# Patient Record
Sex: Male | Born: 1950
Health system: Southern US, Community
[De-identification: ages and names within clinical notes are randomized; demographics above are authoritative.]

## PROBLEM LIST (undated history)

## (undated) DIAGNOSIS — Z87442 Personal history of urinary calculi: Secondary | ICD-10-CM

## (undated) DIAGNOSIS — G709 Myoneural disorder, unspecified: Secondary | ICD-10-CM

## (undated) DIAGNOSIS — D649 Anemia, unspecified: Secondary | ICD-10-CM

## (undated) DIAGNOSIS — I10 Essential (primary) hypertension: Secondary | ICD-10-CM

## (undated) DIAGNOSIS — N400 Enlarged prostate without lower urinary tract symptoms: Secondary | ICD-10-CM

## (undated) DIAGNOSIS — F32A Depression, unspecified: Secondary | ICD-10-CM

## (undated) DIAGNOSIS — F329 Major depressive disorder, single episode, unspecified: Secondary | ICD-10-CM

## (undated) DIAGNOSIS — M1990 Unspecified osteoarthritis, unspecified site: Secondary | ICD-10-CM

## (undated) DIAGNOSIS — M25569 Pain in unspecified knee: Secondary | ICD-10-CM

## (undated) DIAGNOSIS — K219 Gastro-esophageal reflux disease without esophagitis: Secondary | ICD-10-CM

## (undated) DIAGNOSIS — F988 Other specified behavioral and emotional disorders with onset usually occurring in childhood and adolescence: Secondary | ICD-10-CM

## (undated) DIAGNOSIS — F419 Anxiety disorder, unspecified: Secondary | ICD-10-CM

## (undated) DIAGNOSIS — G4730 Sleep apnea, unspecified: Secondary | ICD-10-CM

## (undated) DIAGNOSIS — E119 Type 2 diabetes mellitus without complications: Secondary | ICD-10-CM

## (undated) HISTORY — DX: Other specified behavioral and emotional disorders with onset usually occurring in childhood and adolescence: F98.8

## (undated) HISTORY — PX: OTHER SURGICAL HISTORY: SHX169

## (undated) HISTORY — DX: Anxiety disorder, unspecified: F41.9

## (undated) HISTORY — PX: KNEE ARTHROSCOPY: SUR90

## (undated) HISTORY — PX: APPENDECTOMY: SHX54

## (undated) HISTORY — DX: Type 2 diabetes mellitus without complications: E11.9

## (undated) HISTORY — PX: JOINT REPLACEMENT: SHX530

## (undated) HISTORY — DX: Pain in unspecified knee: M25.569

## (undated) HISTORY — DX: Depression, unspecified: F32.A

## (undated) HISTORY — DX: Benign prostatic hyperplasia without lower urinary tract symptoms: N40.0

## (undated) HISTORY — PX: HERNIA REPAIR: SHX51

## (undated) HISTORY — PX: COLONOSCOPY W/ POLYPECTOMY: SHX1380

## (undated) HISTORY — DX: Sleep apnea, unspecified: G47.30

---
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
## 2018-02-09 LAB — HM COLONOSCOPY

## 2018-12-14 LAB — CBC AND DIFFERENTIAL
HCT: 41 (ref 41–53)
Hemoglobin: 14 (ref 13.5–17.5)
Platelets: 149 — AB (ref 150–399)
WBC: 12.1

## 2018-12-14 LAB — VITAMIN D 25 HYDROXY (VIT D DEFICIENCY, FRACTURES): Vit D, 25-Hydroxy: 20.1

## 2018-12-14 LAB — HM HIV SCREENING LAB: HM HIV Screening: NEGATIVE

## 2019-12-19 LAB — TSH: TSH: 2.21 (ref 0.41–5.90)

## 2019-12-19 LAB — BASIC METABOLIC PANEL
BUN: 27 — AB (ref 4–21)
CO2: 28 — AB (ref 13–22)
Chloride: 100 (ref 99–108)
Creatinine: 1.1 (ref 0.6–1.3)
Glucose: 140
Potassium: 4.1 (ref 3.4–5.3)
Sodium: 140 (ref 137–147)

## 2019-12-19 LAB — HEPATIC FUNCTION PANEL
ALT: 62 — AB (ref 10–40)
AST: 38 (ref 14–40)
Alkaline Phosphatase: 37 (ref 25–125)
Bilirubin, Total: 0.5

## 2019-12-19 LAB — LIPID PANEL
Cholesterol: 125 (ref 0–200)
HDL: 31 — AB (ref 35–70)
LDL Cholesterol: 60
Triglycerides: 353 — AB (ref 40–160)

## 2019-12-19 LAB — COMPREHENSIVE METABOLIC PANEL
Albumin: 4.6 (ref 3.5–5.0)
Calcium: 10.3 (ref 8.7–10.7)
GFR calc non Af Amer: 69

## 2019-12-19 LAB — MICROALBUMIN, URINE: Microalb, Ur: <0.7

## 2020-05-27 ENCOUNTER — Encounter: Payer: Self-pay | Admitting: Family Medicine

## 2020-05-27 ENCOUNTER — Ambulatory Visit (INDEPENDENT_AMBULATORY_CARE_PROVIDER_SITE_OTHER): Payer: Medicare Other | Admitting: Family Medicine

## 2020-05-27 ENCOUNTER — Other Ambulatory Visit: Payer: Self-pay

## 2020-05-27 VITALS — BP 120/63 | HR 76 | Temp 97.3°F | Resp 16 | Ht 71.0 in | Wt 314.2 lb

## 2020-05-27 DIAGNOSIS — E1136 Type 2 diabetes mellitus with diabetic cataract: Secondary | ICD-10-CM

## 2020-05-27 DIAGNOSIS — M25561 Pain in right knee: Secondary | ICD-10-CM

## 2020-05-27 DIAGNOSIS — M150 Primary generalized (osteo)arthritis: Secondary | ICD-10-CM

## 2020-05-27 DIAGNOSIS — M5136 Other intervertebral disc degeneration, lumbar region: Secondary | ICD-10-CM

## 2020-05-27 DIAGNOSIS — M8949 Other hypertrophic osteoarthropathy, multiple sites: Secondary | ICD-10-CM | POA: Diagnosis not present

## 2020-05-27 DIAGNOSIS — G8929 Other chronic pain: Secondary | ICD-10-CM

## 2020-05-27 DIAGNOSIS — M159 Polyosteoarthritis, unspecified: Secondary | ICD-10-CM | POA: Insufficient documentation

## 2020-05-27 DIAGNOSIS — E1169 Type 2 diabetes mellitus with other specified complication: Secondary | ICD-10-CM

## 2020-05-27 DIAGNOSIS — E785 Hyperlipidemia, unspecified: Secondary | ICD-10-CM

## 2020-05-27 DIAGNOSIS — Z794 Long term (current) use of insulin: Secondary | ICD-10-CM

## 2020-05-27 DIAGNOSIS — M25562 Pain in left knee: Secondary | ICD-10-CM | POA: Insufficient documentation

## 2020-05-27 DIAGNOSIS — Z87828 Personal history of other (healed) physical injury and trauma: Secondary | ICD-10-CM

## 2020-05-27 DIAGNOSIS — M51369 Other intervertebral disc degeneration, lumbar region without mention of lumbar back pain or lower extremity pain: Secondary | ICD-10-CM

## 2020-05-27 DIAGNOSIS — Z7689 Persons encountering health services in other specified circumstances: Secondary | ICD-10-CM

## 2020-05-27 NOTE — Progress Notes (Signed)
Subjective:    Patient ID: Eric Andrews, male    DOB: 02-May-1951, 69 y.o.   MRN: [id number]  Eric Andrews is a 69 y.o. male presenting on 05/27/2020 for Establish Care (Right knee painful-no strength in that leg)    HPI   Moved from [place] to [place] with wife and 3 daughters. He has retired.  Chronic Bilateral Knee Pain S/p L meniscus injury, he had an arthroscopic repair and removal of half of meniscus He is having advanced arthritis or complications on Left knee. Additionally gradual worsening Right knee pain, with osteoarthritis Chronic Right knee pain and weakness, but it is tolerable, and sometimes it can peak to a more severe pain and lasting a few. ALSO - Lumbar Spine L4-5 DDD / Osteoarthritis - He has had prior X-rays demonstrating this DDD. Taking Celebrex 200 BID Not on other NSAIDs He was taking Tylenol high doses, and it affected his liver, so he was reduced. He was followed by Orthopedic specialist for both knees and back Prior imaging and MRI of R knee.  - they recommended R knee surgery in past but he could not pursue this while he was working  CHRONIC DM, Type 2: Reports he has had chronic diabetes, was followed by Endocrinologist until 1 year ago. He has been stable on insulin medicines for 10+ years, he has been on different other short acting insulin. CBGs: no readings today Meds: Humalog TID SSI with meals, Lantus 76 units daily, Jardiance 25mg , Pioglitazone 15mg  - He would like to come off Jardiance due to cost. His A1c has been controlled Reports  good compliance. Tolerating well w/o side-effects Currently on ARB Denies hypoglycemia, polyuria, visual changes, numbness or tingling.   Health Maintenance:  Colon CA Screening: Last Colonoscopy done in [place], will request record, reportedly negative no polyps good for 10 years, was done 2 years ago aprox, no other fam history.    No flowsheet data found.  Past Medical History:  Diagnosis Date  . ADD  (attention deficit disorder)   . Anxiety   . Depression   . Diabetes mellitus without complication ([place])   . Enlarged prostate   . Knee pain   . Sleep apnea    Past Surgical History:  Procedure Laterality Date  . APPENDECTOMY    . HERNIA REPAIR     Left  . KNEE ARTHROSCOPY     Left   Social History   Socioeconomic History  . Marital status: Not on file    Spouse name: Not on file  . Number of children: Not on file  . Years of education: Not on file  . Highest education level: Not on file  Occupational History  . Not on file  Tobacco Use  . Smoking status: Never Smoker  . Smokeless tobacco: Never Used  Substance and Sexual Activity  . Alcohol use: Not Currently  . Drug use: Never  . Sexual activity: Not on file  Other Topics Concern  . Not on file  Social History Narrative  . Not on file   Social Determinants of Health   Financial Resource Strain:   . Difficulty of Paying Living Expenses:   Food Insecurity:   . Worried About Charity fundraiser in the Last Year:   . Arboriculturist in the Last Year:   Transportation Needs:   . Film/video editor (Medical):   Marland Kitchen Lack of Transportation (Non-Medical):   Physical Activity:   . Days of Exercise per Week:   .  Minutes of Exercise per Session:   Stress:   . Feeling of Stress :   Social Connections:   . Frequency of Communication with Friends and Family:   . Frequency of Social Gatherings with Friends and Family:   . Attends Religious Services:   . Active Member of Clubs or Organizations:   . Attends Archivist Meetings:   Marland Kitchen Marital Status:   Intimate Partner Violence:   . Fear of Current or Ex-Partner:   . Emotionally Abused:   Marland Kitchen Physically Abused:   . Sexually Abused:    Family History  Problem Relation Age of Onset  . Cancer Father        lung   Current Outpatient Medications on File Prior to Visit  Medication Sig  . aspirin 81 MG EC tablet Take by mouth.  . celecoxib (CELEBREX) 200 MG  capsule Take 200 mg by mouth 2 (two) times daily.  . cetirizine (ZYRTEC) 10 MG tablet Take by mouth.  . chlorthalidone (HYGROTON) 25 MG tablet Take 25 mg by mouth daily.  . cholecalciferol (VITAMIN D3) 25 MCG (1000 UNIT) tablet Take 1,000 Units by mouth daily.  . cyanocobalamin 1000 MCG tablet Take by mouth.  . fenofibrate micronized (LOFIBRA) 134 MG capsule Take by mouth.  . finasteride (PROSCAR) 5 MG tablet Take by mouth.  . insulin lispro (HUMALOG) 100 UNIT/ML injection Inject into the skin.  Marland Kitchen losartan (COZAAR) 50 MG tablet Take 50 mg by mouth daily.  . Omega-3 Fatty Acids (FISH OIL) 1000 MG CAPS Take by mouth in the morning and at bedtime.  . pioglitazone (ACTOS) 15 MG tablet Take 15 mg by mouth daily.  . propranolol (INDERAL) 20 MG tablet Take 20 mg by mouth daily.  . tamsulosin (FLOMAX) 0.4 MG CAPS capsule Take 0.4 mg by mouth.  . traZODone (DESYREL) 100 MG tablet Take 100 mg by mouth at bedtime.  Marland Kitchen venlafaxine XR (EFFEXOR-XR) 150 MG 24 hr capsule Take 150 mg by mouth daily with breakfast.  . atorvastatin (LIPITOR) 20 MG tablet Take 1 tablet (20 mg total) by mouth daily.  . hydrALAZINE (APRESOLINE) 100 MG tablet Take by mouth.  . insulin glargine (LANTUS) 100 UNIT/ML injection Inject into the skin.  . metFORMIN (GLUCOPHAGE-XR) 500 MG 24 hr tablet Take 2 tablets (1,000 mg total) by mouth daily with breakfast.  . SUMAtriptan (IMITREX) 100 MG tablet Take by mouth.   No current facility-administered medications on file prior to visit.    Review of Systems Per HPI unless specifically indicated above     Objective:    BP 120/63   Pulse 76   Temp (!) 97.3 F (36.3 C) (Temporal)   Resp 16   Ht 5\' 11"  (1.803 m)   Wt (!) 314 lb 3.2 oz (142.5 kg)   SpO2 98%   BMI 43.82 kg/m   Wt Readings from Last 3 Encounters:  05/27/20 (!) 314 lb 3.2 oz (142.5 kg)    Physical Exam Vitals and nursing note reviewed.  Constitutional:      General: He is not in acute distress.    Appearance:  He is well-developed. He is obese. He is not diaphoretic.     Comments: Well-appearing, comfortable, cooperative  HENT:     Head: Normocephalic and atraumatic.  Eyes:     General:        Right eye: No discharge.        Left eye: No discharge.     Conjunctiva/sclera: Conjunctivae normal.  Cardiovascular:  Rate and Rhythm: Normal rate.  Pulmonary:     Effort: Pulmonary effort is normal.  Skin:    General: Skin is warm and dry.     Findings: No erythema or rash.  Neurological:     Mental Status: He is alert and oriented to person, place, and time.  Psychiatric:        Behavior: Behavior normal.     Comments: Well groomed, good eye contact, normal speech and thoughts        No results found for this or any previous visit.    Assessment & Plan:   Problem List Items Addressed This Visit    Type 2 diabetes mellitus with other specified complication ([place]) - Primary   Relevant Medications   aspirin 81 MG EC tablet   insulin glargine (LANTUS) 100 UNIT/ML injection   insulin lispro (HUMALOG) 100 UNIT/ML injection   losartan (COZAAR) 50 MG tablet   pioglitazone (ACTOS) 15 MG tablet   metFORMIN (GLUCOPHAGE-XR) 500 MG 24 hr tablet   atorvastatin (LIPITOR) 20 MG tablet   Primary osteoarthritis involving multiple joints   Relevant Medications   aspirin 81 MG EC tablet   celecoxib (CELEBREX) 200 MG capsule   Other Relevant Orders   Ambulatory referral to Orthopedic Surgery   History of torn meniscus of left knee   Relevant Orders   Ambulatory referral to Orthopedic Surgery   DDD (degenerative disc disease), lumbar   Relevant Medications   aspirin 81 MG EC tablet   celecoxib (CELEBREX) 200 MG capsule   Other Relevant Orders   Ambulatory referral to Orthopedic Surgery   Chronic pain of right knee   Relevant Medications   aspirin 81 MG EC tablet   celecoxib (CELEBREX) 200 MG capsule   venlafaxine XR (EFFEXOR-XR) 150 MG 24 hr capsule   traZODone (DESYREL) 100 MG tablet    Other Relevant Orders   Ambulatory referral to Orthopedic Surgery   Cataract associated with type 2 diabetes mellitus (HCC)   Relevant Medications   aspirin 81 MG EC tablet   insulin glargine (LANTUS) 100 UNIT/ML injection   insulin lispro (HUMALOG) 100 UNIT/ML injection   losartan (COZAAR) 50 MG tablet   pioglitazone (ACTOS) 15 MG tablet   metFORMIN (GLUCOPHAGE-XR) 500 MG 24 hr tablet   atorvastatin (LIPITOR) 20 MG tablet    Other Visit Diagnoses    Encounter to establish care with new doctor          Request outside records from prior PCP and specialist from [place].  Update all meds Not due for reorder - he can notify us when ready  For DM Can HOLD Jardiance for now, continue insulin basal + bolus and Pioglitazone, asked him to look into Trulicity as option to see if cost/covered we can consider sample and start at next visit if interested - Due DM Foot / Eye - Request prior records, endocrinology if available. Future consider refer if indicated. - check A1c in 4 weeks.  For HLD Continue statin therapy, Fenofibrate, will request prior records and labs. Including lipid  #Orthopedic Bilateral R>L Knee Joint pain DJD, Back pain lumbar DDD L4-5 Continue Celebrex Advised may trial topical Voltaren OTC rx if indicated Refer to [facility] at this time, for consultation on R knee, may benefit form further injections or synvisc future reconsider surgery if indicated.  No orders of the defined types were placed in this encounter.    Follow up plan: Return in about 4 weeks (around 06/24/2020) for 4 week  follow-up Arthritis, Diabetes (Foot, Eye check), lab results.  Nobie Putnam, [facility] 05/27/2020, 4:27 PM

## 2020-05-27 NOTE — Patient Instructions (Addendum)
Thank you for coming to the office today.  Referral to Orthopedics for Right Knee Pain / Arthritis  They will call you within 1 week if not heard back you can call them to check status.  [facility] [place], [place]  [address] Phone: [phone]  TRY Topical Voltaren gel OTC as needed 2-3 times a day ONLY IF NEEDED  Keep taking Celebrex.  We will get your records from previous specialist.  If need medicines - call us and request meds.  DUE for FASTING BLOOD WORK (no food or drink after midnight before the lab appointment, only water or coffee without cream/sugar on the morning of)  SCHEDULE "Lab Only" visit in the morning at the clinic for lab draw in 3-4 WEEKS  - Make sure Lab Only appointment is at about 1 week before your next appointment, so that results will be available  For Lab Results, once available within 2-3 days of blood draw, you can can log in to MyChart online to view your results and a brief explanation. Also, we can discuss results at next follow-up visit.    Please schedule a Follow-up Appointment to: Return in about 4 weeks (around 06/24/2020) for 4 week follow-up Arthritis, Diabetes (Foot, Eye check), lab results.  If you have any other questions or concerns, please feel free to call the office or send a message through [place]. You may also schedule an earlier appointment if necessary.  Additionally, you may be receiving a survey about your experience at our office within a few days to 1 week by e-mail or mail. We value your feedback.  Eric Putnam, DO [place]

## 2020-05-28 ENCOUNTER — Encounter: Payer: Self-pay | Admitting: Family Medicine

## 2020-06-02 ENCOUNTER — Encounter: Payer: Self-pay | Admitting: Family Medicine

## 2020-06-02 NOTE — Addendum Note (Signed)
Addended by: [name] on: 06/02/2020 11:37 PM   Modules accepted: Orders

## 2020-06-06 ENCOUNTER — Encounter: Payer: Self-pay | Admitting: Family Medicine

## 2020-06-18 ENCOUNTER — Other Ambulatory Visit: Payer: Medicare Other

## 2020-06-18 ENCOUNTER — Telehealth: Payer: Self-pay

## 2020-06-18 ENCOUNTER — Other Ambulatory Visit: Payer: Self-pay

## 2020-06-18 DIAGNOSIS — E1169 Type 2 diabetes mellitus with other specified complication: Secondary | ICD-10-CM

## 2020-06-18 DIAGNOSIS — Z794 Long term (current) use of insulin: Secondary | ICD-10-CM | POA: Diagnosis not present

## 2020-06-18 DIAGNOSIS — E785 Hyperlipidemia, unspecified: Secondary | ICD-10-CM | POA: Diagnosis not present

## 2020-06-19 LAB — COMPREHENSIVE METABOLIC PANEL
AG Ratio: 2 (calc) (ref 1.0–2.5)
ALT: 61 U/L — ABNORMAL HIGH (ref 9–46)
AST: 39 U/L — ABNORMAL HIGH (ref 10–35)
Albumin: 4.1 g/dL (ref 3.6–5.1)
Alkaline phosphatase (APISO): 30 U/L — ABNORMAL LOW (ref 35–144)
BUN: 21 mg/dL (ref 7–25)
CO2: 30 mmol/L (ref 20–32)
Calcium: 9.3 mg/dL (ref 8.6–10.3)
Chloride: 100 mmol/L (ref 98–110)
Creat: 1.08 mg/dL (ref 0.70–1.25)
Globulin: 2.1 g/dL (calc) (ref 1.9–3.7)
Glucose, Bld: 206 mg/dL — ABNORMAL HIGH (ref 65–99)
Potassium: 4.2 mmol/L (ref 3.5–5.3)
Sodium: 138 mmol/L (ref 135–146)
Total Bilirubin: 0.6 mg/dL (ref 0.2–1.2)
Total Protein: 6.2 g/dL (ref 6.1–8.1)

## 2020-06-19 LAB — CBC WITH DIFFERENTIAL/PLATELET
Absolute Monocytes: 520 cells/uL (ref 200–950)
Basophils Absolute: 70 cells/uL (ref 0–200)
Basophils Relative: 1.4 %
Eosinophils Absolute: 310 cells/uL (ref 15–500)
Eosinophils Relative: 6.2 %
HCT: 49.9 % (ref 38.5–50.0)
Hemoglobin: 16 g/dL (ref 13.2–17.1)
Lymphs Abs: 1755 cells/uL (ref 850–3900)
MCH: 28.8 pg (ref 27.0–33.0)
MCHC: 32.1 g/dL (ref 32.0–36.0)
MCV: 89.7 fL (ref 80.0–100.0)
MPV: 10.9 fL (ref 7.5–12.5)
Monocytes Relative: 10.4 %
Neutro Abs: 2345 cells/uL (ref 1500–7800)
Neutrophils Relative %: 46.9 %
Platelets: 137 10*3/uL — ABNORMAL LOW (ref 140–400)
RBC: 5.56 10*6/uL (ref 4.20–5.80)
RDW: 13.4 % (ref 11.0–15.0)
Total Lymphocyte: 35.1 %
WBC: 5 10*3/uL (ref 3.8–10.8)

## 2020-06-19 LAB — HEMOGLOBIN A1C
Hgb A1c MFr Bld: 7.9 % of total Hgb — ABNORMAL HIGH (ref <5.7)
Mean Plasma Glucose: 180 (calc)
eAG (mmol/L): 10 (calc)

## 2020-06-19 LAB — LIPID PANEL
Cholesterol: 114 mg/dL (ref <200)
HDL: 34 mg/dL — ABNORMAL LOW (ref >=40)
LDL Cholesterol (Calc): 54 mg/dL (calc)
Non-HDL Cholesterol (Calc): 80 mg/dL (calc) (ref <130)
Total CHOL/HDL Ratio: 3.4 (calc) (ref <5.0)
Triglycerides: 181 mg/dL — ABNORMAL HIGH (ref <150)

## 2020-06-19 NOTE — Telephone Encounter (Signed)
Open in error

## 2020-06-21 DIAGNOSIS — Z9889 Other specified postprocedural states: Secondary | ICD-10-CM | POA: Diagnosis not present

## 2020-06-21 DIAGNOSIS — M170 Bilateral primary osteoarthritis of knee: Secondary | ICD-10-CM | POA: Diagnosis not present

## 2020-06-25 ENCOUNTER — Ambulatory Visit (INDEPENDENT_AMBULATORY_CARE_PROVIDER_SITE_OTHER): Payer: Medicare Other | Admitting: Family Medicine

## 2020-06-25 ENCOUNTER — Other Ambulatory Visit: Payer: Self-pay

## 2020-06-25 ENCOUNTER — Encounter: Payer: Self-pay | Admitting: Family Medicine

## 2020-06-25 VITALS — BP 131/70 | HR 64 | Temp 96.8°F | Resp 16 | Ht 71.0 in | Wt 315.8 lb

## 2020-06-25 DIAGNOSIS — F331 Major depressive disorder, recurrent, moderate: Secondary | ICD-10-CM | POA: Diagnosis not present

## 2020-06-25 DIAGNOSIS — E1136 Type 2 diabetes mellitus with diabetic cataract: Secondary | ICD-10-CM

## 2020-06-25 DIAGNOSIS — E1169 Type 2 diabetes mellitus with other specified complication: Secondary | ICD-10-CM

## 2020-06-25 DIAGNOSIS — M8949 Other hypertrophic osteoarthropathy, multiple sites: Secondary | ICD-10-CM | POA: Diagnosis not present

## 2020-06-25 DIAGNOSIS — F411 Generalized anxiety disorder: Secondary | ICD-10-CM | POA: Insufficient documentation

## 2020-06-25 DIAGNOSIS — M159 Polyosteoarthritis, unspecified: Secondary | ICD-10-CM

## 2020-06-25 DIAGNOSIS — Z794 Long term (current) use of insulin: Secondary | ICD-10-CM

## 2020-06-25 DIAGNOSIS — Z6841 Body Mass Index (BMI) 40.0 and over, adult: Secondary | ICD-10-CM

## 2020-06-25 MED ORDER — OZEMPIC (0.25 OR 0.5 MG/DOSE) 2 MG/1.5ML ~~LOC~~ SOPN: 0.2500 mg | PEN_INJECTOR | SUBCUTANEOUS | 0 refills | Status: DC

## 2020-06-25 NOTE — Progress Notes (Signed)
Subjective:    Patient ID: Eric Andrews, male    DOB: 1951-05-17, 69 y.o.   MRN: [id number]  Eric Andrews is a 69 y.o. male presenting on 06/25/2020 for Diabetes   HPI   Follow-up Chronic Bilateral Knee Pain / Osteoarthritis Interval update, he has seen [facility] on 06/21/20, reviewed chart, had x-rays performed and reviewed at that time, he pursued R knee cortisone injection at that time. - He has complicated L knee history s/p arthroscopic meniscus repair in past ALSO - Lumbar Spine L4-5 DDD / Osteoarthritis - He has had prior X-rays demonstrating this DDD. Taking Celebrex 200 BID Not on other NSAIDs Prior imaging and MRI of R knee.  - they recommended R knee surgery in past but he could not pursue this while he was working  CHRONIC DM, Type 2 Cataracts Morbid Obesity BMI >44 Last visit with me 05/27/20, discussed med options to change current treatment plan He has continued Jardiance 25mg  did not stop it. Contacted insurance and the Trulicity was higher cost due to deductible $50 - admits overeating at times. He swapped from Lantus to Toujeo Meds: Humalog TID SSI with meals, Toujeo 50 units daily, Jardiance 25mg , Pioglitazone 15mg  Reports  good compliance. Tolerating well w/o side-effects Currently on ARB Denies hypoglycemia, polyuria, visual changes, numbness or tingling.  HYPERLIPIDEMIA: Elevated LFTs elevated LFT, January 2021 results reviewed today when patient brought copy of labs Lab in 06/2020 still shows mild elevated AST ALT elevation Lab review shows mild elevated Triglycerides 180s - Currently taking Atorvastatin 20mg , Fenofibrate 134, Omega 3, tolerating well without side effects or myalgias   Health Maintenance:  Colon CA Screening: Last Colonoscopy done in [place] at [facility] - reviewed/scanned report, mild sigmoid diverticulosis, no polyps, done in 02/09/18, next due repeat 5 years, 02/2023    Depression screen PHQ 2/9 06/25/2020    Decreased Interest 3  Down, Depressed, Hopeless 3  PHQ - 2 Score 6  Altered sleeping 1  Tired, decreased energy 3  Change in appetite 3  Feeling bad or failure about yourself  3  Trouble concentrating 2  Moving slowly or fidgety/restless 1  Suicidal thoughts 0  PHQ-9 Score 19  Difficult doing work/chores Very difficult   GAD 7 : Generalized Anxiety Score 06/25/2020  Nervous, Anxious, on Edge 2  Control/stop worrying 3  Worry too much - different things 3  Trouble relaxing 3  Restless 1  Easily annoyed or irritable 1  Afraid - awful might happen 2  Total GAD 7 Score 15  Anxiety Difficulty Somewhat difficult     Social History   Tobacco Use  . Smoking status: Never Smoker  . Smokeless tobacco: Never Used  Substance Use Topics  . Alcohol use: Not Currently  . Drug use: Never    Review of Systems Per HPI unless specifically indicated above     Objective:    BP 131/70   Pulse 64   Temp (!) 96.8 F (36 C)   Resp 16   Ht 5\' 11"  (1.803 m)   Wt (!) 315 lb 12.8 oz (143.2 kg)   SpO2 99%   BMI 44.05 kg/m   Wt Readings from Last 3 Encounters:  06/25/20 (!) 315 lb 12.8 oz (143.2 kg)  05/27/20 (!) 314 lb 3.2 oz (142.5 kg)    Physical Exam Vitals and nursing note reviewed.  Constitutional:      General: He is not in acute distress.    Appearance: He is well-developed. He  is obese. He is not diaphoretic.     Comments: Well-appearing, comfortable, cooperative  HENT:     Head: Normocephalic and atraumatic.  Eyes:     General:        Right eye: No discharge.        Left eye: No discharge.     Conjunctiva/sclera: Conjunctivae normal.  Neck:     Thyroid: No thyromegaly.  Cardiovascular:     Rate and Rhythm: Normal rate and regular rhythm.     Heart sounds: Normal heart sounds. No murmur heard.   Pulmonary:     Effort: Pulmonary effort is normal. No respiratory distress.     Breath sounds: Normal breath sounds. No wheezing or rales.  Musculoskeletal:         General: Normal range of motion.     Cervical back: Normal range of motion and neck supple.  Lymphadenopathy:     Cervical: No cervical adenopathy.  Skin:    General: Skin is warm and dry.     Findings: No erythema or rash.  Neurological:     Mental Status: He is alert and oriented to person, place, and time.  Psychiatric:        Behavior: Behavior normal.     Comments: Well groomed, good eye contact, normal speech and thoughts      Diabetic Foot Exam - Simple   Simple Foot Form Diabetic Foot exam was performed with the following findings: Yes 06/25/2020 10:39 AM  Visual Inspection No deformities, no ulcerations, no other skin breakdown bilaterally: Yes Sensation Testing Intact to touch and monofilament testing bilaterally: Yes Pulse Check Posterior Tibialis and Dorsalis pulse intact bilaterally: Yes Comments      Results for orders placed or performed in visit on 06/18/20  Lipid panel  Result Value Ref Range   Cholesterol 114 <200 mg/dL   HDL 34 (L) > OR = 40 mg/dL   Triglycerides 181 (H) <150 mg/dL   LDL Cholesterol (Calc) 54 mg/dL (calc)   Total CHOL/HDL Ratio 3.4 <5.0 (calc)   Non-HDL Cholesterol (Calc) 80 <130 mg/dL (calc)  Comprehensive metabolic panel  Result Value Ref Range   Glucose, Bld 206 (H) 65 - 99 mg/dL   BUN 21 7 - 25 mg/dL   Creat 1.08 0.70 - 1.25 mg/dL   BUN/Creatinine Ratio NOT APPLICABLE 6 - 22 (calc)   Sodium 138 135 - 146 mmol/L   Potassium 4.2 3.5 - 5.3 mmol/L   Chloride 100 98 - 110 mmol/L   CO2 30 20 - 32 mmol/L   Calcium 9.3 8.6 - 10.3 mg/dL   Total Protein 6.2 6.1 - 8.1 g/dL   Albumin 4.1 3.6 - 5.1 g/dL   Globulin 2.1 1.9 - 3.7 g/dL (calc)   AG Ratio 2.0 1.0 - 2.5 (calc)   Total Bilirubin 0.6 0.2 - 1.2 mg/dL   Alkaline phosphatase (APISO) 30 (L) 35 - 144 U/L   AST 39 (H) 10 - 35 U/L   ALT 61 (H) 9 - 46 U/L  Hemoglobin A1c  Result Value Ref Range   Hgb A1c MFr Bld 7.9 (H) <5.7 % of total Hgb   Mean Plasma Glucose 180 (calc)   eAG  (mmol/L) 10.0 (calc)  CBC with Differential/Platelet  Result Value Ref Range   WBC 5.0 3.8 - 10.8 Thousand/uL   RBC 5.56 4.20 - 5.80 Million/uL   Hemoglobin 16.0 13.2 - 17.1 g/dL   HCT 49.9 38 - 50 %   MCV 89.7 80.0 - 100.0 fL  MCH 28.8 27.0 - 33.0 pg   MCHC 32.1 32.0 - 36.0 g/dL   RDW 13.4 11.0 - 15.0 %   Platelets 137 (L) 140 - 400 Thousand/uL   MPV 10.9 7.5 - 12.5 fL   Neutro Abs 2,345 1,500 - 7,800 cells/uL   Lymphs Abs 1,755 850 - 3,900 cells/uL   Absolute Monocytes 520 200 - 950 cells/uL   Eosinophils Absolute 310 15 - 500 cells/uL   Basophils Absolute 70 0 - 200 cells/uL   Neutrophils Relative % 46.9 %   Total Lymphocyte 35.1 %   Monocytes Relative 10.4 %   Eosinophils Relative 6.2 %   Basophils Relative 1.4 %      Assessment & Plan:   Problem List Items Addressed This Visit    Type 2 diabetes mellitus with other specified complication ([place]) - Primary    Nearly controlled, F6E 7.9 Complications - cataracts, hyperlipidemia, obesity, depression-  increases risk of future cardiovascular complications   Plan:  1. Discussion on med regimen today, ultimately advised my goal is to titrate him down or off insulin and onto newer med agents such as GLP1 - Start Ozempic sample 0.25mg  x 4 week then 0.5mg  x 2 week, demo reviewed, he has higher deductible otherwise should be covered, will order rx when 3-4 weeks in if he is doing well. He can notify office. - Continue Jardiance 25mg  daily, Pioglitazone 15mg  daily, Metformin XR 1000mg  BID (500 x 2) - Continue Insulin regimen Toujeo / SSI Humalog. He should REDUCE basal Toujeo down from 50 to 40 u daily now, at start of ozempic. Counseling he may go down approximately 10 per month as he increases Ozempic and if he is doing well. May reduce SSI according to sugars 2. Encourage improved lifestyle - low carb, low sugar diet, reduce portion size, continue improving regular exercise 3. Check CBG, bring log to next visit for review 4.  Continue ASA, ARB, Statin 5. DM Foot exam done today / Advised to schedule DM ophtho exam, send record 6. Follow-up 3 months       Relevant Medications   OZEMPIC, 0.25 OR 0.5 MG/DOSE, 2 MG/1.5ML SOPN   empagliflozin (JARDIANCE) 25 MG TABS tablet   Primary osteoarthritis involving multiple joints    Followed by [facility] Recent R knee injection Keep following up with them for ortho concerns including lumbar spine      Morbid obesity with BMI of 40.0-44.9, adult (HCC)    Encourage weight loss      Relevant Medications   OZEMPIC, 0.25 OR 0.5 MG/DOSE, 2 MG/1.5ML SOPN   empagliflozin (JARDIANCE) 25 MG TABS tablet   Major depressive disorder, recurrent, moderate (HCC)    Chronic problems. Episodic worsening, chronic recurrent moderate Elevated PHQ/GAD scores today Continue current plan with Venlafaxine, Trazodone Follow-up sooner if interested in adjusting mood medication, we can consider mental health referral as well      GAD (generalized anxiety disorder)   Cataract associated with type 2 diabetes mellitus ([place])    Complication of P3IR Advised to schedule DM Eye exam, handout printed      Relevant Medications   OZEMPIC, 0.25 OR 0.5 MG/DOSE, 2 MG/1.5ML SOPN   empagliflozin (JARDIANCE) 25 MG TABS tablet      Meds ordered this encounter  Medications  . OZEMPIC, 0.25 OR 0.5 MG/DOSE, 2 MG/1.5ML SOPN    Sig: Inject 0.1875 mLs (0.25 mg total) into the skin once a week. For first 4 weeks. Then increase dose to 0.5mg  weekly  Dispense:  1 pen    Refill:  0      Follow up plan: Return in about 3 months (around 09/25/2020) for 3 month DM A1c, med adjust ozempic.   Nobie Putnam, [facility] 06/25/2020, 10:39 AM

## 2020-06-25 NOTE — Patient Instructions (Addendum)
Thank you for coming to the office today.  Call insurance find cost and coverage of the following   1. Ozempic (Semaglutide injection) - start 0.25mg  weekly for 4 weeks then increase to 0.5mg  weekly - This one has best benefit of weight loss and reducing Cardiovascular events   3 benefits - 1 significantly reduced A1c sugar, and may be able to reduce or stop metformin in future - 2 reduced appetite and weight loss with good results - 3 cardiovascular risk reduction, less likely to have heart attack/stroke   Free Sample Ozempic 6 week supply, first 4 weeks 0.25mg , then week 5-6 will be 0.5,   Call or send mychart messge in 1 month to get new rx Ozempic 0.5mg  weekly injection.  Reduce Toujeo insulin about 10 units from 50 to 40 when you start, then again every month if doing well by about 10 units.  You can reduce Humalog as you need based on sugars, will likely have to reduce in future.  We can see you in 3-4 months after to see how it is going  Recent Labs    06/18/20 0857  HGBA1C 7.9*     Your provider would like to you have your annual eye exam. Please contact your current eye doctor or here are some good options for you to contact.   [facility]   Address: [address] [place], [place] [address] Phone: [phone]  Website: [email or web address] [address], [place], [place] [address] Phone: [phone] [email or web address]  [facility]  Address: [place], [place], [place] [address] Phone: [phone]   [facility] [address] [place], [place] [address] Phone: [phone]  [facility] Address: [place], [place], [place] [address]  Phone: [phone]   Please schedule a Follow-up Appointment to: Return in about 3 months (around 09/25/2020) for 3 month DM A1c, med adjust ozempic.  If you have any other questions or concerns, please feel free to call the office or send a message through  [place]. You may also schedule an earlier appointment if necessary.  Additionally, you may be receiving a survey about your experience at our office within a few days to 1 week by e-mail or mail. We value your feedback.  Nobie Putnam, DO [place]

## 2020-06-26 DIAGNOSIS — Z6841 Body Mass Index (BMI) 40.0 and over, adult: Secondary | ICD-10-CM | POA: Insufficient documentation

## 2020-06-26 NOTE — Assessment & Plan Note (Signed)
Complication of M3VK Advised to schedule DM Eye exam, handout printed

## 2020-06-26 NOTE — Assessment & Plan Note (Addendum)
Chronic problems. Episodic worsening, chronic recurrent moderate Elevated PHQ/GAD scores today Continue current plan with Venlafaxine, Trazodone Follow-up sooner if interested in adjusting mood medication, we can consider mental health referral as well

## 2020-06-26 NOTE — Assessment & Plan Note (Signed)
Followed by [facility] Recent R knee injection Keep following up with them for ortho concerns including lumbar spine

## 2020-06-26 NOTE — Assessment & Plan Note (Signed)
Encourage weight loss. 

## 2020-06-26 NOTE — Assessment & Plan Note (Signed)
Nearly controlled, O4H 7.9 Complications - cataracts, hyperlipidemia, obesity, depression-  increases risk of future cardiovascular complications   Plan:  1. Discussion on med regimen today, ultimately advised my goal is to titrate him down or off insulin and onto newer med agents such as GLP1 - Start Ozempic sample 0.25mg  x 4 week then 0.5mg  x 2 week, demo reviewed, he has higher deductible otherwise should be covered, will order rx when 3-4 weeks in if he is doing well. He can notify office. - Continue Jardiance 25mg  daily, Pioglitazone 15mg  daily, Metformin XR 1000mg  BID (500 x 2) - Continue Insulin regimen Toujeo / SSI Humalog. He should REDUCE basal Toujeo down from 50 to 40 u daily now, at start of ozempic. Counseling he may go down approximately 10 per month as he increases Ozempic and if he is doing well. May reduce SSI according to sugars 2. Encourage improved lifestyle - low carb, low sugar diet, reduce portion size, continue improving regular exercise 3. Check CBG, bring log to next visit for review 4. Continue ASA, ARB, Statin 5. DM Foot exam done today / Advised to schedule DM ophtho exam, send record 6. Follow-up 3 months

## 2020-06-27 ENCOUNTER — Encounter: Payer: Self-pay | Admitting: Family Medicine

## 2020-07-01 ENCOUNTER — Other Ambulatory Visit: Payer: Self-pay | Admitting: Family Medicine

## 2020-07-01 DIAGNOSIS — R350 Frequency of micturition: Secondary | ICD-10-CM

## 2020-07-01 DIAGNOSIS — N401 Enlarged prostate with lower urinary tract symptoms: Secondary | ICD-10-CM

## 2020-07-01 MED ORDER — FINASTERIDE 5 MG PO TABS: 5.0000 mg | ORAL_TABLET | Freq: Every evening | ORAL | 3 refills | Status: DC

## 2020-07-01 NOTE — Telephone Encounter (Signed)
Requested medication (s) are due for refill today: yes  Requested medication (s) are on the active medication list: yes  Future visit scheduled: yes  Notes to clinic:  review for refill Last filled by historical provider   Requested Prescriptions  Pending Prescriptions Disp Refills   finasteride (PROSCAR) 5 MG tablet      Sig: Take 1 tablet (5 mg total) by mouth every evening.      Urology: 5-alpha Reductase Inhibitors Passed - 07/01/2020  3:25 PM      Passed - Valid encounter within last 12 months    Recent Outpatient Visits           6 days ago Type 2 diabetes mellitus with other specified complication, with long-term current use of insulin [facility])   [place], DO   1 month ago Type 2 diabetes mellitus with other specified complication, with long-term current use of insulin ([place])   [facility] [name], [name], DO       Future Appointments             In 1 week [facility], [facility]

## 2020-07-01 NOTE — Addendum Note (Signed)
Addended by: [name] on: 07/01/2020 05:59 PM   Modules accepted: Orders

## 2020-07-01 NOTE — Telephone Encounter (Signed)
PT need a refill  finasteride (PROSCAR) 5 MG tablet [[id number]]  [id number] - [address], [place] AT [facility] OF [address]  [place] [address]  Phone: [phone] Fax: [phone]

## 2020-07-01 NOTE — Addendum Note (Signed)
Addended by: Jefferson Fuel on: 07/01/2020 03:25 PM   Modules accepted: Orders

## 2020-07-09 ENCOUNTER — Ambulatory Visit (INDEPENDENT_AMBULATORY_CARE_PROVIDER_SITE_OTHER): Payer: Medicare Other

## 2020-07-09 VITALS — Ht 71.0 in | Wt 312.0 lb

## 2020-07-09 DIAGNOSIS — Z0000 Encounter for general adult medical examination without abnormal findings: Secondary | ICD-10-CM

## 2020-07-09 NOTE — Patient Instructions (Signed)
Eric Andrews , Thank you for taking time to come for your Medicare Wellness Visit. I appreciate your ongoing commitment to your health goals. Please review the following plan we discussed and let me know if I can assist you in the future.   Screening recommendations/referrals: Colonoscopy: completed 02/09/2018 Recommended yearly ophthalmology/optometry visit for glaucoma screening and checkup Recommended yearly dental visit for hygiene and checkup  Vaccinations: Influenza vaccine: due Pneumococcal vaccine: due Tdap vaccine: completed 2/10/20219 Shingles vaccine: discussed   Covid-19: 02/09/2020, 03/13/2020  Advanced directives: Advance directive discussed with you today. Even though you declined this today please call our office should you change your mind and we can give you the proper paperwork for you to fill out.   Conditions/risks identified: none  Next appointment: Follow up in one year for your annual wellness visit.   Preventive Care 68 Years and Older, Male Preventive care refers to lifestyle choices and visits with your health care provider that can promote health and wellness. What does preventive care include?  A yearly physical exam. This is also called an annual well check.  Dental exams once or twice a year.  Routine eye exams. Ask your health care provider how often you should have your eyes checked.  Personal lifestyle choices, including:  Daily care of your teeth and gums.  Regular physical activity.  Eating a healthy diet.  Avoiding tobacco and drug use.  Limiting alcohol use.  Practicing safe sex.  Taking low doses of aspirin every day.  Taking vitamin and mineral supplements as recommended by your health care provider. What happens during an annual well check? The services and screenings done by your health care provider during your annual well check will depend on your age, overall health, lifestyle risk factors, and family history of  disease. Counseling  Your health care provider may ask you questions about your:  Alcohol use.  Tobacco use.  Drug use.  Emotional well-being.  Home and relationship well-being.  Sexual activity.  Eating habits.  History of falls.  Memory and ability to understand (cognition).  Work and work Statistician. Screening  You may have the following tests or measurements:  Height, weight, and BMI.  Blood pressure.  Lipid and cholesterol levels. These may be checked every 5 years, or more frequently if you are over 13 years old.  Skin check.  Lung cancer screening. You may have this screening every year starting at age 68 if you have a 30-pack-year history of smoking and currently smoke or have quit within the past 15 years.  Fecal occult blood test (FOBT) of the stool. You may have this test every year starting at age 68.  Flexible sigmoidoscopy or colonoscopy. You may have a sigmoidoscopy every 5 years or a colonoscopy every 10 years starting at age 24.  Prostate cancer screening. Recommendations will vary depending on your family history and other risks.  Hepatitis C blood test.  Hepatitis B blood test.  Sexually transmitted disease (STD) testing.  Diabetes screening. This is done by checking your blood sugar (glucose) after you have not eaten for a while (fasting). You may have this done every 1-3 years.  Abdominal aortic aneurysm (AAA) screening. You may need this if you are a current or former smoker.  Osteoporosis. You may be screened starting at age 39 if you are at high risk. Talk with your health care provider about your test results, treatment options, and if necessary, the need for more tests. Vaccines  Your health care provider may  recommend certain vaccines, such as:  Influenza vaccine. This is recommended every year.  Tetanus, diphtheria, and acellular pertussis (Tdap, Td) vaccine. You may need a Td booster every 10 years.  Zoster vaccine. You may  need this after age 36.  Pneumococcal 13-valent conjugate (PCV13) vaccine. One dose is recommended after age 63.  Pneumococcal polysaccharide (PPSV23) vaccine. One dose is recommended after age 2. Talk to your health care provider about which screenings and vaccines you need and how often you need them. This information is not intended to replace advice given to you by your health care provider. Make sure you discuss any questions you have with your health care provider. Document Released: 12/20/2015 Document Revised: 08/12/2016 Document Reviewed: 09/24/2015 Elsevier Interactive Patient Education  2017 [place] Prevention in the Home Falls can cause injuries. They can happen to people of all ages. There are many things you can do to make your home safe and to help prevent falls. What can I do on the outside of my home?  Regularly fix the edges of walkways and driveways and fix any cracks.  Remove anything that might make you trip as you walk through a door, such as a raised step or threshold.  Trim any bushes or trees on the path to your home.  Use bright outdoor lighting.  Clear any walking paths of anything that might make someone trip, such as rocks or tools.  Regularly check to see if handrails are loose or broken. Make sure that both sides of any steps have handrails.  Any raised decks and porches should have guardrails on the edges.  Have any leaves, snow, or ice cleared regularly.  Use sand or salt on walking paths during winter.  Clean up any spills in your garage right away. This includes oil or grease spills. What can I do in the bathroom?  Use night lights.  Install grab bars by the toilet and in the tub and shower. Do not use towel bars as grab bars.  Use non-skid mats or decals in the tub or shower.  If you need to sit down in the shower, use a plastic, non-slip stool.  Keep the floor dry. Clean up any water that spills on the floor as soon as it  happens.  Remove soap buildup in the tub or shower regularly.  Attach bath mats securely with double-sided non-slip rug tape.  Do not have throw rugs and other things on the floor that can make you trip. What can I do in the bedroom?  Use night lights.  Make sure that you have a light by your bed that is easy to reach.  Do not use any sheets or blankets that are too big for your bed. They should not hang down onto the floor.  Have a firm chair that has side arms. You can use this for support while you get dressed.  Do not have throw rugs and other things on the floor that can make you trip. What can I do in the kitchen?  Clean up any spills right away.  Avoid walking on wet floors.  Keep items that you use a lot in easy-to-reach places.  If you need to reach something above you, use a strong step stool that has a grab bar.  Keep electrical cords out of the way.  Do not use floor polish or wax that makes floors slippery. If you must use wax, use non-skid floor wax.  Do not have throw rugs and  other things on the floor that can make you trip. What can I do with my stairs?  Do not leave any items on the stairs.  Make sure that there are handrails on both sides of the stairs and use them. Fix handrails that are broken or loose. Make sure that handrails are as long as the stairways.  Check any carpeting to make sure that it is firmly attached to the stairs. Fix any carpet that is loose or worn.  Avoid having throw rugs at the top or bottom of the stairs. If you do have throw rugs, attach them to the floor with carpet tape.  Make sure that you have a light switch at the top of the stairs and the bottom of the stairs. If you do not have them, ask someone to add them for you. What else can I do to help prevent falls?  Wear shoes that:  Do not have high heels.  Have rubber bottoms.  Are comfortable and fit you well.  Are closed at the toe. Do not wear sandals.  If you  use a stepladder:  Make sure that it is fully opened. Do not climb a closed stepladder.  Make sure that both sides of the stepladder are locked into place.  Ask someone to hold it for you, if possible.  Clearly mark and make sure that you can see:  Any grab bars or handrails.  First and last steps.  Where the edge of each step is.  Use tools that help you move around (mobility aids) if they are needed. These include:  Canes.  Walkers.  Scooters.  Crutches.  Turn on the lights when you go into a dark area. Replace any light bulbs as soon as they burn out.  Set up your furniture so you have a clear path. Avoid moving your furniture around.  If any of your floors are uneven, fix them.  If there are any pets around you, be aware of where they are.  Review your medicines with your doctor. Some medicines can make you feel dizzy. This can increase your chance of falling. Ask your doctor what other things that you can do to help prevent falls. This information is not intended to replace advice given to you by your health care provider. Make sure you discuss any questions you have with your health care provider. Document Released: 09/19/2009 Document Revised: 04/30/2016 Document Reviewed: 12/28/2014 Elsevier Interactive Patient Education  2017 [employer].

## 2020-07-09 NOTE — Progress Notes (Signed)
I connected with Eric Andrews today by telephone and verified that I am speaking with the correct person using two identifiers. Location patient: home Location provider: work Persons participating in the virtual visit: Ikenna Arnold, Glenna Durand LPN   I discussed the limitations, risks, security and privacy concerns of performing an evaluation and management service by telephone and the availability of in person appointments. I also discussed with the patient that there may be a patient responsible charge related to this service. The patient expressed understanding and verbally consented to this telephonic visit.    Interactive audio and video telecommunications were attempted between this provider and patient, however failed, due to patient having technical difficulties OR patient did not have access to video capability.  We continued and completed visit with audio only.    Vital signs may be patient reported or missing.   Subjective:   Eric Andrews is a 69 y.o. male who presents for Medicare Annual/Subsequent preventive examination.  Review of Systems     Cardiac Risk Factors include: advanced age (>9men, >1 women);diabetes mellitus;male gender;hypertension;obesity (BMI >30kg/m2);sedentary lifestyle     Objective:    Today's Vitals   07/09/20 0936 07/09/20 0937  Weight: (!) 312 lb (141.5 kg)   Height: 5\' 11"  (1.803 m)   PainSc:  1    Body mass index is 43.52 kg/m.  Advanced Directives 07/09/2020  Does Patient Have a Medical Advance Directive? No    Current Medications (verified) Outpatient Encounter Medications as of 07/09/2020  Medication Sig  . atorvastatin (LIPITOR) 20 MG tablet Take 1 tablet (20 mg total) by mouth daily.  . celecoxib (CELEBREX) 200 MG capsule Take 200 mg by mouth 2 (two) times daily.  . cetirizine (ZYRTEC) 10 MG tablet Take by mouth.  . chlorthalidone (HYGROTON) 25 MG tablet Take 25 mg by mouth daily.  . cholecalciferol (VITAMIN D3) 25 MCG (1000 UNIT)  tablet Take 1,000 Units by mouth every other day.  . empagliflozin (JARDIANCE) 25 MG TABS tablet Take 25 mg by mouth daily.  . fenofibrate micronized (LOFIBRA) 134 MG capsule Take by mouth.  . finasteride (PROSCAR) 5 MG tablet Take 1 tablet (5 mg total) by mouth every evening.  . hydrALAZINE (APRESOLINE) 100 MG tablet Take by mouth.  . Insulin Glargine (TOUJEO SOLOSTAR [place]) Inject 50 Units into the skin every evening.  . insulin lispro (HUMALOG) 100 UNIT/ML injection Inject 13 Units into the skin 2 (two) times daily with a meal.  . losartan (COZAAR) 50 MG tablet Take 50 mg by mouth daily.  . metFORMIN (GLUCOPHAGE-XR) 500 MG 24 hr tablet Take 1,000 mg by mouth 2 (two) times daily with a meal.  . Omega-3 Fatty Acids (FISH OIL) 1000 MG CAPS Take by mouth in the morning and at bedtime.  Marland Kitchen OZEMPIC, 0.25 OR 0.5 MG/DOSE, 2 MG/1.5ML SOPN Inject 0.1875 mLs (0.25 mg total) into the skin once a week. For first 4 weeks. Then increase dose to 0.5mg  weekly  . pioglitazone (ACTOS) 15 MG tablet Take 15 mg by mouth daily.  . propranolol (INDERAL) 20 MG tablet Take 20 mg by mouth daily.  . tamsulosin (FLOMAX) 0.4 MG CAPS capsule Take 0.4 mg by mouth at bedtime.  . traZODone (DESYREL) 100 MG tablet Take 100 mg by mouth at bedtime.  Marland Kitchen venlafaxine XR (EFFEXOR-XR) 150 MG 24 hr capsule Take 150 mg by mouth daily with breakfast.  . aspirin 81 MG EC tablet Take by mouth. (Patient not taking: Reported on 07/09/2020)  . cyanocobalamin 1000 MCG  tablet Take by mouth. (Patient not taking: Reported on 07/09/2020)  . SUMAtriptan (IMITREX) 100 MG tablet Take by mouth. (Patient not taking: Reported on 07/09/2020)   No facility-administered encounter medications on file as of 07/09/2020.    Allergies (verified) Dust mite extract   History: Past Medical History:  Diagnosis Date  . ADD (attention deficit disorder)   . Anxiety   . Depression   . Enlarged prostate   . Knee pain   . Sleep apnea    Past Surgical History:    Procedure Laterality Date  . APPENDECTOMY    . HERNIA REPAIR     Left  . KNEE ARTHROSCOPY     Left   Family History  Problem Relation Age of Onset  . Heart attack Mother 61  . Lung cancer Father   . Alzheimer's disease Brother   . Heart attack Maternal Grandfather 80   Social History   Socioeconomic History  . Marital status: Married    Spouse name: Not on file  . Number of children: Not on file  . Years of education: Not on file  . Highest education level: Not on file  Occupational History  . Occupation: retired  Tobacco Use  . Smoking status: Never Smoker  . Smokeless tobacco: Never Used  Vaping Use  . Vaping Use: Never used  Substance and Sexual Activity  . Alcohol use: Not Currently  . Drug use: Never  . Sexual activity: Not Currently  Other Topics Concern  . Not on file  Social History Narrative  . Not on file   Social Determinants of Health   Financial Resource Strain: Low Risk   . Difficulty of Paying Living Expenses: Not hard at all  Food Insecurity: No Food Insecurity  . Worried About Charity fundraiser in the Last Year: Never true  . Ran Out of Food in the Last Year: Never true  Transportation Needs: No Transportation Needs  . Lack of Transportation (Medical): No  . Lack of Transportation (Non-Medical): No  Physical Activity: Inactive  . Days of Exercise per Week: 0 days  . Minutes of Exercise per Session: 0 min  Stress: No Stress Concern Present  . Feeling of Stress : Not at all  Social Connections:   . Frequency of Communication with Friends and Family:   . Frequency of Social Gatherings with Friends and Family:   . Attends Religious Services:   . Active Member of Clubs or Organizations:   . Attends Archivist Meetings:   Marland Kitchen Marital Status:     Tobacco Counseling Counseling given: Not Answered   Clinical Intake:  Pre-visit preparation completed: Yes  Pain : 0-10 Pain Score: 1  Pain Type: Chronic pain Pain Location:  Generalized Pain Descriptors / Indicators: Aching Pain Onset: More than a month ago Pain Frequency: Constant Pain Relieving Factors: celebrex works  Pain Relieving Factors: celebrex works  Nutritional Status: BMI > 30  Obese Nutritional Risks: None Diabetes: Yes  How often do you need to have someone help you when you read instructions, pamphlets, or other written materials from your doctor or pharmacy?: 1 - Never What is the last grade level you completed in school?: college  Diabetic? Yes Nutrition Risk Assessment:  Has the patient had any N/V/D within the last 2 months?  No  Does the patient have any non-healing wounds?  No  Has the patient had any unintentional weight loss or weight gain?  No   Diabetes:  Is the patient diabetic?  Yes  If diabetic, was a CBG obtained today?  No  Did the patient bring in their glucometer from home?  No  How often do you monitor your CBG's? Twice daily.   Financial Strains and Diabetes Management:  Are you having any financial strains with the device, your supplies or your medication? No .  Does the patient want to be seen by Chronic Care Management for management of their diabetes?  No  Would the patient like to be referred to a Nutritionist or for Diabetic Management?  No   Diabetic Exams:  Diabetic Eye Exam: Overdue for diabetic eye exam. Pt has been advised about the importance in completing this exam. Patient advised to call and schedule an eye exam. Diabetic Foot Exam: Completed 06/25/2020   Interpreter Needed?: No  Information entered by :: NAllen LPN   Activities of Daily Living In your present state of health, do you have any difficulty performing the following activities: 07/09/2020 05/27/2020  Hearing? N N  Vision? N N  Difficulty concentrating or making decisions? Y Y  Comment trouble remembering -  Walking or climbing stairs? Y Y  Comment due to legs -  Dressing or bathing? N N  Doing errands, shopping? N N    Preparing Food and eating ? N -  Using the Toilet? N -  In the past six months, have you accidently leaked urine? N -  Do you have problems with loss of bowel control? N -  Managing your Medications? N -  Managing your Finances? N -  Housekeeping or managing your Housekeeping? N -    Patient Care Team: [name], DO as PCP - General (Family Medicine)  Indicate any recent Medical Services you may have received from other than [facility] providers in the past year (date may be approximate).     Assessment:   This is a routine wellness examination for Eric Andrews.  Hearing/Vision screen  Hearing Screening   125Hz  250Hz  500Hz  1000Hz  2000Hz  3000Hz  4000Hz  6000Hz  8000Hz   Right ear:           Left ear:           Vision Screening Comments: Regular eye exams. Dr. Ellin Mayhew  Dietary issues and exercise activities discussed: Current Exercise Habits: The patient does not participate in regular exercise at present  Goals    . Patient Stated     07/09/2020, wants to weigh 300 pounds      Depression Screen PHQ 2/9 Scores 07/09/2020 06/25/2020  PHQ - 2 Score 1 6  PHQ- 9 Score 13 19    Fall Risk Fall Risk  07/09/2020 06/25/2020  Falls in the past year? 1 0  Comment lost balance and trips -  Number falls in past yr: 1 0  Injury with Fall? 1 0  Risk for fall due to : History of fall(s);Impaired balance/gait;Impaired mobility;Medication side effect -  Follow up Falls evaluation completed;Education provided;Falls prevention discussed Falls evaluation completed    Any stairs in or around the home? Yes  If so, are there any without handrails? Yes  Home free of loose throw rugs in walkways, pet beds, electrical cords, etc? Yes  Adequate lighting in your home to reduce risk of falls? Yes   ASSISTIVE DEVICES UTILIZED TO PREVENT FALLS:  Life alert? No  Use of a cane, walker or w/c? No  Grab bars in the bathroom? No  Shower chair or bench in shower? No  Elevated toilet seat or a handicapped  toilet? No  TIMED UP AND GO:  Was the test performed? No .    Cognitive Function:     6CIT Screen 07/09/2020  What Year? 0 points  What month? 0 points  What time? 0 points  Count back from 20 0 points  Months in reverse 0 points  Repeat phrase 0 points  Total Score 0    Immunizations Immunization History  Administered Date(s) Administered  . Moderna SARS-COVID-2 Vaccination 02/09/2020, 03/13/2020  . Tdap 01/16/2018    TDAP status: Up to date Flu Vaccine status: Due Pneumococcal vaccine status: Due Covid-19 vaccine status: Completed vaccines  Qualifies for Shingles Vaccine? Yes   Zostavax completed No   Shingrix Completed?: No.    Education has been provided regarding the importance of this vaccine. Patient has been advised to call insurance company to determine out of pocket expense if they have not yet received this vaccine. Advised may also receive vaccine at local pharmacy or Health Dept. Verbalized acceptance and understanding.  Screening Tests Health Maintenance  Topic Date Due  . Hepatitis C Screening  Never done  . OPHTHALMOLOGY EXAM  Never done  . PNA vac Low Risk Adult (1 of 2 - PCV13) Never done  . INFLUENZA VACCINE  07/07/2020  . HEMOGLOBIN A1C  12/19/2020  . FOOT EXAM  06/25/2021  . COLONOSCOPY  02/10/2023  . TETANUS/TDAP  01/17/2028  . COVID-19 Vaccine  Completed    Health Maintenance  Health Maintenance Due  Topic Date Due  . Hepatitis C Screening  Never done  . OPHTHALMOLOGY EXAM  Never done  . PNA vac Low Risk Adult (1 of 2 - PCV13) Never done  . INFLUENZA VACCINE  07/07/2020    Colorectal cancer screening: Completed 02/09/2018. Repeat every 10 years  Lung Cancer Screening: (Low Dose CT Chest recommended if Age 32-80 years, 30 pack-year currently smoking OR have quit w/in 15years.) does not qualify.   Lung Cancer Screening Referral: no   Additional Screening:  Hepatitis C Screening: does qualify;   Vision Screening: Recommended  annual ophthalmology exams for early detection of glaucoma and other disorders of the eye. Is the patient up to date with their annual eye exam?  Yes  Who is the provider or what is the name of the office in which the patient attends annual eye exams? Dr. Ellin Mayhew If pt is not established with a provider, would they like to be referred to a provider to establish care? No .   Dental Screening: Recommended annual dental exams for proper oral hygiene  Community Resource Referral / Chronic Care Management: CRR required this visit?  No   CCM required this visit?  No      Plan:     I have personally reviewed and noted the following in the patient's chart:   . Medical and social history . Use of alcohol, tobacco or illicit drugs  . Current medications and supplements . Functional ability and status . Nutritional status . Physical activity . Advanced directives . List of other physicians . Hospitalizations, surgeries, and ER visits in previous 12 months . Vitals . Screenings to include cognitive, depression, and falls . Referrals and appointments  In addition, I have reviewed and discussed with patient certain preventive protocols, quality metrics, and best practice recommendations. A written personalized care plan for preventive services as well as general preventive health recommendations were provided to patient.     Kellie Simmering, LPN   12/12/1094   Nurse Notes: Patient is due for TDAP and Pneumonia  vaccines. Scheduled for eye exam this coming Friday.

## 2020-07-16 DIAGNOSIS — E119 Type 2 diabetes mellitus without complications: Secondary | ICD-10-CM | POA: Diagnosis not present

## 2020-07-16 DIAGNOSIS — H35372 Puckering of macula, left eye: Secondary | ICD-10-CM | POA: Diagnosis not present

## 2020-07-16 DIAGNOSIS — H2513 Age-related nuclear cataract, bilateral: Secondary | ICD-10-CM | POA: Diagnosis not present

## 2020-07-16 LAB — HM DIABETES EYE EXAM

## 2020-07-18 ENCOUNTER — Encounter: Payer: Self-pay | Admitting: Family Medicine

## 2020-07-31 ENCOUNTER — Other Ambulatory Visit: Payer: Self-pay | Admitting: Family Medicine

## 2020-07-31 DIAGNOSIS — Z794 Long term (current) use of insulin: Secondary | ICD-10-CM

## 2020-07-31 DIAGNOSIS — F331 Major depressive disorder, recurrent, moderate: Secondary | ICD-10-CM

## 2020-07-31 DIAGNOSIS — E785 Hyperlipidemia, unspecified: Secondary | ICD-10-CM

## 2020-07-31 DIAGNOSIS — E1169 Type 2 diabetes mellitus with other specified complication: Secondary | ICD-10-CM

## 2020-07-31 NOTE — Telephone Encounter (Signed)
Requested medications are due for refill today?   Unknown.  Trazodone listed as a historical medication.    Requested medications are on active medication list?  Yes - as a historical med.    Last Refill:   Unknown.    Future visit scheduled? No   Notes to Clinic:  Unable to refill historical medications.  Atorvastatin has already been filled.

## 2020-08-09 ENCOUNTER — Other Ambulatory Visit: Payer: Self-pay | Admitting: Family Medicine

## 2020-08-09 DIAGNOSIS — E1169 Type 2 diabetes mellitus with other specified complication: Secondary | ICD-10-CM

## 2020-08-09 DIAGNOSIS — Z794 Long term (current) use of insulin: Secondary | ICD-10-CM

## 2020-08-09 MED ORDER — OZEMPIC (0.25 OR 0.5 MG/DOSE) 2 MG/1.5ML ~~LOC~~ SOPN: 0.5000 mg | PEN_INJECTOR | SUBCUTANEOUS | 2 refills | Status: DC

## 2020-08-09 NOTE — Telephone Encounter (Signed)
Requested medication (s) are due for refill today: yes  Requested medication (s) are on the active medication list: yes   Last refill:  06/25/2020  Future visit scheduled: no  Notes to clinic:  Please verify dose and qty for refill    Requested Prescriptions  Pending Prescriptions Disp Refills   OZEMPIC, 0.25 OR 0.5 MG/DOSE, 2 MG/1.5ML SOPN      Sig: Inject 0.1875 mLs (0.25 mg total) into the skin once a week. For first 4 weeks. Then increase dose to 0.5mg  weekly      Endocrinology:  Diabetes - GLP-1 Receptor Agonists Passed - 08/09/2020 11:15 AM      Passed - HBA1C is between 0 and 7.9 and within 180 days    Hgb A1c MFr Bld  Date Value Ref Range Status  06/18/2020 7.9 (H) <5.7 % of total Hgb Final    Comment:    For someone without known diabetes, a hemoglobin A1c value of 6.5% or greater indicates that they may have  diabetes and this should be confirmed with a follow-up  test. . For someone with known diabetes, a value <7% indicates  that their diabetes is well controlled and a value  greater than or equal to 7% indicates suboptimal  control. A1c targets should be individualized based on  duration of diabetes, age, comorbid conditions, and  other considerations. . Currently, no consensus exists regarding use of hemoglobin A1c for diagnosis of diabetes for children. Renella Cunas - Valid encounter within last 6 months    Recent Outpatient Visits           1 month ago Type 2 diabetes mellitus with other specified complication, with long-term current use of insulin [facility])   [place], DO   2 months ago Type 2 diabetes mellitus with other specified complication, with long-term current use of insulin [facility])   [place], [name], DO

## 2020-08-09 NOTE — Telephone Encounter (Signed)
Medication Refill - Medication: ozempic   Has the patient contacted their pharmacy? Yes.   (Agent: If no, request that the patient contact the pharmacy for the refill.) (Agent: If yes, when and what did the pharmacy advise?)  Preferred Pharmacy (with phone number or street name):  [facility] [place], [address]  [place] [address]  Phone: [phone] Fax: [phone]  Hours: Not open 24 hours     Agent: Please be advised that RX refills may take up to 3 business days. We ask that you follow-up with your pharmacy.

## 2020-08-23 ENCOUNTER — Telehealth: Payer: Medicare Other | Admitting: Family Medicine

## 2020-09-02 ENCOUNTER — Encounter: Payer: Self-pay | Admitting: Family Medicine

## 2020-09-02 ENCOUNTER — Other Ambulatory Visit: Payer: Self-pay

## 2020-09-02 ENCOUNTER — Ambulatory Visit (INDEPENDENT_AMBULATORY_CARE_PROVIDER_SITE_OTHER): Payer: Medicare Other | Admitting: Family Medicine

## 2020-09-02 VITALS — BP 134/70 | HR 73 | Temp 97.6°F | Resp 16 | Ht 71.0 in | Wt 309.0 lb

## 2020-09-02 DIAGNOSIS — R0609 Other forms of dyspnea: Secondary | ICD-10-CM

## 2020-09-02 DIAGNOSIS — F411 Generalized anxiety disorder: Secondary | ICD-10-CM | POA: Diagnosis not present

## 2020-09-02 DIAGNOSIS — F331 Major depressive disorder, recurrent, moderate: Secondary | ICD-10-CM

## 2020-09-02 DIAGNOSIS — J984 Other disorders of lung: Secondary | ICD-10-CM | POA: Diagnosis not present

## 2020-09-02 DIAGNOSIS — R0600 Dyspnea, unspecified: Secondary | ICD-10-CM | POA: Diagnosis not present

## 2020-09-02 MED ORDER — BUSPIRONE HCL 5 MG PO TABS: 5.0000 mg | ORAL_TABLET | Freq: Two times a day (BID) | ORAL | 2 refills | Status: DC | PRN

## 2020-09-02 NOTE — Assessment & Plan Note (Signed)
Recent worsening anxiety See PHQ GAD scores MDD A&P Prior BDZ trial in past  Now will add Buspar to his SNRI - may take buspar 5mg  1-2 times a day PRN anxiety, future adjust accordingly May consider short term BDZ for panic attack or anxiety only, vs refer to Psych Anticipate will improve if breathing improved

## 2020-09-02 NOTE — Patient Instructions (Addendum)
Thank you for coming to the office today.  Referral sent to lung doctors / pulmonology  [facility] [address], [place], [place] Phone: [phone]  Stay tuned for apt, will likely need breathing test and imaging, may have Restrictive Lung Disease.  -----  For anxiety  Try new med Buspar 5mg  take 1 or 2 pills a day for anxiety AS NEEDED, can skip day if need, or can try regularly taking it.   Please schedule a Follow-up Appointment to: Return in about 5 weeks (around 10/07/2020) for DM A1c.  If you have any other questions or concerns, please feel free to call the office or send a message through [place]. You may also schedule an earlier appointment if necessary.  Additionally, you may be receiving a survey about your experience at our office within a few days to 1 week by e-mail or mail. We value your feedback.  Nobie Putnam, DO [place]

## 2020-09-02 NOTE — Progress Notes (Signed)
Subjective:    Patient ID: Eric Andrews, male    DOB: [birth year], 69 y.o.   MRN: [id number]  Eric Andrews is a 69 y.o. male presenting on 09/02/2020 for Shortness of Breath (breathlessness on exertion.and when gets stress ongoing from years but now getting worst denies Covid like Sxs did not prioritize with all other his past Hx but wants to be ckecked out now since it is getting worst)   HPI   Dyspnea on Exertion Restrictive Lung Disease, history Reports chronic problem for several years with shortness of breath with exertion often, he has had prior cardiac work up back in [place], with nuclear stress test reported negative and cardiac catheterization as well, reported that had "questionable" results but then cath showed no blockages.  - He has always had some difficulty with maintaining his breathing with sports or other activity and diving as kid. Previous employer, checked X-ray and he reportedly has PFTs from employer that was told showed "restrictive lung disease" - Today reports persistent issue with dyspnea on exertion still. Says worse with stressors. He has not had productive cough, or other sick symptoms. - Since he has retired he has been more active with physical work and yard work and exertion and he has been noticing these symptoms more often.  PMH - Type 2 Diabetes, Morbid Obesity  Major Depression, recurrent moderate Generalized Anxiety Disorder - Reports the ozempic reduces his food craving, but his anxiety often can make him stress eat. - He has worse time dealing with anxiety lately with family stressors with illness - His prior PCP inc dose Venlafaxine from 150 to 225 in past was too high caused side effect. - He was on Ativan or BDZ in past temporarily but did not take it long term, he said it was effective at that time, has never taken [place] Maintenance: UTD COVID vaccine Return for Flu Vaccine by request  Depression screen [facility] 2/9 09/02/2020 07/09/2020  06/25/2020  Decreased Interest 3 0 3  Down, Depressed, Hopeless 3 1 3   PHQ - 2 Score 6 1 6   Altered sleeping 3 0 1  Tired, decreased energy 3 3 3   Change in appetite 3 3 3   Feeling bad or failure about yourself  3 3 3   Trouble concentrating 2 3 2   Moving slowly or fidgety/restless 2 0 1  Suicidal thoughts 1 0 0  PHQ-9 Score 23 13 19   Difficult doing work/chores Somewhat difficult Somewhat difficult Very difficult   GAD 7 : Generalized Anxiety Score 09/02/2020 06/25/2020  Nervous, Anxious, on Edge 3 2  Control/stop worrying 3 3  Worry too much - different things 3 3  Trouble relaxing 3 3  Restless 1 1  Easily annoyed or irritable 3 1  Afraid - awful might happen 2 2  Total GAD 7 Score 18 15  Anxiety Difficulty Very difficult Somewhat difficult     Social History   Tobacco Use  . Smoking status: Never Smoker  . Smokeless tobacco: Never Used  Vaping Use  . Vaping Use: Never used  Substance Use Topics  . Alcohol use: Not Currently  . Drug use: Never    Review of Systems Per HPI unless specifically indicated above     Objective:    BP 134/70   Pulse 73   Temp 97.6 F (36.4 C) (Temporal)   Resp 16   Ht 5\' 11"  (1.803 m)   Wt (!) 309 lb (140.2 kg)   SpO2 98%   BMI  43.10 kg/m   Wt Readings from Last 3 Encounters:  09/02/20 (!) 309 lb (140.2 kg)  07/09/20 (!) 312 lb (141.5 kg)  06/25/20 (!) 315 lb 12.8 oz (143.2 kg)    Physical Exam Vitals and nursing note reviewed.  Constitutional:      General: He is not in acute distress.    Appearance: He is well-developed. He is obese. He is not diaphoretic.     Comments: Well-appearing, comfortable, cooperative  HENT:     Head: Normocephalic and atraumatic.  Eyes:     General:        Right eye: No discharge.        Left eye: No discharge.     Conjunctiva/sclera: Conjunctivae normal.  Neck:     Thyroid: No thyromegaly.  Cardiovascular:     Rate and Rhythm: Normal rate and regular rhythm.     Heart sounds: Normal  heart sounds. No murmur heard.   Pulmonary:     Effort: Pulmonary effort is normal. No respiratory distress.     Breath sounds: Normal breath sounds. No stridor. No decreased breath sounds, wheezing, rhonchi or rales.  Musculoskeletal:        General: Normal range of motion.     Cervical back: Normal range of motion and neck supple.  Lymphadenopathy:     Cervical: No cervical adenopathy.  Skin:    General: Skin is warm and dry.     Findings: No erythema or rash.  Neurological:     Mental Status: He is alert and oriented to person, place, and time.  Psychiatric:        Behavior: Behavior normal.     Comments: Well groomed, good eye contact, normal speech and thoughts    Results for orders placed or performed in visit on 07/18/20  HM DIABETES EYE EXAM  Result Value Ref Range   HM Diabetic Eye Exam No Retinopathy No Retinopathy      Assessment & Plan:   Problem List Items Addressed This Visit    Major depressive disorder, recurrent, moderate ([place])    Chronic problems. Episodic worsening, chronic recurrent moderate Elevated PHQ/GAD scores today Continue current plan with Venlafaxine, Trazodone   See GAD A&P, with breakthrough with anxiety      Relevant Medications   busPIRone (BUSPAR) 5 MG tablet   GAD (generalized anxiety disorder)    Recent worsening anxiety See PHQ GAD scores MDD A&P Prior BDZ trial in past  Now will add Buspar to his SNRI - may take buspar 5mg  1-2 times a day PRN anxiety, future adjust accordingly May consider short term BDZ for panic attack or anxiety only, vs refer to Psych Anticipate will improve if breathing improved      Relevant Medications   busPIRone (BUSPAR) 5 MG tablet    Other Visit Diagnoses    Dyspnea on exertion    -  Primary   Relevant Orders   Ambulatory referral to Pulmonology   Restrictive lung disease       Relevant Orders   Ambulatory referral to Pulmonology     #Dyspnea on Exertion / Restrictive Lung  Disease  Chronic problem for years with DOE, prior PFTs by employer with restrictive lung disease by his report. No other focal abnormalities or new changes, just progressive symptoms.  Less likely cardiac etiology based on recent cardiac work up in [place] with nuclear stress / cardiac catheterization.  He declines offered albuterol/inhaler therapy now at this time as trial  Referral to [facility]  for further management/work up  Orders Placed This Encounter  Procedures  . Ambulatory referral to Pulmonology    Referral Priority:   Routine    Referral Type:   Consultation    Referral Reason:   Specialty Services Required    Requested Specialty:   Pulmonary Disease    Number of Visits Requested:   1     Meds ordered this encounter  Medications  . busPIRone (BUSPAR) 5 MG tablet    Sig: Take 1 tablet (5 mg total) by mouth 2 (two) times daily as needed (anxiety).    Dispense:  60 tablet    Refill:  2      Follow up plan: Return in about 5 weeks (around 10/07/2020) for DM A1c.   Nobie Putnam, [facility] 09/02/2020, 11:43 AM

## 2020-09-02 NOTE — Assessment & Plan Note (Signed)
Chronic problems. Episodic worsening, chronic recurrent moderate Elevated PHQ/GAD scores today Continue current plan with Venlafaxine, Trazodone   See GAD A&P, with breakthrough with anxiety

## 2020-09-12 ENCOUNTER — Other Ambulatory Visit: Payer: Self-pay

## 2020-09-12 ENCOUNTER — Telehealth: Payer: Self-pay

## 2020-09-12 ENCOUNTER — Ambulatory Visit
Admission: EM | Admit: 2020-09-12 | Discharge: 2020-09-12 | Disposition: A | Discharge status: Home / Self Care | Payer: Medicare Other | Attending: Physician Assistant | Admitting: Physician Assistant

## 2020-09-12 DIAGNOSIS — K229 Disease of esophagus, unspecified: Secondary | ICD-10-CM | POA: Diagnosis not present

## 2020-09-12 DIAGNOSIS — H15003 Unspecified scleritis, bilateral: Secondary | ICD-10-CM | POA: Diagnosis not present

## 2020-09-12 DIAGNOSIS — S46912A Strain of unspecified muscle, fascia and tendon at shoulder and upper arm level, left arm, initial encounter: Secondary | ICD-10-CM

## 2020-09-12 DIAGNOSIS — R079 Chest pain, unspecified: Secondary | ICD-10-CM | POA: Diagnosis not present

## 2020-09-12 MED ORDER — ALUM & MAG HYDROXIDE-SIMETH 200-200-20 MG/5ML PO SUSP
30.0000 mL | Freq: Once | ORAL | Status: AC
  Administered 2020-09-12: 30 mL via ORAL

## 2020-09-12 MED ORDER — LIDOCAINE VISCOUS HCL 2 % MT SOLN
15.0000 mL | Freq: Once | OROMUCOSAL | Status: AC
  Administered 2020-09-12: 15 mL via ORAL

## 2020-09-12 MED ORDER — LANSOPRAZOLE 30 MG PO CPDR: DELAYED_RELEASE_CAPSULE | ORAL | 0 refills | Status: DC

## 2020-09-12 MED ORDER — DICYCLOMINE HCL 10 MG/5ML PO SOLN: 10.0000 mg | Freq: Once | ORAL | Status: DC

## 2020-09-12 NOTE — ED Provider Notes (Addendum)
[facility]    CSN: [id number] Arrival date & time: 09/12/20  1530      History   Chief Complaint Chief Complaint  Patient presents with  . Chest Pain    HPI Eric Andrews is a 69 y.o. male presents with a couple of issues: 1- has been habing mid sternal chest pain x 5-7. The pain is described as pressure along the length of the sternum. He admits he has been under a lot of stress due to dealing with his daughter who has cancer and is an alcoholic. Has been waking up with this pain off and on. After yelling at her felt better, after nap after lunch felt better. Moving his arms or thorax does not provoke it, but if he is watching the news gets bad. Denies SOB or sweating when having the CP.  The chest pressure has been provoked when doing yard work, and then starts getting horsed.  He had la sagna today for lunch and denies GERD.  The pressure is intermittent lasting from a few minutes to a couple of hours, and pain level goes  from 2/10 to 3/10 3h later, then back down to 2/10. Had a normal stress test 10 years ago. Has hx of enlarged heart, but has not seen a cardiologist in 10 years.  He takes buspiradone for anxiety and had taken it when he yelled at his daughter today.  2- has been having redness and mild pressure in his eyes since this am, but denies blurry vision. Denies sneezing, itchy nose. He uses a c-pap and denies it leaking air to his eyes.   3- has been having L shoulder and upper arm pain which started from mid scapula and moved to his shoulder x 3 days. Denies injuring himself. The last time he did yard work was 3 days ago. Pain is provoked with palpation and movement of his L arm.    Past Medical History:  Diagnosis Date  . ADD (attention deficit disorder)   . Anxiety   . Depression   . Enlarged prostate   . Knee pain   . Sleep apnea     Patient Active Problem List   Diagnosis Date Noted  . Morbid obesity with BMI of 40.0-44.9, adult ([place]) 06/26/2020   . Major depressive disorder, recurrent, moderate ([place]) 06/25/2020  . GAD (generalized anxiety disorder) 06/25/2020  . Type 2 diabetes mellitus with other specified complication ([place]) 63/84/5364  . Chronic pain of right knee 05/27/2020  . Primary osteoarthritis involving multiple joints 05/27/2020  . Cataract associated with type 2 diabetes mellitus ([place]) 05/27/2020  . History of torn meniscus of left knee 05/27/2020  . DDD (degenerative disc disease), lumbar 05/27/2020    Past Surgical History:  Procedure Laterality Date  . APPENDECTOMY    . HERNIA REPAIR     Left  . KNEE ARTHROSCOPY     Left     Home Medications    Prior to Admission medications   Medication Sig Start Date End Date Taking? Authorizing Provider  aspirin 81 MG EC tablet Take by mouth.     [provider]  atorvastatin (LIPITOR) 20 MG tablet TAKE 1 TABLET BY MOUTH AT BEDTIME 08/01/20   Karamalegos, Alexander J, DO  busPIRone (BUSPAR) 5 MG tablet Take 1 tablet (5 mg total) by mouth 2 (two) times daily as needed (anxiety). 09/02/20   Karamalegos, [name], DO  celecoxib (CELEBREX) 200 MG capsule Take 200 mg by mouth 2 (two) times daily.  [provider]  cetirizine (ZYRTEC) 10 MG tablet Take by mouth.    [provider]  chlorthalidone (HYGROTON) 25 MG tablet Take 25 mg by mouth daily.    [provider]  cholecalciferol (VITAMIN D3) 25 MCG (1000 UNIT) tablet Take 1,000 Units by mouth every other day.    [provider]  cyanocobalamin 1000 MCG tablet Take by mouth.     [provider]  empagliflozin (JARDIANCE) 25 MG TABS tablet Take 25 mg by mouth daily.    [provider]  fenofibrate micronized (LOFIBRA) 134 MG capsule Take by mouth.    [provider]  finasteride (PROSCAR) 5 MG tablet Take 1 tablet (5 mg total) by mouth every evening. 07/01/20   [name], [name], DO  hydrALAZINE (APRESOLINE) 100 MG tablet Take by mouth.     [provider]  Insulin Glargine (TOUJEO SOLOSTAR [place]) Inject 50 Units into the skin every evening.    [provider]  insulin lispro (HUMALOG) 100 UNIT/ML injection Inject 13 Units into the skin 2 (two) times daily with a meal.    [provider]  losartan (COZAAR) 50 MG tablet Take 50 mg by mouth daily.    [provider]  metFORMIN (GLUCOPHAGE-XR) 500 MG 24 hr tablet Take 1,000 mg by mouth 2 (two) times daily with a meal. 05/27/20   Karamalegos, [name], DO  Omega-3 Fatty Acids (FISH OIL) 1000 MG CAPS Take by mouth in the morning and at bedtime.    [provider]  OZEMPIC, 0.25 OR 0.5 MG/DOSE, 2 MG/1.5ML SOPN Inject 0.375 mLs (0.5 mg total) into the skin once a week. 08/09/20   Karamalegos, [name], DO  pioglitazone (ACTOS) 15 MG tablet Take 15 mg by mouth daily.    [provider]  propranolol (INDERAL) 20 MG tablet Take 20 mg by mouth daily.    [provider]  SUMAtriptan (IMITREX) 100 MG tablet Take by mouth.     [provider]  tamsulosin (FLOMAX) 0.4 MG CAPS capsule Take 0.4 mg by mouth at bedtime.    [provider]  traZODone (DESYREL) 100 MG tablet TAKE 1 TABLET BY MOUTH AT BEDTIME 08/01/20   Karamalegos, Alexander J, DO  venlafaxine XR (EFFEXOR-XR) 150 MG 24 hr capsule Take 150 mg by mouth daily with breakfast.    [provider]    Family History Family History  Problem Relation Age of Onset  . Heart attack Mother 42  . Lung cancer Father   . Alzheimer's disease Brother   . Heart attack Maternal Grandfather 80    Social History Social History   Tobacco Use  . Smoking status: Never Smoker  . Smokeless tobacco: Never Used  Vaping Use  . Vaping Use: Never used  Substance Use Topics  . Alcohol use: Not Currently  . Drug use: Never     Allergies   Dust mite extract   Review of Systems Review of Systems  Constitutional: Negative for activity change, appetite change,  diaphoresis, fatigue and fever.  HENT: Negative for congestion, postnasal drip and rhinorrhea.   Eyes: Positive for redness. Negative for photophobia, pain, discharge, itching and visual disturbance.  Respiratory: Positive for chest tightness and shortness of breath. Negative for cough and wheezing.   Cardiovascular: Positive for chest pain. Negative for palpitations and leg swelling.  Gastrointestinal: Negative for abdominal pain and nausea.  Musculoskeletal: Positive for arthralgias and myalgias. Negative for joint swelling.  Skin: Negative for rash.  Neurological:  Negative for numbness.   Physical Exam Triage Vital Signs ED Triage Vitals  Enc Vitals Group     BP 09/12/20 1539 130/78     Pulse Rate 09/12/20 1539 82     Resp 09/12/20 1539 18     Temp 09/12/20 1539 97.9 F (36.6 C)     Temp Source 09/12/20 1539 Oral     SpO2 09/12/20 1539 100 %     Weight 09/12/20 1541 (!) 309 lb 1.4 oz (140.2 kg)     Height 09/12/20 1541 5\' 11"  (1.803 m)     Head Circumference --      Peak Flow --      Pain Score 09/12/20 1540 2     Pain Loc --      Pain Edu? --      Excl. in [place]? --    No data found.  Updated Vital Signs BP 130/78   Pulse 82   Temp 97.9 F (36.6 C) (Oral)   Resp 18   Ht 5\' 11"  (1.803 m)   Wt (!) 309 lb 1.4 oz (140.2 kg)   SpO2 100%   BMI 43.11 kg/m   Visual Acuity Right Eye Distance:   Left Eye Distance:   Bilateral Distance:    Right Eye Near:   Left Eye Near:    Bilateral Near:     Physical Exam Vitals and nursing note reviewed.  Constitutional:      General: He is not in acute distress.    Appearance: He is obese. He is not toxic-appearing.  Eyes:     Extraocular Movements: Extraocular movements intact.     Pupils: Pupils are equal, round, and reactive to light.     Comments: Medial and lateral scleral region are mildly injected  Cardiovascular:     Rate and Rhythm: Normal rate and regular rhythm.     Heart sounds: Normal heart sounds. No murmur  heard.  No diastolic murmur is present.   Pulmonary:     Effort: Pulmonary effort is normal.     Breath sounds: Normal breath sounds.     Comments: Has mild chest wall soreness with palpation, but does not  Provoke the chest pressure he has been feeling.  Chest:     Chest wall: No mass or deformity.  Abdominal:     General: Bowel sounds are normal.     Palpations: Abdomen is soft. There is no hepatomegaly or splenomegaly.     Tenderness: There is no abdominal tenderness.  Musculoskeletal:        General: Normal range of motion.     Left shoulder: Tenderness present. No swelling, deformity, effusion, laceration, bony tenderness or crepitus. Normal range of motion. Normal strength. Normal pulse.     Cervical back: Normal and neck supple.     Thoracic back: Normal.       Back:     Right lower leg: No edema.     Left lower leg: No edema.     Comments: Has local tenderness on the upper bicep muscle and upper tricep with palpation.   Has tenderness on L rhomboid area.   Skin:    General: Skin is warm and dry.     Findings: No rash.  Neurological:     Mental Status: He is alert and oriented to person, place, and time.  Psychiatric:        Mood and Affect: Mood normal.        Behavior: Behavior normal.  UC Treatments / Results  Labs (all labs ordered are listed, but only abnormal results are displayed) Labs Reviewed - No data to display  EKG  Normal sinus rhythm with left axis deviation. No record of other EKG found for comparison. This was obtained twice and the second time MA made sure the leads were not inverted.   Radiology No results found.  Procedures Procedures (including critical care time)  Medications Ordered in UC Medications - No data to display  Initial Impression / Assessment and Plan / UC Course  I have reviewed the triage vital signs and the nursing notes. He was given GI cocktail and his chest pressure completely resolved. I am suspecting he may  have been having esophageal spasms and possibly from silent GERD. I placed him on Prevacid 30 mg q am, and to follow GERD diet and avoid eating before bed time.  Has voltaren gel at home, so advised to apply this on sore muscles.  Also advised to use saline eye drops 3-4 times a day for 7 days for scleritis. Encouraged strongly to follow up with cardiology for enlarge heart.  Final Clinical Impressions(s) / UC Diagnoses   Final diagnoses:  None   Discharge Instructions   None    ED Prescriptions    None     PDMP not reviewed this encounter.   Rodriguez-Southworth, Sunday Spillers, Hershal Coria 09/12/20 2024    Rodriguez-Southworth, Sunday Spillers, PA-C 09/12/20 2045

## 2020-09-12 NOTE — Telephone Encounter (Signed)
Copied from [place] [id number]. Topic: General - Other >> Sep 10, 2020 10:21 AM Leward Quan A wrote: Reason for CRM: Patient called to inquire if Dr Raliegh Ip would like to send him to get his heart checked out. States that sometimes the lungs does not bother him but at time he feel like he has high BP or something. Please advise Ph# [phone]

## 2020-09-12 NOTE — Telephone Encounter (Signed)
As per patient has pulmonary appt on 10/08/2020 and has some stress from past couple of days feels like some chest pain and some shoulder pain left side and dizziness have not checked his blood pressure wanted to be seen by Dr Raliegh Ip advised him to get seek care at urgent care and be evaluated, patient's reply that is due to stress he is having and want to wait till next week and okay seen by [facility] for blood pressure check.

## 2020-09-12 NOTE — Telephone Encounter (Signed)
Patient will go to urgent care after mentioning that it is provider's recommendation for intermittent chest pain for better evaluation and treatment --cancelled Tuesday's appointment with Elmyra Ricks.

## 2020-09-12 NOTE — Telephone Encounter (Signed)
Should be seen through Urgent Care or the ER if he is actively having chest pain.  While it may only be stress, it could be something larger.

## 2020-09-12 NOTE — ED Triage Notes (Signed)
Pt reports having mid sternal chest pain x5-7 days. Also reports having L shoulder and L elbow pain.   Also reports having redness and pressure to eyes. Denies blurry vision.  No other symptoms or concerns at this time.

## 2020-09-12 NOTE — Discharge Instructions (Addendum)
Apply Voltaren on muscle soreness on your L arm for 7 days twice a day Take the Prevacid every morning to see if this helps possible silent GERD that could be causing esophagus spasms Follow up with your family Dr in 2 weeks.  You need to get established with a cardiologist to see the  status of your heart enlargement, you need an echocardiogram.  Apply saline drops into your eyes, 2 drops into each eye 3-4 times a day for 5 days

## 2020-09-16 ENCOUNTER — Other Ambulatory Visit: Payer: Self-pay | Admitting: Family Medicine

## 2020-09-16 ENCOUNTER — Telehealth: Payer: Self-pay | Admitting: Family Medicine

## 2020-09-16 DIAGNOSIS — I517 Cardiomegaly: Secondary | ICD-10-CM

## 2020-09-16 DIAGNOSIS — R0600 Dyspnea, unspecified: Secondary | ICD-10-CM

## 2020-09-16 DIAGNOSIS — R0609 Other forms of dyspnea: Secondary | ICD-10-CM

## 2020-09-16 NOTE — Telephone Encounter (Signed)
Patient needs a referral request after going to the ED, where the doctor told him that his heart was enlarged on the left side

## 2020-09-16 NOTE — Telephone Encounter (Signed)
The pt called requesting an referral to a cardiologist to f/u on history of enlarge heart x 10 yrs. He was seen at [facility] x 4 days ago for chest pain. Diagnose with Esophagus disorder, but strongly encouraged to establish with an Cardiologist. Please advise

## 2020-09-16 NOTE — Telephone Encounter (Signed)
Referral to cardiology placed.

## 2020-09-17 ENCOUNTER — Ambulatory Visit: Payer: Medicare Other | Admitting: Family Medicine

## 2020-09-23 ENCOUNTER — Ambulatory Visit: Payer: Medicare Other | Admitting: Cardiology

## 2020-09-23 ENCOUNTER — Encounter: Payer: Self-pay | Admitting: Cardiology

## 2020-09-23 ENCOUNTER — Other Ambulatory Visit: Payer: Self-pay

## 2020-09-23 VITALS — BP 122/74 | HR 71 | Ht 71.0 in | Wt 306.0 lb

## 2020-09-23 DIAGNOSIS — E7800 Pure hypercholesterolemia, unspecified: Secondary | ICD-10-CM

## 2020-09-23 DIAGNOSIS — R9431 Abnormal electrocardiogram [ECG] [EKG]: Secondary | ICD-10-CM

## 2020-09-23 DIAGNOSIS — I10 Essential (primary) hypertension: Secondary | ICD-10-CM | POA: Diagnosis not present

## 2020-09-23 DIAGNOSIS — R079 Chest pain, unspecified: Secondary | ICD-10-CM | POA: Diagnosis not present

## 2020-09-23 DIAGNOSIS — Z6841 Body Mass Index (BMI) 40.0 and over, adult: Secondary | ICD-10-CM

## 2020-09-23 NOTE — Patient Instructions (Signed)

## 2020-09-23 NOTE — Progress Notes (Signed)
Cardiology Office Note:    Date:  09/23/2020   ID:  Eric Andrews, DOB 1951/11/12, MRN [id number]  PCP:  [name], DO  [place] Cardiologist:  No primary care provider on file.  [facility] Electrophysiologist:  None   Referring MD: Nobie Putnam *   Chief Complaint  Patient presents with  . New Patient (Initial Visit)    Pt c/o CP and paindown left arm; hoarseness of voice. Staes left side of his heart is enlarged, needs to establish care for that. Still having chest pain and pain also radiating to left shoulder and arm, constant---worse when stressed.     History of Present Illness:    Eric Andrews is a 69 y.o. male with a hx of hypertension, hyperlipidemia, diabetes, obesity anxiety, who presents due to chest pain and abnormal ECG.  He had episodes of chest pain 2 weeks ago, presented at [facility] urgent care center where EKG was obtained.  EKG showed left axis deviation.  Patient symptoms were attributed to reflux symptoms.  PPIs were initiated which has helped patient's symptoms.  Currently denies any symptoms of chest pain or shortness of breath.  Takes all his medications as prescribed.  Blood pressure is well controlled.  Past Medical History:  Diagnosis Date  . ADD (attention deficit disorder)   . Anxiety   . Depression   . Enlarged prostate   . Knee pain   . Sleep apnea     Past Surgical History:  Procedure Laterality Date  . APPENDECTOMY    . HERNIA REPAIR     Left  . KNEE ARTHROSCOPY     Left    Current Medications: No outpatient medications have been marked as taking for the 09/23/20 encounter (Office Visit) with Kate Sable, MD.     Allergies:   Dust mite extract   Social History   Socioeconomic History  . Marital status: Married    Spouse name: Not on file  . Number of children: Not on file  . Years of education: Not on file  . Highest education level: Not on file  Occupational History  . Occupation: retired    Tobacco Use  . Smoking status: Never Smoker  . Smokeless tobacco: Never Used  Vaping Use  . Vaping Use: Never used  Substance and Sexual Activity  . Alcohol use: Not Currently  . Drug use: Never  . Sexual activity: Not Currently  Other Topics Concern  . Not on file  Social History Narrative  . Not on file   Social Determinants of Health   Financial Resource Strain: Low Risk   . Difficulty of Paying Living Expenses: Not hard at all  Food Insecurity: No Food Insecurity  . Worried About Charity fundraiser in the Last Year: Never true  . Ran Out of Food in the Last Year: Never true  Transportation Needs: No Transportation Needs  . Lack of Transportation (Medical): No  . Lack of Transportation (Non-Medical): No  Physical Activity: Inactive  . Days of Exercise per Week: 0 days  . Minutes of Exercise per Session: 0 min  Stress: No Stress Concern Present  . Feeling of Stress : Not at all  Social Connections:   . Frequency of Communication with Friends and Family: Not on file  . Frequency of Social Gatherings with Friends and Family: Not on file  . Attends Religious Services: Not on file  . Active Member of Clubs or Organizations: Not on file  . Attends Archivist  Meetings: Not on file  . Marital Status: Not on file     Family History: The patient's family history includes Alzheimer's disease in his brother; Heart attack (age of onset: 20) in his maternal grandfather; Heart attack (age of onset: 57) in his mother; Lung cancer in his father.  ROS:   Please see the history of present illness.     All other systems reviewed and are negative.  EKGs/Labs/Other Studies Reviewed:    The following studies were reviewed today:   EKG:  EKG is  ordered today.  The ekg ordered today demonstrates normal sinus rhythm, left axis deviation   Recent Labs: 12/19/2019: TSH 2.21 06/18/2020: ALT 61; BUN 21; Creat 1.08; Hemoglobin 16.0; Platelets 137; Potassium 4.2; Sodium 138   Recent Lipid Panel    Component Value Date/Time   CHOL 114 06/18/2020 0857   TRIG 181 (H) 06/18/2020 0857   HDL 34 (L) 06/18/2020 0857   CHOLHDL 3.4 06/18/2020 0857   LDLCALC 54 06/18/2020 0857     Risk Assessment/Calculations:      Physical Exam:    VS:  BP 122/74   Pulse 71   Ht 5\' 11"  (1.803 m)   Wt (!) 306 lb (138.8 kg)   BMI 42.68 kg/m     Wt Readings from Last 3 Encounters:  09/23/20 (!) 306 lb (138.8 kg)  09/12/20 (!) 309 lb 1.4 oz (140.2 kg)  09/02/20 (!) 309 lb (140.2 kg)     GEN:  Well nourished, well developed in no acute distress HEENT: Normal NECK: No JVD; No carotid bruits LYMPHATICS: No lymphadenopathy CARDIAC: RRR, no murmurs, rubs, gallops RESPIRATORY:  Clear to auscultation without rales, wheezing or rhonchi  ABDOMEN: Soft, non-tender, non-distended MUSCULOSKELETAL:  No edema; No deformity  SKIN: Warm and dry NEUROLOGIC:  Alert and oriented x 3 PSYCHIATRIC:  Normal affect   ASSESSMENT:    1. Chest pain of uncertain etiology   2. Nonspecific abnormal electrocardiogram (ECG) (EKG)   3. Primary hypertension   4. Pure hypercholesterolemia   5. Morbid obesity with BMI of 40.0-44.9, adult ([place])    PLAN:    In order of problems listed above:  1. Patient with chest pain secondary to GI etiology.  Continue PPIs as per prescribed symptoms are now resolved.   2. EKG showed normal sinus with left axis deviation, likely secondary to obesity versus hypertension.  Continue management of risk factors recommended. 3. 3 of hypertension, BP controlled.  Continue current BP meds. 4. History of hyperlipidemia, continue statin as prescribed. 5. Patient is morbidly obese, weight loss, low-calorie diet advised.  Follow-up as needed  Total encounter time 60 minutes  Greater than 50% was spent in counseling and coordination of care with the patient   Medication Adjustments/Labs and Tests Ordered: Current medicines are reviewed at length with the patient  today.  Concerns regarding medicines are outlined above.  Orders Placed This Encounter  Procedures  . EKG 12-Lead   No orders of the defined types were placed in this encounter.   Patient Instructions  Medication Instructions:  Your physician recommends that you continue on your current medications as directed. Please refer to the Current Medication list given to you today.  *If you need a refill on your cardiac medications before your next appointment, please call your pharmacy*  Follow-Up: At [facility], you and your health needs are our priority.  As part of our continuing mission to provide you with exceptional heart care, we have created designated Provider Care  Teams.  These Care Teams include your primary Cardiologist (physician) and Advanced Practice Providers (APPs -  Physician Assistants and Nurse Practitioners) who all work together to provide you with the care you need, when you need it.  We recommend signing up for the patient portal called "MyChart".  Sign up information is provided on this After Visit Summary.  MyChart is used to connect with patients for Virtual Visits (Telemedicine).  Patients are able to view lab/test results, encounter notes, upcoming appointments, etc.  Non-urgent messages can be sent to your provider as well.   To learn more about what you can do with MyChart, go to [email or web address].    Your next appointment:   As needed.     Signed, Kate Sable, MD  09/23/2020 1:03 PM    [place]

## 2020-10-08 ENCOUNTER — Encounter: Payer: Self-pay | Admitting: Pulmonary Disease

## 2020-10-08 ENCOUNTER — Other Ambulatory Visit: Payer: Self-pay

## 2020-10-08 ENCOUNTER — Ambulatory Visit: Payer: Medicare Other | Admitting: Pulmonary Disease

## 2020-10-08 ENCOUNTER — Telehealth: Payer: Self-pay

## 2020-10-08 VITALS — BP 138/80 | HR 93 | Temp 97.1°F | Ht 71.0 in | Wt 308.2 lb

## 2020-10-08 DIAGNOSIS — Z9989 Dependence on other enabling machines and devices: Secondary | ICD-10-CM | POA: Diagnosis not present

## 2020-10-08 DIAGNOSIS — R0602 Shortness of breath: Secondary | ICD-10-CM

## 2020-10-08 DIAGNOSIS — Z6841 Body Mass Index (BMI) 40.0 and over, adult: Secondary | ICD-10-CM | POA: Diagnosis not present

## 2020-10-08 DIAGNOSIS — G4733 Obstructive sleep apnea (adult) (pediatric): Secondary | ICD-10-CM | POA: Diagnosis not present

## 2020-10-08 NOTE — Patient Instructions (Signed)
We are going to schedule breathing test.  We will schedule an echocardiogram.  We are going to procure a copy of your sleep study performed in [place].  We will see you in follow-up in 4 to 6 weeks time.

## 2020-10-08 NOTE — Telephone Encounter (Signed)
Medical records request has been faxed to [facility] requesting sleep study results.  Will leave encounter open to ensure f/u.

## 2020-10-08 NOTE — Progress Notes (Signed)
Subjective:    Patient ID: Eric Andrews, male    DOB: 1950/12/28, 69 y.o.   MRN: [id number]  HPI Eric Andrews is a 69 year old lifelong never smoker native of [place], who presents for evaluation of dyspnea.  He is kindly referred by Dr. Nobie Putnam.  Eric Andrews.  Previously had been in [place] where he had had "breathing test" that had told him he had "restricted lungs".  He notes that when he breathes fast it hurts his trachea and he feels like he is short of breath and hoarse and barely able to talk.  He has issues with morbid obesity (BMI 0.9) and obstructive sleep apnea.  He has been on CPAP for several years at 11 cm H2O prescribed previously in [place].  He notes that his symptoms of shortness of breath started approximately 10 years ago.  He notes that these are getting worse.  He notes that "fast breathing" worsens his symptoms while rest and doing nothing helps him.  He has not used any inhalers to treat this.  He notes that he has been short of breath even in his youth.  He has not had any fevers, chills or sweats.  No chest pain.  No cough or sputum production.  No orthopnea or paroxysmal nocturnal dyspnea occasional lower extremity edema.  He does have frequent heartburn and reflux symptoms.  He does not recall a history of asthma as a child.  The patient does not have a military history.  He has previously resided in [place], [place] and [place].  He is a native of [place], [place].  The patient has worked in [place] for approximately 45 years and has had spirometry done frequently because of this exposure.  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  It is during spirometry performed to monitor this that he has been told he has "restricted lungs".  Patient notes today that he needs new CPAP equipment as his his over 78 years old.   Review of Systems A 10 point review of systems was performed and  it is as noted above otherwise negative.  Past Medical History:  Diagnosis Date  . ADD (attention deficit disorder)   . Anxiety   . Depression   . Enlarged prostate   . Knee pain   . Sleep apnea    Patient Active Problem List   Diagnosis Date Noted  . Morbid obesity with BMI of 40.0-44.9, adult ([place]) 06/26/2020  . Major depressive disorder, recurrent, moderate ([place]) 06/25/2020  . GAD (generalized anxiety disorder) 06/25/2020  . Type 2 diabetes mellitus with other specified complication ([place]) 28/36/6294  . Chronic pain of right knee 05/27/2020  . Primary osteoarthritis involving multiple joints 05/27/2020  . Cataract associated with type 2 diabetes mellitus ([place]) 05/27/2020  . History of torn meniscus of left knee 05/27/2020  . DDD (degenerative disc disease), lumbar 05/27/2020   Past Surgical History:  Procedure Laterality Date  . APPENDECTOMY    . HERNIA REPAIR     Left  . KNEE ARTHROSCOPY     Left   Family History  Problem Relation Age of Onset  . Heart attack Mother 52  . Lung cancer Father   . Alzheimer's disease Brother   . Heart attack Maternal Grandfather 75   Social History   Tobacco Use  . Smoking status: Never Smoker  . Smokeless tobacco: Never Used  Substance Use Topics  . Alcohol  use: Not Currently   Allergies  Allergen Reactions  . Dust Mite Extract    Current Meds  Medication Sig  . aspirin 81 MG EC tablet Take by mouth.   Marland Kitchen atorvastatin (LIPITOR) 20 MG tablet TAKE 1 TABLET BY MOUTH AT BEDTIME  . busPIRone (BUSPAR) 5 MG tablet Take 1 tablet (5 mg total) by mouth 2 (two) times daily as needed (anxiety).  . celecoxib (CELEBREX) 200 MG capsule Take 200 mg by mouth 2 (two) times daily.  . cetirizine (ZYRTEC) 10 MG tablet Take by mouth.  . chlorthalidone (HYGROTON) 25 MG tablet Take 25 mg by mouth daily.  . cholecalciferol (VITAMIN D3) 25 MCG (1000 UNIT) tablet Take 1,000 Units by mouth every other day.  . cyanocobalamin 1000 MCG tablet Take by  mouth.   . empagliflozin (JARDIANCE) 25 MG TABS tablet Take 25 mg by mouth daily.  . fenofibrate micronized (LOFIBRA) 134 MG capsule Take by mouth.  . finasteride (PROSCAR) 5 MG tablet Take 1 tablet (5 mg total) by mouth every evening.  . hydrALAZINE (APRESOLINE) 100 MG tablet Take by mouth.  . Insulin Glargine (TOUJEO SOLOSTAR [place]) Inject 50 Units into the skin every evening.  . insulin lispro (HUMALOG) 100 UNIT/ML injection Inject 13 Units into the skin 2 (two) times daily with a meal.  . lansoprazole (PREVACID) 30 MG capsule One qd in am 30 min before meals  . losartan (COZAAR) 50 MG tablet Take 50 mg by mouth daily.  . metFORMIN (GLUCOPHAGE-XR) 500 MG 24 hr tablet Take 1,000 mg by mouth 2 (two) times daily with a meal.  . Omega-3 Fatty Acids (FISH OIL) 1000 MG CAPS Take by mouth in the morning and at bedtime.  Marland Kitchen OZEMPIC, 0.25 OR 0.5 MG/DOSE, 2 MG/1.5ML SOPN Inject 0.375 mLs (0.5 mg total) into the skin once a week.  . pioglitazone (ACTOS) 15 MG tablet Take 15 mg by mouth daily.  . propranolol (INDERAL) 20 MG tablet Take 20 mg by mouth daily.  . tamsulosin (FLOMAX) 0.4 MG CAPS capsule Take 0.4 mg by mouth at bedtime.  . traZODone (DESYREL) 100 MG tablet TAKE 1 TABLET BY MOUTH AT BEDTIME  . venlafaxine XR (EFFEXOR-XR) 150 MG 24 hr capsule Take 150 mg by mouth daily with breakfast.   Immunization History  Administered Date(s) Administered  . Moderna SARS-COVID-2 Vaccination 02/09/2020, 03/13/2020  . Tdap 01/16/2018      Objective:   Physical Exam BP 138/80 (BP Location: Left Arm, Cuff Size: Normal)   Pulse 93   Temp (!) 97.1 F (36.2 C) (Temporal)   Ht 5\' 11"  (1.803 m)   Wt (!) 308 lb 3.2 oz (139.8 kg)   SpO2 96%   BMI 42.99 kg/m  GENERAL: Morbidly obese man, no acute distress.  Fully ambulatory. HEAD: Normocephalic, atraumatic.  EYES: Pupils equal, round, reactive to light.  No scleral icterus.  MOUTH: Nose/mouth/throat not examined due to masking requirements for COVID  19. NECK: Supple. No thyromegaly. Trachea midline. No JVD.  No adenopathy. PULMONARY: Good air entry bilaterally.  No adventitious sounds. CARDIOVASCULAR: S1 and S2. Regular rate and rhythm.  ABDOMEN:  Significant truncal obesity. MUSCULOSKELETAL: No joint deformity, no clubbing, no edema.  NEUROLOGIC: No focal deficit, no gait disturbance, speech is fluent. SKIN: Intact,warm,dry.  No rashes noted. PSYCH: Mood and behavior normal    Assessment & Plan:     ICD-10-CM   1. Shortness of breath  R06.02 ECHOCARDIOGRAM COMPLETE    Pulmonary Function Test [facility] Only  ECHOCARDIOGRAM COMPLETE   Suspect multifactorial Obesity with OHVS a possibility Query diastolic dysfunction PFTs, 2D echo  2. Morbid obesity with BMI of 40.0-44.9, adult ([place])  E66.01    Z68.41    This issue adds complexity to his management Be causing pseudo restriction due to decreased abdominal compliance  3. Obstructive sleep apnea on CPAP  G47.33    Z99.89    Obtained old sleep study CPAP referral sent to DME    Orders Placed This Encounter  Procedures  . Pulmonary Function Test [facility] Only    Standing Status:   Future    Standing Expiration Date:   10/08/2021    Scheduling Instructions:     Next available.    Order Specific Question:   Full PFT: includes the following: basic spirometry, spirometry pre & post bronchodilator, diffusion capacity (DLCO), lung volumes    Answer:   Full PFT  . ECHOCARDIOGRAM COMPLETE    Order Specific Question:   Where should this test be performed    Answer:   [facility]    Order Specific Question:   Please indicate who you request to read the echo results.    Answer:   [facility]    Order Specific Question:   Perflutren DEFINITY (image enhancing agent) should be administered unless hypersensitivity or allergy exist    Answer:   Administer Perflutren    Order Specific Question:   Reason for exam-Echo    Answer:   Dyspnea  786.09 / R06.00  . ECHOCARDIOGRAM COMPLETE     Standing Status:   Future    Standing Expiration Date:   04/07/2021    Order Specific Question:   Where should this test be performed    Answer:   [facility]    Order Specific Question:   Please indicate who you request to read the echo results.    Answer:   [facility]    Order Specific Question:   Perflutren DEFINITY (image enhancing agent) should be administered unless hypersensitivity or allergy exist    Answer:   Administer Perflutren    Order Specific Question:   Is a special reader required? (athlete or structural heart)    Answer:   No    Order Specific Question:   Reason for exam-Echo    Answer:   Dyspnea  786.09 / R06.00   Will evaluate the patient's dyspnea with PFTs and echocardiogram.  The patient will also need new CPAP equipment.  A copy of his sleep study from [place] will be obtained.  We will see the patient in follow-up in 4 to 6 weeks time he is to contact us prior to that time should any new difficulties arise.  Renold Don, MD [facility]   *This note was dictated using voice recognition software/Dragon.  Despite best efforts to proofread, errors can occur which can change the meaning.  Any change was purely unintentional.

## 2020-10-09 NOTE — Telephone Encounter (Signed)
Sleep study has been received and reviewed by Dr. Patsey Berthold.  Recommend order for replacement cpap with local DME. Pressure of 5-20cm h2o. Order has been placed. Patient is aware and voiced his understanding.  Nothing further needed

## 2020-10-10 ENCOUNTER — Other Ambulatory Visit: Payer: Self-pay

## 2020-10-10 ENCOUNTER — Encounter: Payer: Self-pay | Admitting: Family Medicine

## 2020-10-10 ENCOUNTER — Telehealth: Payer: Self-pay | Admitting: Family Medicine

## 2020-10-10 ENCOUNTER — Ambulatory Visit (INDEPENDENT_AMBULATORY_CARE_PROVIDER_SITE_OTHER): Payer: Medicare Other | Admitting: Family Medicine

## 2020-10-10 VITALS — BP 137/80 | HR 79 | Temp 97.7°F | Resp 16 | Ht 71.0 in | Wt 306.6 lb

## 2020-10-10 DIAGNOSIS — Z23 Encounter for immunization: Secondary | ICD-10-CM | POA: Diagnosis not present

## 2020-10-10 DIAGNOSIS — G5622 Lesion of ulnar nerve, left upper limb: Secondary | ICD-10-CM | POA: Diagnosis not present

## 2020-10-10 DIAGNOSIS — M8949 Other hypertrophic osteoarthropathy, multiple sites: Secondary | ICD-10-CM

## 2020-10-10 DIAGNOSIS — M159 Polyosteoarthritis, unspecified: Secondary | ICD-10-CM

## 2020-10-10 DIAGNOSIS — E1169 Type 2 diabetes mellitus with other specified complication: Secondary | ICD-10-CM

## 2020-10-10 DIAGNOSIS — Z794 Long term (current) use of insulin: Secondary | ICD-10-CM

## 2020-10-10 DIAGNOSIS — R202 Paresthesia of skin: Secondary | ICD-10-CM

## 2020-10-10 LAB — POCT GLYCOSYLATED HEMOGLOBIN (HGB A1C): Hemoglobin A1C: 7.4 % — AB (ref 4.0–5.6)

## 2020-10-10 MED ORDER — INSULIN PEN NEEDLE 31G X 5 MM MISC: 3 refills | Status: DC

## 2020-10-10 MED ORDER — BACLOFEN 10 MG PO TABS: 5.0000 mg | ORAL_TABLET | Freq: Three times a day (TID) | ORAL | 1 refills | Status: DC | PRN

## 2020-10-10 NOTE — Patient Instructions (Addendum)
Thank you for coming to the office today.   Start taking Baclofen (Lioresal) 10mg  (muscle relaxant) - start with half (cut) to one whole pill at night as needed for next 1-3 nights (may make you drowsy, caution with driving) see how it affects you, then if tolerated increase to one pill 2 to 3 times a day or (every 8 hours as needed)  Keep taking Celebrex  Take Tylenol as needed 500-1000mg  2-3 times a day as need.  Stay tuned for update  [facility] - Neurology Dept [place], [place] [address] Phone: [phone]  Har[facility] Asa pharmacist will call for financial assistance on Ozempic. Future may do 1mg  We can try to get another sample in a month if you need.  Please schedule a Follow-up Appointment to: Return in about 3 months (around 01/10/2021) for 3 month DM A1c, f/u nerve impingement.  If you have any other questions or concerns, please feel free to call the office or send a message through [place]. You may also schedule an earlier appointment if necessary.  Additionally, you may be receiving a survey about your experience at our office within a few days to 1 week by e-mail or mail. We value your feedback.  Nobie Putnam, DO [place]

## 2020-10-10 NOTE — Telephone Encounter (Signed)
Patient is asking for a New Rx for Insulin Pen needles, #400 quantity.

## 2020-10-10 NOTE — Progress Notes (Signed)
Subjective:    Patient ID: Eric Andrews, male    DOB: 1951/05/20, 69 y.o.   MRN: [id number]  Eric Andrews is a 69 y.o. male presenting on 10/10/2020 for Diabetes   HPI   PMH - Type 2 Diabetes, Morbid Obesity  Major Depression, recurrent moderate Generalized Anxiety Disorder Oldest daughter recently passed after terminal cancer. He has been coping and managing this for some time now, he is optimistic on improving his mental health moving forward.  CHRONIC DM, Type 2 Now in donut hole, Ozempic cost $>275 per month, he is using 0.5mg  dose, was unable to lower Toujeo much because had higher sugars. Back on same dose toujeo. Some weight and appetite improvement on ozempic, wants to continue - admits overeating at times. Stress eeating Meds:Humalog TIDSSI with meals, Toujeo 50 units daily, Jardiance 25mg , Pioglitazone 15mg  - On ozempic 0.5mg  weekly out now Reports good compliance. Tolerating well w/o side-effects Currently on ARB Denies hypoglycemia, polyuria, visual changes, numbness or tingling   Left Shoulder, paresthesia Sciatica Reports pain in left shoulder radiating paresthesia into Left hand. Onset 6 weeks ago. Initially thought was a pulled muscle. Taking Celebrex regularly. Some improvement.  Health Maintenance: Due for Flu Shot, will receive today    Depression screen [facility] 2/9 10/10/2020 09/02/2020 07/09/2020  Decreased Interest 1 3 0  Down, Depressed, Hopeless 1 3 1   PHQ - 2 Score 2 6 1   Altered sleeping 2 3 0  Tired, decreased energy 2 3 3   Change in appetite 3 3 3   Feeling bad or failure about yourself  2 3 3   Trouble concentrating 1 2 3   Moving slowly or fidgety/restless 2 2 0  Suicidal thoughts 1 1 0  PHQ-9 Score 15 23 13   Difficult doing work/chores Somewhat difficult Somewhat difficult Somewhat difficult   GAD 7 : Generalized Anxiety Score 10/10/2020 09/02/2020 06/25/2020  Nervous, Anxious, on Edge 3 3 2   Control/stop worrying 2 3 3   Worry too much -  different things 2 3 3   Trouble relaxing 3 3 3   Restless 1 1 1   Easily annoyed or irritable 3 3 1   Afraid - awful might happen 2 2 2   Total GAD 7 Score 16 18 15   Anxiety Difficulty Somewhat difficult Very difficult Somewhat difficult     Social History   Tobacco Use  . Smoking status: Never Smoker  . Smokeless tobacco: Never Used  Vaping Use  . Vaping Use: Never used  Substance Use Topics  . Alcohol use: Not Currently  . Drug use: Never    Review of Systems Per HPI unless specifically indicated above     Objective:    BP 137/80   Pulse 79   Temp 97.7 F (36.5 C) (Temporal)   Resp 16   Ht 5\' 11"  (1.803 m)   Wt (!) 306 lb 9.6 oz (139.1 kg)   SpO2 97%   BMI 42.76 kg/m   Wt Readings from Last 3 Encounters:  10/10/20 (!) 306 lb 9.6 oz (139.1 kg)  10/08/20 (!) 308 lb 3.2 oz (139.8 kg)  09/23/20 (!) 306 lb (138.8 kg)    Physical Exam Vitals and nursing note reviewed.  Constitutional:      General: He is not in acute distress.    Appearance: He is well-developed. He is obese. He is not diaphoretic.     Comments: Well-appearing, comfortable, cooperative  HENT:     Head: Normocephalic and atraumatic.  Eyes:     General:  Right eye: No discharge.        Left eye: No discharge.     Conjunctiva/sclera: Conjunctivae normal.  Neck:     Thyroid: No thyromegaly.  Cardiovascular:     Rate and Rhythm: Normal rate and regular rhythm.     Heart sounds: Normal heart sounds. No murmur heard.   Pulmonary:     Effort: Pulmonary effort is normal. No respiratory distress.     Breath sounds: Normal breath sounds. No wheezing or rales.  Musculoskeletal:        General: Normal range of motion.     Cervical back: Normal range of motion and neck supple.     Comments: Localized Left lower neck/upper back area with muscle spasm hypertonicity, has ROM of L shoulder, area of paresthesia is ulnar nerve pattern  Lymphadenopathy:     Cervical: No cervical adenopathy.  Skin:     General: Skin is warm and dry.     Findings: No erythema or rash.  Neurological:     Mental Status: He is alert and oriented to person, place, and time.  Psychiatric:        Behavior: Behavior normal.     Comments: Well groomed, good eye contact, normal speech and thoughts    Results for orders placed or performed in visit on 10/10/20  POCT glycosylated hemoglobin (Hb A1C)  Result Value Ref Range   Hemoglobin A1C 7.4 (A) 4.0 - 5.6 %      Assessment & Plan:   Problem List Items Addressed This Visit    Type 2 diabetes mellitus with other specified complication ([place]) - Primary    Improved F6O to 7.4 Complications - cataracts, hyperlipidemia, obesity, depression-  increases risk of future cardiovascular complications   Plan:  1. Discussion on med regimen today, ultimately advised my goal is to titrate him down or off insulin and onto newer med agents such as GLP1 - Keep on Ozempic 0.5mg  for now, in donut hole, will give sample today, good for 1 month - Unable to lower insulin Toujeo, now at this time. - future goal is to inc ozempic if can get financial assistance to 1mg  weekly - Continue other regimen still humalog - Continue Jardiance 25mg  daily, Pioglitazone 15mg  daily, Metformin XR 1000mg  BID (500 x 2) 2. Encourage improved lifestyle - low carb, low sugar diet, reduce portion size, continue improving regular exercise 3. Check CBG, bring log to next visit for review 4. Continue ASA, ARB, Statin 5. Advised to schedule DM ophtho exam, send record 6. Follow-up 3 months  Refer to [facility] pharmacy for med financial assistance Ozempic, medicare donut hole       Relevant Orders   POCT glycosylated hemoglobin (Hb A1C) (Completed)   Ambulatory referral to Chronic Care Management Services   Primary osteoarthritis involving multiple joints   Relevant Medications   baclofen (LIORESAL) 10 MG tablet    Other Visit Diagnoses    Needs flu shot       Relevant Orders   Flu Vaccine QUAD High  Dose(Fluad) (Completed)   Ulnar nerve impingement, left       Relevant Medications   baclofen (LIORESAL) 10 MG tablet   Other Relevant Orders   Ambulatory referral to Neurology   Paresthesia of left upper extremity       Relevant Medications   baclofen (LIORESAL) 10 MG tablet   Other Relevant Orders   Ambulatory referral to Neurology      #LUE Paresthesia, ulnar nerve impingement Clinically suggestive of  nerve impingement, and spinal OA/DJD with neck/back symptoms as well. - Trial on Baclofen 5-10mg  TID PRN caution reviewed - future reconsider Prednisone taper if indicated, risk of hyperglycemia - Referral to [facility] for further management, may warrant NCS and injection therapy  Orders Placed This Encounter  Procedures  . Flu Vaccine QUAD High Dose(Fluad)  . Ambulatory referral to Chronic Care Management Services    Referral Priority:   Routine    Referral Type:   Consultation    Referral Reason:   Care Coordination    Number of Visits Requested:   1  . Ambulatory referral to Neurology    Referral Priority:   Routine    Referral Type:   Consultation    Referral Reason:   Specialty Services Required    Requested Specialty:   Neurology    Number of Visits Requested:   1  . POCT glycosylated hemoglobin (Hb A1C)     Meds ordered this encounter  Medications  . baclofen (LIORESAL) 10 MG tablet    Sig: Take 0.5-1 tablets (5-10 mg total) by mouth 3 (three) times daily as needed for muscle spasms.    Dispense:  30 each    Refill:  1      Follow up plan: Return in about 3 months (around 01/10/2021) for 3 month DM A1c, f/u nerve impingement.   Nobie Putnam, [facility] 10/10/2020, 11:09 AM

## 2020-10-10 NOTE — Assessment & Plan Note (Signed)
Improved Z8H to 7.4 Complications - cataracts, hyperlipidemia, obesity, depression-  increases risk of future cardiovascular complications   Plan:  1. Discussion on med regimen today, ultimately advised my goal is to titrate him down or off insulin and onto newer med agents such as GLP1 - Keep on Ozempic 0.5mg  for now, in donut hole, will give sample today, good for 1 month - Unable to lower insulin Toujeo, now at this time. - future goal is to inc ozempic if can get financial assistance to 1mg  weekly - Continue other regimen still humalog - Continue Jardiance 25mg  daily, Pioglitazone 15mg  daily, Metformin XR 1000mg  BID (500 x 2) 2. Encourage improved lifestyle - low carb, low sugar diet, reduce portion size, continue improving regular exercise 3. Check CBG, bring log to next visit for review 4. Continue ASA, ARB, Statin 5. Advised to schedule DM ophtho exam, send record 6. Follow-up 3 months  Refer to [facility] pharmacy for med financial assistance Ozempic, medicare donut hole

## 2020-10-10 NOTE — Telephone Encounter (Signed)
Patient requesting pen needles quantity of 400 instead of 300, please send new rx to   [facility], [address]  [place], [place] [id number]   Patient would like a follow up call when completed

## 2020-10-11 ENCOUNTER — Telehealth: Payer: Self-pay

## 2020-10-11 NOTE — Chronic Care Management (AMB) (Signed)
  Chronic Care Management   Note  10/11/2020 Name: Eric Andrews MRN: [id number] DOB: November 23, 1951  Eric Andrews is a 69 y.o. year old male who is a primary care patient of [name], DO. I reached out to [employer] by phone today in response to a referral sent by [name]'s PCP, Dr. [name]     [name] was given information about Chronic Care Management services today including:  1. CCM service includes personalized support from designated clinical staff supervised by his physician, including individualized plan of care and coordination with other care providers 2. 24/7 contact phone numbers for assistance for urgent and routine care needs. 3. Service will only be billed when office clinical staff spend 20 minutes or more in a month to coordinate care. 4. Only one practitioner may furnish and bill the service in a calendar month. 5. The patient may stop CCM services at any time (effective at the end of the month) by phone call to the office staff. 6. The patient will be responsible for cost sharing (co-pay) of up to 20% of the service fee (after annual deductible is met).  Patient agreed to services and verbal consent obtained.   Follow up plan: Telephone appointment with care management team member scheduled [id number] D 11/06/2020  SIGNATURE

## 2020-10-16 ENCOUNTER — Ambulatory Visit
Admission: RE | Admit: 2020-10-16 | Discharge: 2020-10-16 | Disposition: A | Discharge status: Home / Self Care | Payer: Medicare Other | Source: Ambulatory Visit | Attending: Pulmonary Disease | Admitting: Pulmonary Disease

## 2020-10-16 ENCOUNTER — Other Ambulatory Visit: Payer: Self-pay

## 2020-10-16 DIAGNOSIS — R0602 Shortness of breath: Secondary | ICD-10-CM

## 2020-10-16 LAB — ECHOCARDIOGRAM COMPLETE
AR max vel: 2.89 cm2
AV Area VTI: 2.97 cm2
AV Area mean vel: 2.51 cm2
AV Mean grad: 3 mmHg
AV Peak grad: 5.1 mmHg
Ao pk vel: 1.13 m/s
Area-P 1/2: 3.45 cm2
S' Lateral: 3.08 cm

## 2020-10-16 NOTE — Progress Notes (Signed)
*  PRELIMINARY RESULTS* Echocardiogram 2D Echocardiogram has been performed.  Sherrie Sport 10/16/2020, 10:21 AM

## 2020-10-25 ENCOUNTER — Telehealth: Payer: Self-pay | Admitting: Pulmonary Disease

## 2020-10-25 NOTE — Telephone Encounter (Signed)
Patient is aware of date/time of covid test prior to PFT.  

## 2020-10-28 ENCOUNTER — Other Ambulatory Visit: Payer: Self-pay

## 2020-10-28 ENCOUNTER — Other Ambulatory Visit
Admission: RE | Admit: 2020-10-28 | Discharge: 2020-10-28 | Disposition: A | Discharge status: Home / Self Care | Payer: Medicare Other | Source: Ambulatory Visit | Attending: Pulmonary Disease | Admitting: Pulmonary Disease

## 2020-10-28 DIAGNOSIS — Z01812 Encounter for preprocedural laboratory examination: Secondary | ICD-10-CM | POA: Diagnosis not present

## 2020-10-28 DIAGNOSIS — Z20822 Contact with and (suspected) exposure to covid-19: Secondary | ICD-10-CM | POA: Insufficient documentation

## 2020-10-28 LAB — SARS CORONAVIRUS 2 (TAT 6-24 HRS): SARS Coronavirus 2: NEGATIVE

## 2020-10-29 ENCOUNTER — Other Ambulatory Visit: Payer: Self-pay

## 2020-10-29 ENCOUNTER — Ambulatory Visit: Payer: Medicare Other | Attending: Pulmonary Disease

## 2020-10-29 DIAGNOSIS — R0602 Shortness of breath: Secondary | ICD-10-CM | POA: Diagnosis not present

## 2020-10-29 MED ORDER — ALBUTEROL SULFATE (2.5 MG/3ML) 0.083% IN NEBU
2.5000 mg | INHALATION_SOLUTION | Freq: Once | RESPIRATORY_TRACT | Status: AC
  Administered 2020-10-29: 2.5 mg via RESPIRATORY_TRACT
  Filled 2020-10-29: qty 3

## 2020-11-03 LAB — PULMONARY FUNCTION TEST ARMC ONLY
DL/VA % pred: 101 %
DL/VA: 4.14 ml/min/mmHg/L
DLCO unc % pred: 98 %
DLCO unc: 26.79 ml/min/mmHg
FEF 25-75 Post: 3.47 L/sec
FEF 25-75 Pre: 2.7 L/sec
FEF2575-%Change-Post: 28 %
FEF2575-%Pred-Post: 130 %
FEF2575-%Pred-Pre: 101 %
FEV1-%Change-Post: 5 %
FEV1-%Pred-Post: 93 %
FEV1-%Pred-Pre: 88 %
FEV1-Post: 3.23 L
FEV1-Pre: 3.06 L
FEV1FVC-%Change-Post: 5 %
FEV1FVC-%Pred-Pre: 107 %
FEV6-%Change-Post: 0 %
FEV6-%Pred-Post: 87 %
FEV6-%Pred-Pre: 87 %
FEV6-Post: 3.85 L
FEV6-Pre: 3.85 L
FEV6FVC-%Pred-Post: 105 %
FEV6FVC-%Pred-Pre: 105 %
FVC-%Change-Post: 0 %
FVC-%Pred-Post: 82 %
FVC-%Pred-Pre: 82 %
FVC-Post: 3.85 L
FVC-Pre: 3.85 L
Post FEV1/FVC ratio: 84 %
Post FEV6/FVC ratio: 100 %
Pre FEV1/FVC ratio: 79 %
Pre FEV6/FVC Ratio: 100 %
RV % pred: 88 %
RV: 2.18 L
TLC % pred: 94 %
TLC: 6.81 L

## 2020-11-06 ENCOUNTER — Ambulatory Visit: Payer: Medicare Other | Admitting: Pharmacist

## 2020-11-06 DIAGNOSIS — E1169 Type 2 diabetes mellitus with other specified complication: Secondary | ICD-10-CM

## 2020-11-06 DIAGNOSIS — Z794 Long term (current) use of insulin: Secondary | ICD-10-CM

## 2020-11-06 NOTE — Patient Instructions (Addendum)
Thank you allowing the Chronic Care Management Team to be a part of your care! It was a pleasure speaking with you today!     CCM (Chronic Care Management) Team    Noreene Larsson RN, MSN, CCM Nurse Care Coordinator  [phone]   Harlow Asa PharmD  Clinical Pharmacist  [phone]   Eula Fried LCSW Clinical Social Worker [phone]  Visit Information  Patient Care Plan: Disease Management T2DM, osteoarthritis         Problem Identified: Disease Progression     Long-Range Goal: Disease Progression Prevented or Minimized   Start Date: 11/06/2020  Expected End Date: 01/05/2021  This Visit's Progress: On track  Priority: High  Note:   Current Barriers:   Unable to independently afford treatment regimen. Reports currently in coverage gap of [employer] Part D plan  Pharmacist Clinical Goal(s):   Over the next 60 days, patient will verbalize ability to afford treatment regimen. through collaboration with PharmD and provider.   Interventions:  Inter-disciplinary care team collaboration (see longitudinal plan of care)  Unsuccessful outreach attempt to patient. ? Receive call back from patient and schedule appointment to complete medication review and further discuss medication assistance needs  Medication Assistance  Patient reports currently in coverage gap of Part D plan and having difficulty with affording Ozempic, Toujeo and Humalog  Reports has all medications now and sufficient supply of each, except for Ozempic, to last through end of calendar year.  ? Patient interested in reviewing assistance options for long-term affordability of Ozempic, Toujeo, Humalog and Jardiance, starting with 2022 calendar year. Will discuss during next telephone call  Will follow up with office to determine whether further samples of Ozempic available from office to last patient through end of calendar year (while applying for assistance for next  year)  Patient Goals/Self-Care Activities  Over the next 60 days, patient will:  - collaborate with provider on medication access solutions  Follow Up Plan: Telephone follow up appointment with care management team member scheduled for: 12/10 at 10 am    Mr. Gangemi was given information about Chronic Care Management services today including:  1. CCM service includes personalized support from designated clinical staff supervised by his physician, including individualized plan of care and coordination with other care providers 2. 24/7 contact phone numbers for assistance for urgent and routine care needs. 3. Service will only be billed when office clinical staff spend 20 minutes or more in a month to coordinate care. 4. Only one practitioner may furnish and bill the service in a calendar month. 5. The patient may stop CCM services at any time (effective at the end of the month) by phone call to the office staff. 6. The patient will be responsible for cost sharing (co-pay) of up to 20% of the service fee (after annual deductible is met).  Patient agreed to services and verbal consent obtained.   The patient verbalized understanding of instructions, educational materials, and care plan provided today and declined offer to receive copy of patient instructions, educational materials, and care plan.   Harlow Asa, PharmD, [facility] [phone]

## 2020-11-06 NOTE — Chronic Care Management (AMB) (Signed)
Chronic Care Management   Pharmacy Note  11/06/2020 Name: Eric Andrews MRN: [id number] DOB: 11/07/1951   Subjective:  Eric Andrews is a 69 y.o. year old male who is a primary care patient of [name], DO. The CCM team was consulted for assistance with chronic disease management and care coordination needs.    Engaged with patient by telephone for brief initial call in response to provider referral for pharmacy case management and/or care coordination services.   Consent to Services:  Eric Andrews was given the following information about Chronic Care Management services today, agreed to services, and gave verbal consent: 1.CCM service includes personalized support from designated clinical staff supervised by _0 @ physician, including individualized plan of care and coordination with other care providers 2. 24/7 contact phone numbers for assistance for urgent and routine care needs. 3. Service will only be billed when office clinical staff spend 20 minutes or more in a month to coordinate care. 4. Only one practitioner may furnish and bill the service in a calendar month. 5.The patient may stop CCM services at any time (effective at the end of the month) by phone call to the office staff. 6. The patient will be responsible for cost sharing (co-pay) of up to 20% of the service fee (after annual deductible is met).  SDOH (Social Determinants of Health) assessments and interventions performed:  No  Objective:  Lab Results  Component Value Date   CREATININE 1.08 06/18/2020   CREATININE 1.1 12/19/2019    Lab Results  Component Value Date   HGBA1C 7.4 (A) 10/10/2020       Component Value Date/Time   CHOL 114 06/18/2020 0857   TRIG 181 (H) 06/18/2020 0857   HDL 34 (L) 06/18/2020 0857   CHOLHDL 3.4 06/18/2020 0857   LDLCALC 54 06/18/2020 0857    Clinical ASCVD: No  The ASCVD Risk score Mikey Bussing DC Jr., et al., 2013) failed to calculate for the following reasons:   The  valid total cholesterol range is 130 to 320 mg/dL     BP Readings from Last 3 Encounters:  10/10/20 137/80  10/08/20 138/80  09/23/20 122/74    Assessment/Interventions: Review of patient past medical history, allergies, medications, health status, including review of consultants reports, laboratory and other test data, was performed as part of comprehensive evaluation and provision of chronic care management services.   Allergies  Allergen Reactions  . Dust Mite Extract     Medications Reviewed Today    Reviewed by [name], DO (Physician) on 10/10/20 at 1109  Med List Status: <None>  Medication Order Taking? Sig Documenting Provider Last Dose Status Informant  aspirin 81 MG EC tablet [id number] Yes Take by mouth.  [provider] Taking Active   atorvastatin (LIPITOR) 20 MG tablet [id number] Yes TAKE 1 TABLET BY MOUTH AT BEDTIME Karamalegos, [name], DO Taking Active   busPIRone (BUSPAR) 5 MG tablet [id number] Yes Take 1 tablet (5 mg total) by mouth 2 (two) times daily as needed (anxiety). [name], DO Taking Active   celecoxib (CELEBREX) 200 MG capsule [id number] Yes Take 200 mg by mouth 2 (two) times daily. [provider] Taking Active   cetirizine (ZYRTEC) 10 MG tablet [id number] Yes Take by mouth. [provider] Taking Active   chlorthalidone (HYGROTON) 25 MG tablet [id number] Yes Take 25 mg by mouth daily. [provider] Taking Active   cholecalciferol (VITAMIN D3) 25 MCG (1000 UNIT) tablet [id number] Yes Take 1,000 Units by mouth  every other day. [provider] Taking Active   cyanocobalamin 1000 MCG tablet [id number] No Take by mouth.   Patient not taking: Reported on 10/10/2020   [provider] Not Taking Active   empagliflozin (JARDIANCE) 25 MG TABS tablet [id number] Yes Take 25 mg by mouth daily. [provider] Taking Active   fenofibrate micronized (LOFIBRA) 134 MG capsule  [id number] Yes Take by mouth. [provider] Taking Active   finasteride (PROSCAR) 5 MG tablet [id number] Yes Take 1 tablet (5 mg total) by mouth every evening. [name], DO Taking Active   hydrALAZINE (APRESOLINE) 100 MG tablet [id number] Yes Take by mouth. [provider] Taking Active   Insulin Glargine (TOUJEO SOLOSTAR [place]) [id number] Yes Inject 50 Units into the skin every evening. [provider] Taking Active   insulin lispro (HUMALOG) 100 UNIT/ML injection [id number] Yes Inject 13 Units into the skin 2 (two) times daily with a meal. [provider] Taking Active   lansoprazole (PREVACID) 30 MG capsule [id number] Yes One qd in am 30 min before meals Rodriguez-Southworth, Sunday Spillers, PA-C Taking Active   losartan (COZAAR) 50 MG tablet [id number] Yes Take 50 mg by mouth daily. [provider] Taking Active   metFORMIN (GLUCOPHAGE-XR) 500 MG 24 hr tablet [id number] Yes Take 1,000 mg by mouth 2 (two) times daily with a meal. Karamalegos, [name], DO Taking Active   Omega-3 Fatty Acids (FISH OIL) 1000 MG CAPS [id number] Yes Take by mouth in the morning and at bedtime. [provider] Taking Active   OZEMPIC, 0.25 OR 0.5 MG/DOSE, 2 MG/1.5ML SOPN [id number] Yes Inject 0.375 mLs (0.5 mg total) into the skin once a week. [name], DO Taking Active   pioglitazone (ACTOS) 15 MG tablet [id number] Yes Take 15 mg by mouth daily. [provider] Taking Active   propranolol (INDERAL) 20 MG tablet [id number] Yes Take 20 mg by mouth daily. [provider] Taking Active   tamsulosin (FLOMAX) 0.4 MG CAPS capsule [id number] Yes Take 0.4 mg by mouth at bedtime. [provider] Taking Active   traZODone (DESYREL) 100 MG tablet [id number] Yes TAKE 1 TABLET BY MOUTH AT BEDTIME Karamalegos, [name], DO Taking Active   venlafaxine XR (EFFEXOR-XR) 150 MG 24 hr capsule [id number] Yes Take 150 mg by mouth daily with  breakfast. [provider] Taking Active           Patient Active Problem List   Diagnosis Date Noted  . Morbid obesity with BMI of 40.0-44.9, adult ([place]) 06/26/2020  . Major depressive disorder, recurrent, moderate ([place]) 06/25/2020  . GAD (generalized anxiety disorder) 06/25/2020  . Type 2 diabetes mellitus with other specified complication ([place]) 45/85/9292  . Chronic pain of right knee 05/27/2020  . Primary osteoarthritis involving multiple joints 05/27/2020  . Cataract associated with type 2 diabetes mellitus ([place]) 05/27/2020  . History of torn meniscus of left knee 05/27/2020  . DDD (degenerative disc disease), lumbar 05/27/2020     Patient Care Plan: Disease Management T2DM, osteoarthritis    Problem Identified: Disease Progression     Long-Range Goal: Disease Progression Prevented or Minimized   Start Date: 11/06/2020  Expected End Date: 01/05/2021  This Visit's Progress: On track  Priority: High  Note:   Current Barriers:  . Unable to independently afford treatment regimen. Reports currently in coverage gap of [employer] Part D plan  Pharmacist Clinical Goal(s):  Marland Kitchen Over the next 60 days, patient will verbalize ability to afford treatment regimen.  through collaboration with PharmD and provider.   Interventions: . Inter-disciplinary care team collaboration (see longitudinal plan of care) . Unsuccessful outreach attempt to patient. o Receive call back from patient and schedule appointment to complete medication review and further discuss medication assistance needs  Medication Assistance . Patient reports currently in coverage gap of Part D plan and having difficulty with affording Ozempic, Toujeo and Humalog . Reports has all medications now and sufficient supply of each, except for Ozempic, to last through end of calendar year.  o Patient interested in reviewing assistance options for long-term affordability of Ozempic, Toujeo, Humalog and  Jardiance, starting with 2022 calendar year. Will discuss during next telephone call . Will follow up with office to determine whether further samples of Ozempic available from office to last patient through end of calendar year (while applying for assistance for next year)  Patient Goals/Self-Care Activities . Over the next 60 days, patient will:  - collaborate with provider on medication access solutions  Follow Up Plan: Telephone follow up appointment with care management team member scheduled for: 12/10 at 10 am     Harlow Asa, PharmD, [place] [phone]

## 2020-11-15 ENCOUNTER — Ambulatory Visit: Payer: Medicare Other | Admitting: Pharmacist

## 2020-11-15 ENCOUNTER — Other Ambulatory Visit: Payer: Self-pay | Admitting: Family Medicine

## 2020-11-15 ENCOUNTER — Telehealth: Payer: Self-pay

## 2020-11-15 DIAGNOSIS — M8949 Other hypertrophic osteoarthropathy, multiple sites: Secondary | ICD-10-CM

## 2020-11-15 DIAGNOSIS — M159 Polyosteoarthritis, unspecified: Secondary | ICD-10-CM

## 2020-11-15 DIAGNOSIS — E1169 Type 2 diabetes mellitus with other specified complication: Secondary | ICD-10-CM

## 2020-11-15 NOTE — Progress Notes (Signed)
Pt has been scheduled.  °

## 2020-11-15 NOTE — Chronic Care Management (AMB) (Signed)
  Chronic Care Management   Note  11/15/2020 Name: Addiel Mccardle MRN: [id number] DOB: Jun 24, 1951  Dino Borntreger is a 69 y.o. year old male who is a primary care patient of [name], DO. Kamin Niblack is currently enrolled in care management services. An additional referral for LCSW was placed.   Follow up plan: Telephone appointment with care management team member scheduled for:11/19/2020  Noreene Larsson, [place], [place],  [address] Direct Dial: [phone] [email or web address] Website: [email or web address]

## 2020-11-15 NOTE — Patient Instructions (Signed)
Thank you allowing the Chronic Care Management Team to be a part of your care! It was a pleasure speaking with you today!     CCM (Chronic Care Management) Team    Noreene Larsson RN, MSN, CCM Nurse Care Coordinator  [phone]   Harlow Asa PharmD  Clinical Pharmacist  [phone]   Eula Fried LCSW Clinical Social Worker [phone]  Visit Information  Patient Care Plan: Disease Management T2DM, osteoarthritis    Problem Identified: Disease Progression     Long-Range Goal: Disease Progression Prevented or Minimized   Start Date: 11/06/2020  Expected End Date: 01/05/2021  Recent Progress: On track  Priority: High  Note:   Current Barriers:  . Unable to independently afford treatment regimen. Reports currently in coverage gap of [employer] Part D plan  Pharmacist Clinical Goal(s):  Marland Kitchen Over the next 60 days, patient will verbalize ability to afford treatment regimen. through collaboration with PharmD and provider.   Interventions: . Inter-disciplinary care team collaboration (see longitudinal plan of care) . Comprehensive medication review performed; medication list updated in electronic medical record o Reports uses weekly pillbox to organize his medications - Encourage patient to use additional reminder such as phone alarm to aid with adherence for once weekly Ozempic o Reports stopped taking lansoprazole  - Note patient started on lansoprazole once daily by ED provider on 10/7 for suspected silent GERD when seen for chest pain - Patient reports did not continue as denies any symptoms o Reports takes celecoxib dose consistently with food.  - Discuss monitoring for bruising/bleeding, particularly as taking in combination with daily aspirin . Will send referral to CCM Social Worker - patient interested in speaking about his anxiety/strategies for management  Type 2 Diabetes  . Reports taking: o Jardiance 10 mg daily (reports previously on 25 mg  daily, but taking from previous dose supply due to cost of refilling since in coverage gap) - Remind patient to verify supply in-date o Ozempic 0.5 mg weekly (on Mondays) o Toujeo 60 units daily in evening (reports self-adjusted back up when home CBGs elevated) o Humalog - Reports taking three times daily with meals following sliding scale from previous Endocrinologist in [place] - Reports sliding scale based on blood sugar prior to each meal: . If less than 140: takes 13 units . If 140-200: takes 14 units . If 200-250: takes 15 units . If 250 or above: takes 16 units . Reports recent home blood sugars ranging: fasting readings 120-170 and before supper readings: 170-200 o Denies recent low blood sugars o Counsel patient on s/s of low blood sugar and how to treat lows . Reports has started eating more snacks throughout day since retired o Reports link between his anxiety and eating  o Reports working on limiting portion sizes . Counsel on importance of dietary choices in blood sugar control . Encourage patient to continue to monitor home CBGs, to keep log of results and to bring log with him to medical appointments  Hypertension . Reports taking: o Chlorthalidone 25 mg daily o Losartan 50 mg daily o Reports NOT currently taking hydralazine (does not believe he has taken in >6 months) or propranolol (not taking in >1 month) . Reports monitors home blood pressure with wrist monitor . Recalls recent readings running ~120/70s-80s . Encourage patient to obtain upper arm blood pressure monitor o Counsel on option of using health plan OTC benefit  . Encourage patient to monitor home blood pressure, keep log of results and bring  log with him to medical appointments . Will mail patient blood pressure log, as well as educational handout on monitoring technique, as requested  Medication Assistance . Patient reports currently in coverage gap of Part D plan and having difficulty with affording  Ozempic, Toujeo, Humalog and Jardiance . Reports has all medications now and sufficient supply of each to last through end of calendar year.  o Confirms picked up sample of Ozempic from office last week . Review medication assistance options o Based on reported household income, does not meet requirement for extra help subsidy o Based on reported income, does meet income requirement to apply for patient assistance for Ozempic from [employer] and [place] from [employer] - Note Novolog (therapeutic alternative to Humalog) and [place] (therapeutic alternative to [employer]) available from [employer] . Patient interested in making these therapeutic changes if Okay with PCP . Collaborate with PCP via secure message. Provider confirms okay to switch patient's Humalog to Novolog and Toujeo to [place], so patient can also apply to receive assistance for these through same [employer] . Will collaborate with Homer Simcox for assistance to patient with completing assistance applications with [employer] and [employer] for 2022 calendar year  Patient Goals/Self-Care Activities . Over the next 60 days, patient will:  - collaborate with provider on medication access solutions -patient to call office for new or worsening medical concerns  Follow Up Plan: Telephone follow up appointment with care management team member scheduled for: 12/09/2020 at 10 am     The patient verbalized understanding of instructions, educational materials, and care plan provided today and declined offer to receive copy of patient instructions, educational materials, and care plan.    Harlow Asa, PharmD, [place] [employer] [phone]

## 2020-11-15 NOTE — Chronic Care Management (AMB) (Signed)
Chronic Care Management   Pharmacy Note  11/15/2020 Name: [name] MRN: [id number] DOB: 01-15-1951   Subjective:  Eric Andrews is a 69 y.o. year old male who is a primary care patient of [name], DO. The CCM team was consulted for assistance with chronic disease management and care coordination needs.    Engaged with patient by telephone for follow up visit in response to provider referral for pharmacy case management and/or care coordination services.   SDOH (Social Determinants of Health) assessments and interventions performed:  None  Objective:  Lab Results  Component Value Date   CREATININE 1.08 06/18/2020   CREATININE 1.1 12/19/2019    Lab Results  Component Value Date   HGBA1C 7.4 (A) 10/10/2020       Component Value Date/Time   CHOL 114 06/18/2020 0857   TRIG 181 (H) 06/18/2020 0857   HDL 34 (L) 06/18/2020 0857   CHOLHDL 3.4 06/18/2020 0857   LDLCALC 54 06/18/2020 0857    BP Readings from Last 3 Encounters:  10/10/20 137/80  10/08/20 138/80  09/23/20 122/74    Assessment/Interventions: Review of patient past medical history, allergies, medications, health status, including review of consultants reports, laboratory and other test data, was performed as part of comprehensive evaluation and provision of chronic care management services.   Allergies  Allergen Reactions  . Dust Mite Extract     Medications Reviewed Today    Reviewed by Vella Raring, RPH (Pharmacist) on 11/15/20 at 1050  Med List Status: <None>  Medication Order Taking? Sig Documenting Provider Last Dose Status Informant  aspirin 81 MG EC tablet [id number] Yes Take 81 mg by mouth daily. [provider] Taking Active   atorvastatin (LIPITOR) 20 MG tablet [id number] Yes TAKE 1 TABLET BY MOUTH AT BEDTIME Karamalegos, [name], DO Taking Active   busPIRone (BUSPAR) 5 MG tablet [id number] Yes Take 1 tablet (5 mg total) by mouth 2 (two) times daily as needed  (anxiety). [name], DO Taking Active   celecoxib (CELEBREX) 200 MG capsule [id number] Yes Take 200 mg by mouth 2 (two) times daily. [provider] Taking Active   cetirizine (ZYRTEC) 10 MG tablet [id number] Yes Take 10 mg by mouth at bedtime. [provider] Taking Active   chlorthalidone (HYGROTON) 25 MG tablet [id number] Yes Take 25 mg by mouth daily. [provider] Taking Active   cholecalciferol (VITAMIN D3) 25 MCG (1000 UNIT) tablet [id number] Yes Take 1,000 Units by mouth daily. [provider] Taking Active   empagliflozin (JARDIANCE) 25 MG TABS tablet [id number]  Take 25 mg by mouth daily. [provider]  Active   fenofibrate micronized (LOFIBRA) 134 MG capsule [id number] Yes Take by mouth. [provider] Taking Active   finasteride (PROSCAR) 5 MG tablet [id number] Yes Take 1 tablet (5 mg total) by mouth every evening. [name], DO Taking Active   hydrALAZINE (APRESOLINE) 100 MG tablet [id number] No Take by mouth.  Patient not taking: Reported on 11/15/2020   [provider] Not Taking Active   Insulin Glargine (TOUJEO SOLOSTAR [place]) [id number] Yes Inject 60 Units into the skin every evening. [provider] Taking Active   insulin lispro (HUMALOG) 100 UNIT/ML injection [id number] Yes Inject 13 Units into the skin 2 (two) times daily with a meal. [provider] Taking Active   Insulin Pen Needle 31G X 5 MM MISC [id number]  Use with insulin to inject into skin 3 times daily as directed [name], DO  Active   losartan (COZAAR) 50 MG tablet [id number] Yes Take 50 mg by mouth daily. [provider] Taking Active   metFORMIN (GLUCOPHAGE-XR) 500 MG 24 hr tablet [id number] Yes Take 1,000 mg by mouth 2 (two) times daily with a meal. Karamalegos, [name], DO Taking Active   Omega-3 Fatty Acids (FISH OIL) 1000 MG CAPS [id number] Yes Take by mouth in the morning and at  bedtime. [provider] Taking Active   OZEMPIC, 0.25 OR 0.5 MG/DOSE, 2 MG/1.5ML SOPN [id number] Yes Inject 0.375 mLs (0.5 mg total) into the skin once a week. [name], DO Taking Active            Med Note [facility], Hadrian Yarbrough A   Fri Nov 15, 2020 10:34 AM) On Mondays  pioglitazone (ACTOS) 15 MG tablet [id number] Yes Take 15 mg by mouth daily. [provider] Taking Active   propranolol (INDERAL) 20 MG tablet [id number] No Take 20 mg by mouth daily.  Patient not taking: Reported on 11/15/2020   [provider] Not Taking Active   tamsulosin (FLOMAX) 0.4 MG CAPS capsule [id number] Yes Take 0.4 mg by mouth at bedtime. [provider] Taking Active   traZODone (DESYREL) 100 MG tablet [id number] Yes TAKE 1 TABLET BY MOUTH AT BEDTIME Karamalegos, [name], DO Taking Active   venlafaxine XR (EFFEXOR-XR) 150 MG 24 hr capsule [id number]  Take 150 mg by mouth daily with breakfast. [provider]  Active           Patient Active Problem List   Diagnosis Date Noted  . Morbid obesity with BMI of 40.0-44.9, adult ([place]) 06/26/2020  . Major depressive disorder, recurrent, moderate ([place]) 06/25/2020  . GAD (generalized anxiety disorder) 06/25/2020  . Type 2 diabetes mellitus with other specified complication ([place]) 68/34/1962  . Chronic pain of right knee 05/27/2020  . Primary osteoarthritis involving multiple joints 05/27/2020  . Cataract associated with type 2 diabetes mellitus ([place]) 05/27/2020  . History of torn meniscus of left knee 05/27/2020  . DDD (degenerative disc disease), lumbar 05/27/2020    Patient Care Plan: Disease Management T2DM, osteoarthritis    Problem Identified: Disease Progression     Long-Range Goal: Disease Progression Prevented or Minimized   Start Date: 11/06/2020  Expected End Date: 01/05/2021  Recent Progress: On track  Priority: High  Note:   Current Barriers:  . Unable to independently afford treatment  regimen. Reports currently in coverage gap of [employer] Part D plan  Pharmacist Clinical Goal(s):  Marland Kitchen Over the next 60 days, patient will verbalize ability to afford treatment regimen. through collaboration with PharmD and provider.   Interventions: . Inter-disciplinary care team collaboration (see longitudinal plan of care) . Comprehensive medication review performed; medication list updated in electronic medical record o Reports uses weekly pillbox to organize his medications - Encourage patient to use additional reminder such as phone alarm to aid with adherence for once weekly Ozempic o Reports stopped taking lansoprazole  - Note patient started on lansoprazole once daily by ED provider on 10/7 for suspected silent GERD when seen for chest pain - Patient reports did not continue as denies any symptoms - Will collaborate with PCP o Reports takes celecoxib dose consistently with food.  - Discuss monitoring for bruising/bleeding, particularly as taking in combination with daily aspirin . Will send referral to CCM Social Worker - patient interested in speaking about his anxiety/strategies for management  Type 2 Diabetes  . Reports taking: o Jardiance 10 mg  daily (reports previously on 25 mg daily, but taking from previous dose supply due to cost of refilling since in coverage gap) - Remind patient to verify supply in-date o Ozempic 0.5 mg weekly (on Mondays) o Toujeo 60 units daily in evening (reports self-adjusted back up when home CBGs elevated) o Humalog - Reports taking three times daily with meals following sliding scale from previous Endocrinologist in [place] - Reports sliding scale based on blood sugar prior to each meal: . If less than 140: takes 13 units . If 140-200: takes 14 units . If 200-250: takes 15 units . If 250 or above: takes 16 units . Reports recent home blood sugars ranging: fasting readings 120-170 and before supper readings: 170-200 o Denies  recent low blood sugars o Counsel patient on s/s of low blood sugar and how to treat lows . Reports has started eating more snacks throughout day since retired o Reports link between his anxiety and eating  o Reports working on limiting portion sizes . Counsel on importance of dietary choices in blood sugar control . Encourage patient to continue to monitor home CBGs, to keep log of results and to bring log with him to medical appointments  Hypertension . Reports taking: o Chlorthalidone 25 mg daily o Losartan 50 mg daily o Reports NOT currently taking hydralazine (does not believe he has taken in >6 months) or propranolol (not taking in >1 month) . Reports monitors home blood pressure with wrist monitor . Recalls recent readings running ~120/70s-80s . Encourage patient to obtain upper arm blood pressure monitor o Counsel on option of using health plan OTC benefit  . Encourage patient to monitor home blood pressure, keep log of results and bring log with him to medical appointments . Will mail patient blood pressure log, as well as educational handout on monitoring technique, as requested  Medication Assistance . Patient reports currently in coverage gap of Part D plan and having difficulty with affording Ozempic, Toujeo, Humalog and Jardiance . Reports has all medications now and sufficient supply of each to last through end of calendar year.  o Confirms picked up sample of Ozempic from office last week . Review medication assistance options o Based on reported household income, does not meet requirement for extra help subsidy o Based on reported income, does meet income requirement to apply for patient assistance for Ozempic from [employer] and [place] from [employer] - Note Novolog (therapeutic alternative to Humalog) and [place] (therapeutic alternative to [employer]) available from [employer] . Patient interested in making these therapeutic changes if Okay with  PCP . Collaborate with PCP via secure message. Provider confirms okay to switch patient's Humalog to Novolog and Toujeo to [place], so patient can also apply to receive assistance for these through same [employer] . Will collaborate with Graton Simcox for assistance to patient with completing assistance applications with [employer] and [employer] for 2022 calendar year  Patient Goals/Self-Care Activities . Over the next 60 days, patient will:  - collaborate with provider on medication access solutions -patient to call office for new or worsening medical concerns  Follow Up Plan: Telephone follow up appointment with care management team member scheduled for: 12/09/2020 at 10 am    Harlow Asa, PharmD, [place] [phone]

## 2020-11-18 ENCOUNTER — Other Ambulatory Visit: Payer: Self-pay | Admitting: Family Medicine

## 2020-11-18 ENCOUNTER — Ambulatory Visit: Payer: Self-pay | Admitting: Pharmacist

## 2020-11-18 DIAGNOSIS — E1169 Type 2 diabetes mellitus with other specified complication: Secondary | ICD-10-CM

## 2020-11-18 NOTE — Chronic Care Management (AMB) (Signed)
Chronic Care Management   Pharmacy Note  11/18/2020 Name: Eric Andrews MRN: [id number] DOB: May 07, 1951   Subjective:  Eric Andrews is a 69 y.o. year old male who is a primary care patient of [name], DO. The CCM team was consulted for assistance with chronic disease management and care coordination needs.    Engaged with patient by telephone for follow up visit in response to provider referral for pharmacy case management and/or care coordination services.   Consent to Services:  Please see initial visit note for detailed documentation.   SDOH (Social Determinants of Health) assessments and interventions performed:  none  Objective:  Lab Results  Component Value Date   CREATININE 1.08 06/18/2020   CREATININE 1.1 12/19/2019    Lab Results  Component Value Date   HGBA1C 7.4 (A) 10/10/2020       Component Value Date/Time   CHOL 114 06/18/2020 0857   TRIG 181 (H) 06/18/2020 0857   HDL 34 (L) 06/18/2020 0857   CHOLHDL 3.4 06/18/2020 0857   LDLCALC 54 06/18/2020 0857    BP Readings from Last 3 Encounters:  10/10/20 137/80  10/08/20 138/80  09/23/20 122/74    Assessment/Interventions: Review of patient past medical history, allergies, medications, health status, including review of consultants reports, laboratory and other test data, was performed as part of comprehensive evaluation and provision of chronic care management services.   CCM Care Plan  Allergies  Allergen Reactions  . Dust Mite Extract     Medications Reviewed Today    Reviewed by Vella Raring, RPH (Pharmacist) on 11/15/20 at 1050  Med List Status: <None>  Medication Order Taking? Sig Documenting Provider Last Dose Status Informant  aspirin 81 MG EC tablet [id number] Yes Take 81 mg by mouth daily. [provider] Taking Active   atorvastatin (LIPITOR) 20 MG tablet [id number] Yes TAKE 1 TABLET BY MOUTH AT BEDTIME Karamalegos, [name], DO Taking Active   busPIRone  (BUSPAR) 5 MG tablet [id number] Yes Take 1 tablet (5 mg total) by mouth 2 (two) times daily as needed (anxiety). [name], DO Taking Active   celecoxib (CELEBREX) 200 MG capsule [id number] Yes Take 200 mg by mouth 2 (two) times daily. [provider] Taking Active   cetirizine (ZYRTEC) 10 MG tablet [id number] Yes Take 10 mg by mouth at bedtime. [provider] Taking Active   chlorthalidone (HYGROTON) 25 MG tablet [id number] Yes Take 25 mg by mouth daily. [provider] Taking Active   cholecalciferol (VITAMIN D3) 25 MCG (1000 UNIT) tablet [id number] Yes Take 1,000 Units by mouth daily. [provider] Taking Active   empagliflozin (JARDIANCE) 25 MG TABS tablet [id number]  Take 25 mg by mouth daily. [provider]  Active   fenofibrate micronized (LOFIBRA) 134 MG capsule [id number] Yes Take by mouth. [provider] Taking Active   finasteride (PROSCAR) 5 MG tablet [id number] Yes Take 1 tablet (5 mg total) by mouth every evening. [name], DO Taking Active   hydrALAZINE (APRESOLINE) 100 MG tablet [id number] No Take by mouth.  Patient not taking: Reported on 11/15/2020   [provider] Not Taking Active   Insulin Glargine (TOUJEO SOLOSTAR [place]) [id number] Yes Inject 60 Units into the skin every evening. [provider] Taking Active   insulin lispro (HUMALOG) 100 UNIT/ML injection [id number] Yes Inject 13 Units into the skin 2 (two) times daily with a meal. [provider] Taking Active   Insulin Pen Needle 31G X 5 MM MISC  [id number]  Use with insulin to inject into skin 3 times daily as directed [name], DO  Active   losartan (COZAAR) 50 MG tablet [id number] Yes Take 50 mg by mouth daily. [provider] Taking Active   metFORMIN (GLUCOPHAGE-XR) 500 MG 24 hr tablet [id number] Yes Take 1,000 mg by mouth 2 (two) times daily with a meal. Karamalegos, [name], DO Taking Active    Omega-3 Fatty Acids (FISH OIL) 1000 MG CAPS [id number] Yes Take by mouth in the morning and at bedtime. [provider] Taking Active   OZEMPIC, 0.25 OR 0.5 MG/DOSE, 2 MG/1.5ML SOPN [id number] Yes Inject 0.375 mLs (0.5 mg total) into the skin once a week. [name], DO Taking Active            Med Note [facility], Mckaila Duffus A   Fri Nov 15, 2020 10:34 AM) On Mondays  pioglitazone (ACTOS) 15 MG tablet [id number] Yes Take 15 mg by mouth daily. [provider] Taking Active   propranolol (INDERAL) 20 MG tablet [id number] No Take 20 mg by mouth daily.  Patient not taking: Reported on 11/15/2020   [provider] Not Taking Active   tamsulosin (FLOMAX) 0.4 MG CAPS capsule [id number] Yes Take 0.4 mg by mouth at bedtime. [provider] Taking Active   traZODone (DESYREL) 100 MG tablet [id number] Yes TAKE 1 TABLET BY MOUTH AT BEDTIME [name], DO Taking Active   venlafaxine XR (EFFEXOR-XR) 150 MG 24 hr capsule [id number]  Take 150 mg by mouth daily with breakfast. [provider]  Active           Patient Care Plan: Disease Management T2DM, osteoarthritis    Problem Identified: Disease Progression     Long-Range Goal: Disease Progression Prevented or Minimized   Start Date: 11/06/2020  Expected End Date: 01/05/2021  Recent Progress: On track  Priority: High  Note:   Current Barriers:  . Unable to independently afford treatment regimen. Reports currently in coverage gap of [employer] Part D plan  Pharmacist Clinical Goal(s):  Marland Kitchen Over the next 60 days, patient will verbalize ability to afford treatment regimen. through collaboration with PharmD and provider.   Interventions: . Inter-disciplinary care team collaboration (see longitudinal plan of care) . Collaborated with PCP regarding patient's hypertension and medication management o Provider confirms, based on latest office and reported home BP readings, okay  for patient to remain off of hydralazine and propranolol at this time (note patient reported has not been taking either for >1 month).  o Provider confirms okay with patient remaining off of lansoprazole for now.  . Follow up with patient today update with above recommendations from PCP. Patient verbalizes understanding and denies further medication questions/concerns today.  Medication Assistance . Will continue to collaborate with [facility] CPhT Susy Frizzle for assistance to patient with completing assistance applications with [employer] and [employer] for 2022 calendar year  Patient Goals/Self-Care Activities . Over the next 60 days, patient will:  - collaborate with provider on medication access solutions -patient to call office for new or worsening medical concerns  Follow Up Plan: Telephone follow up appointment with care management team member scheduled for: 12/09/2020 at 10 am           Harlow Asa, PharmD, [place] [phone]

## 2020-11-18 NOTE — Patient Instructions (Signed)
Thank you allowing the Chronic Care Management Team to be a part of your care! It was a pleasure speaking with you today!     CCM (Chronic Care Management) Team    Noreene Larsson RN, MSN, CCM Nurse Care Coordinator  [phone]   Harlow Asa PharmD  Clinical Pharmacist  [phone]   Eula Fried LCSW Clinical Social Worker [phone]  Visit Information  Patient Care Plan: Disease Management T2DM, osteoarthritis    Problem Identified: Disease Progression     Long-Range Goal: Disease Progression Prevented or Minimized   Start Date: 11/06/2020  Expected End Date: 01/05/2021  Recent Progress: On track  Priority: High  Note:   Current Barriers:  . Unable to independently afford treatment regimen. Reports currently in coverage gap of [employer] Part D plan  Pharmacist Clinical Goal(s):  Marland Kitchen Over the next 60 days, patient will verbalize ability to afford treatment regimen. through collaboration with PharmD and provider.   Interventions: . Inter-disciplinary care team collaboration (see longitudinal plan of care) . Collaborated with PCP regarding patient's hypertension and medication management o Provider confirms, based on latest office and reported home BP readings, okay for patient to remain off of hydralazine and propranolol at this time (note patient reported has not been taking either for >1 month).  o Provider confirms okay with patient remaining off of lansoprazole for now.  . Follow up with patient today update with above recommendations from PCP. Patient verbalizes understanding and denies further medication questions/concerns today.  Medication Assistance . Will continue to collaborate with [facility] CPhT Susy Frizzle for assistance to patient with completing assistance applications with [employer] and [employer] for 2022 calendar year  Patient Goals/Self-Care Activities . Over the next 60 days, patient will:  - collaborate with provider on  medication access solutions -patient to call office for new or worsening medical concerns  Follow Up Plan: Telephone follow up appointment with care management team member scheduled for: 12/09/2020 at 10 am     The patient verbalized understanding of instructions, educational materials, and care plan provided today and declined offer to receive copy of patient instructions, educational materials, and care plan.   Harlow Asa, PharmD, [place] [employer] [phone]

## 2020-11-19 ENCOUNTER — Ambulatory Visit: Payer: Medicare Other | Admitting: Licensed Clinical Social Worker

## 2020-11-19 DIAGNOSIS — F331 Major depressive disorder, recurrent, moderate: Secondary | ICD-10-CM

## 2020-11-19 DIAGNOSIS — M8949 Other hypertrophic osteoarthropathy, multiple sites: Secondary | ICD-10-CM

## 2020-11-19 DIAGNOSIS — M159 Polyosteoarthritis, unspecified: Secondary | ICD-10-CM

## 2020-11-19 DIAGNOSIS — E1169 Type 2 diabetes mellitus with other specified complication: Secondary | ICD-10-CM

## 2020-11-19 DIAGNOSIS — M5136 Other intervertebral disc degeneration, lumbar region: Secondary | ICD-10-CM

## 2020-11-19 DIAGNOSIS — F411 Generalized anxiety disorder: Secondary | ICD-10-CM

## 2020-11-19 DIAGNOSIS — Z794 Long term (current) use of insulin: Secondary | ICD-10-CM

## 2020-11-19 NOTE — Chronic Care Management (AMB) (Signed)
Chronic Care Management    Clinical Social Work Follow Up Note  11/19/2020 Name: Eric Andrews MRN: [id number] DOB: [birth year]  Eric Andrews is a 69 y.o. year old male who is a primary care patient of [name], DO. The CCM team was consulted for assistance with Mental Health Counseling and Resources.   Review of patient status, including review of consultants reports, other relevant assessments, and collaboration with appropriate care team members and the patient's provider was performed as part of comprehensive patient evaluation and provision of chronic care management services.    SDOH (Social Determinants of Health) assessments performed: Yes    Outpatient Encounter Medications as of 11/19/2020  Medication Sig Note  . aspirin 81 MG EC tablet Take 81 mg by mouth daily.   Marland Kitchen atorvastatin (LIPITOR) 20 MG tablet TAKE 1 TABLET BY MOUTH AT BEDTIME   . busPIRone (BUSPAR) 5 MG tablet Take 1 tablet (5 mg total) by mouth 2 (two) times daily as needed (anxiety).   . celecoxib (CELEBREX) 200 MG capsule Take 200 mg by mouth 2 (two) times daily.   . cetirizine (ZYRTEC) 10 MG tablet Take 10 mg by mouth at bedtime.   . chlorthalidone (HYGROTON) 25 MG tablet Take 25 mg by mouth daily.   . cholecalciferol (VITAMIN D3) 25 MCG (1000 UNIT) tablet Take 1,000 Units by mouth daily.   . empagliflozin (JARDIANCE) 25 MG TABS tablet Take 25 mg by mouth daily. 11/15/2020: Reports currently taking Jardiance 10 mg daily  . fenofibrate micronized (LOFIBRA) 134 MG capsule Take by mouth.   . finasteride (PROSCAR) 5 MG tablet Take 1 tablet (5 mg total) by mouth every evening.   . Insulin Glargine (TOUJEO SOLOSTAR [place]) Inject 50 Units into the skin every evening. 11/15/2020: Reports taking 60 units daily in evening  . insulin lispro (HUMALOG) 100 UNIT/ML injection Inject 13 Units into the skin 2 (two) times daily with a meal. 11/15/2020: Reports taking according to sliding scale from previous Endocrinologist  in [place]  . Insulin Pen Needle 31G X 5 MM MISC Use with insulin to inject into skin 3 times daily as directed   . losartan (COZAAR) 50 MG tablet Take 50 mg by mouth daily.   . metFORMIN (GLUCOPHAGE-XR) 500 MG 24 hr tablet Take 1,000 mg by mouth 2 (two) times daily with a meal.   . Multiple Vitamin (MULTIVITAMIN) capsule Take 1 capsule by mouth daily.   . Omega-3 Fatty Acids (FISH OIL) 1000 MG CAPS Take by mouth in the morning and at bedtime.   Marland Kitchen OZEMPIC, 0.25 OR 0.5 MG/DOSE, 2 MG/1.5ML SOPN Inject 0.375 mLs (0.5 mg total) into the skin once a week. 11/15/2020: On Mondays  . pioglitazone (ACTOS) 15 MG tablet Take 15 mg by mouth daily.   . tamsulosin (FLOMAX) 0.4 MG CAPS capsule Take 0.4 mg by mouth at bedtime.   . traZODone (DESYREL) 100 MG tablet TAKE 1 TABLET BY MOUTH AT BEDTIME   . venlafaxine XR (EFFEXOR-XR) 150 MG 24 hr capsule Take 150 mg by mouth daily with breakfast.    No facility-administered encounter medications on file as of 11/19/2020.     Goals Addressed    . SW-Manage My Emotions       Timeframe:  Long-Range Goal Priority:  Medium Start Date:  11/19/20                        Expected End Date: 02/18/20  Follow Up Date- 90 days from 11/19/20   - begin personal counseling - call and visit an old friend - check out volunteer opportunities - join a support group - laugh; watch a funny movie or comedian - learn and use visualization or guided imagery - perform a random act of kindness - practice relaxation or meditation daily - start or continue a personal journal - talk about feelings with a friend, family or spiritual advisor - practice positive thinking and self-talk    Why is this important?    When you are stressed, down or upset, your body reacts too.   For example, your blood pressure may get higher; you may have a headache or stomachache.   When your emotions get the best of you, your body's ability to fight off cold and flu gets weak.    These steps will help you manage your emotions.    Objective:  Depression screen [facility] 2/9 10/10/2020 09/02/2020 07/09/2020  Decreased Interest 1 3 0  Down, Depressed, Hopeless 1 3 1   PHQ - 2 Score 2 6 1   Altered sleeping 2 3 0  Tired, decreased energy 2 3 3   Change in appetite 3 3 3   Feeling bad or failure about yourself  2 3 3   Trouble concentrating 1 2 3   Moving slowly or fidgety/restless 2 2 0  Suicidal thoughts 1 1 0  PHQ-9 Score 15 23 13   Difficult doing work/chores Somewhat difficult Somewhat difficult Somewhat difficult    GAD 7 : Generalized Anxiety Score 10/10/2020 09/02/2020 06/25/2020  Nervous, Anxious, on Edge 3 3 2   Control/stop worrying 2 3 3   Worry too much - different things 2 3 3   Trouble relaxing 3 3 3   Restless 1 1 1   Easily annoyed or irritable 3 3 1   Afraid - awful might happen 2 2 2   Total GAD 7 Score 16 18 15   Anxiety Difficulty Somewhat difficult Very difficult Somewhat difficult   Assessment, progress and current barriers:  Patient is currently experiencing symptoms of  anxiety and depression which seems to be exacerbated by his chronic pain.   Lacks knowledge of where and how to connect for mental health support. LCSW will provide Support, Education, and Care Coordination in order to meet unmet need.   Clinical Goal(s): Over the next 120 days, patient will work with SW to reduce or manage symptoms of anxiety, compulsions, depression, insomnia, mood instability, and stress until connected for ongoing counseling.   Clinical Interventions:  . Assessed thoughts of SI, plan and access to means. Assessed patient's previous treatment, needs, coping skills, current treatment, support system and barriers to care . Patient interviewed and appropriate assessments performed: brief mental health assessment . Provided basic mental health support, education and interventions  . Discussed several options for long term counseling based on need and insurance. Assisted patient  with narrowing the options down to [facility].  . Reviewed mental health medications with patient prescribed by PCP and discussed compliance  . Referral placed to [facility] on 11/19/20 . Assisted patient/caregiver with obtaining information about health plan benefits . Provided education and assistance to client regarding Advanced Directives. . Provided education to patient/caregiver about Hospice and/or Palliative Care services . LCSW provided education on healthy sleep hygiene and what that looks like. LCSW encouraged patient to implement a night time routine into his schedule that works best for him and that he is able to maintain. Advised patient to implement deep breathing/grounding/meditation/self-care exercises into his nightly routine to combat racing  thoughts at night. LCSW encouraged patient to wake up at the same time each day, make her sleeping environment comfortable, exercise when able, to limit naps and to not eat or drink anything right before bed.  . Other interventions include:Solution-Focused Strategies implemented  . Patient has a neurologist appointment on 11/26/20  Follow Up Plan:  SW will follow up with patient by phone over the next quarter      Follow Up Plan: SW will follow up with patient by phone over the next quarter  Eula Fried, [place], MSW, [email or web address] Phone: [phone]

## 2020-11-23 ENCOUNTER — Other Ambulatory Visit: Payer: Self-pay | Admitting: Family Medicine

## 2020-11-23 DIAGNOSIS — E1169 Type 2 diabetes mellitus with other specified complication: Secondary | ICD-10-CM

## 2020-11-23 DIAGNOSIS — Z794 Long term (current) use of insulin: Secondary | ICD-10-CM

## 2020-11-23 NOTE — Telephone Encounter (Signed)
Requested medication (s) are due for refill today: -  Requested medication (s) are on the active medication list: historical med  Last refill:  06/26/20  Future visit scheduled: no  Notes to clinic:  historical med/provider   Requested Prescriptions  Pending Prescriptions Disp Refills   JARDIANCE 25 MG TABS tablet [Pharmacy Med Name: JARDIANCE 25MG TABLETS] 90 tablet     Sig: TAKE 1 TABLET BY [place]      Endocrinology:  Diabetes - SGLT2 Inhibitors Passed - 11/23/2020 11:32 AM      Passed - Cr in normal range and within 360 days    Creat  Date Value Ref Range Status  06/18/2020 1.08 0.70 - 1.25 mg/dL Final    Comment:    For patients >59 years of age, the reference limit for Creatinine is approximately 13% higher for people identified as African-American. .           Passed - LDL in normal range and within 360 days    LDL Cholesterol (Calc)  Date Value Ref Range Status  06/18/2020 54 mg/dL (calc) Final    Comment:    Reference range: <100 . Desirable range <100 mg/dL for primary prevention;   <70 mg/dL for patients with CHD or diabetic patients  with > or = 2 CHD risk factors. Marland Kitchen LDL-C is now calculated using the Martin-Hopkins  calculation, which is a validated novel method providing  better accuracy than the Friedewald equation in the  estimation of LDL-C.  Cresenciano Genre et al. Annamaria Helling. 4540;981(19): 2061-2068  (http://education.[employer].com/faq/FAQ164)           Passed - HBA1C is between 0 and 7.9 and within 180 days    Hemoglobin A1C  Date Value Ref Range Status  10/10/2020 7.4 (A) 4.0 - 5.6 % Final   Hgb A1c MFr Bld  Date Value Ref Range Status  06/18/2020 7.9 (H) <5.7 % of total Hgb Final    Comment:    For someone without known diabetes, a hemoglobin A1c value of 6.5% or greater indicates that they may have  diabetes and this should be confirmed with a follow-up  test. . For someone with known diabetes, a value <7% indicates  that their  diabetes is well controlled and a value  greater than or equal to 7% indicates suboptimal  control. A1c targets should be individualized based on  duration of diabetes, age, comorbid conditions, and  other considerations. . Currently, no consensus exists regarding use of hemoglobin A1c for diagnosis of diabetes for children. .           Passed - eGFR in normal range and within 360 days    GFR calc non Af Amer  Date Value Ref Range Status  12/19/2019 69  Final          Passed - Valid encounter within last 6 months    Recent Outpatient Visits           1 month ago Type 2 diabetes mellitus with other specified complication, with long-term current use of insulin [facility])   [place], DO   2 months ago Dyspnea on exertion   [place], DO   5 months ago Type 2 diabetes mellitus with other specified complication, with long-term current use of insulin [facility])   [place], DO   6 months ago Type 2 diabetes mellitus with other specified complication, with long-term current use  of insulin [facility])   [place], [place]

## 2020-11-26 DIAGNOSIS — M79602 Pain in left arm: Secondary | ICD-10-CM | POA: Diagnosis not present

## 2020-11-26 DIAGNOSIS — M542 Cervicalgia: Secondary | ICD-10-CM | POA: Diagnosis not present

## 2020-11-26 DIAGNOSIS — R200 Anesthesia of skin: Secondary | ICD-10-CM | POA: Diagnosis not present

## 2020-11-26 DIAGNOSIS — M545 Low back pain, unspecified: Secondary | ICD-10-CM | POA: Diagnosis not present

## 2020-11-27 ENCOUNTER — Other Ambulatory Visit: Payer: Self-pay | Admitting: Neurology

## 2020-11-27 DIAGNOSIS — M545 Low back pain, unspecified: Secondary | ICD-10-CM

## 2020-12-05 ENCOUNTER — Other Ambulatory Visit: Payer: Self-pay

## 2020-12-05 ENCOUNTER — Ambulatory Visit
Admission: RE | Admit: 2020-12-05 | Discharge: 2020-12-05 | Disposition: A | Discharge status: Home / Self Care | Payer: Medicare Other | Source: Ambulatory Visit | Attending: Neurology | Admitting: Neurology

## 2020-12-05 DIAGNOSIS — M545 Low back pain, unspecified: Secondary | ICD-10-CM | POA: Diagnosis not present

## 2020-12-05 DIAGNOSIS — M542 Cervicalgia: Secondary | ICD-10-CM | POA: Diagnosis not present

## 2020-12-09 ENCOUNTER — Ambulatory Visit: Payer: Medicare Other | Admitting: Pharmacist

## 2020-12-09 ENCOUNTER — Other Ambulatory Visit: Payer: Self-pay | Admitting: Family Medicine

## 2020-12-09 DIAGNOSIS — E1169 Type 2 diabetes mellitus with other specified complication: Secondary | ICD-10-CM

## 2020-12-09 DIAGNOSIS — Z794 Long term (current) use of insulin: Secondary | ICD-10-CM

## 2020-12-09 MED ORDER — PIOGLITAZONE HCL 15 MG PO TABS: 15.0000 mg | ORAL_TABLET | Freq: Every day | ORAL | 1 refills | Status: DC

## 2020-12-09 MED ORDER — OZEMPIC (0.25 OR 0.5 MG/DOSE) 2 MG/1.5ML ~~LOC~~ SOPN: 0.5000 mg | PEN_INJECTOR | SUBCUTANEOUS | 2 refills | Status: DC

## 2020-12-09 MED ORDER — CHLORTHALIDONE 25 MG PO TABS
25.0000 mg | ORAL_TABLET | Freq: Every day | ORAL | 1 refills | Status: DC
Start: 2020-12-09 — End: 2021-08-09

## 2020-12-09 MED ORDER — METFORMIN HCL ER 500 MG PO TB24: 1000.0000 mg | ORAL_TABLET | Freq: Two times a day (BID) | ORAL | 1 refills | Status: DC

## 2020-12-09 NOTE — Patient Instructions (Signed)
Thank you allowing the Chronic Care Management Team to be a part of your care! It was a pleasure speaking with you today!     CCM (Chronic Care Management) Team    Noreene Larsson RN, MSN, CCM Nurse Care Coordinator  [phone]   Harlow Asa PharmD  Clinical Pharmacist  [phone]   Eula Fried LCSW Clinical Social Worker [phone]  Visit Information  Patient Care Plan: Disease Management T2DM, osteoarthritis    Problem Identified: Disease Progression     Long-Range Goal: Disease Progression Prevented or Minimized   Start Date: 11/06/2020  Expected End Date: 01/05/2021  Recent Progress: On track  Priority: High  Note:   Current Barriers:  . Unable to independently afford treatment regimen. Reports currently in coverage gap of [employer] Part D plan  Pharmacist Clinical Goal(s):  Marland Kitchen Over the next 60 days, patient will verbalize ability to afford treatment regimen. through collaboration with PharmD and provider.   Interventions: . Inter-disciplinary care team collaboration (see longitudinal plan of care)  Type 2 Diabetes  . Reports taking: ? Ozempic 0.5 mg weekly (on Mondays) ? Toujeo 60 units daily in evening  ? Humalog - Reports taking three times daily with meals following sliding scale from previous Endocrinologist in [place] ? Reports sliding scale based on blood sugar prior to each meal: ? If less than 140: takes 13 units ? If 140-200: takes 14 units ? If 200-250: takes 15 units ? If 250 or above: takes 16 units ? Metformin ER 500 mg - 2 tablets (1000 mg) twice daily ? Pioglitazone 15 mg once daily ? Not currently taking Jardiance as ran out ~10 days ago. Note patient had been taking Jardiance 10 mg daily (previously on 25 mg daily, but taking from previous dose supply due to cost of refilling since in coverage gap) - Reports will refill and restart Jardiance 25 mg daily as prescribed . Reports fasting blood sugars ranging this week:  150s-190s ? Reports readings had been elevated over the holidays due to having more sweets, but reports is focusing on controlling carbohydrate portions again and CBGs have improved ? Denies recent low blood sugars ? Have counseled patient on s/s of low blood sugar and how to treat lows . Encourage patient to continue to monitor home CBGs, to start keeping log of results and to contact office for any low blood sugar readings of readings outside established parameters   Hypertension . Reports taking: ? Chlorthalidone 25 mg daily ? Losartan 50 mg daily . Reports monitors home blood pressure with wrist monitor . Recalls recent readings running 120s-130s/70s-80s . Encourage patient to obtain upper arm blood pressure monitor ? Have counseled on option of using health plan OTC benefit  . Encouraged patient to monitor home blood pressure, keep log of results and bring log with him to medical appointments  Medication Assistance . Continue to collaborate with [facility] CPhT Susy Frizzle for assistance to patient with completing assistance applications with [employer] and [employer] for 2022 calendar year o Patient confirms received applications, will complete and mail back to [place] patient has phone number for [facility] CPhT  Medication Adherence . Note patient uses weekly pillbox to manage his medications . Patient requesting new Rx for chlorthalidone, pioglitazone, metformin and Ozempic as current Rxs out of refills.  o Will follow up with PCP regarding request for renewal  Patient Goals/Self-Care Activities . Over the next 60 days, patient will:  - collaborate with provider on medication  access solutions -patient to call office for new or worsening medical concerns  Follow Up Plan: Telephone follow up appointment with care management team member scheduled for: 01/01/2021 at 10:15 AM     The patient verbalized understanding of instructions, educational materials, and care plan  provided today and declined offer to receive copy of patient instructions, educational materials, and care plan.    Duanne Moron, PharmD, [facility] Clinical Pharmacist [facility] [employer] [phone]

## 2020-12-09 NOTE — Chronic Care Management (AMB) (Signed)
Chronic Care Management   Pharmacy Note  12/09/2020 Name: Eric Andrews MRN: [id number] DOB: [birth year]  Subjective:  Eric Andrews is a 70 y.o. year old male who is a primary care patient of [name], DO. The CCM team was consulted for assistance with chronic disease management and care coordination needs.    Engaged with patient by telephone for follow up visit in response to provider referral for pharmacy case management and/or care coordination services.   Consent to Services:  Patient was given information about Chronic Care Management services, agreed to services, and gave verbal consent prior to initiation of services on 10/11/2020. Please see initial visit note for detailed documentation.   Objective:  Lab Results  Component Value Date   CREATININE 1.08 06/18/2020   CREATININE 1.1 12/19/2019    Lab Results  Component Value Date   HGBA1C 7.4 (A) 10/10/2020       Component Value Date/Time   CHOL 114 06/18/2020 0857   TRIG 181 (H) 06/18/2020 0857   HDL 34 (L) 06/18/2020 0857   CHOLHDL 3.4 06/18/2020 0857   LDLCALC 54 06/18/2020 0857    BP Readings from Last 3 Encounters:  10/10/20 137/80  10/08/20 138/80  09/23/20 122/74    Assessment/Interventions: Review of patient past medical history, allergies, medications, health status, including review of consultants reports, laboratory and other test data, was performed as part of comprehensive evaluation and provision of chronic care management services.   SDOH (Social Determinants of Health) assessments and interventions performed:    CCM Care Plan  Allergies  Allergen Reactions  . Dust Mite Extract     Medications Reviewed Today    Reviewed by Vella Raring, RPH (Pharmacist) on 11/15/20 at 1050  Med List Status: <None>  Medication Order Taking? Sig Documenting Provider Last Dose Status Informant  aspirin 81 MG EC tablet [id number] Yes Take 81 mg by mouth daily. [provider] Taking  Active   atorvastatin (LIPITOR) 20 MG tablet [id number] Yes TAKE 1 TABLET BY MOUTH AT BEDTIME Karamalegos, [name], DO Taking Active   busPIRone (BUSPAR) 5 MG tablet [id number] Yes Take 1 tablet (5 mg total) by mouth 2 (two) times daily as needed (anxiety). [name], DO Taking Active   celecoxib (CELEBREX) 200 MG capsule [id number] Yes Take 200 mg by mouth 2 (two) times daily. [provider] Taking Active   cetirizine (ZYRTEC) 10 MG tablet [id number] Yes Take 10 mg by mouth at bedtime. [provider] Taking Active   chlorthalidone (HYGROTON) 25 MG tablet [id number] Yes Take 25 mg by mouth daily. [provider] Taking Active   cholecalciferol (VITAMIN D3) 25 MCG (1000 UNIT) tablet [id number] Yes Take 1,000 Units by mouth daily. [provider] Taking Active   empagliflozin (JARDIANCE) 25 MG TABS tablet [id number]  Take 25 mg by mouth daily. [provider]  Active   fenofibrate micronized (LOFIBRA) 134 MG capsule [id number] Yes Take by mouth. [provider] Taking Active   finasteride (PROSCAR) 5 MG tablet [id number] Yes Take 1 tablet (5 mg total) by mouth every evening. [name], DO Taking Active   hydrALAZINE (APRESOLINE) 100 MG tablet [id number] No Take by mouth.  Patient not taking: Reported on 11/15/2020   [provider] Not Taking Active   Insulin Glargine (TOUJEO SOLOSTAR [place]) [id number] Yes Inject 60 Units into the skin every evening. [provider] Taking Active   insulin lispro (HUMALOG) 100 UNIT/ML injection [id number] Yes Inject 13 Units into the skin  2 (two) times daily with a meal. [provider] Taking Active   Insulin Pen Needle 31G X 5 MM MISC [id number]  Use with insulin to inject into skin 3 times daily as directed [name], DO  Active   losartan (COZAAR) 50 MG tablet [id number] Yes Take 50 mg by mouth daily. [provider] Taking Active   metFORMIN  (GLUCOPHAGE-XR) 500 MG 24 hr tablet [id number] Yes Take 1,000 mg by mouth 2 (two) times daily with a meal. Karamalegos, [name], DO Taking Active   Omega-3 Fatty Acids (FISH OIL) 1000 MG CAPS [id number] Yes Take by mouth in the morning and at bedtime. [provider] Taking Active   OZEMPIC, 0.25 OR 0.5 MG/DOSE, 2 MG/1.5ML SOPN [id number] Yes Inject 0.375 mLs (0.5 mg total) into the skin once a week. [name], DO Taking Active            Med Note [facility], Aliyana Dlugosz A   Fri Nov 15, 2020 10:34 AM) On Mondays  pioglitazone (ACTOS) 15 MG tablet [id number] Yes Take 15 mg by mouth daily. [provider] Taking Active   propranolol (INDERAL) 20 MG tablet [id number] No Take 20 mg by mouth daily.  Patient not taking: Reported on 11/15/2020   [provider] Not Taking Active   tamsulosin (FLOMAX) 0.4 MG CAPS capsule [id number] Yes Take 0.4 mg by mouth at bedtime. [provider] Taking Active   traZODone (DESYREL) 100 MG tablet [id number] Yes TAKE 1 TABLET BY MOUTH AT BEDTIME Karamalegos, [name], DO Taking Active   venlafaxine XR (EFFEXOR-XR) 150 MG 24 hr capsule [id number]  Take 150 mg by mouth daily with breakfast. [provider]  Active           Patient Active Problem List   Diagnosis Date Noted  . Morbid obesity with BMI of 40.0-44.9, adult ([place]) 06/26/2020  . Major depressive disorder, recurrent, moderate ([place]) 06/25/2020  . GAD (generalized anxiety disorder) 06/25/2020  . Type 2 diabetes mellitus with other specified complication ([place]) [id number]  . Chronic pain of right knee 05/27/2020  . Primary osteoarthritis involving multiple joints 05/27/2020  . Cataract associated with type 2 diabetes mellitus ([place]) 05/27/2020  . History of torn meniscus of left knee 05/27/2020  . DDD (degenerative disc disease), lumbar 05/27/2020    Conditions to be addressed/monitored: HTN and DMII  Patient Care Plan: Disease Management T2DM,  osteoarthritis    Problem Identified: Disease Progression     Long-Range Goal: Disease Progression Prevented or Minimized   Start Date: 11/06/2020  Expected End Date: 01/05/2021  Recent Progress: On track  Priority: High  Note:   Current Barriers:  . Unable to independently afford treatment regimen. Reports currently in coverage gap of [employer] Part D plan  Pharmacist Clinical Goal(s):  Marland Kitchen Over the next 60 days, patient will verbalize ability to afford treatment regimen. through collaboration with PharmD and provider.   Interventions: . Inter-disciplinary care team collaboration (see longitudinal plan of care)  Type 2 Diabetes  . Reports taking: ? Ozempic 0.5 mg weekly (on Mondays) ? Toujeo 60 units daily in evening  ? Humalog - Reports taking three times daily with meals following sliding scale from previous Endocrinologist in [place] ? Reports sliding scale based on blood sugar prior to each meal: ? If less than 140: takes 13 units ? If 140-200: takes 14 units ? If 200-250: takes 15 units ? If 250 or above: takes 16 units ? Metformin ER 500 mg -  2 tablets (1000 mg) twice daily ? Pioglitazone 15 mg once daily ? Not currently taking Jardiance as ran out ~10 days ago. Note patient had been taking Jardiance 10 mg daily (previously on 25 mg daily, but taking from previous dose supply due to cost of refilling since in coverage gap) - Reports will refill and restart Jardiance 25 mg daily as prescribed . Reports fasting blood sugars ranging this week: 150s-190s ? Reports readings had been elevated over the holidays due to having more sweets, but reports is focusing on controlling carbohydrate portions again and CBGs have improved ? Denies recent low blood sugars ? Have counseled patient on s/s of low blood sugar and how to treat lows . Encourage patient to continue to monitor home CBGs, to start keeping log of results and to contact office for any low blood sugar  readings of readings outside established parameters   Hypertension . Reports taking: ? Chlorthalidone 25 mg daily ? Losartan 50 mg daily . Reports monitors home blood pressure with wrist monitor . Recalls recent readings running 120s-130s/70s-80s . Encourage patient to obtain upper arm blood pressure monitor ? Have counseled on option of using health plan OTC benefit  . Encouraged patient to monitor home blood pressure, keep log of results and bring log with him to medical appointments  Medication Assistance . Continue to collaborate with [facility] CPhT Pattricia Boss for assistance to patient with completing assistance applications with [employer] and [employer] for 2022 calendar year o Patient confirms received applications, will complete and mail back to [facility] CPhT o Confirm patient has phone number for [facility] CPhT  Medication Adherence . Note patient uses weekly pillbox to manage his medications . Patient requesting new Rx for chlorthalidone, pioglitazone, metformin and Ozempic as current Rxs out of refills.  o Will follow up with PCP regarding request for renewal  Patient Goals/Self-Care Activities . Over the next 60 days, patient will:  - collaborate with provider on medication access solutions -patient to call office for new or worsening medical concerns  Follow Up Plan: Telephone follow up appointment with care management team member scheduled for: 01/01/2021 at 10:15 AM     Duanne Moron, PharmD, [facility] Clinical Pharmacist [facility] [phone]

## 2020-12-11 ENCOUNTER — Other Ambulatory Visit: Payer: Self-pay | Admitting: Family Medicine

## 2020-12-11 ENCOUNTER — Encounter: Payer: Self-pay | Admitting: Emergency Medicine

## 2020-12-11 ENCOUNTER — Other Ambulatory Visit: Payer: Self-pay

## 2020-12-11 ENCOUNTER — Ambulatory Visit
Admission: EM | Admit: 2020-12-11 | Discharge: 2020-12-11 | Disposition: A | Discharge status: Home / Self Care | Payer: Medicare Other | Attending: Emergency Medicine | Admitting: Emergency Medicine

## 2020-12-11 DIAGNOSIS — K047 Periapical abscess without sinus: Secondary | ICD-10-CM | POA: Diagnosis not present

## 2020-12-11 DIAGNOSIS — Z20822 Contact with and (suspected) exposure to covid-19: Secondary | ICD-10-CM | POA: Diagnosis not present

## 2020-12-11 MED ORDER — AMOXICILLIN-POT CLAVULANATE 875-125 MG PO TABS: 1.0000 | ORAL_TABLET | Freq: Two times a day (BID) | ORAL | 0 refills | Status: AC

## 2020-12-11 NOTE — ED Triage Notes (Signed)
Patient c/o possible thrush in his mouth x 1 month

## 2020-12-11 NOTE — ED Provider Notes (Signed)
[facility]    CSN: [id number] Arrival date & time: 12/11/20  1428      History   Chief Complaint Chief Complaint  Patient presents with  . Oral Pain    [phone]     HPI Eric Andrews is a 70 y.o. male.   HPI   70 year old male here for evaluation of dental pain for the last month and a half.  Patient reports that he has been dealing with a sore on the left side of his tongue, bad taste in his mouth, pain at the tip of his tongue, and blood and puslike drainage from his gum.  Patient denies fever.  Additionally, patient's granddaughter just tested positive for Covid and patient is requesting testing.  Past Medical History:  Diagnosis Date  . ADD (attention deficit disorder)   . Anxiety   . Depression   . Enlarged prostate   . Knee pain   . Sleep apnea     Patient Active Problem List   Diagnosis Date Noted  . Morbid obesity with BMI of 40.0-44.9, adult ([place]) 06/26/2020  . Major depressive disorder, recurrent, moderate ([place]) 06/25/2020  . GAD (generalized anxiety disorder) 06/25/2020  . Type 2 diabetes mellitus with other specified complication ([place]) [id number]  . Chronic pain of right knee 05/27/2020  . Primary osteoarthritis involving multiple joints 05/27/2020  . Cataract associated with type 2 diabetes mellitus ([place]) 05/27/2020  . History of torn meniscus of left knee 05/27/2020  . DDD (degenerative disc disease), lumbar 05/27/2020    Past Surgical History:  Procedure Laterality Date  . APPENDECTOMY    . HERNIA REPAIR     Left  . KNEE ARTHROSCOPY     Left       Home Medications    Prior to Admission medications   Medication Sig Start Date End Date Taking? Authorizing Provider  amoxicillin-clavulanate (AUGMENTIN) 875-125 MG tablet Take 1 tablet by mouth every 12 (twelve) hours for 10 days. 12/11/20 12/21/20 Yes Margarette Canada, NP  aspirin 81 MG EC tablet Take 81 mg by mouth daily.   Yes [provider]  atorvastatin (LIPITOR) 20  MG tablet TAKE 1 TABLET BY MOUTH AT BEDTIME 08/01/20  Yes Karamalegos, [name], DO  busPIRone (BUSPAR) 5 MG tablet Take 1 tablet (5 mg total) by mouth 2 (two) times daily as needed (anxiety). 09/02/20  Yes Karamalegos, [name], DO  celecoxib (CELEBREX) 200 MG capsule Take 200 mg by mouth 2 (two) times daily.   Yes [provider]  cetirizine (ZYRTEC) 10 MG tablet Take 10 mg by mouth at bedtime.   Yes [provider]  chlorthalidone (HYGROTON) 25 MG tablet Take 1 tablet (25 mg total) by mouth daily. 12/09/20  Yes Karamalegos, [name], DO  cholecalciferol (VITAMIN D3) 25 MCG (1000 UNIT) tablet Take 1,000 Units by mouth daily.   Yes [provider]  fenofibrate micronized (LOFIBRA) 134 MG capsule Take by mouth.   Yes [provider]  finasteride (PROSCAR) 5 MG tablet Take 1 tablet (5 mg total) by mouth every evening. 07/01/20  Yes Karamalegos, [name], DO  Insulin Glargine (TOUJEO SOLOSTAR [place]) Inject 50 Units into the skin every evening.   Yes [provider]  insulin lispro (HUMALOG) 100 UNIT/ML injection Inject 13 Units into the skin 2 (two) times daily with a meal.   Yes [provider]  Insulin Pen Needle 31G X 5 MM MISC Use with insulin to inject into skin 3 times daily as directed 10/10/20  Yes Smitty Cords, DO  JARDIANCE 25 MG TABS tablet TAKE 1 TABLET BY MOUTH EVERY DAY 11/25/20  Yes Karamalegos, Netta Neat, DO  losartan (COZAAR) 50 MG tablet Take 50 mg by mouth daily.   Yes [provider]  metFORMIN (GLUCOPHAGE-XR) 500 MG 24 hr tablet Take 2 tablets (1,000 mg total) by mouth 2 (two) times daily with a meal. 12/09/20  Yes Karamalegos, Netta Neat, DO  Multiple Vitamin (MULTIVITAMIN) capsule Take 1 capsule by mouth daily.   Yes [provider]  Omega-3 Fatty Acids (FISH OIL) 1000 MG CAPS Take by mouth in the morning and at bedtime.   Yes [provider]  OZEMPIC, 0.25 OR 0.5 MG/DOSE, 2 MG/1.5ML  SOPN Inject 0.5 mg into the skin once a week. 12/09/20  Yes Karamalegos, Netta Neat, DO  pioglitazone (ACTOS) 15 MG tablet Take 1 tablet (15 mg total) by mouth daily. 12/09/20  Yes Karamalegos, Netta Neat, DO  tamsulosin (FLOMAX) 0.4 MG CAPS capsule Take 0.4 mg by mouth at bedtime.   Yes [provider]  traZODone (DESYREL) 100 MG tablet TAKE 1 TABLET BY MOUTH AT BEDTIME 08/01/20  Yes Karamalegos, Netta Neat, DO  venlafaxine XR (EFFEXOR-XR) 150 MG 24 hr capsule Take 150 mg by mouth daily with breakfast.   Yes [provider]    Family History Family History  Problem Relation Age of Onset  . Heart attack Mother 79  . Lung cancer Father   . Alzheimer's disease Brother   . Heart attack Maternal Grandfather 76    Social History Social History   Tobacco Use  . Smoking status: Never Smoker  . Smokeless tobacco: Never Used  Vaping Use  . Vaping Use: Never used  Substance Use Topics  . Alcohol use: Not Currently  . Drug use: Never     Allergies   Dust mite extract   Review of Systems Review of Systems  Constitutional: Negative for activity change, appetite change and fever.  HENT: Positive for dental problem and mouth sores. Negative for sore throat.   Respiratory: Negative for cough and shortness of breath.   Gastrointestinal: Negative for nausea.  Skin: Negative for color change and rash.  Hematological: Negative.   Psychiatric/Behavioral: Negative.      Physical Exam Triage Vital Signs ED Triage Vitals  Enc Vitals Group     BP 12/11/20 1813 (!) 151/87     Pulse Rate 12/11/20 1813 76     Resp 12/11/20 1813 18     Temp 12/11/20 1813 98.4 F (36.9 C)     Temp Source 12/11/20 1813 Oral     SpO2 12/11/20 1813 100 %     Weight 12/11/20 1657 (!) 306 lb 10.6 oz (139.1 kg)     Height 12/11/20 1657 5\' 11"  (1.803 m)     Head Circumference --      Peak Flow --      Pain Score 12/11/20 1657 0     Pain Loc --      Pain Edu? --      Excl. in GC? --    No  data found.  Updated Vital Signs BP (!) 151/87 (BP Location: Right Arm)   Pulse 76   Temp 98.4 F (36.9 C) (Oral)   Resp 18   Ht 5\' 11"  (1.803 m)   Wt (!) 306 lb 10.6 oz (139.1 kg)   SpO2 100%   BMI 42.77 kg/m   Visual Acuity Right Eye Distance:   Left Eye Distance:  Bilateral Distance:    Right Eye Near:   Left Eye Near:    Bilateral Near:     Physical Exam Vitals and nursing note reviewed.  Constitutional:      General: He is not in acute distress.    Appearance: Normal appearance. He is obese. He is not toxic-appearing.  HENT:     Head: Normocephalic and atraumatic.     Mouth/Throat:     Mouth: Mucous membranes are moist.     Pharynx: Posterior oropharyngeal erythema present. No oropharyngeal exudate.  Cardiovascular:     Rate and Rhythm: Normal rate and regular rhythm.     Pulses: Normal pulses.     Heart sounds: Normal heart sounds. No murmur heard. No gallop.   Pulmonary:     Effort: Pulmonary effort is normal.     Breath sounds: Normal breath sounds. No wheezing or rales.  Musculoskeletal:     Cervical back: Normal range of motion and neck supple.  Lymphadenopathy:     Cervical: No cervical adenopathy.  Skin:    General: Skin is warm and dry.     Capillary Refill: Capillary refill takes less than 2 seconds.     Findings: No erythema or rash.  Neurological:     General: No focal deficit present.     Mental Status: He is alert and oriented to person, place, and time.  Psychiatric:        Mood and Affect: Mood normal.        Behavior: Behavior normal.        Thought Content: Thought content normal.        Judgment: Judgment normal.      UC Treatments / Results  Labs (all labs ordered are listed, but only abnormal results are displayed) Labs Reviewed  SARS CORONAVIRUS 2 (TAT 6-24 HRS)    EKG   Radiology No results found.  Procedures Procedures (including critical care time)  Medications Ordered in UC Medications - No data to  display  Initial Impression / Assessment and Plan / UC Course  I have reviewed the triage vital signs and the nursing notes.  Pertinent labs & imaging results that were available during my care of the patient were reviewed by me and considered in my medical decision making (see chart for details).   Patient is here for evaluation of oral issues.  Physical exam reveals a hole in the gum tissue at the rear of the left side of the jaw behind the last molar.  There is some serosanguineous drainage coming from the gum tissue when pressed with the tongue blade.  The last molar is tender to percussion with the tongue blade.  The first molar on the left lower side has a hole in it but it is not tender to percussion.  Furthermore there is a sore on the left side of the tongue near the rear.  That sore is erythematous but it is not draining and is not edematous.  There is no fluctuance when palpating along the jawline externally.  No lymphadenopathy appreciated on exam.   Final Clinical Impressions(s) / UC Diagnoses   Final diagnoses:  Dental abscess     Discharge Instructions     Take the Augmentin twice daily with food for 10 days.  Rinse with warm salt water after meals and at bedtime.  Keep your follow-up appointment with your dentist as scheduled.    ED Prescriptions    Medication Sig Dispense Auth. Provider   amoxicillin-clavulanate (AUGMENTIN) 875-125  MG tablet Take 1 tablet by mouth every 12 (twelve) hours for 10 days. 20 tablet Margarette Canada, NP     PDMP not reviewed this encounter.   Margarette Canada, NP 12/11/20 Bosie Helper

## 2020-12-11 NOTE — Discharge Instructions (Signed)
Take the Augmentin twice daily with food for 10 days.  Rinse with warm salt water after meals and at bedtime.  Keep your follow-up appointment with your dentist as scheduled.

## 2020-12-12 LAB — SARS CORONAVIRUS 2 (TAT 6-24 HRS): SARS Coronavirus 2: NEGATIVE

## 2020-12-13 ENCOUNTER — Other Ambulatory Visit: Payer: Self-pay | Admitting: Family Medicine

## 2020-12-13 DIAGNOSIS — F411 Generalized anxiety disorder: Secondary | ICD-10-CM

## 2020-12-19 ENCOUNTER — Ambulatory Visit: Payer: Medicare Other | Admitting: Licensed Clinical Social Worker

## 2020-12-19 DIAGNOSIS — F411 Generalized anxiety disorder: Secondary | ICD-10-CM

## 2020-12-19 DIAGNOSIS — M8949 Other hypertrophic osteoarthropathy, multiple sites: Secondary | ICD-10-CM

## 2020-12-19 DIAGNOSIS — M159 Polyosteoarthritis, unspecified: Secondary | ICD-10-CM

## 2020-12-19 DIAGNOSIS — F331 Major depressive disorder, recurrent, moderate: Secondary | ICD-10-CM

## 2020-12-19 DIAGNOSIS — E1169 Type 2 diabetes mellitus with other specified complication: Secondary | ICD-10-CM

## 2020-12-19 DIAGNOSIS — Z794 Long term (current) use of insulin: Secondary | ICD-10-CM

## 2020-12-19 NOTE — Chronic Care Management (AMB) (Signed)
Chronic Care Management    Clinical Social Work Note  12/19/2020 Name: Aqeel Norgaard MRN: [id number] DOB: 1951-06-02  Wes Lezotte is a 70 y.o. year old male who is a primary care patient of [name], DO. The CCM team was consulted to assist the patient with chronic disease management and/or care coordination needs related to: Mental Health Counseling and Resources.   Engaged with patient by telephone for follow up visit in response to provider referral for social work chronic care management and care coordination services.   Consent to Services:  The patient was given the following information about Chronic Care Management services today, agreed to services, and gave verbal consent: 1. CCM service includes personalized support from designated clinical staff supervised by the primary care provider, including individualized plan of care and coordination with other care providers 2. 24/7 contact phone numbers for assistance for urgent and routine care needs. 3. Service will only be billed when office clinical staff spend 20 minutes or more in a month to coordinate care. 4. Only one practitioner may furnish and bill the service in a calendar month. 5.The patient may stop CCM services at any time (effective at the end of the month) by phone call to the office staff. 6. The patient will be responsible for cost sharing (co-pay) of up to 20% of the service fee (after annual deductible is met). Patient agreed to services and consent obtained.  Patient agreed to services and consent obtained.   Assessment: Review of patient past medical history, allergies, medications, and health status, including review of relevant consultants reports was performed today as part of a comprehensive evaluation and provision of chronic care management and care coordination services.     SDOH (Social Determinants of Health) assessments and interventions performed:    Advanced Directives Status: See Care Plan for  related entries.  CCM Care Plan  Allergies  Allergen Reactions  . Dust Mite Extract     Outpatient Encounter Medications as of 12/19/2020  Medication Sig Note  . amoxicillin-clavulanate (AUGMENTIN) 875-125 MG tablet Take 1 tablet by mouth every 12 (twelve) hours for 10 days.   Marland Kitchen aspirin 81 MG EC tablet Take 81 mg by mouth daily.   Marland Kitchen atorvastatin (LIPITOR) 20 MG tablet TAKE 1 TABLET BY MOUTH AT BEDTIME   . busPIRone (BUSPAR) 5 MG tablet TAKE 1 TABLET(5 MG) BY MOUTH TWICE DAILY AS NEEDED FOR ANXIETY   . celecoxib (CELEBREX) 200 MG capsule Take 200 mg by mouth 2 (two) times daily.   . cetirizine (ZYRTEC) 10 MG tablet Take 10 mg by mouth at bedtime.   . chlorthalidone (HYGROTON) 25 MG tablet Take 1 tablet (25 mg total) by mouth daily.   . cholecalciferol (VITAMIN D3) 25 MCG (1000 UNIT) tablet Take 1,000 Units by mouth daily.   . fenofibrate micronized (LOFIBRA) 134 MG capsule Take by mouth.   . finasteride (PROSCAR) 5 MG tablet Take 1 tablet (5 mg total) by mouth every evening.   . Insulin Glargine (TOUJEO SOLOSTAR [place]) Inject 50 Units into the skin every evening. 11/15/2020: Reports taking 60 units daily in evening  . insulin lispro (HUMALOG) 100 UNIT/ML injection Inject 13 Units into the skin 2 (two) times daily with a meal. 11/15/2020: Reports taking according to sliding scale from previous Endocrinologist in [place]  . Insulin Pen Needle 31G X 5 MM MISC Use with insulin to inject into skin 3 times daily as directed   . JARDIANCE 25 MG TABS tablet TAKE 1 TABLET BY  MOUTH EVERY DAY   . losartan (COZAAR) 50 MG tablet Take 50 mg by mouth daily.   . metFORMIN (GLUCOPHAGE-XR) 500 MG 24 hr tablet Take 2 tablets (1,000 mg total) by mouth 2 (two) times daily with a meal.   . Multiple Vitamin (MULTIVITAMIN) capsule Take 1 capsule by mouth daily.   . Omega-3 Fatty Acids (FISH OIL) 1000 MG CAPS Take by mouth in the morning and at bedtime.   Marland Kitchen OZEMPIC, 0.25 OR 0.5 MG/DOSE, 2 MG/1.5ML SOPN Inject 0.5 mg  into the skin once a week.   . pioglitazone (ACTOS) 15 MG tablet Take 1 tablet (15 mg total) by mouth daily.   . tamsulosin (FLOMAX) 0.4 MG CAPS capsule Take 0.4 mg by mouth at bedtime.   . traZODone (DESYREL) 100 MG tablet TAKE 1 TABLET BY MOUTH AT BEDTIME   . venlafaxine XR (EFFEXOR-XR) 150 MG 24 hr capsule Take 150 mg by mouth daily with breakfast.    No facility-administered encounter medications on file as of 12/19/2020.    Patient Active Problem List   Diagnosis Date Noted  . Morbid obesity with BMI of 40.0-44.9, adult ([place]) 06/26/2020  . Major depressive disorder, recurrent, moderate ([place]) 06/25/2020  . GAD (generalized anxiety disorder) 06/25/2020  . Type 2 diabetes mellitus with other specified complication ([place]) 73/22/0254  . Chronic pain of right knee 05/27/2020  . Primary osteoarthritis involving multiple joints 05/27/2020  . Cataract associated with type 2 diabetes mellitus ([place]) 05/27/2020  . History of torn meniscus of left knee 05/27/2020  . DDD (degenerative disc disease), lumbar 05/27/2020    Conditions to be addressed/monitored: Depression; Limited social support, Mental Health Concerns  and Social Isolation  Care Plan : General Social Work (Adult)  Updates made by Greg Cutter, LCSW since 12/19/2020 12:00 AM    Problem: Coping Skills (General Plan of Care)     Goal: Coping Skills Enhanced   Note:   Evidence-based guidance:   Acknowledge, normalize and validate difficulty of making life-long lifestyle changes.   Identify current effective and ineffective coping strategies.   Encourage patient and caregiver participation in care to increase self-esteem, confidence and feelings of control.   Consider alternative and complementary therapy approaches such as meditation, mindfulness or yoga.   Encourage participation in cognitive behavioral therapy to foster a positive identity, increase self-awareness, as well as bolster self-esteem, confidence and  self-efficacy.   Discuss spirituality; be present as concerns are identified; encourage journaling, prayer, worship services, meditation or pastoral counseling.   Encourage participation in pleasurable group activities such as hobbies, singing, sports or volunteering).   Encourage the use of mindfulness; refer for training or intensive intervention.   Consider the use of meditative movement therapy such as tai chi, yoga or qigong.   Promote a regular daily exercise program based on tolerance, ability and patient choice to support positive thinking about disease or aging.   Notes:   Timeframe:  Long-Range Goal Priority:  Medium Start Date:  11/19/20                        Expected End Date: 02/18/20                      Follow Up Date- 90 days from 11/19/20   - begin personal counseling - call and visit an old friend - check out volunteer opportunities - join a support group - laugh; watch a funny movie or comedian - learn  and use visualization or guided imagery - perform a random act of kindness - practice relaxation or meditation daily - start or continue a personal journal - talk about feelings with a friend, family or spiritual advisor - practice positive thinking and self-talk    Why is this important?    When you are stressed, down or upset, your body reacts too.   For example, your blood pressure may get higher; you may have a headache or stomachache.   When your emotions get the best of you, your body's ability to fight off cold and flu gets weak.   These steps will help you manage your emotions.    Objective:  Depression screen [facility] 2/9 10/10/2020 09/02/2020 07/09/2020  Decreased Interest 1 3 0  Down, Depressed, Hopeless _0 PHQ - 2 Score _1 Altered sleeping 2 3 0  Tired, decreased energy _2 Change in appetite _3 Feeling bad or failure about yourself  _4 Trouble concentrating _5 Moving slowly or fidgety/restless 2 2 0  Suicidal thoughts 1  1 0  PHQ-9 Score _6 Difficult doing work/chores Somewhat difficult Somewhat difficult Somewhat difficult    GAD 7 : Generalized Anxiety Score 10/10/2020 09/02/2020 06/25/2020  Nervous, Anxious, on Edge _7 Control/stop worrying _8 Worry too much - different things _9 Trouble relaxing _10 Restless _11 Easily annoyed or irritable _12 Afraid - awful might happen _13 Total GAD 7 Score _14 Anxiety Difficulty Somewhat difficult Very difficult Somewhat difficult   Assessment, progress and current barriers:  Patient is currently experiencing symptoms of  anxiety and depression which seems to be exacerbated by his chronic pain.   Lacks knowledge of where and how to connect for mental health support. LCSW will provide Support, Education, and Care Coordination in order to meet unmet need.   Clinical Goal(s): Over the next 120 days, patient will work with SW to reduce or manage symptoms of anxiety, compulsions, depression, insomnia, mood instability, and stress until connected for ongoing counseling.   Clinical Interventions:  . Assessed thoughts of SI, plan and access to means. Assessed patient's previous treatment, needs, coping skills, current treatment, support system and barriers to care . Patient interviewed and appropriate assessments performed: brief mental health assessment . Provided basic mental health support, education and interventions  . Discussed several options for long term counseling based on need and insurance. Assisted patient with narrowing the options down to [facility].  . Reviewed mental health medications with patient prescribed by PCP and discussed compliance  . Referral placed to [facility] on 11/19/20. However, when patient spoke with agency, he did not feel welcome there. He also did not like having to answer certain questions that they asked during the enrollment process. Patient was encouraged to consider a different agency but says that he does not  feel ready for treatment yet. He states that he no longer is interested in pursing mental health resource implementation at this time. CCM LCSW re-sent email with list of mental health providers on 12/19/20 for him to revert back in the case that he changes his mind.  . Assisted patient/caregiver with obtaining information about health plan benefits . Provided education and assistance to client regarding Advanced Directives. . Provided education to patient/caregiver about Hospice and/or Palliative Care services .  LCSW provided education on healthy sleep hygiene and what that looks like. LCSW encouraged patient to implement a night time routine into his schedule that works best for him and that he is able to maintain. Advised patient to implement deep breathing/grounding/meditation/self-care exercises into his nightly routine to combat racing thoughts at night. LCSW encouraged patient to wake up at the same time each day, make her sleeping environment comfortable, exercise when able, to limit naps and to not eat or drink anything right before bed.  . Other interventions include:Solution-Focused Strategies implemented   Follow Up Plan:  Patient will contact CCM LCSW directly if he wishes to consider mental health support resource implementation. CCM LCSW will close referral at this time. Education and resources sent out again on 12/19/20.    Task: Support Psychosocial Response to Risk or Actual Health Condition   Note:   Care Management Activities:    - active listening utilized - counseling provided - current coping strategies identified - decision-making supported - healthy lifestyle promoted - journaling promoted - meditative movement therapy encouraged - mindfulness encouraged - participation in counseling encouraged - problem-solving facilitated - relaxation techniques promoted - self-reflection promoted - spiritual activities promoted - verbalization of feelings encouraged    Notes:       Eula Fried, [place], MSW, [email or web address] Phone: [phone]

## 2020-12-24 ENCOUNTER — Other Ambulatory Visit: Payer: Self-pay | Admitting: Family Medicine

## 2020-12-24 DIAGNOSIS — N401 Enlarged prostate with lower urinary tract symptoms: Secondary | ICD-10-CM

## 2020-12-24 NOTE — Telephone Encounter (Signed)
Requested medication (s) are due for refill today: unsure  Requested medication (s) are on the active medication list: yes  Last refill:  [facility]  Future visit scheduled: no  Notes to clinic:  [facility]    Requested Prescriptions  Pending Prescriptions Disp Refills   tamsulosin (FLOMAX) 0.4 MG CAPS capsule [Pharmacy Med Name: TAMSULOSIN 0.4MG  CAPSULES] 90 capsule     Sig: TAKE ONE CAPSULE BY MOUTH EVERY NIGHT AT BEDTIME FOR URINE RETENTION      Urology: Alpha-Adrenergic Blocker Failed - 12/24/2020  3:00 PM      Failed - Last BP in normal range    BP Readings from Last 1 Encounters:  12/11/20 (!) 151/87          Passed - Valid encounter within last 12 months    Recent Outpatient Visits           2 months ago Type 2 diabetes mellitus with other specified complication, with long-term current use of insulin ([place])   [place], DO   3 months ago Dyspnea on exertion   [facility] [place], [name], DO   6 months ago Type 2 diabetes mellitus with other specified complication, with long-term current use of insulin [facility])   [place], DO   7 months ago Type 2 diabetes mellitus with other specified complication, with long-term current use of insulin [facility])   [place], [name], DO

## 2020-12-29 ENCOUNTER — Other Ambulatory Visit: Payer: Self-pay

## 2020-12-29 ENCOUNTER — Ambulatory Visit
Admission: RE | Admit: 2020-12-29 | Discharge: 2020-12-29 | Disposition: A | Discharge status: Home / Self Care | Payer: Medicare Other | Source: Ambulatory Visit

## 2020-12-29 VITALS — BP 150/84 | HR 96 | Temp 98.2°F | Resp 18 | Ht 71.0 in | Wt 307.0 lb

## 2020-12-29 DIAGNOSIS — K029 Dental caries, unspecified: Secondary | ICD-10-CM

## 2020-12-29 DIAGNOSIS — B370 Candidal stomatitis: Secondary | ICD-10-CM | POA: Diagnosis not present

## 2020-12-29 MED ORDER — NYSTATIN 100000 UNIT/ML MT SUSP: OROMUCOSAL | 0 refills | Status: DC

## 2020-12-29 NOTE — ED Provider Notes (Signed)
[facility]    CSN: [id number] Arrival date & time: 12/29/20  1251      History   Chief Complaint Chief Complaint  Patient presents with  . Appointment  . Dental Pain  . Mouth Lesions    HPI Eric Andrews is a 70 y.o. male presenting for white coating on tongue and bad taste in mouth x 2 months.  Patient states that he was seen about 3 weeks ago at [facility] urgent care and treated for dental abscess with Augmentin.  He states that he completed the antibiotics which relieved the dental abscess.  He states that he has had continued other symptoms.  He denies any painful swallowing but states that the tip of his tongue is painful.  He does have an appointment with his dentist in 4 days.  Patient states that he has had a yeast infection in his throat in the past and he believes that he has another 1.  He is a type II diabetic and is on insulin.  He states that his last A1c was 7.6.  He denies using any corticosteroid inhalers.  No fevers.  He has no other concerns.  HPI  Past Medical History:  Diagnosis Date  . ADD (attention deficit disorder)   . Anxiety   . Depression   . Diabetes mellitus without complication ([place])   . Enlarged prostate   . Knee pain   . Sleep apnea     Patient Active Problem List   Diagnosis Date Noted  . Morbid obesity with BMI of 40.0-44.9, adult ([place]) 06/26/2020  . Major depressive disorder, recurrent, moderate ([place]) 06/25/2020  . GAD (generalized anxiety disorder) 06/25/2020  . Type 2 diabetes mellitus with other specified complication ([place]) 99991111  . Chronic pain of right knee 05/27/2020  . Primary osteoarthritis involving multiple joints 05/27/2020  . Cataract associated with type 2 diabetes mellitus ([place]) 05/27/2020  . History of torn meniscus of left knee 05/27/2020  . DDD (degenerative disc disease), lumbar 05/27/2020    Past Surgical History:  Procedure Laterality Date  . APPENDECTOMY    . HERNIA REPAIR     Left  . KNEE  ARTHROSCOPY     Left       Home Medications    Prior to Admission medications   Medication Sig Start Date End Date Taking? Authorizing Provider  aspirin 81 MG EC tablet Take 81 mg by mouth daily.   Yes [provider]  atorvastatin (LIPITOR) 20 MG tablet TAKE 1 TABLET BY MOUTH AT BEDTIME 08/01/20  Yes Karamalegos, Alexander J, DO  busPIRone (BUSPAR) 5 MG tablet TAKE 1 TABLET(5 MG) BY MOUTH TWICE DAILY AS NEEDED FOR ANXIETY 12/14/20  Yes Karamalegos, [name], DO  celecoxib (CELEBREX) 200 MG capsule Take 200 mg by mouth 2 (two) times daily.   Yes [provider]  cetirizine (ZYRTEC) 10 MG tablet Take 10 mg by mouth at bedtime.   Yes [provider]  chlorthalidone (HYGROTON) 25 MG tablet Take 1 tablet (25 mg total) by mouth daily. 12/09/20  Yes Karamalegos, [name], DO  cholecalciferol (VITAMIN D3) 25 MCG (1000 UNIT) tablet Take 1,000 Units by mouth daily.   Yes [provider]  fenofibrate micronized (LOFIBRA) 134 MG capsule Take by mouth.   Yes [provider]  finasteride (PROSCAR) 5 MG tablet Take 1 tablet (5 mg total) by mouth every evening. 07/01/20  Yes Karamalegos, [name], DO  gabapentin (NEURONTIN) 100 MG capsule Take 2 capsules by mouth 2 (  two) times daily. 12/27/20  Yes [provider]  Insulin Glargine (TOUJEO SOLOSTAR [place]) Inject 50 Units into the skin every evening.   Yes [provider]  insulin lispro (HUMALOG) 100 UNIT/ML injection Inject 13 Units into the skin 2 (two) times daily with a meal.   Yes [provider]  Insulin Pen Needle 31G X 5 MM MISC Use with insulin to inject into skin 3 times daily as directed 10/10/20  Yes Karamalegos, Alexander J, DO  losartan (COZAAR) 50 MG tablet Take 50 mg by mouth daily.   Yes [provider]  metFORMIN (GLUCOPHAGE-XR) 500 MG 24 hr tablet Take 2 tablets (1,000 mg total) by mouth 2 (two) times daily with a meal. 12/09/20  Yes Karamalegos, [name], DO   Multiple Vitamin (MULTIVITAMIN) capsule Take 1 capsule by mouth daily.   Yes [provider]  nystatin (MYCOSTATIN) 100000 UNIT/ML suspension Swish and retain 5 ml in mouth as long as possible QID x 7-14 days 12/29/20  Yes Danton Clap, PA-C  Omega-3 Fatty Acids (FISH OIL) 1000 MG CAPS Take by mouth in the morning and at bedtime.   Yes [provider]  OZEMPIC, 0.25 OR 0.5 MG/DOSE, 2 MG/1.5ML SOPN Inject 0.5 mg into the skin once a week. 12/09/20  Yes Karamalegos, [name], DO  pioglitazone (ACTOS) 15 MG tablet Take 1 tablet (15 mg total) by mouth daily. 12/09/20  Yes Karamalegos, [name], DO  tamsulosin (FLOMAX) 0.4 MG CAPS capsule TAKE ONE CAPSULE BY MOUTH EVERY NIGHT AT BEDTIME FOR URINE RETENTION 12/24/20  Yes Karamalegos, [name], DO  traZODone (DESYREL) 100 MG tablet TAKE 1 TABLET BY MOUTH AT BEDTIME 08/01/20  Yes Karamalegos, Alexander J, DO  venlafaxine XR (EFFEXOR-XR) 150 MG 24 hr capsule Take 150 mg by mouth daily with breakfast.   Yes [provider]  JARDIANCE 25 MG TABS tablet TAKE 1 TABLET BY MOUTH EVERY DAY 11/25/20   [name], DO    Family History Family History  Problem Relation Age of Onset  . Heart attack Mother 47  . Lung cancer Father   . Alzheimer's disease Brother   . Heart attack Maternal Grandfather 80    Social History Social History   Tobacco Use  . Smoking status: Never Smoker  . Smokeless tobacco: Never Used  Vaping Use  . Vaping Use: Never used  Substance Use Topics  . Alcohol use: Not Currently  . Drug use: Never     Allergies   Dust mite extract   Review of Systems Review of Systems  Constitutional: Negative for fatigue and fever.  HENT: Positive for dental problem. Negative for mouth sores, sore throat and trouble swallowing.        Tongue pain  Neurological: Negative for weakness.  Hematological: Negative for adenopathy.     Physical Exam Triage Vital Signs ED Triage Vitals  Enc  Vitals Group     BP 12/29/20 1352 (!) 150/84     Pulse Rate 12/29/20 1352 96     Resp 12/29/20 1352 18     Temp 12/29/20 1352 98.2 F (36.8 C)     Temp Source 12/29/20 1352 Oral     SpO2 12/29/20 1352 96 %     Weight 12/29/20 1353 (!) 307 lb (139.3 kg)     Height 12/29/20 1353 5\' 11"  (1.803 m)     Head Circumference --      Peak Flow --      Pain Score 12/29/20 1353  1     Pain Loc --      Pain Edu? --      Excl. in GC? --    No data found.  Updated Vital Signs BP (!) 150/84 (BP Location: Left Arm)   Pulse 96   Temp 98.2 F (36.8 C) (Oral)   Resp 18   Ht 5\' 11"  (1.803 m)   Wt (!) 307 lb (139.3 kg)   SpO2 96%   BMI 42.82 kg/m    Physical Exam Vitals and nursing note reviewed.  Constitutional:      General: He is not in acute distress.    Appearance: Normal appearance. He is well-developed and well-nourished. He is obese. He is not ill-appearing.  HENT:     Head: Normocephalic and atraumatic.     Mouth/Throat:     Mouth: Mucous membranes are moist.     Dentition: Abnormal dentition. Dental caries present. No gingival swelling or dental abscesses.     Pharynx: No posterior oropharyngeal erythema.     Comments: Thick white coating on tongue. Eyes:     General: No scleral icterus.    Conjunctiva/sclera: Conjunctivae normal.  Cardiovascular:     Rate and Rhythm: Normal rate and regular rhythm.     Heart sounds: Normal heart sounds.  Pulmonary:     Effort: Pulmonary effort is normal. No respiratory distress.     Breath sounds: Normal breath sounds.  Musculoskeletal:        General: No edema.     Cervical back: Neck supple.  Skin:    General: Skin is warm and dry.  Neurological:     General: No focal deficit present.     Mental Status: He is alert. Mental status is at baseline.     Motor: No weakness.     Gait: Gait normal.  Psychiatric:        Mood and Affect: Mood and affect and mood normal.        Behavior: Behavior normal.        Thought Content: Thought  content normal.      UC Treatments / Results  Labs (all labs ordered are listed, but only abnormal results are displayed) Labs Reviewed - No data to display  EKG   Radiology No results found.  Procedures Procedures (including critical care time)  Medications Ordered in UC Medications - No data to display  Initial Impression / Assessment and Plan / UC Course  I have reviewed the triage vital signs and the nursing notes.  Pertinent labs & imaging results that were available during my care of the patient were reviewed by me and considered in my medical decision making (see chart for details).   Treating oral candidiasis with nystatin.  Advised him to keep tight control over his blood glucose since that increases his possibility of having recurrent yeast infections in his throat.  Advised to increase rest and fluids.  Advised to keep his appointment with his dentist since he does have multiple dental caries and recently had the abscess.  Advised to follow-up with our clinic as needed.  Final Clinical Impressions(s) / UC Diagnoses   Final diagnoses:  Oral candidiasis  Dental caries   Discharge Instructions   None    ED Prescriptions    Medication Sig Dispense Auth. Provider   nystatin (MYCOSTATIN) 100000 UNIT/ML suspension Swish and retain 5 ml in mouth as long as possible QID x 7-14 days 200 mL Gareth Morgan     PDMP  not reviewed this encounter.   Danton Clap, PA-C 12/29/20 1425

## 2020-12-29 NOTE — ED Triage Notes (Signed)
Patient in today c/o oral pain and lesions on his tongue x 2 months. Patient seen at [facility] on 12/11/20 for same. Patient states he finished the antibiotics and has appointment with a dentist on 01/02/21.

## 2021-01-01 ENCOUNTER — Ambulatory Visit: Payer: Medicare Other | Admitting: Pharmacist

## 2021-01-01 ENCOUNTER — Other Ambulatory Visit: Payer: Self-pay | Admitting: Family Medicine

## 2021-01-01 DIAGNOSIS — Z794 Long term (current) use of insulin: Secondary | ICD-10-CM

## 2021-01-01 DIAGNOSIS — E1169 Type 2 diabetes mellitus with other specified complication: Secondary | ICD-10-CM

## 2021-01-01 MED ORDER — EMPAGLIFLOZIN 25 MG PO TABS
25.0000 mg | ORAL_TABLET | Freq: Every day | ORAL | 5 refills | Status: DC
Start: 2021-01-01 — End: 2021-01-21

## 2021-01-01 NOTE — Patient Instructions (Signed)
Visit Information  Care Plan : PharmD - Disease Management T2DM, osteoarthritis  Updates made by Vella Raring, [place] since 01/01/2021 12:00 AM    Problem: Disease Progression     Long-Range Goal: Disease Progression Prevented or Minimized   Start Date: 11/06/2020  Expected End Date: 01/05/2021  Recent Progress: On track  Priority: High  Note:   Current Barriers:  . Unable to independently afford treatment regimen. Reports currently in coverage gap of [employer] Part D plan  Pharmacist Clinical Goal(s):  Marland Kitchen Over the next 60 days, patient will verbalize ability to afford treatment regimen. through collaboration with PharmD and provider.   Interventions: . Inter-disciplinary care team collaboration (see longitudinal plan of care) . Perform chart review. Note patient seen at Urgent Care on 1/23 for oral candidiasis/mouth pain. Patient received Rx for: nystatin suspension - Swish and retain 5 ml in mouth as long as possible QID x 7-14 days  . Today patient reports improvement in symptoms with use of nystatin suspension and confirms will continue to use as directed . Reports has appointment with dentist tomorrow to follow up about dental abscesses   Type 2 Diabetes  . Reports taking: ? Ozempic 0.5 mg weekly ? Toujeo 60 units daily in evening  ? Humalog - Reports taking three times daily with meals following sliding scale from previous Endocrinologist in [place] ? Has reported sliding scale based on blood sugar prior to each meal: ? If less than 140: takes 13 units ? If 140-200: takes 14 units ? If 200-250: takes 15 units ? If 250 or above: takes 16 units ? Metformin ER 500 mg - 2 tablets (1000 mg) twice daily ? Pioglitazone 15 mg once daily ? Reports has been out of Jardiance for ~ 1 month as had difficulty with obtaining this Rx from pharmacy . Reports fasting blood sugars ranging this week: 160s-170s ? Denies recent low blood sugars ? Have counseled patient on s/s  of low blood sugar and how to treat lows . Place coordination of care call to [place] with patient on line and speak with [facility], who denies pharmacy having received Jardiance 25 mg Rx that per local record was e-prescribed to pharmacy by PCP on 12/20. Marland Kitchen Patient reports has been working on improving lifestyle, including limiting carbohydrate portion sizes and increasing exercise ? Reports currently exercising ~15 minutes/day (walking and stationary bike) x 7 days/week . Collaborate today with PCP regarding patient's latest blood sugar readings and diabetes medication management.  ? Provider resends Rx for Jardiance 25 mg daily to [facility].  ? Provider agrees with plan for patient to reduce Toujeo dose from 60 units daily to 50 units daily when restarts Jardiance. . Follow up with patient again. Mr. gregary blackard understanding of plan per PCP to: 1) restart Jardiance 25 mg daily and 2) reduce Toujeo dose from 60 units daily to 50 units daily when restarts Jardiance ? Patient confirms will continue to monitor home CBGs, keep log of results and to contact office for any low blood sugar readings of readings outside established parameters   Hypertension . Reports taking: ? Chlorthalidone 25 mg daily ? Losartan 50 mg daily . Reports monitors home blood pressure with wrist monitor . Recalls recent readings running 120s-130s/70s-80s . Have encouraged patient to obtain upper arm blood pressure monitor for greater accuarcy of upper arm vs wrist monitor ? Patient denies need for upper arm monitor. States prefers wrist monitor . Encouraged patient to monitor home blood pressure,  keep log of results and bring log with him to medical appointments  Medication Assistance . Received message from Oliver Simcox letting me know that she has not yet received patient assistance paperwork back from patient . Mr. Guitron reports that he has not returned applications as was waiting until he  had completed his 2021 taxes to send this document.  . Review options for proof of income with patient and patient states that he return applications to [place] with one of these documents instead . Will continue to collaborate with [facility] Susy Frizzle for assistance to patient with completing assistance applications with [employer] and [employer] for 2022 calendar year  Medication Adherence . Note patient uses weekly pillbox to manage his medications  Patient Goals/Self-Care Activities . Over the next 60 days, patient will:  - collaborate with provider on medication access solutions -patient to call office for new or worsening medical concerns  Follow Up Plan: Telephone follow up appointment with care management team member scheduled for: 2/9 at 9:45 am      The patient verbalized understanding of instructions, educational materials, and care plan provided today and declined offer to receive copy of patient instructions, educational materials, and care plan.   Harlow Asa, PharmD, [place] [phone]

## 2021-01-01 NOTE — Chronic Care Management (AMB) (Signed)
Chronic Care Management Pharmacy Note  01/01/2021 Name:  Eric Andrews MRN:  [id number] DOB:  1951-11-14  Subjective: Eric Andrews is an 70 y.o. year old male who is Andrews primary patient of Eric Hauser, DO.  The CCM team was consulted for assistance with disease management and care coordination needs.    Engaged with patient by telephone for follow up visit in response to provider referral for pharmacy case management and/or care coordination services.   Consent to Services:  The patient was given information about Chronic Care Management services, agreed to services, and gave verbal consent prior to initiation of services.  Please see initial visit note for detailed documentation.   Objective:  Lab Results  Component Value Date   CREATININE 1.08 06/18/2020   CREATININE 1.1 12/19/2019    Lab Results  Component Value Date   HGBA1C 7.4 (Andrews) 10/10/2020       Component Value Date/Time   CHOL 114 06/18/2020 0857   TRIG 181 (H) 06/18/2020 0857   HDL 34 (L) 06/18/2020 0857   CHOLHDL 3.4 06/18/2020 0857   LDLCALC 54 06/18/2020 0857    BP Readings from Last 3 Encounters:  12/29/20 (!) 150/84  12/11/20 (!) 151/87  10/10/20 137/80    Assessment: Review of patient past medical history, allergies, medications, health status, including review of consultants reports, laboratory and other test data, was performed as part of comprehensive evaluation and provision of chronic care management services.   SDOH:  (Social Determinants of Health) assessments and interventions performed: yes   CCM Care Plan  Allergies  Allergen Reactions  . Dust Mite Extract     Medications Reviewed Today    Reviewed by Sonnie Alamo, [place] (Certified Medical Assistant) on 12/29/20 at 1357  Med List Status: <None>  Medication Order Taking? Sig Documenting Provider Last Dose Status Informant  aspirin 81 MG EC tablet [id number] Yes Take 81 mg by mouth daily. [provider]  Active    atorvastatin (LIPITOR) 20 MG tablet [id number] Yes TAKE 1 TABLET BY MOUTH AT BEDTIME Eric Hauser, DO  Active   busPIRone (BUSPAR) 5 MG tablet [id number] Yes TAKE 1 TABLET(5 MG) BY MOUTH TWICE DAILY AS NEEDED FOR ANXIETY [name], [name], DO  Active   celecoxib (CELEBREX) 200 MG capsule [id number] Yes Take 200 mg by mouth 2 (two) times daily. [provider]  Active   cetirizine (ZYRTEC) 10 MG tablet [id number] Yes Take 10 mg by mouth at bedtime. [provider]  Active   chlorthalidone (HYGROTON) 25 MG tablet [id number] Yes Take 1 tablet (25 mg total) by mouth daily. Karamalegos, [name], DO  Active   cholecalciferol (VITAMIN D3) 25 MCG (1000 UNIT) tablet [id number] Yes Take 1,000 Units by mouth daily. [provider]  Active   fenofibrate micronized (LOFIBRA) 134 MG capsule [id number] Yes Take by mouth. [provider]  Active   finasteride (PROSCAR) 5 MG tablet [id number] Yes Take 1 tablet (5 mg total) by mouth every evening. Karamalegos, [name], DO  Active   gabapentin (NEURONTIN) 100 MG capsule [id number] Yes Take 2 capsules by mouth 2 (two) times daily. [provider]  Active   Insulin Glargine (TOUJEO SOLOSTAR [place]) [id number] Yes Inject 50 Units into the skin every evening. [provider]  Active            Med Note Eric Andrews, Martrice Apt Andrews   Fri Nov 15, 2020 11:28 AM) Reports taking 60 units daily in evening  insulin lispro (HUMALOG) 100  UNIT/ML injection [id number] Yes Inject 13 Units into the skin 2 (two) times daily with Andrews meal. [provider]  Active            Med Note Eric Andrews, Eric Andrews   Fri Nov 15, 2020 11:13 AM) Reports taking according to sliding scale from previous Endocrinologist in [place]  Insulin Pen Needle 31G X 5 MM MISC [id number] Yes Use with insulin to inject into skin 3 times daily as directed Eric Hauser, DO  Active   JARDIANCE 25 MG TABS tablet [id number]  TAKE 1 TABLET BY  MOUTH EVERY DAY Karamalegos, [name], DO  Consider Medication Status and Discontinue (Discontinued by provider)   losartan (COZAAR) 50 MG tablet [id number] Yes Take 50 mg by mouth daily. [provider]  Active   metFORMIN (GLUCOPHAGE-XR) 500 MG 24 hr tablet [id number] Yes Take 2 tablets (1,000 mg total) by mouth 2 (two) times daily with Andrews meal. [name], [name], DO  Active   Multiple Vitamin (MULTIVITAMIN) capsule [id number] Yes Take 1 capsule by mouth daily. [provider]  Active   Omega-3 Fatty Acids (FISH OIL) 1000 MG CAPS [id number] Yes Take by mouth in the morning and at bedtime. [provider]  Active   OZEMPIC, 0.25 OR 0.5 MG/DOSE, 2 MG/1.5ML SOPN [id number] Yes Inject 0.5 mg into the skin once Andrews week. Eric Hauser, DO  Active   pioglitazone (ACTOS) 15 MG tablet [id number] Yes Take 1 tablet (15 mg total) by mouth daily. Eric Hauser, DO  Active   tamsulosin (FLOMAX) 0.4 MG CAPS capsule [id number] Yes TAKE ONE CAPSULE BY MOUTH EVERY NIGHT AT BEDTIME FOR URINE RETENTION Eric Hauser, DO  Active   traZODone (DESYREL) 100 MG tablet [id number] Yes TAKE 1 TABLET BY MOUTH AT BEDTIME Karamalegos, [name], DO  Active   venlafaxine XR (EFFEXOR-XR) 150 MG 24 hr capsule [id number] Yes Take 150 mg by mouth daily with breakfast. [provider]  Active           Patient Active Problem List   Diagnosis Date Noted  . Morbid obesity with BMI of 40.0-44.9, adult ([place]) 06/26/2020  . Major depressive disorder, recurrent, moderate ([place]) 06/25/2020  . GAD (generalized anxiety disorder) 06/25/2020  . Type 2 diabetes mellitus with other specified complication ([place]) 10/62/6948  . Chronic pain of right knee 05/27/2020  . Primary osteoarthritis involving multiple joints 05/27/2020  . Cataract associated with type 2 diabetes mellitus ([place]) 05/27/2020  . History of torn meniscus of left knee 05/27/2020  . DDD (degenerative disc  disease), lumbar 05/27/2020    Conditions to be addressed/monitored: HTN and DMII  Care Plan : PharmD - Disease Management T2DM, osteoarthritis  Updates made by Vella Raring, [place] since 01/01/2021 12:00 AM    Problem: Disease Progression     Long-Range Goal: Disease Progression Prevented or Minimized   Start Date: 11/06/2020  Expected End Date: 01/05/2021  Recent Progress: On track  Priority: High  Note:   Current Barriers:  . Unable to independently afford treatment regimen. Reports currently in coverage gap of [employer] Part D plan  Pharmacist Clinical Goal(s):  Marland Kitchen Over the next 60 days, patient will verbalize ability to afford treatment regimen. through collaboration with PharmD and provider.   Interventions: . Inter-disciplinary care team collaboration (see longitudinal plan of care) . Perform chart review. Note patient seen at Urgent Care on 1/23 for oral candidiasis/mouth pain. Patient received Rx for: nystatin suspension - Swish and retain  5 ml in mouth as long as possible QID x 7-14 days  . Today patient reports improvement in symptoms with use of nystatin suspension and confirms will continue to use as directed . Reports has appointment with dentist tomorrow to follow up about dental abscesses   Type 2 Diabetes  . Reports taking: ? Ozempic 0.5 mg weekly ? Toujeo 60 units daily in evening  ? Humalog - Reports taking three times daily with meals following sliding scale from previous Endocrinologist in [place] ? Has reported sliding scale based on blood sugar prior to each meal: ? If less than 140: takes 13 units ? If 140-200: takes 14 units ? If 200-250: takes 15 units ? If 250 or above: takes 16 units ? Metformin ER 500 mg - 2 tablets (1000 mg) twice daily ? Pioglitazone 15 mg once daily ? Reports has been out of Jardiance for ~ 1 month as had difficulty with obtaining this Rx from pharmacy . Reports fasting blood sugars ranging this week:  160s-170s ? Denies recent low blood sugars ? Have counseled patient on s/s of low blood sugar and how to treat lows . Place coordination of care call to [place] with patient on line and speak with [facility], who denies pharmacy having received Jardiance 25 mg Rx that per local record was e-prescribed to pharmacy by PCP on 12/20. Marland Kitchen Patient reports has been working on improving lifestyle, including limiting carbohydrate portion sizes and increasing exercise ? Reports currently exercising ~15 minutes/day (walking and stationary bike) x 7 days/week . Collaborate today with PCP regarding patient's latest blood sugar readings and diabetes medication management.  ? Provider resends Rx for Jardiance 25 mg daily to [facility].  ? Provider agrees with plan for patient to reduce Toujeo dose from 60 units daily to 50 units daily when restarts Jardiance. . Follow up with patient again. Mr. benton tooker understanding of plan per PCP to: 1) restart Jardiance 25 mg daily and 2) reduce Toujeo dose from 60 units daily to 50 units daily when restarts Jardiance ? Patient confirms will continue to monitor home CBGs, keep log of results and to contact office for any low blood sugar readings of readings outside established parameters   Hypertension . Reports taking: ? Chlorthalidone 25 mg daily ? Losartan 50 mg daily . Reports monitors home blood pressure with wrist monitor . Recalls recent readings running 120s-130s/70s-80s . Have encouraged patient to obtain upper arm blood pressure monitor for greater accuarcy of upper arm vs wrist monitor ? Patient denies need for upper arm monitor. States prefers wrist monitor . Encouraged patient to monitor home blood pressure, keep log of results and bring log with him to medical appointments  Medication Assistance . Received message from [place] Simcox letting me know that she has not yet received patient assistance paperwork back from patient . Mr.  Millikan reports that he has not returned applications as was waiting until he had completed his 2021 taxes to send this document.  . Review options for proof of income with patient and patient states that he return applications to [place] with one of these documents instead . Will continue to collaborate with [facility] CPhT Susy Frizzle for assistance to patient with completing assistance applications with [employer] and [employer] for 2022 calendar year  Medication Adherence . Note patient uses weekly pillbox to manage his medications  Patient Goals/Self-Care Activities . Over the next 60 days, patient will:  - collaborate with provider on medication access  solutions -patient to call office for new or worsening medical concerns  Follow Up Plan: Telephone follow up appointment with care management team member scheduled for: 2/9 at 9:45 am     Follow Up:  Patient agrees to Care Plan and Follow-up.  Harlow Asa, PharmD, [place] [phone]

## 2021-01-07 DIAGNOSIS — M4802 Spinal stenosis, cervical region: Secondary | ICD-10-CM | POA: Diagnosis not present

## 2021-01-07 DIAGNOSIS — M542 Cervicalgia: Secondary | ICD-10-CM | POA: Diagnosis not present

## 2021-01-07 DIAGNOSIS — M5412 Radiculopathy, cervical region: Secondary | ICD-10-CM | POA: Diagnosis not present

## 2021-01-10 DIAGNOSIS — G4733 Obstructive sleep apnea (adult) (pediatric): Secondary | ICD-10-CM | POA: Diagnosis not present

## 2021-01-15 ENCOUNTER — Other Ambulatory Visit: Payer: Self-pay | Admitting: Family Medicine

## 2021-01-15 ENCOUNTER — Ambulatory Visit (INDEPENDENT_AMBULATORY_CARE_PROVIDER_SITE_OTHER): Payer: Medicare Other | Admitting: Pharmacist

## 2021-01-15 DIAGNOSIS — E1169 Type 2 diabetes mellitus with other specified complication: Secondary | ICD-10-CM

## 2021-01-15 DIAGNOSIS — B370 Candidal stomatitis: Secondary | ICD-10-CM

## 2021-01-15 MED ORDER — NYSTATIN 100000 UNIT/ML MT SUSP: OROMUCOSAL | 0 refills | Status: DC

## 2021-01-15 NOTE — Patient Instructions (Signed)
Visit Information  PATIENT GOALS: Goals Addressed            This Visit's Progress   . Monitoring/follow up       -Monitor home CBGs, keep log of results and contact office for any low blood sugar readings or readings outside established parameters  -Continue to monitor home blood pressure, keep log of results and bring log with him to medical appointments  -Contact PCP in 1-2 weeks if needed for mouth symptoms or for any new or worsening medical concerns       The patient verbalized understanding of instructions, educational materials, and care plan provided today and declined offer to receive copy of patient instructions, educational materials, and care plan.   Telephone follow up appointment with care management team member scheduled for: 2/23 at 3:15 pm  Harlow Asa, PharmD, [place] [phone]

## 2021-01-15 NOTE — Chronic Care Management (AMB) (Signed)
Chronic Care Management Pharmacy Note  01/15/2021 Name:  Eric Andrews MRN:  [id number] DOB:  11/18/1951  Subjective: Eric Andrews is an 70 y.o. year old male who is a primary patient of [name], DO.  The CCM team was consulted for assistance with disease management and care coordination needs.    Engaged with patient by telephone for follow up visit in response to provider referral for pharmacy case management and/or care coordination services.   Consent to Services:  The patient was given information about Chronic Care Management services, agreed to services, and gave verbal consent prior to initiation of services.  Please see initial visit note for detailed documentation.   Objective:  Lab Results  Component Value Date   CREATININE 1.08 06/18/2020   CREATININE 1.1 12/19/2019    Lab Results  Component Value Date   HGBA1C 7.4 (A) 10/10/2020       Component Value Date/Time   CHOL 114 06/18/2020 0857   TRIG 181 (H) 06/18/2020 0857   HDL 34 (L) 06/18/2020 0857   CHOLHDL 3.4 06/18/2020 0857   LDLCALC 54 06/18/2020 0857    BP Readings from Last 3 Encounters:  12/29/20 (!) 150/84  12/11/20 (!) 151/87  10/10/20 137/80    Assessment: Review of patient past medical history, allergies, medications, health status, including review of consultants reports, laboratory and other test data, was performed as part of comprehensive evaluation and provision of chronic care management services.   SDOH:  (Social Determinants of Health) assessments and interventions performed: yes  CCM Care Plan  Allergies  Allergen Reactions  . Dust Mite Extract     Medications Reviewed Today    Reviewed by Sonnie Alamo, [place] (Certified Medical Assistant) on 12/29/20 at 1357  Med List Status: <None>  Medication Order Taking? Sig Documenting Provider Last Dose Status Informant  aspirin 81 MG EC tablet [id number] Yes Take 81 mg by mouth daily. [provider]  Active    atorvastatin (LIPITOR) 20 MG tablet [id number] Yes TAKE 1 TABLET BY MOUTH AT BEDTIME [name], DO  Active   busPIRone (BUSPAR) 5 MG tablet [id number] Yes TAKE 1 TABLET(5 MG) BY MOUTH TWICE DAILY AS NEEDED FOR ANXIETY [name], [name], DO  Active   celecoxib (CELEBREX) 200 MG capsule [id number] Yes Take 200 mg by mouth 2 (two) times daily. [provider]  Active   cetirizine (ZYRTEC) 10 MG tablet [id number] Yes Take 10 mg by mouth at bedtime. [provider]  Active   chlorthalidone (HYGROTON) 25 MG tablet [id number] Yes Take 1 tablet (25 mg total) by mouth daily. Karamalegos, [name], DO  Active   cholecalciferol (VITAMIN D3) 25 MCG (1000 UNIT) tablet [id number] Yes Take 1,000 Units by mouth daily. [provider]  Active   fenofibrate micronized (LOFIBRA) 134 MG capsule [id number] Yes Take by mouth. [provider]  Active   finasteride (PROSCAR) 5 MG tablet [id number] Yes Take 1 tablet (5 mg total) by mouth every evening. Karamalegos, [name], DO  Active   gabapentin (NEURONTIN) 100 MG capsule [id number] Yes Take 2 capsules by mouth 2 (two) times daily. [provider]  Active   Insulin Glargine (TOUJEO SOLOSTAR [place]) [id number] Yes Inject 50 Units into the skin every evening. [provider]  Active            Med Note Winfield Cunas, Jourden Gilson A   Fri Nov 15, 2020 11:28 AM) Reports taking 60 units daily in evening  insulin lispro (HUMALOG) 100 UNIT/ML  injection [id number] Yes Inject 13 Units into the skin 2 (two) times daily with a meal. [provider]  Active            Med Note Winfield Cunas, Maddyson Keil A   Fri Nov 15, 2020 11:13 AM) Reports taking according to sliding scale from previous Endocrinologist in [place]  Insulin Pen Needle 31G X 5 MM MISC [id number] Yes Use with insulin to inject into skin 3 times daily as directed [name], DO  Active   JARDIANCE 25 MG TABS tablet [id number]  TAKE 1 TABLET BY  MOUTH EVERY DAY Karamalegos, [name], DO  Consider Medication Status and Discontinue (Discontinued by provider)   losartan (COZAAR) 50 MG tablet [id number] Yes Take 50 mg by mouth daily. [provider]  Active   metFORMIN (GLUCOPHAGE-XR) 500 MG 24 hr tablet [id number] Yes Take 2 tablets (1,000 mg total) by mouth 2 (two) times daily with a meal. [name], [name], DO  Active   Multiple Vitamin (MULTIVITAMIN) capsule [id number] Yes Take 1 capsule by mouth daily. [provider]  Active   Omega-3 Fatty Acids (FISH OIL) 1000 MG CAPS [id number] Yes Take by mouth in the morning and at bedtime. [provider]  Active   OZEMPIC, 0.25 OR 0.5 MG/DOSE, 2 MG/1.5ML SOPN [id number] Yes Inject 0.5 mg into the skin once a week. [name], DO  Active   pioglitazone (ACTOS) 15 MG tablet [id number] Yes Take 1 tablet (15 mg total) by mouth daily. [name], DO  Active   tamsulosin (FLOMAX) 0.4 MG CAPS capsule [id number] Yes TAKE ONE CAPSULE BY MOUTH EVERY NIGHT AT BEDTIME FOR URINE RETENTION [name], DO  Active   traZODone (DESYREL) 100 MG tablet [id number] Yes TAKE 1 TABLET BY MOUTH AT BEDTIME Karamalegos, [name], DO  Active   venlafaxine XR (EFFEXOR-XR) 150 MG 24 hr capsule [id number] Yes Take 150 mg by mouth daily with breakfast. [provider]  Active           Patient Active Problem List   Diagnosis Date Noted  . Morbid obesity with BMI of 40.0-44.9, adult ([place]) 06/26/2020  . Major depressive disorder, recurrent, moderate ([place]) 06/25/2020  . GAD (generalized anxiety disorder) 06/25/2020  . Type 2 diabetes mellitus with other specified complication ([place]) 61/60/7371  . Chronic pain of right knee 05/27/2020  . Primary osteoarthritis involving multiple joints 05/27/2020  . Cataract associated with type 2 diabetes mellitus ([place]) 05/27/2020  . History of torn meniscus of left knee 05/27/2020  . DDD (degenerative disc  disease), lumbar 05/27/2020    Conditions to be addressed/monitored: HTN, HLD and DMII  Care Plan : PharmD - Disease Management T2DM, osteoarthritis  Updates made by Vella Raring, [place] since 01/15/2021 12:00 AM    Problem: Disease Progression     Long-Range Goal: Disease Progression Prevented or Minimized   Start Date: 11/06/2020  Expected End Date: 01/05/2021  Recent Progress: On track  Priority: High  Note:   Current Barriers:  . Unable to independently afford treatment regimen, particularly when enters coverage gap of [employer] Part D plan  Pharmacist Clinical Goal(s):  Marland Kitchen Over the next 60 days, patient will verbalize ability to afford treatment regimen. through collaboration with PharmD and provider.   Interventions: . Inter-disciplinary care team collaboration (see longitudinal plan of care) . Perform chart review. Patient seen on 2/1 for Initial Consult with Orthopedic Surgery / Physical Medicine. Provider advised patient to: o Increase gabapentin to 300  mg three times daily o Start PT 1-2 times a week for 6 to 8 weeks . Today patient reports increased gabapentin as directed and has noticed some improvement in symptoms; denies any dizziness or sleepiness with dose increase  . Today patient reports symptoms in his mouth have returned (sores and sensitivity on tongue; bad breath) since stopped using Nystatin suspension o Note patient seen at Urgent Care on 1/23 for oral candidiasis/mouth pain. Patient received Rx for: nystatin suspension - Swish and retain 5 ml in mouth as long as possible QID x 7-14 days o Patient reports symptoms resolved with use of suspension, but returned after ran out of suspension after ~9 days o Collaborate with PCP today. Provider re-orders 14 day supply re-trial of Nystatin suspension with plan for patient to follow up with PCP within 1-2 weeks if needed o Follow up again with patient to provide these instructions per PCP. - Review  administration instruction with patient - to swish in the mouth and retain for as long as possible (several minutes) before swallowing four times daily  Type 2 Diabetes  . Reports taking: ? Ozempic 0.5 mg weekly ? Toujeo 50 units daily in evening (dose decreased 2 weeks ago with restart of Jardiance) ? Humalog - Reports taking three times daily with meals following sliding scale from previous Endocrinologist in [place] ? Has reported sliding scale based on blood sugar prior to each meal: ? If less than 140: takes 13 units ? If 140-200: takes 14 units ? If 200-250: takes 15 units ? If 250 or above: takes 16 units ? Metformin ER 500 mg - 2 tablets (1000 mg) twice daily ? Pioglitazone 15 mg once daily ? Jardiance 25 mg daily (restarted 2 weeks ago) . Patient denies missed doses . Reports home blood sugars ranging: morning fasting: 210-220; before lunch: 180s-200; before supper: 200-220 ? Denies recent low blood sugars ? Have counseled patient on s/s of low blood sugar and how to treat lows . Reports continuing to work on improving lifestyle, including limiting carbohydrate portion sizes and increasing exercise ? Reports currently exercising >15 minutes/day (walking and stationary bike) x 7 days/week ? Confirms staying hydrated. Encourage patient to drink water, rather than beverages sweetened with sugar for hydration.  ? Counsel on importance of reviewing nutrition labels for carbohydrate content. Roney Marion today with PCP regarding patient's latest blood sugar readings and diabetes medication management.   ? Provider agrees with plan for patient to increase Toujeo dose from 50 units daily to 54 units daily with instruction that patient may further increase dose by 1-2 units every 7 days if fasting blood sugars consistently >150 mg/dL. ? Follow up with patient again. Mr. bertis hustead understanding of this plan via teach back ? Patient confirms will continue to monitor home CBGs, keep  log of results and to contact office for any low blood sugar readings of readings outside established parameters   Hypertension . Reports taking: ? Chlorthalidone 25 mg daily ? Losartan 50 mg daily . Note patient monitors home blood pressure with wrist monitor . Recalls recent readings running 120s-130s/70s-80s . Have encouraged patient to obtain upper arm blood pressure monitor for greater accuarcy of upper arm vs wrist monitor ? Patient denies need for upper arm monitor. States prefers wrist monitor . Have encouraged patient to monitor home blood pressure, keep log of results and bring log with him to medical appointments  Medication Assistance . Received message from [place] Simcox letting me know that she  received [employer] patient assistance application back from patient, but not yet the envelope with the [employer] application. [facility] CPhT has spoken with patient and plans to await this second envelope from patient and will follow up with him if not received o [facility] CPhT faxed patient and provider portion of [employer] assistance application to company on 2/1 . Will continue to collaborate with [facility] CPhT Susy Frizzle for assistance to patient with completing assistance applications with [employer] and [employer] for 2022 calendar year  Medication Adherence . Note patient uses weekly pillbox to manage his medications  Patient Goals/Self-Care Activities . Over the next 60 days, patient will:  - collaborate with provider on medication access solutions -patient to call office for new or worsening medical concerns  Follow Up Plan: Telephone follow up appointment with care management team member scheduled for: 2/23 at 3:15 pm     Medication Assistance: Application for [employer] and [employer]  medication assistance programs. in process.  See plan of care for additional detail.  Follow Up:  Patient agrees to Care Plan and  Follow-up.  Harlow Asa, PharmD, [place] [phone]

## 2021-01-16 ENCOUNTER — Other Ambulatory Visit: Payer: Self-pay | Admitting: Family Medicine

## 2021-01-16 DIAGNOSIS — Z794 Long term (current) use of insulin: Secondary | ICD-10-CM

## 2021-01-16 DIAGNOSIS — E1169 Type 2 diabetes mellitus with other specified complication: Secondary | ICD-10-CM

## 2021-01-16 MED ORDER — TOUJEO SOLOSTAR 300 UNIT/ML ~~LOC~~ SOPN: 54.0000 [IU] | PEN_INJECTOR | Freq: Every evening | SUBCUTANEOUS | 5 refills | Status: DC

## 2021-01-16 NOTE — Telephone Encounter (Signed)
Requested medication (s) are due for refill today -yes  Requested medication (s) are on the active medication list -yes  Future visit scheduled -no  Last refill: 7 months ago  Notes to clinic: Rx refill request- historical provider  Requested Prescriptions  Pending Prescriptions Disp Refills   insulin glargine, 1 Unit Dial, (TOUJEO SOLOSTAR) 300 UNIT/ML Solostar Pen      Sig: Inject 50 Units into the skin every evening.      Endocrinology:  Diabetes - Insulins Passed - 01/16/2021  3:47 PM      Passed - HBA1C is between 0 and 7.9 and within 180 days    Hemoglobin A1C  Date Value Ref Range Status  10/10/2020 7.4 (A) 4.0 - 5.6 % Final   Hgb A1c MFr Bld  Date Value Ref Range Status  06/18/2020 7.9 (H) <5.7 % of total Hgb Final    Comment:    For someone without known diabetes, a hemoglobin A1c value of 6.5% or greater indicates that they may have  diabetes and this should be confirmed with a follow-up  test. . For someone with known diabetes, a value <7% indicates  that their diabetes is well controlled and a value  greater than or equal to 7% indicates suboptimal  control. A1c targets should be individualized based on  duration of diabetes, age, comorbid conditions, and  other considerations. . Currently, no consensus exists regarding use of hemoglobin A1c for diagnosis of diabetes for children. Renella Cunas - Valid encounter within last 6 months    Recent Outpatient Visits           3 months ago Type 2 diabetes mellitus with other specified complication, with long-term current use of insulin [facility])   [place], DO   4 months ago Dyspnea on exertion   [facility], [name], DO   6 months ago Type 2 diabetes mellitus with other specified complication, with long-term current use of insulin [facility])   [facility], [name], DO   7 months ago Type 2 diabetes  mellitus with other specified complication, with long-term current use of insulin [facility])   [facility] [name], DO                    Requested Prescriptions  Pending Prescriptions Disp Refills   insulin glargine, 1 Unit Dial, (TOUJEO SOLOSTAR) 300 UNIT/ML Solostar Pen      Sig: Inject 50 Units into the skin every evening.      Endocrinology:  Diabetes - Insulins Passed - 01/16/2021  3:47 PM      Passed - HBA1C is between 0 and 7.9 and within 180 days    Hemoglobin A1C  Date Value Ref Range Status  10/10/2020 7.4 (A) 4.0 - 5.6 % Final   Hgb A1c MFr Bld  Date Value Ref Range Status  06/18/2020 7.9 (H) <5.7 % of total Hgb Final    Comment:    For someone without known diabetes, a hemoglobin A1c value of 6.5% or greater indicates that they may have  diabetes and this should be confirmed with a follow-up  test. . For someone with known diabetes, a value <7% indicates  that their diabetes is well controlled and a value  greater than or equal to 7% indicates suboptimal  control. A1c targets should be individualized based on  duration of  diabetes, age, comorbid conditions, and  other considerations. . Currently, no consensus exists regarding use of hemoglobin A1c for diagnosis of diabetes for children. Renella Cunas - Valid encounter within last 6 months    Recent Outpatient Visits           3 months ago Type 2 diabetes mellitus with other specified complication, with long-term current use of insulin [facility])   [place], DO   4 months ago Dyspnea on exertion   [facility] [place], [name], DO   6 months ago Type 2 diabetes mellitus with other specified complication, with long-term current use of insulin [facility])   [place], DO   7 months ago Type 2 diabetes mellitus with other specified complication, with long-term current  use of insulin [facility])   [place], [name], DO

## 2021-01-16 NOTE — Telephone Encounter (Signed)
Medication Refill - Medication: Insulin Glargine (TOUJEO SOLOSTAR [place])  Almost out, please advise   Has the patient contacted their pharmacy? Yes.   (Agent: If no, request that the patient contact the pharmacy for the refill.) (Agent: If yes, when and what did the pharmacy advise?)  Preferred Pharmacy (with phone number or street name):  [facility] [place]  [place] [address]  Phone: [phone] Fax: [phone]     Agent: Please be advised that RX refills may take up to 3 business days. We ask that you follow-up with your pharmacy.

## 2021-01-18 ENCOUNTER — Other Ambulatory Visit: Payer: Self-pay | Admitting: Family Medicine

## 2021-01-18 DIAGNOSIS — F411 Generalized anxiety disorder: Secondary | ICD-10-CM

## 2021-01-21 ENCOUNTER — Other Ambulatory Visit: Payer: Self-pay | Admitting: Family Medicine

## 2021-01-21 ENCOUNTER — Ambulatory Visit: Payer: Self-pay | Admitting: Pharmacist

## 2021-01-21 DIAGNOSIS — E1169 Type 2 diabetes mellitus with other specified complication: Secondary | ICD-10-CM

## 2021-01-21 DIAGNOSIS — Z794 Long term (current) use of insulin: Secondary | ICD-10-CM

## 2021-01-21 NOTE — Patient Instructions (Addendum)
Visit Information  PATIENT GOALS: Goals Addressed    Monitoring/follow up           -Monitor home CBGs, keep log of results and contact office for any low blood sugar readings or readings outside established parameters  -Continue to monitor home blood pressure, keep log of results and bring log with him to medical appointments        The patient verbalized understanding of instructions, educational materials, and care plan provided today and declined offer to receive copy of patient instructions, educational materials, and care plan.   Telephone follow up appointment with care management team member scheduled for: 2/23 at 3:15 pm  Harlow Asa, PharmD, [place] [phone]

## 2021-01-21 NOTE — Chronic Care Management (AMB) (Signed)
Chronic Care Management Pharmacy Note  01/21/2021 Name:  Eric Andrews MRN:  [id number] DOB:  12-15-1950  Subjective: Eric Andrews is an 70 y.o. year old male who is a primary patient of [name], DO.  The CCM team was consulted for assistance with disease management and care coordination needs.    Receive follow up message from [place].  Engaged with patient by telephone for follow up visit in response to provider referral for pharmacy case management and/or care coordination services.   Consent to Services:  The patient was given information about Chronic Care Management services, agreed to services, and gave verbal consent prior to initiation of services.  Please see initial visit note for detailed documentation.   Objective:  Lab Results  Component Value Date   CREATININE 1.08 06/18/2020   CREATININE 1.1 12/19/2019    Lab Results  Component Value Date   HGBA1C 7.4 (A) 10/10/2020       Component Value Date/Time   CHOL 114 06/18/2020 0857   TRIG 181 (H) 06/18/2020 0857   HDL 34 (L) 06/18/2020 0857   CHOLHDL 3.4 06/18/2020 0857   LDLCALC 54 06/18/2020 0857    BP Readings from Last 3 Encounters:  12/29/20 (!) 150/84  12/11/20 (!) 151/87  10/10/20 137/80    Assessment: Review of patient past medical history, allergies, medications, health status, including review of consultants reports, laboratory and other test data, was performed as part of comprehensive evaluation and provision of chronic care management services.   SDOH:  (Social Determinants of Health) assessments and interventions performed: none   CCM Care Plan  Allergies  Allergen Reactions  . Dust Mite Extract     Medications Reviewed Today    Reviewed by Sonnie Alamo, [place] (Certified Medical Assistant) on 12/29/20 at 1357  Med List Status: <None>  Medication Order Taking? Sig Documenting Provider Last Dose Status Informant  aspirin 81 MG EC tablet [id number] Yes Take 81 mg by mouth  daily. [provider]  Active   atorvastatin (LIPITOR) 20 MG tablet [id number] Yes TAKE 1 TABLET BY MOUTH AT BEDTIME [name], DO  Active   busPIRone (BUSPAR) 5 MG tablet [id number] Yes TAKE 1 TABLET(5 MG) BY MOUTH TWICE DAILY AS NEEDED FOR ANXIETY [name], [name], DO  Active   celecoxib (CELEBREX) 200 MG capsule [id number] Yes Take 200 mg by mouth 2 (two) times daily. [provider]  Active   cetirizine (ZYRTEC) 10 MG tablet [id number] Yes Take 10 mg by mouth at bedtime. [provider]  Active   chlorthalidone (HYGROTON) 25 MG tablet [id number] Yes Take 1 tablet (25 mg total) by mouth daily. Karamalegos, [name], DO  Active   cholecalciferol (VITAMIN D3) 25 MCG (1000 UNIT) tablet [id number] Yes Take 1,000 Units by mouth daily. [provider]  Active   fenofibrate micronized (LOFIBRA) 134 MG capsule [id number] Yes Take by mouth. [provider]  Active   finasteride (PROSCAR) 5 MG tablet [id number] Yes Take 1 tablet (5 mg total) by mouth every evening. Karamalegos, [name], DO  Active   gabapentin (NEURONTIN) 100 MG capsule [id number] Yes Take 2 capsules by mouth 2 (two) times daily. [provider]  Active   Insulin Glargine (TOUJEO SOLOSTAR [place]) [id number] Yes Inject 50 Units into the skin every evening. [provider]  Active            Med Note [place] Cunas, [facility] A   Fri Nov 15, 2020 11:28 AM) Reports taking 60 units  daily in evening  insulin lispro (HUMALOG) 100 UNIT/ML injection [id number] Yes Inject 13 Units into the skin 2 (two) times daily with a meal. [provider]  Active            Med Note Winfield Cunas, Afra Tricarico A   Fri Nov 15, 2020 11:13 AM) Reports taking according to sliding scale from previous Endocrinologist in [place]  Insulin Pen Needle 31G X 5 MM MISC [id number] Yes Use with insulin to inject into skin 3 times daily as directed [name], DO  Active   JARDIANCE 25 MG  TABS tablet [id number]  TAKE 1 TABLET BY MOUTH EVERY DAY Karamalegos, [name], DO  Consider Medication Status and Discontinue (Discontinued by provider)   losartan (COZAAR) 50 MG tablet [id number] Yes Take 50 mg by mouth daily. [provider]  Active   metFORMIN (GLUCOPHAGE-XR) 500 MG 24 hr tablet [id number] Yes Take 2 tablets (1,000 mg total) by mouth 2 (two) times daily with a meal. [name], [name], DO  Active   Multiple Vitamin (MULTIVITAMIN) capsule [id number] Yes Take 1 capsule by mouth daily. [provider]  Active   Omega-3 Fatty Acids (FISH OIL) 1000 MG CAPS [id number] Yes Take by mouth in the morning and at bedtime. [provider]  Active   OZEMPIC, 0.25 OR 0.5 MG/DOSE, 2 MG/1.5ML SOPN [id number] Yes Inject 0.5 mg into the skin once a week. [name], DO  Active   pioglitazone (ACTOS) 15 MG tablet [id number] Yes Take 1 tablet (15 mg total) by mouth daily. [name], DO  Active   tamsulosin (FLOMAX) 0.4 MG CAPS capsule [id number] Yes TAKE ONE CAPSULE BY MOUTH EVERY NIGHT AT BEDTIME FOR URINE RETENTION [name], DO  Active   traZODone (DESYREL) 100 MG tablet [id number] Yes TAKE 1 TABLET BY MOUTH AT BEDTIME Karamalegos, [name], DO  Active   venlafaxine XR (EFFEXOR-XR) 150 MG 24 hr capsule [id number] Yes Take 150 mg by mouth daily with breakfast. [provider]  Active           Patient Active Problem List   Diagnosis Date Noted  . Morbid obesity with BMI of 40.0-44.9, adult ([place]) 06/26/2020  . Major depressive disorder, recurrent, moderate ([place]) 06/25/2020  . GAD (generalized anxiety disorder) 06/25/2020  . Type 2 diabetes mellitus with other specified complication ([place]) 51/70/0174  . Chronic pain of right knee 05/27/2020  . Primary osteoarthritis involving multiple joints 05/27/2020  . Cataract associated with type 2 diabetes mellitus ([place]) 05/27/2020  . History of torn meniscus of left knee  05/27/2020  . DDD (degenerative disc disease), lumbar 05/27/2020    Conditions to be addressed/monitored: HTN and DMII  Care Plan : PharmD - Disease Management T2DM, osteoarthritis  Updates made by Vella Raring, [place] since 01/21/2021 12:00 AM    Problem: Disease Progression     Long-Range Goal: Disease Progression Prevented or Minimized   Start Date: 11/06/2020  Expected End Date: 01/05/2021  Recent Progress: On track  Priority: High  Note:   Current Barriers:  . Unable to independently afford treatment regimen, particularly when enters coverage gap of [employer] Part D plan  Pharmacist Clinical Goal(s):  Marland Kitchen Over the next 60 days, patient will verbalize ability to afford treatment regimen. through collaboration with PharmD and provider.   Interventions: . Inter-disciplinary care team collaboration (see longitudinal plan of care) . Note patient due for 3 month follow up appointment with PCP. Patient confirms will call office to  schedule  Medication Assistance . Received coordination of care message from [place]. Reports patient's application for patient assistance for Jardiance denied due to income limit. Reports she received patient's application for [employer] patient assistance in the mail and faxed this application to program today . Follow up with patient regarding denial for Jardiance patient assistance. Note Wilder Glade is a therapeutic alternative to [place]. Based on reported income, patient meets eligibility criteria for Farxiga patient assistance through [employer].  o Patient expresses interest in this therapeutic change to help with medication access. o Collaborate with PCP. Provider agrees the therapeutic change from Jardiance to Agency in order for patient to receive medication from patient assistance program . Will collaborate with [facility] CPhT to request she assist patient with [employer] application as well as continue to assist with application to  Novo Nordisk  Type 2 Diabetes  . Reports taking: ? Ozempic 0.5 mg weekly ? Toujeo 54 units daily in evening  ? Note last instruction given to patient on 2/9, per PCP: may further increase dose by 1-2 units every 7 days if fasting blood sugars consistently >150 mg/dL ? Humalog - Reports taking three times daily with meals following sliding scale from previous Endocrinologist in [place] ? Has reported sliding scale based on blood sugar prior to each meal: ? If less than 140: takes 13 units ? If 140-200: takes 14 units ? If 200-250: takes 15 units ? If 250 or above: takes 16 units ? Metformin ER 500 mg - 2 tablets (1000 mg) twice daily ? Pioglitazone 15 mg once daily ? Jardiance 25 mg daily (restarted 2 weeks ago) . Reports home blood sugars ranging: this morning: 189; reports readings over past week running in low 200s.  ? Patient requests more control over adjustment of his Toujeo in order to gain better control of his blood sugar ? Denies recent low blood sugars and have counseled patient on s/s of low blood sugar and how to treat lows . Collaborate today with PCP regarding patient's latest blood sugar readings and diabetes medication management.   ? Provider agrees with plan for patient to increase Toujeo dose from 54 units daily to 56 units daily with instruction that patient may further increase dose by 1-2 units every 3 days if fasting blood sugars consistently >150 mg/dL. ? Follow up with patient again. Mr. jo cerone understanding of this plan via teach back ? Patient confirms will continue to monitor home CBGs, keep log of results and to contact office for any low blood sugar readings of readings outside established parameters  Medication Adherence . Note patient uses weekly pillbox to manage his medications  Patient Goals/Self-Care Activities . Over the next 60 days, patient will:  - collaborate with provider on medication access solutions -patient to call office for new  or worsening medical concerns  Follow Up Plan: Telephone follow up appointment with care management team member scheduled for: 2/23 at 3:15 pm    Follow Up:  Patient agrees to Care Plan and Follow-up.  Harlow Asa, PharmD, [place] [phone]

## 2021-01-27 DIAGNOSIS — R200 Anesthesia of skin: Secondary | ICD-10-CM | POA: Diagnosis not present

## 2021-01-27 DIAGNOSIS — M545 Low back pain, unspecified: Secondary | ICD-10-CM | POA: Diagnosis not present

## 2021-01-27 DIAGNOSIS — G4739 Other sleep apnea: Secondary | ICD-10-CM | POA: Diagnosis not present

## 2021-01-27 DIAGNOSIS — M5416 Radiculopathy, lumbar region: Secondary | ICD-10-CM | POA: Diagnosis not present

## 2021-01-27 DIAGNOSIS — G479 Sleep disorder, unspecified: Secondary | ICD-10-CM | POA: Diagnosis not present

## 2021-01-27 DIAGNOSIS — M542 Cervicalgia: Secondary | ICD-10-CM | POA: Diagnosis not present

## 2021-01-27 DIAGNOSIS — R937 Abnormal findings on diagnostic imaging of other parts of musculoskeletal system: Secondary | ICD-10-CM | POA: Diagnosis not present

## 2021-01-29 ENCOUNTER — Ambulatory Visit: Payer: Medicare Other | Admitting: Pharmacist

## 2021-01-29 ENCOUNTER — Other Ambulatory Visit: Payer: Self-pay | Admitting: Neurology

## 2021-01-29 DIAGNOSIS — E1169 Type 2 diabetes mellitus with other specified complication: Secondary | ICD-10-CM

## 2021-01-29 DIAGNOSIS — Z794 Long term (current) use of insulin: Secondary | ICD-10-CM

## 2021-01-29 DIAGNOSIS — B370 Candidal stomatitis: Secondary | ICD-10-CM

## 2021-01-29 DIAGNOSIS — M5416 Radiculopathy, lumbar region: Secondary | ICD-10-CM

## 2021-01-29 NOTE — Patient Instructions (Signed)
Visit Information  PATIENT GOALS: Goals Addressed            This Visit's Progress   . Pharmacy Goals       Our goal A1c is less than 7%. This corresponds with fasting sugars less than 130 and 2 hour after meal sugars less than 180. Please check your blood sugar and keep a log of the results  Please check your home blood pressure, keep a log of the results and bring this with you to your medical appointments.  Our goal bad cholesterol, or LDL, is less than 70 . This is why it is important to continue taking your atorvastatin.  Feel free to call me with any questions or concerns. I look forward to our next call!  Harlow Asa, PharmD, [place] [phone]       The patient verbalized understanding of instructions, educational materials, and care plan provided today and declined offer to receive copy of patient instructions, educational materials, and care plan.   Telephone follow up appointment with care management team member scheduled for:  3/18 at 10 am  Harlow Asa, PharmD, [place] [phone]

## 2021-01-29 NOTE — Chronic Care Management (AMB) (Signed)
Chronic Care Management Pharmacy Note  01/29/2021 Name:  Eric Andrews MRN:  [id number] DOB:  [birth year]  Subjective: Eric Andrews is an 70 y.o. year old male who is a primary patient of Eric Hauser, DO.  The CCM team was consulted for assistance with disease management and care coordination needs.    Engaged with patient by telephone for follow up visit in response to provider referral for pharmacy case management and/or care coordination services.   Consent to Services:  The patient was given information about Chronic Care Management services, agreed to services, and gave verbal consent prior to initiation of services.  Please see initial visit note for detailed documentation.   Objective:  Lab Results  Component Value Date   CREATININE 1.08 06/18/2020   CREATININE 1.1 12/19/2019    Lab Results  Component Value Date   HGBA1C 7.4 (A) 10/10/2020       Component Value Date/Time   CHOL 114 06/18/2020 0857   TRIG 181 (H) 06/18/2020 0857   HDL 34 (L) 06/18/2020 0857   CHOLHDL 3.4 06/18/2020 0857   LDLCALC 54 06/18/2020 0857    BP Readings from Last 3 Encounters:  12/29/20 (!) 150/84  12/11/20 (!) 151/87  10/10/20 137/80    Assessment: Review of patient past medical history, allergies, medications, health status, including review of consultants reports, laboratory and other test data, was performed as part of comprehensive evaluation and provision of chronic care management services.   SDOH:  (Social Determinants of Health) assessments and interventions performed: none   CCM Care Plan  Allergies  Allergen Reactions  . Dust Mite Extract     Medications Reviewed Today    Reviewed by Sonnie Alamo, [place] (Certified Medical Assistant) on 12/29/20 at 1357  Med List Status: <None>  Medication Order Taking? Sig Documenting Provider Last Dose Status Informant  aspirin 81 MG EC tablet [id number] Yes Take 81 mg by mouth daily. [provider]  Active    atorvastatin (LIPITOR) 20 MG tablet [id number] Yes TAKE 1 TABLET BY MOUTH AT BEDTIME Eric Hauser, DO  Active   busPIRone (BUSPAR) 5 MG tablet [id number] Yes TAKE 1 TABLET(5 MG) BY MOUTH TWICE DAILY AS NEEDED FOR ANXIETY [name], [name], DO  Active   celecoxib (CELEBREX) 200 MG capsule [id number] Yes Take 200 mg by mouth 2 (two) times daily. [provider]  Active   cetirizine (ZYRTEC) 10 MG tablet [id number] Yes Take 10 mg by mouth at bedtime. [provider]  Active   chlorthalidone (HYGROTON) 25 MG tablet [id number] Yes Take 1 tablet (25 mg total) by mouth daily. Karamalegos, [name], DO  Active   cholecalciferol (VITAMIN D3) 25 MCG (1000 UNIT) tablet [id number] Yes Take 1,000 Units by mouth daily. [provider]  Active   fenofibrate micronized (LOFIBRA) 134 MG capsule [id number] Yes Take by mouth. [provider]  Active   finasteride (PROSCAR) 5 MG tablet [id number] Yes Take 1 tablet (5 mg total) by mouth every evening. Karamalegos, [name], DO  Active   gabapentin (NEURONTIN) 100 MG capsule [id number] Yes Take 2 capsules by mouth 2 (two) times daily. [provider]  Active   Insulin Glargine (TOUJEO SOLOSTAR [place]) [id number] Yes Inject 50 Units into the skin every evening. [provider]  Active            Med Note Eric Andrews, Kendalyn Cranfield A   Fri Nov 15, 2020 11:28 AM) Reports taking 60 units daily in evening  insulin lispro (HUMALOG) 100  UNIT/ML injection [id number] Yes Inject 13 Units into the skin 2 (two) times daily with a meal. [provider]  Active            Med Note Eric Andrews, Tavious Andrews A   Fri Nov 15, 2020 11:13 AM) Reports taking according to sliding scale from previous Endocrinologist in [place]  Insulin Pen Needle 31G X 5 MM MISC [id number] Yes Use with insulin to inject into skin 3 times daily as directed Eric Hauser, DO  Active   JARDIANCE 25 MG TABS tablet [id number]  TAKE 1 TABLET BY  MOUTH EVERY DAY Karamalegos, [name], DO  Consider Medication Status and Discontinue (Discontinued by provider)   losartan (COZAAR) 50 MG tablet [id number] Yes Take 50 mg by mouth daily. [provider]  Active   metFORMIN (GLUCOPHAGE-XR) 500 MG 24 hr tablet [id number] Yes Take 2 tablets (1,000 mg total) by mouth 2 (two) times daily with a meal. [name], [name], DO  Active   Multiple Vitamin (MULTIVITAMIN) capsule [id number] Yes Take 1 capsule by mouth daily. [provider]  Active   Omega-3 Fatty Acids (FISH OIL) 1000 MG CAPS [id number] Yes Take by mouth in the morning and at bedtime. [provider]  Active   OZEMPIC, 0.25 OR 0.5 MG/DOSE, 2 MG/1.5ML SOPN [id number] Yes Inject 0.5 mg into the skin once a week. Eric Hauser, DO  Active   pioglitazone (ACTOS) 15 MG tablet [id number] Yes Take 1 tablet (15 mg total) by mouth daily. Eric Hauser, DO  Active   tamsulosin (FLOMAX) 0.4 MG CAPS capsule [id number] Yes TAKE ONE CAPSULE BY MOUTH EVERY NIGHT AT BEDTIME FOR URINE RETENTION Eric Hauser, DO  Active   traZODone (DESYREL) 100 MG tablet [id number] Yes TAKE 1 TABLET BY MOUTH AT BEDTIME Karamalegos, [name], DO  Active   venlafaxine XR (EFFEXOR-XR) 150 MG 24 hr capsule [id number] Yes Take 150 mg by mouth daily with breakfast. [provider]  Active           Patient Active Problem List   Diagnosis Date Noted  . Morbid obesity with BMI of 40.0-44.9, adult ([place]) 06/26/2020  . Major depressive disorder, recurrent, moderate ([place]) 06/25/2020  . GAD (generalized anxiety disorder) 06/25/2020  . Type 2 diabetes mellitus with other specified complication ([place]) 16/06/3709  . Chronic pain of right knee 05/27/2020  . Primary osteoarthritis involving multiple joints 05/27/2020  . Cataract associated with type 2 diabetes mellitus ([place]) 05/27/2020  . History of torn meniscus of left knee 05/27/2020  . DDD (degenerative disc  disease), lumbar 05/27/2020    Conditions to be addressed/monitored: HLD and DMII  Care Plan : PharmD - Disease Management T2DM, osteoarthritis  Updates made by Vella Raring, [place] since 01/29/2021 12:00 AM    Problem: Disease Progression     Long-Range Goal: Disease Progression Prevented or Minimized   Start Date: 11/06/2020  Expected End Date: 01/05/2021  Recent Progress: On track  Priority: High  Note:   Current Barriers:  . Unable to independently afford treatment regimen, particularly when enters coverage gap of [employer] Part D plan  Pharmacist Clinical Goal(s):  Marland Kitchen Over the next 60 days, patient will verbalize ability to afford treatment regimen. through collaboration with PharmD and provider.   Interventions: . Inter-disciplinary care team collaboration (see longitudinal plan of care) . Reports has almost completed 14 day course of Nystatin and symptoms have resolved,  o Note this second course of Nystatin prescribed by PCP for  mouth pain on 2/9.  o Patient confirms using as directed and will complete course. Remind patient to let office know if symptoms return.  Medication Assistance: . Reports received AZ&Me patient assistance application for Farxiga in mail, completed application and mailed this back to [facility] CPhT on 2/21 . Will continue to collaborate with [facility] CPhT as she assists patient with AZ&Me and Novo Nordisk patient assistance applications  Type 2 Diabetes  . Current treatment ? Ozempic 0.5 mg weekly ? Toujeo 60 units daily in evening  ? Note last instruction given to patient on 2/15, per PCP: may further increase dose by 1-2 units every 3 days if fasting blood sugars consistently >150 mg/dL ? Humalog - Reports taking three times daily with meals following sliding scale from previous Endocrinologist in [place] ? Has reported sliding scale based on blood sugar prior to each meal: ? If less than 140: takes 13 units ? If 140-200: takes 14  units ? If 200-250: takes 15 units ? If 250 or above: takes 16 units ? Metformin ER 500 mg - 2 tablets (1000 mg) twice daily ? Pioglitazone 15 mg once daily ? Jardiance 25 mg daily (restarted 2 weeks ago) . Reports home blood sugars ranging: 150s-206 ? Reports higher readings have occurred in the evenings and attributes elevations to eating more during day when has had anxiety related to family stress ? Denies recent low blood sugars and have counseled patient on s/s of low blood sugar and how to treat lows  Medication Adherence . Note patient uses weekly pillbox to manage his medications  Patient Goals/Self-Care Activities . Over the next 60 days, patient will:  - collaborate with provider on medication access solutions - patient to call office for new or worsening medical concerns - attend medical appointments as scheduled  Next appointment with PCP on 3/24  Follow Up Plan: Telephone follow up appointment with care management team member scheduled for: 3/18 at 10 am    Follow Up:  Patient agrees to Care Plan and Follow-up.  Harlow Asa, PharmD, [place] [phone]

## 2021-01-30 ENCOUNTER — Telehealth: Payer: Self-pay

## 2021-01-30 NOTE — Telephone Encounter (Signed)
Copied from [place] [id number]. Topic: Appointment Scheduling - Scheduling Inquiry for Clinic >> Jan 30, 2021 11:27 AM Erick Blinks wrote: Reason for CRM: Pt called to schedule labs, per PCP he says. Currently no active labs in Epic. Please advise   Best contact: [phone]   I left a message for the patient letting him know that I don't seen anything in his chart that states he is in needs of future labs. The last note that was documented yesterday was to f/u with Dr. Raliegh Ip on March 24. I assume at that visit, it will be decided to draw labs.

## 2021-02-07 DIAGNOSIS — G4733 Obstructive sleep apnea (adult) (pediatric): Secondary | ICD-10-CM | POA: Diagnosis not present

## 2021-02-08 ENCOUNTER — Ambulatory Visit: Payer: Medicare Other

## 2021-02-13 ENCOUNTER — Other Ambulatory Visit: Payer: Self-pay

## 2021-02-13 ENCOUNTER — Ambulatory Visit
Admission: RE | Admit: 2021-02-13 | Discharge: 2021-02-13 | Disposition: A | Discharge status: Home / Self Care | Payer: Medicare Other | Source: Ambulatory Visit | Attending: Neurology | Admitting: Neurology

## 2021-02-13 DIAGNOSIS — M5416 Radiculopathy, lumbar region: Secondary | ICD-10-CM | POA: Diagnosis not present

## 2021-02-13 DIAGNOSIS — M545 Low back pain, unspecified: Secondary | ICD-10-CM | POA: Diagnosis not present

## 2021-02-16 ENCOUNTER — Other Ambulatory Visit: Payer: Self-pay | Admitting: Family Medicine

## 2021-02-16 DIAGNOSIS — F411 Generalized anxiety disorder: Secondary | ICD-10-CM

## 2021-02-16 NOTE — Telephone Encounter (Signed)
Requested medication (s) are due for refill today: yes  Requested medication (s) are on the active medication list: yes  Last refill:  01/18/21  Future visit scheduled: yes  02/20/21  Notes to clinic:  courtesy RF already given. Please advise.   Requested Prescriptions  Pending Prescriptions Disp Refills   busPIRone (BUSPAR) 5 MG tablet [Pharmacy Med Name: BUSPIRONE 5MG  TABLETS] 60 tablet 0    Sig: TAKE 1 TABLET(5 MG) BY MOUTH TWICE DAILY AS NEEDED FOR ANXIETY      Psychiatry: Anxiolytics/Hypnotics - Non-controlled Passed - 02/16/2021 12:20 PM      Passed - Valid encounter within last 6 months    Recent Outpatient Visits           4 months ago Type 2 diabetes mellitus with other specified complication, with long-term current use of insulin [facility])   [place], DO   5 months ago Dyspnea on exertion   [place], DO   7 months ago Type 2 diabetes mellitus with other specified complication, with long-term current use of insulin [facility])   [place], DO   8 months ago Type 2 diabetes mellitus with other specified complication, with long-term current use of insulin ([place])   [facility] [name], [name], DO       Future Appointments             In 1 week [name], [name], DO [facility], [facility]

## 2021-02-21 ENCOUNTER — Ambulatory Visit (INDEPENDENT_AMBULATORY_CARE_PROVIDER_SITE_OTHER): Payer: Medicare Other | Admitting: Pharmacist

## 2021-02-21 DIAGNOSIS — Z794 Long term (current) use of insulin: Secondary | ICD-10-CM | POA: Diagnosis not present

## 2021-02-21 DIAGNOSIS — E1169 Type 2 diabetes mellitus with other specified complication: Secondary | ICD-10-CM

## 2021-02-21 NOTE — Patient Instructions (Signed)
Visit Information  PATIENT GOALS: Goals Addressed            This Visit's Progress   . Pharmacy Goals       Our goal A1c is less than 7%. This corresponds with fasting sugars less than 130 and 2 hour after meal sugars less than 180. Please check your blood sugar and keep a log of the results  Please check your home blood pressure, keep a log of the results and bring this with you to your medical appointments.  Our goal bad cholesterol, or LDL, is less than 70 . This is why it is important to continue taking your atorvastatin.  Feel free to call me with any questions or concerns. I look forward to our next call!   Harlow Asa, PharmD, [place] [phone]       The patient verbalized understanding of instructions, educational materials, and care plan provided today and declined offer to receive copy of patient instructions, educational materials, and care plan.   CM Pharmacist will reach out to the patient again in December  Kamonte Mcmichen, PharmD, [facility] [phone]

## 2021-02-21 NOTE — Chronic Care Management (AMB) (Signed)
Chronic Care Management Pharmacy Note  02/21/2021 Name:  Eric Andrews MRN:  [id number] DOB:  10/08/1951  Subjective: Eric Andrews is an 70 y.o. year old male who is a primary patient of [name], DO.  The CCM team was consulted for assistance with disease management and care coordination needs.    Engaged with patient by telephone for follow up visit in response to provider referral for pharmacy case management and/or care coordination services.   Consent to Services:  The patient was given information about Chronic Care Management services, agreed to services, and gave verbal consent prior to initiation of services.  Please see initial visit note for detailed documentation.   Patient Care Team: [name], DO as PCP - General (Family Medicine) [place], Virl Diamond, [facility] (Pharmacist)  Recent office visits: none  Hospital visits: None in previous 6 months  Objective:  Lab Results  Component Value Date   CREATININE 1.08 06/18/2020   CREATININE 1.1 12/19/2019    Lab Results  Component Value Date   HGBA1C 7.4 (A) 10/10/2020   Last diabetic Eye exam:  Lab Results  Component Value Date/Time   HMDIABEYEEXA No Retinopathy 07/16/2020 12:00 AM    Social History   Tobacco Use  Smoking Status Never Smoker  Smokeless Tobacco Never Used   BP Readings from Last 3 Encounters:  12/29/20 (!) 150/84  12/11/20 (!) 151/87  10/10/20 137/80   Pulse Readings from Last 3 Encounters:  12/29/20 96  12/11/20 76  10/10/20 79   Wt Readings from Last 3 Encounters:  12/29/20 (!) 307 lb (139.3 kg)  12/11/20 (!) 306 lb 10.6 oz (139.1 kg)  10/10/20 (!) 306 lb 9.6 oz (139.1 kg)    Assessment: Review of patient past medical history, allergies, medications, health status, including review of consultants reports, laboratory and other test data, was performed as part of comprehensive evaluation and provision of chronic care management services.   SDOH:  (Social  Determinants of Health) assessments and interventions performed: none   CCM Care Plan  Allergies  Allergen Reactions  . Dust Mite Extract     Medications Reviewed Today    Reviewed by Vella Raring, RPH (Pharmacist) on 02/21/21 at 1402  Med List Status: <None>  Medication Order Taking? Sig Documenting Provider Last Dose Status Informant  aspirin 81 MG EC tablet [id number]  Take 81 mg by mouth daily. [provider]  Active   atorvastatin (LIPITOR) 20 MG tablet [id number]  TAKE 1 TABLET BY MOUTH AT BEDTIME [name], DO  Active   busPIRone (BUSPAR) 5 MG tablet [id number]  TAKE 1 TABLET(5 MG) BY MOUTH TWICE DAILY AS NEEDED FOR ANXIETY [name], [name], DO  Active   celecoxib (CELEBREX) 200 MG capsule [id number]  Take 200 mg by mouth 2 (two) times daily. [provider]  Active   cetirizine (ZYRTEC) 10 MG tablet [id number]  Take 10 mg by mouth at bedtime. [provider]  Active   chlorthalidone (HYGROTON) 25 MG tablet [id number]  Take 1 tablet (25 mg total) by mouth daily. Karamalegos, [name], DO  Active   cholecalciferol (VITAMIN D3) 25 MCG (1000 UNIT) tablet [id number]  Take 1,000 Units by mouth daily. [provider]  Active   FARXIGA 10 MG TABS tablet [id number] No Take 1 tablet (10 mg total) by mouth daily before breakfast.  Patient not taking: Reported on 02/21/2021   [name], DO Not Taking Active   fenofibrate micronized (LOFIBRA) 134 MG capsule  [id number]  Take by mouth. [provider]  Active   finasteride (PROSCAR) 5 MG tablet [id number]  Take 1 tablet (5 mg total) by mouth every evening. Karamalegos, [name], DO  Active   gabapentin (NEURONTIN) 100 MG capsule [id number]  Take 2 capsules by mouth 2 (two) times daily. [provider]  Active   insulin glargine, 1 Unit Dial, (TOUJEO SOLOSTAR) 300 UNIT/ML Solostar Pen [id number] Yes Inject 54 Units into the skin every evening.  Patient  taking differently: Inject 66 Units into the skin every evening.   [name], DO Taking Active   insulin lispro (HUMALOG) 100 UNIT/ML injection [id number] Yes Inject 13 Units into the skin 2 (two) times daily with a meal. [provider] Taking Active            Med Note Winfield Cunas, ELISABETH A   Fri Nov 15, 2020 11:13 AM) Reports taking according to sliding scale from previous Endocrinologist in [place]  Insulin Pen Needle 31G X 5 MM MISC [id number]  Use with insulin to inject into skin 3 times daily as directed [name], DO  Active   losartan (COZAAR) 50 MG tablet [id number]  Take 50 mg by mouth daily. [provider]  Active   metFORMIN (GLUCOPHAGE-XR) 500 MG 24 hr tablet [id number] Yes Take 2 tablets (1,000 mg total) by mouth 2 (two) times daily with a meal. [name], [name], DO Taking Active   Multiple Vitamin (MULTIVITAMIN) capsule [id number]  Take 1 capsule by mouth daily. [provider]  Active   nystatin (MYCOSTATIN) 100000 UNIT/ML suspension [id number]  Swish and retain 5 ml in mouth as long as possible QID x 7-14 days [name], DO  Active   Omega-3 Fatty Acids (FISH OIL) 1000 MG CAPS [id number]  Take by mouth in the morning and at bedtime. [provider]  Active   OZEMPIC, 0.25 OR 0.5 MG/DOSE, 2 MG/1.5ML SOPN [id number] No Inject 0.5 mg into the skin once a week.  Patient not taking: Reported on 02/21/2021   [name], DO Not Taking Active   pioglitazone (ACTOS) 15 MG tablet [id number] Yes Take 1 tablet (15 mg total) by mouth daily. [name], DO Taking Active   tamsulosin (FLOMAX) 0.4 MG CAPS capsule [id number]  TAKE ONE CAPSULE BY MOUTH EVERY NIGHT AT BEDTIME FOR URINE RETENTION [name], DO  Active   traZODone (DESYREL) 100 MG tablet [id number]  TAKE 1 TABLET BY MOUTH AT BEDTIME Karamalegos, [name], DO  Active   venlafaxine XR (EFFEXOR-XR) 150 MG 24 hr  capsule [id number]  Take 150 mg by mouth daily with breakfast. [provider]  Active           Patient Active Problem List   Diagnosis Date Noted  . Morbid obesity with BMI of 40.0-44.9, adult ([place]) 06/26/2020  . Major depressive disorder, recurrent, moderate ([place]) 06/25/2020  . GAD (generalized anxiety disorder) 06/25/2020  . Type 2 diabetes mellitus with other specified complication ([place]) 92/42/6834  . Chronic pain of right knee 05/27/2020  . Primary osteoarthritis involving multiple joints 05/27/2020  . Cataract associated with type 2 diabetes mellitus ([place]) 05/27/2020  . History of torn meniscus of left knee 05/27/2020  . DDD (degenerative disc disease), lumbar 05/27/2020    Immunization History  Administered Date(s) Administered  . Fluad Quad(high Dose 65+) 10/10/2020  . Moderna Sars-Covid-2 Vaccination 02/09/2020, 03/13/2020  . Tdap 01/16/2018    Conditions to be addressed/monitored: DMII  Care Plan :  PharmD - Disease Management T2DM, osteoarthritis  Updates made by Vella Raring, [place] since 02/21/2021 12:00 AM    Problem: Disease Progression     Long-Range Goal: Disease Progression Prevented or Minimized   Start Date: 11/06/2020  Expected End Date: 01/05/2021  Recent Progress: On track  Priority: High  Note:   Current Barriers:  . Unable to independently afford treatment regimen, particularly when enters coverage gap of [employer] Part D plan o Patient APPROVED for patient assistance from [employer] for [place], [place] through 12/06/2021 o Patient APPROVED for patient assistance from AZ&Me for [place] through 12/06/2021  Pharmacist Clinical Goal(s):  Marland Kitchen Over the next 60 days, patient will verbalize ability to afford treatment regimen through collaboration with PharmD and provider.   Interventions: . 1:1 collaboration with [name], DO regarding development and update of comprehensive plan of care as  evidenced by provider attestation and co-signature . Inter-disciplinary care team collaboration (see longitudinal plan of care)  Type 2 Diabetes  . Current treatment ? Toujeo 66 units daily in evening  ? Note last instruction given to patient on 2/15, per PCP: may further increase dose by 1-2 units every 3 days if fasting blood sugars consistently >150 mg/dL ? Humalog - Reports taking three times daily with meals following sliding scale from previous Endocrinologist in [place] ? Has reported sliding scale based on blood sugar prior to each meal: ? If less than 140: takes 13 units ? If 140-200: takes 14 units ? If 200-250: takes 15 units ? If 250 or above: takes 16 units ? Metformin ER 500 mg - 2 tablets (1000 mg) twice daily ? Pioglitazone 15 mg once daily ? Reports has not been taking Ozempic or Jardiance for ~3 weeks as ran out of medication . Reports morning home blood sugars ranging: 150s-170s ? Reports has been more careful of what he eats since out of Ozempic and Jardiance and attributes current blood sugar control to better dietary choices ? Denies recent low blood sugars and have counseled patient on s/s of low blood sugar and how to treat lows  Medication Assistance: . Received message from [place] letting me know that patient has been approved for patient assistance from [employer] for [place], [place]. Reports that she confirmed these 3 medications arrived at clinic, but when notified patient of arrival, patient stated had questions about how to take each and would plan to wait until upcoming appointment with PCP on 3/24 to pick up. . From review of [facility] CPhT documentation, note patient also approved to receive Farxiga through AZ&Me assistance program 02/13/2021-12/06/2021, medication to be shipped to patient's home. o Note patient's assistance application for Jardiance denied this calendar year due to income limit.  . Follow up with patient regarding patient  assistance program medications. o Patient verbalizes understanding of plan to receive Farxiga from patient assistance program as therapeutic alternative to Jardiance o Patient again verbalizes understanding that Novolog is a therapeutic alternative to Humalog and that Tyler Aas is a therapeutic alternative to [employer], as available from [employer] . Patient states that he may pick up patient assistance medications over next week or wait until appointment with PCP. States he will monitor blood sugar closely. Encourage patient to follow up with PCP regarding restart of medications. o Counsel patient on importance of guidance from provider for adjustment of medications; patient expresses preference for more self-management of his blood sugar control. o Will collaborate with PCP . Patient denies need  for scheduled follow up with CM Pharmacist at this time  Medication Adherence . Note patient uses weekly pillbox to manage his medications  Patient Goals/Self-Care Activities . Over the next 60 days, patient will:  - collaborate with provider on medication access solutions - patient to call office for new or worsening medical concerns - attend medical appointments as scheduled  Next appointment with PCP on 3/24  Follow Up Plan: Telephone follow up appointment with care management team member scheduled for: December for medication assistance follow up     Medication Assistance: Ozempic, Novolog and [place] obtained through [place] obtained through AZ&Me medication assistance program.  Enrollment ends 12/06/2021  Patient's preferred pharmacy is:  [facility] [place], [address] AT [address] [place] [address] Phone: [phone] Fax: [phone]  [facility], [address] AT [address] [address] [place] [address] Phone: [phone] Fax:  [phone]  Uses pill box? Yes   Follow Up:  Patient requests no follow-up at this time.  Plan:   1) The patient has been provided with contact information for CM Pharmacist and has been advised to call with any medication related questions or concerns.   2) CM Pharmacist will reach out to the patient again in December to follow up regarding medication assistance.  Harlow Asa, PharmD, [place] [phone]

## 2021-02-27 ENCOUNTER — Ambulatory Visit (INDEPENDENT_AMBULATORY_CARE_PROVIDER_SITE_OTHER): Payer: Medicare Other | Admitting: Family Medicine

## 2021-02-27 ENCOUNTER — Other Ambulatory Visit: Payer: Self-pay

## 2021-02-27 ENCOUNTER — Other Ambulatory Visit: Payer: Self-pay | Admitting: Family Medicine

## 2021-02-27 ENCOUNTER — Encounter: Payer: Self-pay | Admitting: Family Medicine

## 2021-02-27 VITALS — BP 127/70 | HR 72 | Temp 97.7°F | Ht 71.0 in | Wt 320.8 lb

## 2021-02-27 DIAGNOSIS — J3089 Other allergic rhinitis: Secondary | ICD-10-CM | POA: Diagnosis not present

## 2021-02-27 DIAGNOSIS — R351 Nocturia: Secondary | ICD-10-CM

## 2021-02-27 DIAGNOSIS — I10 Essential (primary) hypertension: Secondary | ICD-10-CM | POA: Diagnosis not present

## 2021-02-27 DIAGNOSIS — F331 Major depressive disorder, recurrent, moderate: Secondary | ICD-10-CM

## 2021-02-27 DIAGNOSIS — M8949 Other hypertrophic osteoarthropathy, multiple sites: Secondary | ICD-10-CM | POA: Diagnosis not present

## 2021-02-27 DIAGNOSIS — D229 Melanocytic nevi, unspecified: Secondary | ICD-10-CM | POA: Diagnosis not present

## 2021-02-27 DIAGNOSIS — E1169 Type 2 diabetes mellitus with other specified complication: Secondary | ICD-10-CM | POA: Diagnosis not present

## 2021-02-27 DIAGNOSIS — F411 Generalized anxiety disorder: Secondary | ICD-10-CM

## 2021-02-27 DIAGNOSIS — M5136 Other intervertebral disc degeneration, lumbar region: Secondary | ICD-10-CM

## 2021-02-27 DIAGNOSIS — L989 Disorder of the skin and subcutaneous tissue, unspecified: Secondary | ICD-10-CM

## 2021-02-27 DIAGNOSIS — E785 Hyperlipidemia, unspecified: Secondary | ICD-10-CM

## 2021-02-27 DIAGNOSIS — M159 Polyosteoarthritis, unspecified: Secondary | ICD-10-CM

## 2021-02-27 DIAGNOSIS — Z0000 Encounter for general adult medical examination without abnormal findings: Secondary | ICD-10-CM

## 2021-02-27 DIAGNOSIS — Z6841 Body Mass Index (BMI) 40.0 and over, adult: Secondary | ICD-10-CM

## 2021-02-27 DIAGNOSIS — Z794 Long term (current) use of insulin: Secondary | ICD-10-CM

## 2021-02-27 LAB — POCT GLYCOSYLATED HEMOGLOBIN (HGB A1C): Hemoglobin A1C: 8.8 % — AB (ref 4.0–5.6)

## 2021-02-27 MED ORDER — LOSARTAN POTASSIUM 50 MG PO TABS: 50.0000 mg | ORAL_TABLET | Freq: Every day | ORAL | 3 refills | Status: DC

## 2021-02-27 MED ORDER — FENOFIBRATE MICRONIZED 134 MG PO CAPS: 134.0000 mg | ORAL_CAPSULE | Freq: Every day | ORAL | 3 refills | Status: DC

## 2021-02-27 MED ORDER — BUSPIRONE HCL 10 MG PO TABS
10.0000 mg | ORAL_TABLET | Freq: Two times a day (BID) | ORAL | 1 refills | Status: DC
Start: 2021-02-27 — End: 2022-04-24

## 2021-02-27 MED ORDER — TRAZODONE HCL 100 MG PO TABS: 100.0000 mg | ORAL_TABLET | Freq: Every day | ORAL | 3 refills | Status: DC

## 2021-02-27 MED ORDER — CELECOXIB 200 MG PO CAPS
200.0000 mg | ORAL_CAPSULE | Freq: Two times a day (BID) | ORAL | 1 refills | Status: DC
Start: 2021-02-27 — End: 2022-01-01

## 2021-02-27 MED ORDER — CETIRIZINE HCL 10 MG PO TABS: 10.0000 mg | ORAL_TABLET | Freq: Every day | ORAL | 3 refills | Status: DC

## 2021-02-27 NOTE — Patient Instructions (Addendum)
Thank you for coming to the office today.  [place]   [place], [place] [address] Hours: 8AM-5PM Phone: [phone]  Refilled Celecoxib, Cetirizine  Try to reduce Chlorthalidone 25mg , fluid and BP pill, can try cutting in half for now to see if works fine, then if doing well can trial off of it, if swelling and BP elevated, then may need to restart it. Let me know.  Recent Labs    06/18/20 0857 10/10/20 1111 02/27/21 0954  HGBA1C 7.9* 7.4* 8.8*   When restart Ozempic, can dial back [place] to 50 unit and then adjust based on fasting sugar reading in AM - if consistently < 150 reduce by about 2 units per 3 days or more.  DUE for FASTING BLOOD WORK (no food or drink after midnight before the lab appointment, only water or coffee without cream/sugar on the morning of)  SCHEDULE "Lab Only" visit in the morning at the clinic for lab draw in 4 MONTHS   - Make sure Lab Only appointment is at about 1 week before your next appointment, so that results will be available  For Lab Results, once available within 2-3 days of blood draw, you can can log in to MyChart online to view your results and a brief explanation. Also, we can discuss results at next follow-up visit.    Please schedule a Follow-up Appointment to: Return in about 4 months (around 06/29/2021) for 4 month fasting lab only then 1 week later Annual Physical.  If you have any other questions or concerns, please feel free to call the office or send a message through [place]. You may also schedule an earlier appointment if necessary.  Additionally, you may be receiving a survey about your experience at our office within a few days to 1 week by e-mail or mail. We value your feedback.  Nobie Putnam, DO [place]

## 2021-02-27 NOTE — Progress Notes (Signed)
Subjective:    Patient ID: Eric Andrews, male    DOB: 1951/01/27, 70 y.o.   MRN: [id number]  Eric Andrews is a 70 y.o. male presenting on 02/27/2021 for Diabetes (Pt has a list of medications that would like to be newly prescribed. He also would like to know if Buspirone medication can increase the dosage.)   HPI   Major Depression, recurrent moderate Generalized Anxiety Disorder Oldest daughter recently passed after terminal cancer. He has been coping and managing this for some time now, he is optimistic on improving his mental health moving forward. Continues on Buspar 5mg  BID, feels less effective On Venlafaxine 150mg  daily  CHRONIC DM, Type2 Out of Farxiga and Ozempic for 1 month waiting on meds from [place] / financial assistance. Medications available here at office for pick up admits overeatingat times. Stress eeating Meds:Humalog TIDSSI with meals,Toujeo 54units daily, Jardiance 25mg , Pioglitazone 15mg  - On ozempic 0.5mg  weekly out now Reports good compliance. Tolerating well w/o side-effects Currently on ARB Denies hypoglycemia, polyuria, visual changes, numbness or tingling  BPH LUTS Improved on Tamsulosin    Depression screen [facility] 2/9 02/27/2021 10/10/2020 09/02/2020  Decreased Interest 1 1 3   Down, Depressed, Hopeless 1 1 3   PHQ - 2 Score 2 2 6   Altered sleeping 2 2 3   Tired, decreased energy 2 2 3   Change in appetite 3 3 3   Feeling bad or failure about yourself  2 2 3   Trouble concentrating 1 1 2   Moving slowly or fidgety/restless 1 2 2   Suicidal thoughts 1 1 1   PHQ-9 Score 14 15 23   Difficult doing work/chores Somewhat difficult Somewhat difficult Somewhat difficult   GAD 7 : Generalized Anxiety Score 10/10/2020 09/02/2020 06/25/2020  Nervous, Anxious, on Edge 3 3 2   Control/stop worrying 2 3 3   Worry too much - different things 2 3 3   Trouble relaxing 3 3 3   Restless 1 1 1   Easily annoyed or irritable 3 3 1   Afraid - awful might happen 2 2 2    Total GAD 7 Score 16 18 15   Anxiety Difficulty Somewhat difficult Very difficult Somewhat difficult      Social History   Tobacco Use  . Smoking status: Never Smoker  . Smokeless tobacco: Never Used  Vaping Use  . Vaping Use: Never used  Substance Use Topics  . Alcohol use: Not Currently  . Drug use: Never    Review of Systems Per HPI unless specifically indicated above     Objective:    BP 127/70 (BP Location: Left Arm, Patient Position: Sitting, Cuff Size: Large)   Pulse 72   Temp 97.7 F (36.5 C) (Temporal)   Ht 5\' 11"  (1.803 m)   Wt (!) 320 lb 12.8 oz (145.5 kg)   SpO2 98%   BMI 44.74 kg/m   Wt Readings from Last 3 Encounters:  02/27/21 (!) 320 lb 12.8 oz (145.5 kg)  12/29/20 (!) 307 lb (139.3 kg)  12/11/20 (!) 306 lb 10.6 oz (139.1 kg)    Physical Exam Vitals and nursing note reviewed.  Constitutional:      General: He is not in acute distress.    Appearance: He is well-developed. He is obese. He is not diaphoretic.     Comments: Well-appearing, comfortable, cooperative  HENT:     Head: Normocephalic and atraumatic.  Eyes:     General:        Right eye: No discharge.        Left eye: No  discharge.     Conjunctiva/sclera: Conjunctivae normal.  Neck:     Thyroid: No thyromegaly.  Cardiovascular:     Rate and Rhythm: Normal rate and regular rhythm.     Heart sounds: Normal heart sounds. No murmur heard.   Pulmonary:     Effort: Pulmonary effort is normal. No respiratory distress.     Breath sounds: Normal breath sounds. No wheezing or rales.  Musculoskeletal:        General: Normal range of motion.     Cervical back: Normal range of motion and neck supple.  Lymphadenopathy:     Cervical: No cervical adenopathy.  Skin:    General: Skin is warm and dry.     Findings: No erythema or rash.  Neurological:     Mental Status: He is alert and oriented to person, place, and time.  Psychiatric:        Behavior: Behavior normal.     Comments: Well  groomed, good eye contact, normal speech and thoughts      Recent Labs    06/18/20 0857 10/10/20 1111 02/27/21 0954  HGBA1C 7.9* 7.4* 8.8*     Results for orders placed or performed in visit on 02/27/21  POCT HgB A1C  Result Value Ref Range   Hemoglobin A1C 8.8 (A) 4.0 - 5.6 %      Assessment & Plan:   Problem List Items Addressed This Visit    Type 2 diabetes mellitus with other specified complication ([place]) - Primary   Relevant Medications   fenofibrate micronized (LOFIBRA) 134 MG capsule   losartan (COZAAR) 50 MG tablet   Other Relevant Orders   POCT HgB A1C (Completed)   Primary osteoarthritis involving multiple joints   Relevant Medications   celecoxib (CELEBREX) 200 MG capsule   Morbid obesity with BMI of 40.0-44.9, adult (HCC)   Major depressive disorder, recurrent, moderate (HCC)   Relevant Medications   busPIRone (BUSPAR) 10 MG tablet   traZODone (DESYREL) 100 MG tablet   Hyperlipidemia associated with type 2 diabetes mellitus (HCC)   Relevant Medications   fenofibrate micronized (LOFIBRA) 134 MG capsule   losartan (COZAAR) 50 MG tablet   GAD (generalized anxiety disorder)   Relevant Medications   busPIRone (BUSPAR) 10 MG tablet   traZODone (DESYREL) 100 MG tablet   Environmental and seasonal allergies   Relevant Medications   cetirizine (ZYRTEC) 10 MG tablet   DDD (degenerative disc disease), lumbar   Relevant Medications   celecoxib (CELEBREX) 200 MG capsule    Other Visit Diagnoses    Essential hypertension       Relevant Medications   fenofibrate micronized (LOFIBRA) 134 MG capsule   losartan (COZAAR) 50 MG tablet   Multiple atypical nevi       Relevant Orders   Ambulatory referral to Dermatology   Non-healing skin lesion       Relevant Orders   Ambulatory referral to Dermatology      #HTN Refill medication, continue Losartan Try to reduce Chlorthalidone 25mg , fluid and BP pill, can try cutting in half for now to see if works fine, then if  doing well can trial off of it, if swelling and BP elevated, then may need to restart it  #T2DM A1c mild elevated 8.8 Off medications for 1 month awaiting shipment, now has meds for tresiba and ozempic Waiting on farxiga  #HLD Continue Statin / Fibrate Refill  #GAD / Depression recurrent Chronic problem Continue current venlafaxine, discussed limited room for dose increase Will  increase Buspar from 5 BID to 10mg  BID adjust further if indicated.  #Skin lesions Multiple pigmented non healing skin lesions upper body extremities Also possible acanthosis of R nipple Refer to Dermatology  Orders Placed This Encounter  Procedures  . Ambulatory referral to Dermatology    Referral Priority:   Routine    Referral Type:   Consultation    Referral Reason:   Specialty Services Required    Requested Specialty:   Dermatology    Number of Visits Requested:   1  . POCT HgB A1C    Meds ordered this encounter  Medications  . busPIRone (BUSPAR) 10 MG tablet    Sig: Take 1 tablet (10 mg total) by mouth 2 (two) times daily.    Dispense:  180 tablet    Refill:  1    Dose increase from 5 to 10  . celecoxib (CELEBREX) 200 MG capsule    Sig: Take 1 capsule (200 mg total) by mouth 2 (two) times daily.    Dispense:  180 capsule    Refill:  1  . cetirizine (ZYRTEC) 10 MG tablet    Sig: Take 1 tablet (10 mg total) by mouth at bedtime.    Dispense:  90 tablet    Refill:  3  . fenofibrate micronized (LOFIBRA) 134 MG capsule    Sig: Take 1 capsule (134 mg total) by mouth daily before breakfast.    Dispense:  90 capsule    Refill:  3  . losartan (COZAAR) 50 MG tablet    Sig: Take 1 tablet (50 mg total) by mouth daily.    Dispense:  90 tablet    Refill:  3  . traZODone (DESYREL) 100 MG tablet    Sig: Take 1 tablet (100 mg total) by mouth at bedtime.    Dispense:  90 tablet    Refill:  3      Follow up plan: Return in about 4 months (around 06/29/2021) for 4 month fasting lab only then 1  week later Annual Physical.  Future labs ordered for 07/01/21   Nobie Putnam, [facility] 02/27/2021, 9:52 AM

## 2021-03-04 ENCOUNTER — Telehealth: Payer: Self-pay

## 2021-03-04 DIAGNOSIS — E1169 Type 2 diabetes mellitus with other specified complication: Secondary | ICD-10-CM

## 2021-03-04 DIAGNOSIS — Z794 Long term (current) use of insulin: Secondary | ICD-10-CM

## 2021-03-04 MED ORDER — INSULIN SYRINGE-NEEDLE U-100 25G X 1" 1 ML MISC
11 refills | Status: DC
Start: 2021-03-04 — End: 2021-08-01

## 2021-03-10 ENCOUNTER — Telehealth: Payer: Self-pay | Admitting: Pharmacist

## 2021-03-10 ENCOUNTER — Ambulatory Visit: Payer: Self-pay | Admitting: Pharmacist

## 2021-03-10 ENCOUNTER — Ambulatory Visit: Payer: Self-pay | Admitting: *Deleted

## 2021-03-10 DIAGNOSIS — G4733 Obstructive sleep apnea (adult) (pediatric): Secondary | ICD-10-CM | POA: Diagnosis not present

## 2021-03-10 DIAGNOSIS — Z794 Long term (current) use of insulin: Secondary | ICD-10-CM

## 2021-03-10 DIAGNOSIS — E1169 Type 2 diabetes mellitus with other specified complication: Secondary | ICD-10-CM

## 2021-03-10 NOTE — Telephone Encounter (Signed)
  I returned pt's call.  He was needing to know the ratio from transitioning from pen to an insulin syringe.  I let him know there were no providers in [facility] today.   I suggested he call his pharmacist.   He was agreeable to this plan and thanked me for my help  I sent my notes to [facility] for Dr. [name] to have upon his return.     Reason for Disposition . [1] Caller has NON-URGENT medicine question about med that PCP prescribed AND [2] triager unable to answer question  Answer Assessment - Initial Assessment Questions 1. NAME of MEDICATION: "What medicine are you calling about?"     Transitioning from pen to insulin syringe.   What is the ratio I went from Novolog to Humalog.   2. QUESTION: "What is your question?" (e.g., medication refill, side effect)     He wants to know what the ratio is from transitioning from a pen to an insulin syringe.   3. PRESCRIBING HCP: "Who prescribed it?" Reason: if prescribed by specialist, call should be referred to that group.     Dr. Charlaine Dalton 4. SYMPTOMS: "Do you have any symptoms?"     No 5. SEVERITY: If symptoms are present, ask "Are they mild, moderate or severe?"     N/A 6. PREGNANCY:  "Is there any chance that you are pregnant?" "When was your last menstrual period?"     N/A  Protocols used: MEDICATION QUESTION CALL-A-AH

## 2021-03-10 NOTE — Progress Notes (Signed)
This encounter was created in error - please disregard.

## 2021-03-10 NOTE — Addendum Note (Signed)
Addended by: Harlow Asa A on: 03/10/2021 02:01 PM   Modules accepted: Level of Service, SmartSet

## 2021-03-10 NOTE — Addendum Note (Signed)
Addended by: Harlow Asa A on: 03/10/2021 02:14 PM   Modules accepted: Level of Service, SmartSet

## 2021-03-10 NOTE — Addendum Note (Signed)
Addended by: Harlow Asa A on: 03/10/2021 01:57 PM   Modules accepted: Level of Service, SmartSet

## 2021-03-10 NOTE — Addendum Note (Signed)
Addended by: Harlow Asa A on: 03/10/2021 02:11 PM   Modules accepted: Miquel Dunn

## 2021-03-10 NOTE — Chronic Care Management (AMB) (Deleted)
Chronic Care Management Pharmacy Note  03/10/2021 Name:  Eric Andrews MRN:  [id number] DOB:  07-23-1951  Subjective: Eric Andrews is an 70 y.o. year old male who is a primary patient of [name], DO.  The CCM team was consulted for assistance with disease management and care coordination needs.    Receive voicemail from patient requesting call back  Engaged with patient by telephone for return call in response to provider referral for pharmacy case management and/or care coordination services.   Consent to Services:  The patient was given information about Chronic Care Management services, agreed to services, and gave verbal consent prior to initiation of services.  Please see initial visit note for detailed documentation.   Patient Care Team: [name], DO as PCP - General (Family Medicine) Attallah Ontko, Virl Diamond, RPH-CPP (Pharmacist)    CCM Care Plan  Allergies  Allergen Reactions  . Dust Mite Extract     Medications Reviewed Today    Reviewed by [name], DO (Physician) on 02/27/21 at [phone]  Med List Status: <None>  Medication Order Taking? Sig Documenting Provider Last Dose Status Informant  aspirin 81 MG EC tablet [id number] Yes Take 81 mg by mouth daily. [provider] Taking Active   atorvastatin (LIPITOR) 20 MG tablet [id number] Yes TAKE 1 TABLET BY MOUTH AT BEDTIME [name], DO Taking Active   busPIRone (BUSPAR) 5 MG tablet [id number] Yes TAKE 1 TABLET(5 MG) BY MOUTH TWICE DAILY AS NEEDED FOR ANXIETY [name], DO Taking Active   celecoxib (CELEBREX) 200 MG capsule [id number] Yes Take 200 mg by mouth 2 (two) times daily. [provider] Taking Active   cetirizine (ZYRTEC) 10 MG tablet [id number] Yes Take 10 mg by mouth at bedtime. [provider] Taking Active   chlorthalidone (HYGROTON) 25 MG tablet [id number] Yes Take 1 tablet (25 mg total) by mouth daily. [name], DO Taking Active   cholecalciferol (VITAMIN D3) 25 MCG (1000 UNIT) tablet [id number] Yes Take 1,000 Units by mouth daily. [provider] Taking Active   FARXIGA 10 MG TABS tablet [id number] No Take 1 tablet (10 mg total) by mouth daily before breakfast.  Patient not taking: No sig reported   [name], DO Not Taking Active   fenofibrate micronized (LOFIBRA) 134 MG capsule [id number] Yes Take by mouth. [provider] Taking Active   finasteride (PROSCAR) 5 MG tablet [id number] Yes Take 1 tablet (5 mg total) by mouth every evening. [name], DO Taking Active   gabapentin (NEURONTIN) 100 MG capsule [id number] Yes Take 2 capsules by mouth 2 (two) times daily. [provider] Taking Active   insulin glargine, 1 Unit Dial, (TOUJEO SOLOSTAR) 300 UNIT/ML Solostar Pen [id number] Yes Inject 54 Units into the skin every evening.  Patient taking differently: Inject 66 Units into the skin every evening.   [name], DO Taking Active   insulin lispro (HUMALOG) 100 UNIT/ML injection [id number] Yes Inject 13 Units into the skin 2 (two) times daily with a meal. [provider] Taking Active            Med Note Winfield Cunas, Ameli Sangiovanni A   Fri Nov 15, 2020 11:13 AM) Reports taking according to sliding scale from previous Endocrinologist in [place]  Insulin Pen Needle 31G X 5 MM MISC [id number] Yes Use with insulin to inject into skin 3 times daily as directed [name], DO Taking Active   losartan (COZAAR) 50 MG  tablet [id number] Yes Take 50 mg by mouth daily. [provider] Taking Active   metFORMIN (GLUCOPHAGE-XR) 500 MG 24 hr tablet [id number] Yes Take 2 tablets (1,000 mg total) by mouth 2 (two) times daily with a meal. [name], [name], DO Taking Active   Multiple Vitamin (MULTIVITAMIN) capsule [id number] Yes Take 1 capsule by mouth daily. [provider] Taking Active   nystatin (MYCOSTATIN)  100000 UNIT/ML suspension [id number] Yes Swish and retain 5 ml in mouth as long as possible QID x 7-14 days [name], DO Taking Active   Omega-3 Fatty Acids (FISH OIL) 1000 MG CAPS [id number] Yes Take by mouth in the morning and at bedtime. [provider] Taking Active   OZEMPIC, 0.25 OR 0.5 MG/DOSE, 2 MG/1.5ML SOPN [id number] Yes Inject 0.5 mg into the skin once a week. [name], DO Taking Active   pioglitazone (ACTOS) 15 MG tablet [id number] Yes Take 1 tablet (15 mg total) by mouth daily. [name], DO Taking Active   tamsulosin (FLOMAX) 0.4 MG CAPS capsule [id number] Yes TAKE ONE CAPSULE BY MOUTH EVERY NIGHT AT BEDTIME FOR URINE RETENTION [name], DO Taking Active   traZODone (DESYREL) 100 MG tablet [id number] Yes TAKE 1 TABLET BY MOUTH AT BEDTIME [name], DO Taking Active   venlafaxine XR (EFFEXOR-XR) 150 MG 24 hr capsule [id number] Yes Take 150 mg by mouth daily with breakfast. [provider] Taking Active           Patient Active Problem List   Diagnosis Date Noted  . Environmental and seasonal allergies 02/27/2021  . Hyperlipidemia associated with type 2 diabetes mellitus ([place]) 02/27/2021  . Morbid obesity with BMI of 40.0-44.9, adult ([place]) 06/26/2020  . Major depressive disorder, recurrent, moderate ([place]) 06/25/2020  . GAD (generalized anxiety disorder) 06/25/2020  . Type 2 diabetes mellitus with other specified complication ([place]) 35/59/7416  . Chronic pain of right knee 05/27/2020  . Primary osteoarthritis involving multiple joints 05/27/2020  . Cataract associated with type 2 diabetes mellitus ([place]) 05/27/2020  . History of torn meniscus of left knee 05/27/2020  . DDD (degenerative disc disease), lumbar 05/27/2020    Immunization History  Administered Date(s) Administered  . Fluad Quad(high Dose 65+) 10/10/2020  . Moderna Sars-Covid-2 Vaccination 02/09/2020, 03/13/2020  . Tdap  01/16/2018    Conditions to be addressed/monitored: DMII  Care Plan : PharmD - Disease Management T2DM, osteoarthritis  Updates made by Vella Raring, RPH-CPP since 03/10/2021 12:00 AM    Problem: Disease Progression     Long-Range Goal: Disease Progression Prevented or Minimized   Start Date: 11/06/2020  Expected End Date: 01/05/2021  This Visit's Progress: On track  Recent Progress: On track  Priority: High  Note:   Current Barriers:  . Unable to independently afford treatment regimen, particularly when enters coverage gap of [employer] Part D plan o Patient APPROVED for patient assistance from [employer] for [place], [place] through 12/06/2021 o Patient APPROVED for patient assistance from [employer] for [place] through 12/06/2021  Pharmacist Clinical Goal(s):  Marland Kitchen Over the next 60 days, patient will verbalize ability to afford treatment regimen through collaboration with PharmD and provider.   Interventions: . 1:1 collaboration with [name], DO regarding development and update of comprehensive plan of care as evidenced by provider attestation and co-signature . Inter-disciplinary care team collaboration (see longitudinal plan of care)  Medication Assistance: . Return call to patient. . Reports understanding that Novolog is a therapeutic  alternative to Humalog, therapeutic change per PCP as Novolog available from [employer] patient assistance program for cost savings . Reports has received Novolog vial from assistance program. Reports feels comfortable with using vial and syringe for Novolog administration for now, but requests to receive Novolog pen for future refills from patient assistance program . Will collaborate with PCP to request provider send program refill request to [employer] for [employer] and pen needles . Patient denies further medication questions/concerns today   Patient Goals/Self-Care Activities . Over  the next 60 days, patient will:  - collaborate with provider on medication access solutions - patient to call office for new or worsening medical concerns - attend medical appointments as scheduled  Follow Up Plan: Telephone follow up appointment with care management team member scheduled for: December for medication assistance follow up     Medication Assistance: See above  Patient's preferred pharmacy is:  [facility] [place], [place] - [address] AT [address] [place] [address] Phone: [phone] Fax: [phone]   Follow Up:  Patient agrees to Care Plan and Follow-up.  Plan: Telephone follow up appointment with care management team member scheduled for: December for medication assistance follow up  Harlow Asa, PharmD, [facility], [place] [phone]

## 2021-03-10 NOTE — Addendum Note (Signed)
Addended by: Harlow Asa A on: 03/10/2021 01:31 PM   Modules accepted: Level of Service, SmartSet

## 2021-03-10 NOTE — Progress Notes (Signed)
This encounter was created in error - please disregard. This encounter was created in error - please disregard. This encounter was created in error - please disregard. 

## 2021-03-10 NOTE — Telephone Encounter (Signed)
  Chronic Care Management   Outreach Note  03/10/2021 Name: Eric Andrews MRN: [id number] DOB: Jun 23, 1951  Referred by: [name], DO Reason for referral : No chief complaint on file.   Receive voicemail from patient requesting a call back    Return call to patient. Reports received Novolog vial from assistance program. Reports feels comfortable with using vial and syringe for Novolog administration for now, but requests to receive Novolog pen for future refills from patient assistance program  Reports understanding that Novolog is a therapeutic alternative to Humalog, therapeutic change per PCP as Novolog available from [employer] patient assistance program for cost savings  Will collaborate with PCP to request provider send program refill request to [employer] for [employer] and pen needles   Patient denies further medication questions/concerns today  Confirms will call office for further medical questions/concerns   Harlow Asa, PharmD, [place] [facility] [phone]

## 2021-03-11 ENCOUNTER — Encounter: Payer: Self-pay | Admitting: Pulmonary Disease

## 2021-03-11 ENCOUNTER — Ambulatory Visit (INDEPENDENT_AMBULATORY_CARE_PROVIDER_SITE_OTHER): Payer: Medicare Other | Admitting: Pharmacist

## 2021-03-11 DIAGNOSIS — E1169 Type 2 diabetes mellitus with other specified complication: Secondary | ICD-10-CM

## 2021-03-11 DIAGNOSIS — M5412 Radiculopathy, cervical region: Secondary | ICD-10-CM | POA: Diagnosis not present

## 2021-03-11 DIAGNOSIS — M48061 Spinal stenosis, lumbar region without neurogenic claudication: Secondary | ICD-10-CM | POA: Diagnosis not present

## 2021-03-11 DIAGNOSIS — M5441 Lumbago with sciatica, right side: Secondary | ICD-10-CM | POA: Diagnosis not present

## 2021-03-11 DIAGNOSIS — M9931 Osseous stenosis of neural canal of cervical region: Secondary | ICD-10-CM | POA: Diagnosis not present

## 2021-03-11 DIAGNOSIS — M542 Cervicalgia: Secondary | ICD-10-CM | POA: Diagnosis not present

## 2021-03-11 DIAGNOSIS — Z794 Long term (current) use of insulin: Secondary | ICD-10-CM

## 2021-03-11 DIAGNOSIS — M5442 Lumbago with sciatica, left side: Secondary | ICD-10-CM | POA: Diagnosis not present

## 2021-03-11 DIAGNOSIS — G8929 Other chronic pain: Secondary | ICD-10-CM | POA: Diagnosis not present

## 2021-03-11 NOTE — Chronic Care Management (AMB) (Signed)
Chronic Care Management Pharmacy Note  03/11/2021 Name:  Eric Andrews MRN:  [id number] DOB:  1951/06/26  Subjective: Eric Andrews is an 70 y.o. year old male who is a primary patient of [name], DO.  The CCM team was consulted for assistance with disease management and care coordination needs.    Engaged with patient by telephone for follow up visit in response to provider referral for pharmacy case management and/or care coordination services.   Consent to Services:  The patient was given information about Chronic Care Management services, agreed to services, and gave verbal consent prior to initiation of services.  Please see initial visit note for detailed documentation.   Patient Care Team: [name], DO as PCP - General (Family Medicine) Evalyn Shultis, Virl Diamond, RPH-CPP (Pharmacist)  Recent office visits: Office visit with PCP on 3/24   Hospital visits: None in previous 6 months  Objective:  Lab Results  Component Value Date   CREATININE 1.08 06/18/2020   CREATININE 1.1 12/19/2019    Lab Results  Component Value Date   HGBA1C 8.8 (A) 02/27/2021   Last diabetic Eye exam:  Lab Results  Component Value Date/Time   HMDIABEYEEXA No Retinopathy 07/16/2020 12:00 AM        Component Value Date/Time   CHOL 114 06/18/2020 0857   TRIG 181 (H) 06/18/2020 0857   HDL 34 (L) 06/18/2020 0857   CHOLHDL 3.4 06/18/2020 0857   LDLCALC 54 06/18/2020 0857     Social History   Tobacco Use  Smoking Status Never Smoker  Smokeless Tobacco Never Used   BP Readings from Last 3 Encounters:  02/27/21 127/70  12/29/20 (!) 150/84  12/11/20 (!) 151/87   Pulse Readings from Last 3 Encounters:  02/27/21 72  12/29/20 96  12/11/20 76   Wt Readings from Last 3 Encounters:  02/27/21 (!) 320 lb 12.8 oz (145.5 kg)  12/29/20 (!) 307 lb (139.3 kg)  12/11/20 (!) 306 lb 10.6 oz (139.1 kg)    Assessment: Review of patient past medical history, allergies,  medications, health status, including review of consultants reports, laboratory and other test data, was performed as part of comprehensive evaluation and provision of chronic care management services.   SDOH:  (Social Determinants of Health) assessments and interventions performed: none   CCM Care Plan  Allergies  Allergen Reactions  . Dust Mite Extract     Medications Reviewed Today    Reviewed by [name], DO (Physician) on 02/27/21 at [phone]  Med List Status: <None>  Medication Order Taking? Sig Documenting Provider Last Dose Status Informant  aspirin 81 MG EC tablet [id number] Yes Take 81 mg by mouth daily. [provider] Taking Active   atorvastatin (LIPITOR) 20 MG tablet [id number] Yes TAKE 1 TABLET BY MOUTH AT BEDTIME [name], DO Taking Active   busPIRone (BUSPAR) 5 MG tablet [id number] Yes TAKE 1 TABLET(5 MG) BY MOUTH TWICE DAILY AS NEEDED FOR ANXIETY [name], DO Taking Active   celecoxib (CELEBREX) 200 MG capsule [id number] Yes Take 200 mg by mouth 2 (two) times daily. [provider] Taking Active   cetirizine (ZYRTEC) 10 MG tablet [id number] Yes Take 10 mg by mouth at bedtime. [provider] Taking Active   chlorthalidone (HYGROTON) 25 MG tablet [id number] Yes Take 1 tablet (25 mg total) by mouth daily. [name], DO Taking Active   cholecalciferol (VITAMIN D3) 25 MCG (1000 UNIT) tablet [id number] Yes Take 1,000 Units by mouth daily.  [provider] Taking Active   FARXIGA 10 MG TABS tablet [id number] No Take 1 tablet (10 mg total) by mouth daily before breakfast.  Patient not taking: No sig reported   [name], DO Not Taking Active   fenofibrate micronized (LOFIBRA) 134 MG capsule [id number] Yes Take by mouth. [provider] Taking Active   finasteride (PROSCAR) 5 MG tablet [id number] Yes Take 1 tablet (5 mg total) by mouth every evening. [name], DO Taking Active   gabapentin (NEURONTIN) 100 MG capsule [id number] Yes Take 2 capsules by mouth 2 (two) times daily. [provider] Taking Active   insulin glargine, 1 Unit Dial, (TOUJEO SOLOSTAR) 300 UNIT/ML Solostar Pen [id number] Yes Inject 54 Units into the skin every evening.  Patient taking differently: Inject 66 Units into the skin every evening.   [name], DO Taking Active   insulin lispro (HUMALOG) 100 UNIT/ML injection [id number] Yes Inject 13 Units into the skin 2 (two) times daily with a meal. [provider] Taking Active            Med Note Winfield Cunas, Zaniah [place] A   Fri Nov 15, 2020 11:13 AM) Reports taking according to sliding scale from previous Endocrinologist in [place]  Insulin Pen Needle 31G X 5 MM MISC [id number] Yes Use with insulin to inject into skin 3 times daily as directed [name], DO Taking Active   losartan (COZAAR) 50 MG tablet [id number] Yes Take 50 mg by mouth daily. [provider] Taking Active   metFORMIN (GLUCOPHAGE-XR) 500 MG 24 hr tablet [id number] Yes Take 2 tablets (1,000 mg total) by mouth 2 (two) times daily with a meal. [name], [name], DO Taking Active   Multiple Vitamin (MULTIVITAMIN) capsule [id number] Yes Take 1 capsule by mouth daily. [provider] Taking Active   nystatin (MYCOSTATIN) 100000 UNIT/ML suspension [id number] Yes Swish and retain 5 ml in mouth as long as possible QID x 7-14 days [name], DO Taking Active   Omega-3 Fatty Acids (FISH OIL) 1000 MG CAPS [id number] Yes Take by mouth in the morning and at bedtime. [provider] Taking Active   OZEMPIC, 0.25 OR 0.5 MG/DOSE, 2 MG/1.5ML SOPN [id number] Yes Inject 0.5 mg into the skin once a week. [name], DO Taking Active   pioglitazone (ACTOS) 15 MG tablet [id number] Yes Take 1 tablet (15 mg total) by mouth daily. [name], DO Taking Active   tamsulosin (FLOMAX) 0.4  MG CAPS capsule [id number] Yes TAKE ONE CAPSULE BY MOUTH EVERY NIGHT AT BEDTIME FOR URINE RETENTION [name], DO Taking Active   traZODone (DESYREL) 100 MG tablet [id number] Yes TAKE 1 TABLET BY MOUTH AT BEDTIME [name], DO Taking Active   venlafaxine XR (EFFEXOR-XR) 150 MG 24 hr capsule [id number] Yes Take 150 mg by mouth daily with breakfast. [provider] Taking Active           Patient Active Problem List   Diagnosis Date Noted  . Environmental and seasonal allergies 02/27/2021  . Hyperlipidemia associated with type 2 diabetes mellitus ([place]) 02/27/2021  . Morbid obesity with BMI of 40.0-44.9, adult ([place]) 06/26/2020  . Major depressive disorder, recurrent, moderate ([place]) 06/25/2020  . GAD (generalized anxiety disorder) 06/25/2020  . Type 2 diabetes mellitus with other specified complication ([place]) 02/63/7858  . Chronic pain of right knee 05/27/2020  . Primary osteoarthritis involving multiple joints 05/27/2020  . Cataract associated with type 2 diabetes mellitus ([place]) 05/27/2020  .  History of torn meniscus of left knee 05/27/2020  . DDD (degenerative disc disease), lumbar 05/27/2020    Immunization History  Administered Date(s) Administered  . Fluad Quad(high Dose 65+) 10/10/2020  . Moderna Sars-Covid-2 Vaccination 02/09/2020, 03/13/2020  . Tdap 01/16/2018    Conditions to be addressed/monitored: HTN, HLD and DMII  Care Plan : PharmD - Disease Management T2DM, osteoarthritis  Updates made by Vella Raring, RPH-CPP since 03/11/2021 12:00 AM    Problem: Disease Progression     Long-Range Goal: Disease Progression Prevented or Minimized   Start Date: 11/06/2020  Expected End Date: 01/05/2021  This Visit's Progress: On track  Recent Progress: On track  Priority: High  Note:   Current Barriers:  . Unable to independently afford treatment regimen, particularly when enters coverage gap of [employer] Part D  plan o Patient APPROVED for patient assistance from [employer] for [place], [place] through 12/06/2021 o Patient APPROVED for patient assistance from [employer] for [place] through 12/06/2021  Pharmacist Clinical Goal(s):  Marland Kitchen Over the next 60 days, patient will verbalize ability to afford treatment regimen through collaboration with PharmD and provider.   Interventions: . 1:1 collaboration with [name], DO regarding development and update of comprehensive plan of care as evidenced by provider attestation and co-signature . Inter-disciplinary care team collaboration (see longitudinal plan of care) . Perform chart review. Patient seen for Office visit with PCP on 3/24 o A1C increased to 8.8% o Provider advised patient to: - Try to reduce Chlorthalidone 25mg , can try cutting in half for now to see if works fine, then if doing well can trial off of it, if swelling and BP elevated, then may need to restart it - When restart Ozempic, can dial back [place] to 50 unit and then adjust based on fasting sugar reading in AM - if consistently < 150 reduce by about 2 units per 3 days or more. . Today reach patient but not currently home, but states prefers to talk now.  Type 2 Diabetes  . Current treatment o Tresiba 66 units daily in evening  - Reports aware of direction from PCP regarding fasting sugar reading in AM - if consistently < 150 reduce by about 2 units per 3 days or more o Novolog - Reports taking three times daily with meals following sliding scale from previous Endocrinologist in [place] - Today confirms continues to follow sliding scale based on blood sugar prior to each meal: . If less than 140: takes 13 units . If 140-200: takes 14 units . If 200-250: takes 15 units . If 250 or above: takes 16 units o Metformin ER 500 mg - 2 tablets (1000 mg) twice daily o Pioglitazone 15 mg once daily o Ozempic 0.5 mg weekly on Thursdays (reports restarted on 3/24) o Farxiga  10 mg daily (reports restarted last week) . Recalls home blood sugars ranging: ~180-250 o Denies recent low blood sugars and have counseled patient on s/s of low blood sugar and how to treat lows . Reports occasionally missed a dose of Novolog when first switched from Humalog to Novolog as less comfortable with using vial + syringe versus pen device o Note collaborated with PCP to request provider send program refill request to [employer] for [employer] and pen needles o Patient reports comfortable with using vial with syringe for now . Counsel patient to obtain smaller volume, 0.3 mL insulin syringes from pharmacy for now in order to improve ease and accuracy of administration .  Have counseled patient on importance of guidance from provider for adjustment of medications; patient has expressed preference for more self-management of his blood sugar control.  Medication Assistance: . Received message from Bethalto Simcox advising patient received [place] Glade from assistance program and advised of auto-shippment/refill procedure . Collaborated with PCP to request provider send program refill request to [employer] for [employer] and pen needles  . Patient denies further medication questions/needs at this time.  Patient Goals/Self-Care Activities . Over the next 60 days, patient will:  - collaborate with provider on medication access solutions - patient to call office for new or worsening medical concerns - attend medical appointments as scheduled  Follow Up Plan: Telephone follow up appointment with care management team member scheduled for: 4/20 at 10 am     Medication Assistance: Obtained; see above  Patient's preferred pharmacy is:  [id number] [place] [address] AT [address] [facility] [place] [address] Phone: [phone] Fax: [phone]   Follow Up:  Patient agrees to Care Plan and Follow-up.  Plan:  Telephone follow up appointment with care management team member scheduled for:  4/20 at 30 am  Harlow Asa, PharmD, [place], [place] [phone]

## 2021-03-11 NOTE — Addendum Note (Signed)
Addended by: [name] on: 03/11/2021 01:30 PM   Modules accepted: Orders

## 2021-03-11 NOTE — Telephone Encounter (Signed)
Grayland Ormond,  I have the form printed for Novolog Flexpen.  Would you be able to help clarify his current dose? I spoke with the patient on dose titration basal insulin as he gets back on medications, however like our prior conversation he usually does some of his own adjustment.  Any thoughts before I complete the form?  Thanks  Nobie Putnam, [facility] 03/11/2021, 8:15 AM

## 2021-03-11 NOTE — Patient Instructions (Signed)
Visit Information  PATIENT GOALS: Goals Addressed            This Visit's Progress   . Pharmacy Goals       Our goal A1c is less than 7%. This corresponds with fasting sugars less than 130 and 2 hour after meal sugars less than 180. Please check your blood sugar and keep a log of the results  Please check your home blood pressure, keep a log of the results and bring this with you to your medical appointments.  Our goal bad cholesterol, or LDL, is less than 70 . This is why it is important to continue taking your atorvastatin.   Harlow Asa, PharmD, [place] [phone]       The patient verbalized understanding of instructions, educational materials, and care plan provided today and declined offer to receive copy of patient instructions, educational materials, and care plan.   Telephone follow up appointment with care management team member scheduled for: 4/20 at 52 am  Harlow Asa, PharmD, [place], [facility] [phone]

## 2021-03-12 ENCOUNTER — Encounter: Payer: Self-pay | Admitting: Pulmonary Disease

## 2021-03-12 ENCOUNTER — Ambulatory Visit: Payer: Medicare Other | Admitting: Pulmonary Disease

## 2021-03-12 ENCOUNTER — Other Ambulatory Visit: Payer: Self-pay

## 2021-03-12 VITALS — BP 130/82 | HR 83 | Temp 97.5°F | Ht 71.0 in | Wt 314.0 lb

## 2021-03-12 DIAGNOSIS — R0602 Shortness of breath: Secondary | ICD-10-CM | POA: Diagnosis not present

## 2021-03-12 DIAGNOSIS — G4733 Obstructive sleep apnea (adult) (pediatric): Secondary | ICD-10-CM

## 2021-03-12 DIAGNOSIS — Z6841 Body Mass Index (BMI) 40.0 and over, adult: Secondary | ICD-10-CM

## 2021-03-12 NOTE — Patient Instructions (Signed)
Continue CPAP  We will see him in follow-up in 6 months time

## 2021-03-12 NOTE — Progress Notes (Signed)
Subjective:    Patient ID: Eric Andrews, male    DOB: 1951/02/27, 70 y.o.   MRN: [id number]  HPI Patient is a 70 year old lifelong never smoker with a history of obstructive sleep apnea and issues with dyspnea who follows for the same.  This is a scheduled visit.  Patient was restarted on CPAP for the issue of sleep apnea and is currently on 5 to 20 cm H2O and tolerating it well.  He states that he gets restful sleep and is able to sleep all night with it.  His compliance is 100% by his report which is from 7 March through 5 April.  Average pressures are around 12 cm of water pressure.  AHI  events are controlled.  With regards to his dyspnea he has grade 1 diastolic dysfunction noted on 2D echo performed in November 2021 with LVEF of 55 to 60%.  Is also performed in November showed relatively normal flows and volumes with a very severely decreased ERV at 2% consistent with obesity.  Diffusion capacity was normal.  Suspect that most of his issues are due to obesity.  Weight loss has been recommended.  There is no evidence of obstructive lung disease.   Review of Systems Patient Active Problem List   Diagnosis Date Noted  . Environmental and seasonal allergies 02/27/2021  . Hyperlipidemia associated with type 2 diabetes mellitus ([place]) 02/27/2021  . Morbid obesity with BMI of 40.0-44.9, adult ([place]) 06/26/2020  . Major depressive disorder, recurrent, moderate ([place]) 06/25/2020  . GAD (generalized anxiety disorder) 06/25/2020  . Type 2 diabetes mellitus with other specified complication ([place]) 66/05/3015  . Chronic pain of right knee 05/27/2020  . Primary osteoarthritis involving multiple joints 05/27/2020  . Cataract associated with type 2 diabetes mellitus ([place]) 05/27/2020  . History of torn meniscus of left knee 05/27/2020  . DDD (degenerative disc disease), lumbar 05/27/2020   Allergies  Allergen Reactions  . Dust Mite Extract    Current Meds  Medication Sig  . aspirin 81 MG EC tablet  Take 81 mg by mouth daily.  Marland Kitchen atorvastatin (LIPITOR) 20 MG tablet TAKE 1 TABLET BY MOUTH AT BEDTIME  . busPIRone (BUSPAR) 10 MG tablet Take 1 tablet (10 mg total) by mouth 2 (two) times daily.  . celecoxib (CELEBREX) 200 MG capsule Take 1 capsule (200 mg total) by mouth 2 (two) times daily.  . cetirizine (ZYRTEC) 10 MG tablet Take 1 tablet (10 mg total) by mouth at bedtime.  . chlorthalidone (HYGROTON) 25 MG tablet Take 1 tablet (25 mg total) by mouth daily.  . cholecalciferol (VITAMIN D3) 25 MCG (1000 UNIT) tablet Take 1,000 Units by mouth daily.  Marland Kitchen FARXIGA 10 MG TABS tablet Take 10 mg by mouth daily before breakfast.  . fenofibrate micronized (LOFIBRA) 134 MG capsule Take 1 capsule (134 mg total) by mouth daily before breakfast.  . finasteride (PROSCAR) 5 MG tablet Take 1 tablet (5 mg total) by mouth every evening.  . gabapentin (NEURONTIN) 100 MG capsule Take 2 capsules by mouth 2 (two) times daily.  . insulin degludec (TRESIBA FLEXTOUCH) 100 UNIT/ML FlexTouch Pen Inject 66 Units into the skin at bedtime.  . Insulin Pen Needle 31G X 5 MM MISC Use with insulin to inject into skin 3 times daily as directed  . Insulin Syringe-Needle U-100 25G X 1" 1 ML MISC Use to inject Humalog insulin up to 3 times daily with meal  . losartan (COZAAR) 50 MG tablet Take 1 tablet (50 mg total) by  mouth daily.  . metFORMIN (GLUCOPHAGE-XR) 500 MG 24 hr tablet Take 2 tablets (1,000 mg total) by mouth 2 (two) times daily with a meal.  . Multiple Vitamin (MULTIVITAMIN) capsule Take 1 capsule by mouth daily.  Marland Kitchen NOVOLOG FLEXPEN 100 UNIT/ML FlexPen Inject 16 Units into the skin 3 (three) times daily with meals. Sliding scale  . Omega-3 Fatty Acids (FISH OIL) 1000 MG CAPS Take by mouth in the morning and at bedtime.  Marland Kitchen OZEMPIC, 0.25 OR 0.5 MG/DOSE, 2 MG/1.5ML SOPN Inject 0.5 mg into the skin once a week.  . pioglitazone (ACTOS) 15 MG tablet Take 1 tablet (15 mg total) by mouth daily.  . tamsulosin (FLOMAX) 0.4 MG CAPS  capsule TAKE ONE CAPSULE BY MOUTH EVERY NIGHT AT BEDTIME FOR URINE RETENTION  . traZODone (DESYREL) 100 MG tablet Take 1 tablet (100 mg total) by mouth at bedtime.  Marland Kitchen venlafaxine XR (EFFEXOR-XR) 150 MG 24 hr capsule Take 150 mg by mouth daily with breakfast.   Immunization History  Administered Date(s) Administered  . Fluad Quad(high Dose 65+) 10/10/2020  . Moderna SARS-COV2 Booster Vaccination 10/18/2020  . Moderna Sars-Covid-2 Vaccination 02/09/2020, 03/13/2020  . Tdap 01/16/2018       Objective:   Physical Exam BP 130/82 (BP Location: Left Arm, Patient Position: Sitting, Cuff Size: Large)   Pulse 83   Temp (!) 97.5 F (36.4 C) (Temporal)   Ht 5\' 11"  (1.803 m)   Wt (!) 314 lb (142.4 kg)   SpO2 97%   BMI 43.79 kg/m  GENERAL: Morbidly obese man, no acute distress.  Fully ambulatory. HEAD: Normocephalic, atraumatic.  EYES: Pupils equal, round, reactive to light.  No scleral icterus.  MOUTH: Nose/mouth/throat not examined due to masking requirements for COVID 19. NECK: Supple. No thyromegaly. Trachea midline. No JVD.  No adenopathy. PULMONARY: Good air entry bilaterally.  No adventitious sounds. CARDIOVASCULAR: S1 and S2. Regular rate and rhythm.  ABDOMEN: Truncal obesity. MUSCULOSKELETAL: No joint deformity, no clubbing, no edema.  NEUROLOGIC: No focal deficit, no gait disturbance, speech is fluent. SKIN: Intact,warm,dry.  No rashes noted. PSYCH: Mood and behavior normal     Assessment & Plan:     ICD-10-CM   1. OSA (obstructive sleep apnea)  G47.33    Well compensated on current CPAP Patient compliant Patient notes improvement of OSA symptoms  2. Shortness of breath  R06.02    Due to obesity Recommend weight loss  3. Morbid obesity with BMI of 40.0-44.9, adult ([place])  E66.01    Z68.41    Recommend weight loss   Discussion:  Patient is doing well with his current CPAP pressures.  Continue the same.  It is recommended he engage in weight loss this will help  tremendously with this shortness of breath.  We will see him in follow-up in 6 months time he is to contact us prior to that time should any new difficulties arise.   Renold Don, MD [facility]   *This note was dictated using voice recognition software/Dragon.  Despite best efforts to proofread, errors can occur which can change the meaning.  Any change was purely unintentional.

## 2021-03-17 ENCOUNTER — Ambulatory Visit: Payer: Self-pay | Admitting: Pharmacist

## 2021-03-17 DIAGNOSIS — E1169 Type 2 diabetes mellitus with other specified complication: Secondary | ICD-10-CM

## 2021-03-17 DIAGNOSIS — Z794 Long term (current) use of insulin: Secondary | ICD-10-CM | POA: Diagnosis not present

## 2021-03-17 DIAGNOSIS — E785 Hyperlipidemia, unspecified: Secondary | ICD-10-CM

## 2021-03-17 NOTE — Chronic Care Management (AMB) (Signed)
Chronic Care Management Pharmacy Note  03/17/2021 Name:  Eric Andrews MRN:  [id number] DOB:  Aug 10, 1951  Subjective: Eric Andrews is an 70 y.o. year old male who is a primary patient of [name], DO.  The CCM team was consulted for assistance with disease management and care coordination needs.    Collaboration with [employer] patient assistane program for medication assistance follow up in response to provider referral for pharmacy case management and/or care coordination services.   Consent to Services:  The patient was given information about Chronic Care Management services, agreed to services, and gave verbal consent prior to initiation of services.  Please see initial visit note for detailed documentation.   Patient Care Team: [name], DO as PCP - General (Family Medicine) Vinicio Lynk, Virl Diamond, RPH-CPP (Pharmacist)    CCM Care Plan  Allergies  Allergen Reactions  . Dust Mite Extract     Medications   Reviewed by Tyler Pita, MD (Physician) on 03/12/21 at 67  Med List Status: <None>  Medication Order Taking? Sig Documenting Provider Last Dose Status Informant  aspirin 81 MG EC tablet [id number] Yes Take 81 mg by mouth daily. [provider] Taking Active   atorvastatin (LIPITOR) 20 MG tablet [id number] Yes TAKE 1 TABLET BY MOUTH AT BEDTIME Karamalegos, [name], DO Taking Active   busPIRone (BUSPAR) 10 MG tablet [id number] Yes Take 1 tablet (10 mg total) by mouth 2 (two) times daily. [name], DO Taking Active   celecoxib (CELEBREX) 200 MG capsule [id number] Yes Take 1 capsule (200 mg total) by mouth 2 (two) times daily. [name], DO Taking Active   cetirizine (ZYRTEC) 10 MG tablet [id number] Yes Take 1 tablet (10 mg total) by mouth at bedtime. [name], DO Taking Active   chlorthalidone (HYGROTON) 25 MG tablet [id number] Yes Take 1 tablet (25 mg total) by mouth daily.  [name], DO Taking Active   cholecalciferol (VITAMIN D3) 25 MCG (1000 UNIT) tablet [id number] Yes Take 1,000 Units by mouth daily. [provider] Taking Active   FARXIGA 10 MG TABS tablet [id number] Yes Take 10 mg by mouth daily before breakfast. [name], DO Taking Active   fenofibrate micronized (LOFIBRA) 134 MG capsule [id number] Yes Take 1 capsule (134 mg total) by mouth daily before breakfast. [name], DO Taking Active   finasteride (PROSCAR) 5 MG tablet [id number] Yes Take 1 tablet (5 mg total) by mouth every evening. [name], DO Taking Active   gabapentin (NEURONTIN) 100 MG capsule [id number] Yes Take 2 capsules by mouth 2 (two) times daily. [provider] Taking Active   insulin degludec (TRESIBA FLEXTOUCH) 100 UNIT/ML FlexTouch Pen [id number] Yes Inject 66 Units into the skin at bedtime. [provider] Taking Active   Insulin Pen Needle 31G X 5 MM MISC [id number] Yes Use with insulin to inject into skin 3 times daily as directed [name], DO Taking Active   Insulin Syringe-Needle U-100 25G X 1" 1 ML MISC [id number] Yes Use to inject Humalog insulin up to 3 times daily with meal Karamalegos, [name], DO Taking Active   losartan (COZAAR) 50 MG tablet [id number] Yes Take 1 tablet (50 mg total) by mouth daily. [name], DO Taking Active   metFORMIN (GLUCOPHAGE-XR) 500 MG 24 hr tablet [id number] Yes Take 2 tablets (1,000 mg total) by mouth 2 (two) times daily with a meal. [name], [name], DO Taking Active  Multiple Vitamin (MULTIVITAMIN) capsule [id number] Yes Take 1 capsule by mouth daily. [provider] Taking Active   NOVOLOG FLEXPEN 100 UNIT/ML FlexPen [id number] Yes Inject 16 Units into the skin 3 (three) times daily with meals. Sliding scale [name], DO Taking Active   Omega-3 Fatty Acids (FISH OIL) 1000 MG CAPS [id number] Yes Take by  mouth in the morning and at bedtime. [provider] Taking Active   OZEMPIC, 0.25 OR 0.5 MG/DOSE, 2 MG/1.5ML SOPN [id number] Yes Inject 0.5 mg into the skin once a week. [name], DO Taking Active   pioglitazone (ACTOS) 15 MG tablet [id number] Yes Take 1 tablet (15 mg total) by mouth daily. [name], DO Taking Active   tamsulosin (FLOMAX) 0.4 MG CAPS capsule [id number] Yes TAKE ONE CAPSULE BY MOUTH EVERY NIGHT AT BEDTIME FOR URINE RETENTION [name], DO Taking Active   traZODone (DESYREL) 100 MG tablet [id number] Yes Take 1 tablet (100 mg total) by mouth at bedtime. [name], DO Taking Active   venlafaxine XR (EFFEXOR-XR) 150 MG 24 hr capsule [id number] Yes Take 150 mg by mouth daily with breakfast. [provider] Taking Active           Patient Active Problem List   Diagnosis Date Noted  . Environmental and seasonal allergies 02/27/2021  . Hyperlipidemia associated with type 2 diabetes mellitus ([place]) 02/27/2021  . Morbid obesity with BMI of 40.0-44.9, adult ([place]) 06/26/2020  . Major depressive disorder, recurrent, moderate ([place]) 06/25/2020  . GAD (generalized anxiety disorder) 06/25/2020  . Type 2 diabetes mellitus with other specified complication ([place]) 27/51/7001  . Chronic pain of right knee 05/27/2020  . Primary osteoarthritis involving multiple joints 05/27/2020  . Cataract associated with type 2 diabetes mellitus ([place]) 05/27/2020  . History of torn meniscus of left knee 05/27/2020  . DDD (degenerative disc disease), lumbar 05/27/2020    Immunization History  Administered Date(s) Administered  . Fluad Quad(high Dose 65+) 10/10/2020  . Moderna SARS-COV2 Booster Vaccination 10/18/2020  . Moderna Sars-Covid-2 Vaccination 02/09/2020, 03/13/2020  . Tdap 01/16/2018    Conditions to be addressed/monitored: HLD and DMII  Care Plan : PharmD - Disease Management T2DM, osteoarthritis  Updates made by  Vella Raring, RPH-CPP since 03/17/2021 12:00 AM    Problem: Disease Progression     Long-Range Goal: Disease Progression Prevented or Minimized   Start Date: 11/06/2020  Expected End Date: 01/05/2021  This Visit's Progress: On track  Recent Progress: On track  Priority: High  Note:   Current Barriers:  . Unable to independently afford treatment regimen, particularly when enters coverage gap of [employer] Part D plan o Patient APPROVED for patient assistance from [employer] for [place], [place] through 12/06/2021 o Patient APPROVED for patient assistance from [employer] for [place] through 12/06/2021  Pharmacist Clinical Goal(s):  Marland Kitchen Over the next 60 days, patient will verbalize ability to afford treatment regimen through collaboration with PharmD and provider.   Interventions: . 1:1 collaboration with [name], DO regarding development and update of comprehensive plan of care as evidenced by provider attestation and co-signature . Inter-disciplinary care team collaboration (see longitudinal plan of care)  Medication Assistance: . Place coordination of care call to [place] to follow up regarding status of Novolog Flexpen Rx for patient as sent to program by PCP on 4/5. Speak with [employer] representative who advises that the refill/change request form was not received by the program. o Representative recommends that  provider list quantity of each product in terms of days supply (120 days). o Representative recommends follow up after 48 hours. . Collaborate with Loni Muse CMA in office and PCP to request refill/change request be refaxed to [employer].  Patient Goals/Self-Care Activities . Over the next 60 days, patient will:  - collaborate with provider on medication access solutions - patient to call office for new or worsening medical concerns - attend medical appointments as scheduled  Follow Up Plan: Telephone follow up  appointment with care management team member scheduled for: 4/20 at 10 am     Patient's preferred pharmacy is:  [facility] DRUG STORE [place], [place] - [address] AT [address] [facility] [place] [address] Phone: [phone] Fax: [phone]   Telephone follow up appointment with care management team member scheduled for: 4/20 at 10 am  Harlow Asa, PharmD, [address] [place], [place] [phone]

## 2021-03-19 NOTE — Telephone Encounter (Signed)
Pt calling in again regarding the handicap placards that he requested. Pt is requesting to have a call back. Please advise.

## 2021-03-19 NOTE — Telephone Encounter (Signed)
I called and spoke with the patient Eric Andrews and notified him again that his handicap placards are upfront and available for him to pick up at his convenience.

## 2021-03-21 ENCOUNTER — Ambulatory Visit: Payer: Self-pay | Admitting: Pharmacist

## 2021-03-21 DIAGNOSIS — E1169 Type 2 diabetes mellitus with other specified complication: Secondary | ICD-10-CM

## 2021-03-21 DIAGNOSIS — Z794 Long term (current) use of insulin: Secondary | ICD-10-CM

## 2021-03-21 NOTE — Chronic Care Management (AMB) (Signed)
Chronic Care Management Pharmacy Note  03/21/2021 Name:  Eric Andrews MRN:  [id number] DOB:  27-Feb-1951  Subjective: Eric Andrews is an 70 y.o. year old male who is a primary patient of Eric Hauser, DO.  The CCM team was consulted for assistance with disease management and care coordination needs.    Collaboration with [employer] patient assistane program for medication assistance follow up in response to provider referral for pharmacy case management and/or care coordination services.   Consent to Services:  The patient was given information about Chronic Care Management services, agreed to services, and gave verbal consent prior to initiation of services.  Please see initial visit note for detailed documentation.   Patient Care Team: Eric Hauser, DO as PCP - General (Family Medicine) [place], Virl Diamond, RPH-CPP (Pharmacist)    CCM Care Plan  Allergies  Allergen Reactions  . Dust Mite Extract     Medications Reviewed Today    Reviewed by Tyler Pita, MD (Physician) on 03/12/21 at 60  Med List Status: <None>  Medication Order Taking? Sig Documenting Provider Last Dose Status Informant  aspirin 81 MG EC tablet [id number] Yes Take 81 mg by mouth daily. [provider] Taking Active   atorvastatin (LIPITOR) 20 MG tablet [id number] Yes TAKE 1 TABLET BY MOUTH AT BEDTIME Karamalegos, [name], DO Taking Active   busPIRone (BUSPAR) 10 MG tablet [id number] Yes Take 1 tablet (10 mg total) by mouth 2 (two) times daily. Eric Hauser, DO Taking Active   celecoxib (CELEBREX) 200 MG capsule [id number] Yes Take 1 capsule (200 mg total) by mouth 2 (two) times daily. Eric Hauser, DO Taking Active   cetirizine (ZYRTEC) 10 MG tablet [id number] Yes Take 1 tablet (10 mg total) by mouth at bedtime. Eric Hauser, DO Taking Active   chlorthalidone (HYGROTON) 25 MG tablet [id number] Yes Take 1 tablet (25 mg total) by mouth  daily. Eric Hauser, DO Taking Active   cholecalciferol (VITAMIN D3) 25 MCG (1000 UNIT) tablet [id number] Yes Take 1,000 Units by mouth daily. [provider] Taking Active   FARXIGA 10 MG TABS tablet [id number] Yes Take 10 mg by mouth daily before breakfast. Eric Hauser, DO Taking Active   fenofibrate micronized (LOFIBRA) 134 MG capsule [id number] Yes Take 1 capsule (134 mg total) by mouth daily before breakfast. Eric Hauser, DO Taking Active   finasteride (PROSCAR) 5 MG tablet [id number] Yes Take 1 tablet (5 mg total) by mouth every evening. Eric Hauser, DO Taking Active   gabapentin (NEURONTIN) 100 MG capsule [id number] Yes Take 2 capsules by mouth 2 (two) times daily. [provider] Taking Active   insulin degludec (TRESIBA FLEXTOUCH) 100 UNIT/ML FlexTouch Pen [id number] Yes Inject 66 Units into the skin at bedtime. [provider] Taking Active   Insulin Pen Needle 31G X 5 MM MISC [id number] Yes Use with insulin to inject into skin 3 times daily as directed Eric Hauser, DO Taking Active   Insulin Syringe-Needle U-100 25G X 1" 1 ML MISC [id number] Yes Use to inject Humalog insulin up to 3 times daily with meal Karamalegos, [name], DO Taking Active   losartan (COZAAR) 50 MG tablet [id number] Yes Take 1 tablet (50 mg total) by mouth daily. Eric Hauser, DO Taking Active   metFORMIN (GLUCOPHAGE-XR) 500 MG 24 hr tablet [id number] Yes Take 2 tablets (1,000 mg total) by mouth 2 (two) times daily with a meal. [name], [name], DO Taking  Active   Multiple Vitamin (MULTIVITAMIN) capsule [id number] Yes Take 1 capsule by mouth daily. [provider] Taking Active   NOVOLOG FLEXPEN 100 UNIT/ML FlexPen [id number] Yes Inject 16 Units into the skin 3 (three) times daily with meals. Sliding scale Eric Hauser, DO Taking Active   Omega-3 Fatty Acids (FISH OIL) 1000 MG CAPS [id number] Yes Take  by mouth in the morning and at bedtime. [provider] Taking Active   OZEMPIC, 0.25 OR 0.5 MG/DOSE, 2 MG/1.5ML SOPN [id number] Yes Inject 0.5 mg into the skin once a week. Eric Hauser, DO Taking Active   pioglitazone (ACTOS) 15 MG tablet [id number] Yes Take 1 tablet (15 mg total) by mouth daily. Eric Hauser, DO Taking Active   tamsulosin (FLOMAX) 0.4 MG CAPS capsule [id number] Yes TAKE ONE CAPSULE BY MOUTH EVERY NIGHT AT BEDTIME FOR URINE RETENTION Eric Hauser, DO Taking Active   traZODone (DESYREL) 100 MG tablet [id number] Yes Take 1 tablet (100 mg total) by mouth at bedtime. Eric Hauser, DO Taking Active   venlafaxine XR (EFFEXOR-XR) 150 MG 24 hr capsule [id number] Yes Take 150 mg by mouth daily with breakfast. [provider] Taking Active           Patient Active Problem List   Diagnosis Date Noted  . Environmental and seasonal allergies 02/27/2021  . Hyperlipidemia associated with type 2 diabetes mellitus ([place]) 02/27/2021  . Morbid obesity with BMI of 40.0-44.9, adult ([place]) 06/26/2020  . Major depressive disorder, recurrent, moderate ([place]) 06/25/2020  . GAD (generalized anxiety disorder) 06/25/2020  . Type 2 diabetes mellitus with other specified complication ([place]) 22/48/2500  . Chronic pain of right knee 05/27/2020  . Primary osteoarthritis involving multiple joints 05/27/2020  . Cataract associated with type 2 diabetes mellitus ([place]) 05/27/2020  . History of torn meniscus of left knee 05/27/2020  . DDD (degenerative disc disease), lumbar 05/27/2020    Immunization History  Administered Date(s) Administered  . Fluad Quad(high Dose 65+) 10/10/2020  . Moderna SARS-COV2 Booster Vaccination 10/18/2020  . Moderna Sars-Covid-2 Vaccination 02/09/2020, 03/13/2020  . Tdap 01/16/2018    Conditions to be addressed/monitored: HLD and DMII  Care Plan : PharmD - Disease Management T2DM, osteoarthritis  Updates made by  Vella Raring, RPH-CPP since 03/21/2021 12:00 AM    Problem: Disease Progression     Long-Range Goal: Disease Progression Prevented or Minimized   Start Date: 11/06/2020  Expected End Date: 01/05/2021  This Visit's Progress: On track  Recent Progress: On track  Priority: High  Note:   Current Barriers:  . Unable to independently afford treatment regimen, particularly when enters coverage gap of [employer] Part D plan o Patient APPROVED for patient assistance from [employer] for [place], [place] through 12/06/2021 o Patient APPROVED for patient assistance from [employer] for [place] through 12/06/2021  Pharmacist Clinical Goal(s):  Marland Kitchen Over the next 60 days, patient will verbalize ability to afford treatment regimen through collaboration with PharmD and provider.   Interventions: . 1:1 collaboration with Eric Hauser, DO regarding development and update of comprehensive plan of care as evidenced by provider attestation and co-signature . Inter-disciplinary care team collaboration (see longitudinal plan of care)  Medication Assistance: . Place coordination of care call to [place] to follow up regarding status of Novolog Flexpen Rx for patient as sent to program by PCP again on 4/11. Speak with [employer] who confirms the refill/change request form was received and Rx for [employer]  Flexpen filled on 4/13, placed on automatic refill and expected to arrive in office in 8-10 business days o Today representative states program cannot use previous pen needle Rx for both Tresiba and Novolog pen devices, despite advice from previous representative. Requests new Rx for pen needles that reflects quantity needed of #5 boxes for 120 day supply . Collaborate with Loni Muse CMA in office and PCP to request refill/change request be faxed to [employer] for NovoFine Plus pen needles to Attn: Patient [phone] for quantity of #5 boxes for 120  day supply  Patient Goals/Self-Care Activities . Over the next 60 days, patient will:  - collaborate with provider on medication access solutions - patient to call office for new or worsening medical concerns - attend medical appointments as scheduled  Follow Up Plan: Telephone follow up appointment with care management team member scheduled for: 4/20 at 10 am     Medication Assistance:  o Patient APPROVED for patient assistance from [employer] for [place], [place] through 12/06/2021 o Patient APPROVED for patient assistance from [employer] for [place] through 12/06/2021  Patient's preferred pharmacy is:  [facility] DRUG STORE [place], [place] - [address] AT [address] [facility] [place] [address] Phone: [phone] Fax: [phone]   Plan: Telephone follow up appointment with care management team member scheduled for: 4/20 at 10 am  Harlow Asa, PharmD, Para [place], [place] [phone]

## 2021-03-26 ENCOUNTER — Ambulatory Visit: Payer: Medicare Other | Admitting: Pharmacist

## 2021-03-26 DIAGNOSIS — Z794 Long term (current) use of insulin: Secondary | ICD-10-CM | POA: Diagnosis not present

## 2021-03-26 DIAGNOSIS — E1169 Type 2 diabetes mellitus with other specified complication: Secondary | ICD-10-CM

## 2021-03-26 NOTE — Patient Instructions (Signed)
Visit Information  PATIENT GOALS: Goals Addressed            This Visit's Progress   . Pharmacy Goals       Our goal A1c is less than 7%. This corresponds with fasting sugars less than 130 and 2 hour after meal sugars less than 180. Please check your blood sugar and keep a log of the results  Please check your home blood pressure, keep a log of the results and bring this with you to your medical appointments.  Our goal bad cholesterol, or LDL, is less than 70 . This is why it is important to continue taking your atorvastatin.   Harlow Asa, PharmD, [place] [phone]       The patient verbalized understanding of instructions, educational materials, and care plan provided today and declined offer to receive copy of patient instructions, educational materials, and care plan.   Telephone follow up appointment with care management team member scheduled for: 5/9 at 9:45 am

## 2021-03-26 NOTE — Chronic Care Management (AMB) (Signed)
Chronic Care Management Pharmacy Note  03/26/2021 Name:  Eric Andrews MRN:  [id number] DOB:  November 29, 1951  Subjective: Damion Kant is an 70 y.o. year old male who is a primary patient of [name], DO.  The CCM team was consulted for assistance with disease management and care coordination needs.    Engaged with patient by telephone for follow up visit in response to provider referral for pharmacy case management and/or care coordination services.   Consent to Services:  The patient was given information about Chronic Care Management services, agreed to services, and gave verbal consent prior to initiation of services.  Please see initial visit note for detailed documentation.   Patient Care Team: [name], DO as PCP - General (Family Medicine) Vinita Prentiss, Virl Diamond, RPH-CPP (Pharmacist)   Recent consult visits: Office Visit with [facility] on 4/5 for follow-up for the evaluation of neck and left arm pain as well as back and bilateral hip pain Office Visit with [facility] on 4/6 for follow up.   Hospital visits: None in previous 6 months  Objective:  Lab Results  Component Value Date   CREATININE 1.08 06/18/2020   CREATININE 1.1 12/19/2019    Lab Results  Component Value Date   HGBA1C 8.8 (A) 02/27/2021   Last diabetic Eye exam:  Lab Results  Component Value Date/Time   HMDIABEYEEXA No Retinopathy 07/16/2020 12:00 AM    Last diabetic Foot exam: No results found for: HMDIABFOOTEX      Component Value Date/Time   CHOL 114 06/18/2020 0857   TRIG 181 (H) 06/18/2020 0857   HDL 34 (L) 06/18/2020 0857   CHOLHDL 3.4 06/18/2020 0857   LDLCALC 54 06/18/2020 0857    Social History   Tobacco Use  Smoking Status Never Smoker  Smokeless Tobacco Never Used   BP Readings from Last 3 Encounters:  03/12/21 130/82  02/27/21 127/70  12/29/20 (!) 150/84   Pulse Readings from Last 3  Encounters:  03/12/21 83  02/27/21 72  12/29/20 96   Wt Readings from Last 3 Encounters:  03/12/21 (!) 314 lb (142.4 kg)  02/27/21 (!) 320 lb 12.8 oz (145.5 kg)  12/29/20 (!) 307 lb (139.3 kg)    Assessment: Review of patient past medical history, allergies, medications, health status, including review of consultants reports, laboratory and other test data, was performed as part of comprehensive evaluation and provision of chronic care management services.   SDOH:  (Social Determinants of Health) assessments and interventions performed: none   CCM Care Plan  Allergies  Allergen Reactions  . Dust Mite Extract     Medications Reviewed Today    Reviewed by Tyler Pita, MD (Physician) on 03/12/21 at 67  Med List Status: <None>  Medication Order Taking? Sig Documenting Provider Last Dose Status Informant  aspirin 81 MG EC tablet [id number] Yes Take 81 mg by mouth daily. [provider] Taking Active   atorvastatin (LIPITOR) 20 MG tablet [id number] Yes TAKE 1 TABLET BY MOUTH AT BEDTIME Karamalegos, [name], DO Taking Active   busPIRone (BUSPAR) 10 MG tablet [id number] Yes Take 1 tablet (10 mg total) by mouth 2 (two) times daily. [name], DO Taking Active   celecoxib (CELEBREX) 200 MG capsule [id number] Yes Take 1 capsule (200 mg total) by mouth 2 (two) times daily. [name], DO Taking Active   cetirizine (ZYRTEC) 10 MG tablet [id number] Yes Take 1 tablet (10 mg total) by mouth  at bedtime. [name], DO Taking Active   chlorthalidone (HYGROTON) 25 MG tablet [id number] Yes Take 1 tablet (25 mg total) by mouth daily. [name], DO Taking Active   cholecalciferol (VITAMIN D3) 25 MCG (1000 UNIT) tablet [id number] Yes Take 1,000 Units by mouth daily. [provider] Taking Active   FARXIGA 10 MG TABS tablet [id number] Yes Take 10 mg by mouth daily before breakfast. [name], DO Taking Active    fenofibrate micronized (LOFIBRA) 134 MG capsule [id number] Yes Take 1 capsule (134 mg total) by mouth daily before breakfast. [name], DO Taking Active   finasteride (PROSCAR) 5 MG tablet [id number] Yes Take 1 tablet (5 mg total) by mouth every evening. [name], DO Taking Active   gabapentin (NEURONTIN) 100 MG capsule [id number] Yes Take 2 capsules by mouth 2 (two) times daily. [provider] Taking Active   insulin degludec (TRESIBA FLEXTOUCH) 100 UNIT/ML FlexTouch Pen [id number] Yes Inject 66 Units into the skin at bedtime. [provider] Taking Active   Insulin Pen Needle 31G X 5 MM MISC [id number] Yes Use with insulin to inject into skin 3 times daily as directed [name], DO Taking Active   Insulin Syringe-Needle U-100 25G X 1" 1 ML MISC [id number] Yes Use to inject Humalog insulin up to 3 times daily with meal Karamalegos, [name], DO Taking Active   losartan (COZAAR) 50 MG tablet [id number] Yes Take 1 tablet (50 mg total) by mouth daily. [name], DO Taking Active   metFORMIN (GLUCOPHAGE-XR) 500 MG 24 hr tablet [id number] Yes Take 2 tablets (1,000 mg total) by mouth 2 (two) times daily with a meal. [name], [name], DO Taking Active   Multiple Vitamin (MULTIVITAMIN) capsule [id number] Yes Take 1 capsule by mouth daily. [provider] Taking Active   NOVOLOG FLEXPEN 100 UNIT/ML FlexPen [id number] Yes Inject 16 Units into the skin 3 (three) times daily with meals. Sliding scale [name], DO Taking Active   Omega-3 Fatty Acids (FISH OIL) 1000 MG CAPS [id number] Yes Take by mouth in the morning and at bedtime. [provider] Taking Active   OZEMPIC, 0.25 OR 0.5 MG/DOSE, 2 MG/1.5ML SOPN [id number] Yes Inject 0.5 mg into the skin once a week. [name], DO Taking Active   pioglitazone (ACTOS) 15 MG tablet [id number] Yes Take 1 tablet (15 mg total) by mouth daily.  [name], DO Taking Active   tamsulosin (FLOMAX) 0.4 MG CAPS capsule [id number] Yes TAKE ONE CAPSULE BY MOUTH EVERY NIGHT AT BEDTIME FOR URINE RETENTION [name], DO Taking Active   traZODone (DESYREL) 100 MG tablet [id number] Yes Take 1 tablet (100 mg total) by mouth at bedtime. [name], DO Taking Active   venlafaxine XR (EFFEXOR-XR) 150 MG 24 hr capsule [id number] Yes Take 150 mg by mouth daily with breakfast. [provider] Taking Active           Patient Active Problem List   Diagnosis Date Noted  . Environmental and seasonal allergies 02/27/2021  . Hyperlipidemia associated with type 2 diabetes mellitus ([place]) 02/27/2021  . Morbid obesity with BMI of 40.0-44.9, adult ([place]) 06/26/2020  . Major depressive disorder, recurrent, moderate ([place]) 06/25/2020  . GAD (generalized anxiety disorder) 06/25/2020  . Type 2 diabetes mellitus with other specified complication ([place]) 44/31/5400  . Chronic pain of right knee 05/27/2020  . Primary osteoarthritis involving multiple joints 05/27/2020  . Cataract associated with type 2 diabetes  mellitus ([place]) 05/27/2020  . History of torn meniscus of left knee 05/27/2020  . DDD (degenerative disc disease), lumbar 05/27/2020    Immunization History  Administered Date(s) Administered  . Fluad Quad(high Dose 65+) 10/10/2020  . Moderna SARS-COV2 Booster Vaccination 10/18/2020  . Moderna Sars-Covid-2 Vaccination 02/09/2020, 03/13/2020  . Tdap 01/16/2018    Conditions to be addressed/monitored: HLD and DMII  Care Plan : PharmD - Disease Management T2DM, osteoarthritis  Updates made by Vella Raring, RPH-CPP since 03/26/2021 12:00 AM    Problem: Disease Progression     Long-Range Goal: Disease Progression Prevented or Minimized   Start Date: 11/06/2020  Expected End Date: 01/05/2021  This Visit's Progress: On track  Recent Progress: On track  Priority: High  Note:   Current Barriers:   . Unable to independently afford treatment regimen, particularly when enters coverage gap of [employer] Part D plan o Patient APPROVED for patient assistance from [employer] for [place], [place] through 12/06/2021 o Patient APPROVED for patient assistance from [employer] for [place] through 12/06/2021  Pharmacist Clinical Goal(s):  Marland Kitchen Over the next 60 days, patient will verbalize ability to afford treatment regimen through collaboration with PharmD and provider.   Interventions: . 1:1 collaboration with [name], DO regarding development and update of comprehensive plan of care as evidenced by provider attestation and co-signature . Inter-disciplinary care team collaboration (see longitudinal plan of care) . Perform chart review o Patient seen for Office Visit with [facility] on 4/5 for follow-up for the evaluation of neck and left arm pain as well as back and bilateral hip pain. Referral placed again to physical therapy o Patient seen for Office Visit with [facility] on 4/6 for follow up. Provider recommended: - Continue CPAP - Weight loss  Medication Assistance: . Place coordination of care call to [place] to follow up regarding status of Novolog Flexpen and pen needles Rxs for patient. Speak with [place] Rx filled on 4/13 and should arrive to office by next week o Confirms updated pen needle Rx received and in process, 3 boxes of pen needles should arrive to office in 10-14 business days  Type 2 Diabetes  . Follow up with patient today to provide update regarding Novolog Flexpen and pen needles Rxs from patient assistance program o Note patient requested change from Novolog vial to Flexpen as less comfortable with using vial + syringe versus pen device o Reports continues to use vial + syringe for Novolog vial for now following  sliding scale (below) - Again advise patient to obtain smaller volume, 0.3 mL insulin syringes from pharmacy for now in order to improve ease and accuracy of administration . Current treatment ? Tresiba 66 units daily in evening  ? Previous direction from PCP regarding fasting sugar reading in AM: if consistently < 150 reduce by about 2 units per 3 days or more ? Novolog - Reports taking three times daily with meals following sliding scale from previous Endocrinologist in [place] ? Reported sliding scale based on blood sugar prior to each meal: ? If less than 140: takes 13 units ? If 140-200: takes 14 units ? If 200-250: takes 15 units ? If 250 or above: takes 16 units ? Metformin ER 500 mg - 2 tablets (1000 mg) twice daily ? Pioglitazone 15 mg once daily ? Ozempic 0.5 mg weekly on Thursdays (reports restarted on 3/24) ? Farxiga 10 mg  daily  . Recalls recent home blood sugars ranging: ~150-200 ? Denies recent low blood sugars and have counseled patient on s/s of low blood sugar and how to treat lows . Reports continues to focus on diet to improve blood sugar control - reports improvement in having well-balanced meals throughout the day . Have counseled patient on importance of guidance from provider for adjustment of medications; patient has expressed preference for more self-management of his blood sugar control.     Patient denies further medication questions/needs at this time.  Patient Goals/Self-Care Activities . Over the next 60 days, patient will:  - collaborate with provider on medication access solutions - patient to call office for new or worsening medical concerns - attend medical appointments as scheduled  Follow Up Plan: Telephone follow up appointment with care management team member scheduled for: 5/9 at 9:45 am    Medication Assistance:  Patient APPROVED for patient assistance from [employer] for [place], [place] and Tres[place] through 12/06/2021 Patient APPROVED for  patient assistance from [employer] for [place] through 12/06/2021  Patient's preferred pharmacy is:  [facility] [place], [place] [address] [address] Phone: [phone] Fax: [phone]   Follow Up:  Patient agrees to Care Plan and Follow-up.  Plan: Telephone follow up appointment with care management team member scheduled for:  5/9 at 9:45 am  Harlow Asa, PharmD, [place], [place] [phone]

## 2021-04-09 DIAGNOSIS — G4733 Obstructive sleep apnea (adult) (pediatric): Secondary | ICD-10-CM | POA: Diagnosis not present

## 2021-04-10 DIAGNOSIS — G4733 Obstructive sleep apnea (adult) (pediatric): Secondary | ICD-10-CM | POA: Diagnosis not present

## 2021-04-14 ENCOUNTER — Ambulatory Visit (INDEPENDENT_AMBULATORY_CARE_PROVIDER_SITE_OTHER): Payer: Medicare Other | Admitting: Pharmacist

## 2021-04-14 DIAGNOSIS — E1169 Type 2 diabetes mellitus with other specified complication: Secondary | ICD-10-CM | POA: Diagnosis not present

## 2021-04-14 DIAGNOSIS — Z794 Long term (current) use of insulin: Secondary | ICD-10-CM | POA: Diagnosis not present

## 2021-04-14 DIAGNOSIS — I10 Essential (primary) hypertension: Secondary | ICD-10-CM

## 2021-04-14 NOTE — Chronic Care Management (AMB) (Signed)
Chronic Care Management Pharmacy Note  04/14/2021 Name:  Eric Andrews MRN:  [id number] DOB:  08-11-1951  Subjective: Eric Andrews is an 70 y.o. year old male who is a primary patient of [name], DO.  The CCM team was consulted for assistance with disease management and care coordination needs.    Engaged with patient by telephone for follow up visit in response to provider referral for pharmacy case management and/or care coordination services.   Consent to Services:  The patient was given information about Chronic Care Management services, agreed to services, and gave verbal consent prior to initiation of services.  Please see initial visit note for detailed documentation.   Patient Care Team: [name], DO as PCP - General (Family Medicine) Pennie Vanblarcom, Virl Diamond, RPH-CPP (Pharmacist)  Recent office visits: None  Hospital visits: None in previous 6 months  Objective:  Lab Results  Component Value Date   CREATININE 1.08 06/18/2020   CREATININE 1.1 12/19/2019    Lab Results  Component Value Date   HGBA1C 8.8 (A) 02/27/2021   Last diabetic Eye exam:  Lab Results  Component Value Date/Time   HMDIABEYEEXA No Retinopathy 07/16/2020 12:00 AM    Last diabetic Foot exam: No results found for: HMDIABFOOTEX      Component Value Date/Time   CHOL 114 06/18/2020 0857   TRIG 181 (H) 06/18/2020 0857   HDL 34 (L) 06/18/2020 0857   CHOLHDL 3.4 06/18/2020 0857   LDLCALC 54 06/18/2020 0857    Hepatic Function Latest Ref Rng & Units 06/18/2020 12/19/2019  Total Protein 6.1 - 8.1 g/dL 6.2 -  Albumin 3.5 - 5.0 - 4.6  AST 10 - 35 U/L 39(H) 38  ALT 9 - 46 U/L 61(H) 62(A)  Alk Phosphatase 25 - 125 - 37  Total Bilirubin 0.2 - 1.2 mg/dL 0.6 -    Lab Results  Component Value Date/Time   TSH 2.21 12/19/2019 12:00 AM    CBC Latest Ref Rng & Units 06/18/2020 12/14/2018  WBC 3.8 - 10.8 Thousand/uL 5.0 12.1  Hemoglobin 13.2 - 17.1 g/dL 16.0 14.0   Hematocrit 38.5 - 50.0 % 49.9 41  Platelets 140 - 400 Thousand/uL 137(L) 149(A)    Lab Results  Component Value Date/Time   VD25OH 20.1 12/14/2018 12:00 AM    Clinical ASCVD: No  The ASCVD Risk score (Alton., et al., 2013) failed to calculate for the following reasons:   The valid total cholesterol range is 130 to 320 mg/dL     Social History   Tobacco Use  Smoking Status Never Smoker  Smokeless Tobacco Never Used   BP Readings from Last 3 Encounters:  03/12/21 130/82  02/27/21 127/70  12/29/20 (!) 150/84   Pulse Readings from Last 3 Encounters:  03/12/21 83  02/27/21 72  12/29/20 96   Wt Readings from Last 3 Encounters:  03/12/21 (!) 314 lb (142.4 kg)  02/27/21 (!) 320 lb 12.8 oz (145.5 kg)  12/29/20 (!) 307 lb (139.3 kg)    Assessment: Review of patient past medical history, allergies, medications, health status, including review of consultants reports, laboratory and other test data, was performed as part of comprehensive evaluation and provision of chronic care management services.   SDOH:  (Social Determinants of Health) assessments and interventions performed: None   CCM Care Plan  Allergies  Allergen Reactions  . Dust Mite Extract     Medications Reviewed Today    Reviewed by Vella Raring, RPH-CPP (Pharmacist) on  04/14/21 at [place] List Status: <None>  Medication Order Taking? Sig Documenting Provider Last Dose Status Informant  aspirin 81 MG EC tablet [id number] Yes Take 81 mg by mouth daily. [provider] Taking Active   atorvastatin (LIPITOR) 20 MG tablet [id number] Yes TAKE 1 TABLET BY MOUTH AT BEDTIME Karamalegos, [name], DO Taking Active   busPIRone (BUSPAR) 10 MG tablet [id number] Yes Take 1 tablet (10 mg total) by mouth 2 (two) times daily. [name], DO Taking Active   celecoxib (CELEBREX) 200 MG capsule [id number] Yes Take 1 capsule (200 mg total) by mouth 2 (two) times daily. [name],  DO Taking Active   cetirizine (ZYRTEC) 10 MG tablet [id number] Yes Take 1 tablet (10 mg total) by mouth at bedtime. [name], DO Taking Active   chlorthalidone (HYGROTON) 25 MG tablet [id number] Yes Take 1 tablet (25 mg total) by mouth daily. [name], DO Taking Active   cholecalciferol (VITAMIN D3) 25 MCG (1000 UNIT) tablet [id number] Yes Take 1,000 Units by mouth daily. [provider] Taking Active   FARXIGA 10 MG TABS tablet [id number] Yes Take 10 mg by mouth daily before breakfast. [name], DO Taking Active   fenofibrate micronized (LOFIBRA) 134 MG capsule [id number] Yes Take 1 capsule (134 mg total) by mouth daily before breakfast. [name], DO Taking Active   finasteride (PROSCAR) 5 MG tablet [id number] Yes Take 1 tablet (5 mg total) by mouth every evening. [name], DO Taking Active   gabapentin (NEURONTIN) 100 MG capsule [id number]  Take 2 capsules by mouth 2 (two) times daily. [provider]  Active   insulin degludec (TRESIBA FLEXTOUCH) 100 UNIT/ML FlexTouch Pen [id number] Yes Inject 66 Units into the skin at bedtime. [provider] Taking Active   Insulin Pen Needle 31G X 5 MM MISC [id number]  Use with insulin to inject into skin 3 times daily as directed [name], DO  Active   Insulin Syringe-Needle U-100 25G X 1" 1 ML MISC [id number]  Use to inject Humalog insulin up to 3 times daily with meal Karamalegos, [name], DO  Active   losartan (COZAAR) 50 MG tablet [id number] Yes Take 1 tablet (50 mg total) by mouth daily. [name], DO Taking Active   metFORMIN (GLUCOPHAGE-XR) 500 MG 24 hr tablet [id number] Yes Take 2 tablets (1,000 mg total) by mouth 2 (two) times daily with a meal. [name], [name], DO Taking Active   Multiple Vitamin (MULTIVITAMIN) capsule [id number] Yes Take 1 capsule by mouth daily. [provider] Taking Active   NOVOLOG  FLEXPEN 100 UNIT/ML FlexPen [id number] Yes Inject 16 Units into the skin 3 (three) times daily with meals. Sliding scale [name], DO Taking Active   Omega-3 Fatty Acids (FISH OIL) 1000 MG CAPS [id number] Yes Take by mouth in the morning and at bedtime. [provider] Taking Active   OZEMPIC, 0.25 OR 0.5 MG/DOSE, 2 MG/1.5ML SOPN [id number] Yes Inject 0.5 mg into the skin once a week. [name], DO Taking Active   pioglitazone (ACTOS) 15 MG tablet [id number] Yes Take 1 tablet (15 mg total) by mouth daily. [name], DO Taking Active   tamsulosin (FLOMAX) 0.4 MG CAPS capsule [id number] No TAKE ONE CAPSULE BY MOUTH EVERY NIGHT AT BEDTIME FOR URINE RETENTION  Patient not taking: Reported on 04/14/2021   [name], DO Not Taking Active   traZODone (DESYREL) 100 MG tablet [id number] Yes  Take 1 tablet (100 mg total) by mouth at bedtime. [name], DO Taking Active   venlafaxine XR (EFFEXOR-XR) 150 MG 24 hr capsule [id number] Yes Take 150 mg by mouth daily with breakfast. [provider] Taking Active           Patient Active Problem List   Diagnosis Date Noted  . Environmental and seasonal allergies 02/27/2021  . Hyperlipidemia associated with type 2 diabetes mellitus ([place]) 02/27/2021  . Morbid obesity with BMI of 40.0-44.9, adult ([place]) 06/26/2020  . Major depressive disorder, recurrent, moderate ([place]) 06/25/2020  . GAD (generalized anxiety disorder) 06/25/2020  . Type 2 diabetes mellitus with other specified complication ([place]) 15/17/6160  . Chronic pain of right knee 05/27/2020  . Primary osteoarthritis involving multiple joints 05/27/2020  . Cataract associated with type 2 diabetes mellitus ([place]) 05/27/2020  . History of torn meniscus of left knee 05/27/2020  . DDD (degenerative disc disease), lumbar 05/27/2020    Immunization History  Administered Date(s) Administered  . Fluad Quad(high Dose 65+)  10/10/2020  . Moderna SARS-COV2 Booster Vaccination 10/18/2020  . Moderna Sars-Covid-2 Vaccination 02/09/2020, 03/13/2020  . Tdap 01/16/2018    Conditions to be addressed/monitored: HTN, HLD and DMII  Care Plan : PharmD - Disease Management T2DM, osteoarthritis  Updates made by Vella Raring, RPH-CPP since 04/14/2021 12:00 AM    Problem: Disease Progression     Long-Range Goal: Disease Progression Prevented or Minimized   Start Date: 11/06/2020  Expected End Date: 01/05/2021  This Visit's Progress: On track  Recent Progress: On track  Priority: High  Note:   Current Barriers:  . Unable to independently afford treatment regimen, particularly when enters coverage gap of [employer] Part D plan o Patient APPROVED for patient assistance from [employer] for [place], [place] through 12/06/2021 o Patient APPROVED for patient assistance from [employer] for [place] through 12/06/2021  Pharmacist Clinical Goal(s):  Marland Kitchen Over the next 60 days, patient will verbalize ability to afford treatment regimen through collaboration with PharmD and provider.   Interventions: . 1:1 collaboration with [name], DO regarding development and update of comprehensive plan of care as evidenced by provider attestation and co-signature . Inter-disciplinary care team collaboration (see longitudinal plan of care) . Comprehensive medication review performed; medication list updated in electronic medical record o Identify patient currently out of tamsulosin. Patient reports received message in January that refill of medication was denied. Per dispensing history in chart, identify Rx for tamsulosin last filled in January was from a previous provider. Note PCP, Dr. [name], sent Rx for tamsulosin to patient's pharmacy for #90 day supply with 1 refill on 12/24/2020 o Patient will contact pharmacy to request refill of tamsulosin Rx as sent by current PCP  Medication  Assistance: . Today patient confirms received Novolog Flexpens from [employer] and is using in place of Novolog vial o Denies questions regarding using Flexpen device  Type 2 Diabetes  . Current treatment ? Tresiba 68 units daily in evening  ? Previous direction from PCP regarding fasting sugar reading in AM: if consistently < 150 reduce by about 2 units per 3 days or more ? Novolog - Reports taking three times daily with meals following sliding scale from previous Endocrinologist in [place] ? Reported sliding scale based on blood sugar prior to each meal: ? If less than 140: takes 13 units ? If 140-200: takes 14 units ? If 200-250: takes 15 units ? If 250 or above: takes 16 units ?  Metformin ER 500 mg - 2 tablets (1000 mg) twice daily ? Pioglitazone 15 mg once daily ? Ozempic 0.5 mg weekly ? Farxiga 10 mg daily  . Lipid management: atorvastatin 20 mg daily . Recalls recent home blood sugars ranging: ~130-170 . Have counseled patient on s/s of low blood sugar and how to treat lows . Have discussed role of diet and exercise on blood sugar control . Have counseled patient on importance of guidance from provider for adjustment of medications; patient has expressed preference for more self-management of his blood sugar control.   Hypertension . Reports taking: o Chlorthalidone 25 mg daily o Losartan 50 mg daily . Note patient monitors home blood pressure with wrist monitor . Denies monitoring home blood pressure recently . Have encouraged patient to obtain upper arm blood pressure monitor for greater accuracy of upper arm vs wrist monitor o Patient denied need for upper arm monitor. States prefers wrist monitor . Encouraged patient to monitor home blood pressure, keep log of results and bring log with him to medical appointments   Patient denies further medication questions/needs or need for scheduled follow up with CM Pharmacist prior to December  Patient Goals/Self-Care  Activities . Over the next 60 days, patient will:  - collaborate with provider on medication access solutions - patient to call office for new or worsening medical concerns - attend medical appointments as scheduled  Follow Up Plan: Telephone follow up appointment with care management team member scheduled for: December    Medication Assistance: Patient APPROVED for patient assistance from [employer] for [place], [place] and Tresiba through 12/06/2021 Patient APPROVED for patient assistance from [employer] for [place] through 12/06/2021  Patient's preferred pharmacy is:  [facility], [place] [address] AT [address] [place] [address] Phone: [phone] Fax: [phone]  Uses pill box? Yes  Follow Up:  Patient agrees to Care Plan and Follow-up.  Plan: Telephone follow up appointment with care management team member scheduled for December The patient has been provided with contact information for the care management team and has been advised to call with any health related questions or concerns.   Harlow Asa, PharmD, [place], CPP Clinical Pharmacist [facility] [phone]

## 2021-04-14 NOTE — Patient Instructions (Signed)
Visit Information  PATIENT GOALS: Goals Addressed            This Visit's Progress   . Pharmacy Goals       Our goal A1c is less than 7%. This corresponds with fasting sugars less than 130 and 2 hour after meal sugars less than 180. Please check your blood sugar and keep a log of the results  Please check your home blood pressure, keep a log of the results and bring this with you to your medical appointments.  Our goal bad cholesterol, or LDL, is less than 70 . This is why it is important to continue taking your atorvastatin.    Harlow Asa, PharmD, [place] [phone]       The patient verbalized understanding of instructions, educational materials, and care plan provided today and declined offer to receive copy of patient instructions, educational materials, and care plan.   Telephone follow up appointment with care management team member scheduled for: December

## 2021-04-16 ENCOUNTER — Telehealth: Payer: Self-pay

## 2021-04-16 NOTE — Telephone Encounter (Signed)
The pt was notified that his Novofine pens needles arrived and are available for him to pick up at his convenience.

## 2021-04-21 ENCOUNTER — Ambulatory Visit (INDEPENDENT_AMBULATORY_CARE_PROVIDER_SITE_OTHER): Payer: Medicare Other | Admitting: Family Medicine

## 2021-04-21 ENCOUNTER — Encounter: Payer: Self-pay | Admitting: Family Medicine

## 2021-04-21 ENCOUNTER — Other Ambulatory Visit: Payer: Self-pay

## 2021-04-21 VITALS — BP 113/70 | HR 92 | Temp 98.4°F | Ht 71.0 in | Wt 316.0 lb

## 2021-04-21 DIAGNOSIS — R361 Hematospermia: Secondary | ICD-10-CM

## 2021-04-21 DIAGNOSIS — N401 Enlarged prostate with lower urinary tract symptoms: Secondary | ICD-10-CM | POA: Diagnosis not present

## 2021-04-21 NOTE — Patient Instructions (Addendum)
Thank you for coming to the office today.  Keep up with Dermatologist in October, they should confirm with you closer to that apt.  Keep apt with me for Physical  The blood seen in the ejaculate is fairly common with enlarged prostate BPH. We can refer to Urologist if this keep happening or new problems within 6 wk to 3 month. Just let me know.  Please schedule a Follow-up Appointment to: Return in about 3 months (around 07/22/2021), or if symptoms worsen or fail to improve, for as needed, keep physical in August.  If you have any other questions or concerns, please feel free to call the office or send a message through [place]. You may also schedule an earlier appointment if necessary.  Additionally, you may be receiving a survey about your experience at our office within a few days to 1 week by e-mail or mail. We value your feedback.  Nobie Putnam, DO [place]

## 2021-04-21 NOTE — Progress Notes (Signed)
Subjective:    Patient ID: Eric Andrews, male    DOB: 1951-01-27, 70 y.o.   MRN: [id number]  Eric Andrews is a 70 y.o. male presenting on 04/21/2021 for hematospermia (X1 month, not painful, bright red blood, has had vasectomy/)   HPI  Hematosperma BPH He has history of vasectomy 30 years ago. He describes new problem today with blood mixed into ejaculate recently after masturbation. Has never had before. Denies any pain or other symptoms. No urinary problems other than usual BPH symptoms -He has not seen Urologist.    Depression screen [facility] 2/9 04/21/2021 02/27/2021 10/10/2020  Decreased Interest 1 1 1   Down, Depressed, Hopeless 1 1 1   PHQ - 2 Score 2 2 2   Altered sleeping 0 2 2  Tired, decreased energy 1 2 2   Change in appetite 0 3 3  Feeling bad or failure about yourself  0 2 2  Trouble concentrating 2 1 1   Moving slowly or fidgety/restless 0 1 2  Suicidal thoughts 0 1 1  PHQ-9 Score 5 14 15   Difficult doing work/chores Not difficult at all Somewhat difficult Somewhat difficult    Social History   Tobacco Use  . Smoking status: Never Smoker  . Smokeless tobacco: Never Used  Vaping Use  . Vaping Use: Never used  Substance Use Topics  . Alcohol use: Not Currently  . Drug use: Never    Review of Systems Per HPI unless specifically indicated above     Objective:    BP 113/70   Pulse 92   Temp 98.4 F (36.9 C) (Oral)   Ht 5\' 11"  (1.803 m)   Wt (!) 316 lb (143.3 kg)   SpO2 98%   BMI 44.07 kg/m   Wt Readings from Last 3 Encounters:  04/21/21 (!) 316 lb (143.3 kg)  03/12/21 (!) 314 lb (142.4 kg)  02/27/21 (!) 320 lb 12.8 oz (145.5 kg)    Physical Exam Vitals and nursing note reviewed.  Constitutional:      General: He is not in acute distress.    Appearance: He is well-developed. He is not diaphoretic.     Comments: Well-appearing, comfortable, cooperative  HENT:     Head: Normocephalic and atraumatic.  Eyes:     General:        Right eye: No discharge.         Left eye: No discharge.     Conjunctiva/sclera: Conjunctivae normal.  Cardiovascular:     Rate and Rhythm: Normal rate.  Pulmonary:     Effort: Pulmonary effort is normal.  Skin:    General: Skin is warm and dry.     Findings: No erythema or rash.  Neurological:     Mental Status: He is alert and oriented to person, place, and time.  Psychiatric:        Behavior: Behavior normal.     Comments: Well groomed, good eye contact, normal speech and thoughts       Results for orders placed or performed in visit on 02/27/21  POCT HgB A1C  Result Value Ref Range   Hemoglobin A1C 8.8 (A) 4.0 - 5.6 %      Assessment & Plan:   Problem List Items Addressed This Visit   None   Visit Diagnoses    Hematospermia    -  Primary   Benign prostatic hyperplasia with lower urinary tract symptoms, symptom details unspecified          BPH Hematospermia Reassurance today, counseling on  cause with BPH most likely. One episode no prior history No other urinary symptoms. No gross hematuria or urinary symptoms of UTI  Will monitor for now, if recurrence or problem recurs within next 4-6 weeks contact us back we can do referral to Urologist, if resolved then no further work up.  Keep apt for physical soon and we will draw PSA lab.  No orders of the defined types were placed in this encounter.    Follow up plan: Return in about 3 months (around 07/22/2021), or if symptoms worsen or fail to improve, for as needed, keep physical in August.  Future labs already ordered.  Eric Andrews, [facility] 04/21/2021, 2:51 PM

## 2021-04-28 DIAGNOSIS — M79602 Pain in left arm: Secondary | ICD-10-CM | POA: Diagnosis not present

## 2021-04-28 DIAGNOSIS — M542 Cervicalgia: Secondary | ICD-10-CM | POA: Diagnosis not present

## 2021-04-28 DIAGNOSIS — G479 Sleep disorder, unspecified: Secondary | ICD-10-CM | POA: Diagnosis not present

## 2021-04-28 DIAGNOSIS — M545 Low back pain, unspecified: Secondary | ICD-10-CM | POA: Diagnosis not present

## 2021-04-28 DIAGNOSIS — G4733 Obstructive sleep apnea (adult) (pediatric): Secondary | ICD-10-CM | POA: Diagnosis not present

## 2021-04-28 DIAGNOSIS — R200 Anesthesia of skin: Secondary | ICD-10-CM | POA: Diagnosis not present

## 2021-05-10 DIAGNOSIS — G4733 Obstructive sleep apnea (adult) (pediatric): Secondary | ICD-10-CM | POA: Diagnosis not present

## 2021-05-12 ENCOUNTER — Ambulatory Visit (INDEPENDENT_AMBULATORY_CARE_PROVIDER_SITE_OTHER): Payer: Medicare Other | Admitting: Pharmacist

## 2021-05-12 DIAGNOSIS — Z794 Long term (current) use of insulin: Secondary | ICD-10-CM | POA: Diagnosis not present

## 2021-05-12 DIAGNOSIS — E1169 Type 2 diabetes mellitus with other specified complication: Secondary | ICD-10-CM | POA: Diagnosis not present

## 2021-05-12 NOTE — Chronic Care Management (AMB) (Signed)
Chronic Care Management Pharmacy Note  05/12/2021 Name:  Eric Andrews MRN:  [id number] DOB:  [birth year]   Subjective: Eric Andrews is an 70 y.o. year old male who is a primary patient of [name], DO.  The CCM team was consulted for assistance with disease management and care coordination needs.    Engaged with patient by telephone for follow up visit in response to provider referral for pharmacy case management and/or care coordination services.   Consent to Services:  The patient was given information about Chronic Care Management services, agreed to services, and gave verbal consent prior to initiation of services.  Please see initial visit note for detailed documentation.   Patient Care Team: [name], DO as PCP - General (Family Medicine) Winfield Cunas Virl Diamond, RPH-CPP (Pharmacist)  Recent office visits: Office Visit with PCP on 5/16 for hematospermia  Hospital visits: None in previous 6 months  Objective:  Lab Results  Component Value Date   CREATININE 1.08 06/18/2020   CREATININE 1.1 12/19/2019    Lab Results  Component Value Date   HGBA1C 8.8 (A) 02/27/2021   Last diabetic Eye exam:  Lab Results  Component Value Date/Time   HMDIABEYEEXA No Retinopathy 07/16/2020 12:00 AM    Last diabetic Foot exam: No results found for: HMDIABFOOTEX      Component Value Date/Time   CHOL 114 06/18/2020 0857   TRIG 181 (H) 06/18/2020 0857   HDL 34 (L) 06/18/2020 0857   CHOLHDL 3.4 06/18/2020 0857   LDLCALC 54 06/18/2020 0857    Hepatic Function Latest Ref Rng & Units 06/18/2020 12/19/2019  Total Protein 6.1 - 8.1 g/dL 6.2 -  Albumin 3.5 - 5.0 - 4.6  AST 10 - 35 U/L 39(H) 38  ALT 9 - 46 U/L 61(H) 62(A)  Alk Phosphatase 25 - 125 - 37  Total Bilirubin 0.2 - 1.2 mg/dL 0.6 -    Social History   Tobacco Use  Smoking Status Never Smoker  Smokeless Tobacco Never Used   BP Readings from Last 3 Encounters:  04/21/21 113/70  03/12/21 130/82   02/27/21 127/70   Pulse Readings from Last 3 Encounters:  04/21/21 92  03/12/21 83  02/27/21 72   Wt Readings from Last 3 Encounters:  04/21/21 (!) 316 lb (143.3 kg)  03/12/21 (!) 314 lb (142.4 kg)  02/27/21 (!) 320 lb 12.8 oz (145.5 kg)    Assessment: Review of patient past medical history, allergies, medications, health status, including review of consultants reports, laboratory and other test data, was performed as part of comprehensive evaluation and provision of chronic care management services.   SDOH:  (Social Determinants of Health) assessments and interventions performed: none   CCM Care Plan  Allergies  Allergen Reactions  . Dust Mite Extract     Medications Reviewed Today    Reviewed by [name], DO (Physician) on 04/21/21 at 1451  Med List Status: <None>  Medication Order Taking? Sig Documenting Provider Last Dose Status Informant  aspirin 81 MG EC tablet [id number] Yes Take 81 mg by mouth daily. [provider] Taking Active   atorvastatin (LIPITOR) 20 MG tablet [id number] Yes TAKE 1 TABLET BY MOUTH AT BEDTIME Karamalegos, [name], DO Taking Active   busPIRone (BUSPAR) 10 MG tablet [id number] Yes Take 1 tablet (10 mg total) by mouth 2 (two) times daily. [name], DO Taking Active   celecoxib (CELEBREX) 200 MG capsule [id number] Yes Take 1 capsule (200 mg total) by mouth 2 (  two) times daily. [name], DO Taking Active   cetirizine (ZYRTEC) 10 MG tablet [id number] Yes Take 1 tablet (10 mg total) by mouth at bedtime. [name], DO Taking Active   chlorthalidone (HYGROTON) 25 MG tablet [id number] Yes Take 1 tablet (25 mg total) by mouth daily. [name], DO Taking Active   cholecalciferol (VITAMIN D3) 25 MCG (1000 UNIT) tablet [id number] Yes Take 1,000 Units by mouth daily. [provider] Taking Active   FARXIGA 10 MG TABS tablet [id number] Yes Take 10 mg by mouth daily before  breakfast. [name], DO Taking Active   fenofibrate micronized (LOFIBRA) 134 MG capsule [id number] Yes Take 1 capsule (134 mg total) by mouth daily before breakfast. [name], DO Taking Active   finasteride (PROSCAR) 5 MG tablet [id number] Yes Take 1 tablet (5 mg total) by mouth every evening. [name], DO Taking Active   gabapentin (NEURONTIN) 400 MG capsule [id number] Yes Take 1 capsule by mouth 3 (three) times daily. [provider] Taking Active   insulin degludec (TRESIBA FLEXTOUCH) 100 UNIT/ML FlexTouch Pen [id number] Yes Inject 68 Units into the skin at bedtime. [provider] Taking Active   Insulin Pen Needle 31G X 5 MM MISC [id number] Yes Use with insulin to inject into skin 3 times daily as directed [name], DO Taking Active   Insulin Syringe-Needle U-100 25G X 1" 1 ML MISC [id number] Yes Use to inject Humalog insulin up to 3 times daily with meal Karamalegos, [name], DO Taking Active   losartan (COZAAR) 50 MG tablet [id number] Yes Take 1 tablet (50 mg total) by mouth daily. [name], DO Taking Active   metFORMIN (GLUCOPHAGE-XR) 500 MG 24 hr tablet [id number] Yes Take 2 tablets (1,000 mg total) by mouth 2 (two) times daily with a meal. [name], [name], DO Taking Active   Multiple Vitamin (MULTIVITAMIN) capsule [id number] Yes Take 1 capsule by mouth daily. [provider] Taking Active   NOVOLOG FLEXPEN 100 UNIT/ML FlexPen [id number] Yes Inject 14 Units into the skin 3 (three) times daily with meals. Sliding scale [name], DO Taking Active   Omega-3 Fatty Acids (FISH OIL) 1000 MG CAPS [id number] Yes Take by mouth in the morning and at bedtime. [provider] Taking Active   OZEMPIC, 0.25 OR 0.5 MG/DOSE, 2 MG/1.5ML SOPN [id number] Yes Inject 0.5 mg into the skin once a week. [name], DO Taking Active   pioglitazone (ACTOS) 15 MG tablet  [id number] Yes Take 1 tablet (15 mg total) by mouth daily. [name], DO Taking Active   tamsulosin (FLOMAX) 0.4 MG CAPS capsule [id number] Yes TAKE ONE CAPSULE BY MOUTH EVERY NIGHT AT BEDTIME FOR URINE RETENTION [name], DO Taking Active   traZODone (DESYREL) 100 MG tablet [id number] Yes Take 1 tablet (100 mg total) by mouth at bedtime. [name], DO Taking Active   venlafaxine XR (EFFEXOR-XR) 150 MG 24 hr capsule [id number] Yes Take 150 mg by mouth daily with breakfast. [provider] Taking Active           Patient Active Problem List   Diagnosis Date Noted  . Environmental and seasonal allergies 02/27/2021  . Hyperlipidemia associated with type 2 diabetes mellitus ([place]) 02/27/2021  . Morbid obesity with BMI of 40.0-44.9, adult ([place]) 06/26/2020  . Major depressive disorder, recurrent, moderate ([place]) 06/25/2020  . GAD (generalized anxiety disorder) 06/25/2020  . Type 2 diabetes mellitus with other specified complication (  [place]) 05/27/2020  . Chronic pain of right knee 05/27/2020  . Primary osteoarthritis involving multiple joints 05/27/2020  . Cataract associated with type 2 diabetes mellitus ([place]) 05/27/2020  . History of torn meniscus of left knee 05/27/2020  . DDD (degenerative disc disease), lumbar 05/27/2020    Immunization History  Administered Date(s) Administered  . Fluad Quad(high Dose 65+) 10/10/2020  . Moderna SARS-COV2 Booster Vaccination 10/18/2020  . Moderna Sars-Covid-2 Vaccination 02/09/2020, 03/13/2020  . Tdap 01/16/2018    Conditions to be addressed/monitored: HTN, HLD and DMII  Care Plan : PharmD - Disease Management T2DM, osteoarthritis  Updates made by Vella Raring, RPH-CPP since 05/12/2021 12:00 AM    Problem: Disease Progression     Long-Range Goal: Disease Progression Prevented or Minimized   Start Date: 11/06/2020  Expected End Date: 01/05/2021  This Visit's Progress: On track  Recent  Progress: On track  Priority: High  Note:   Current Barriers:  . Unable to independently afford treatment regimen, particularly when enters coverage gap of [employer] Part D plan o Patient APPROVED for patient assistance from [employer] for [place], [place] through 12/06/2021 o Patient APPROVED for patient assistance from [employer] for [place] through 12/06/2021  Pharmacist Clinical Goal(s):  Marland Kitchen Over the next 60 days, patient will verbalize ability to afford treatment regimen through collaboration with PharmD and provider.   Interventions: . 1:1 collaboration with [name], DO regarding development and update of comprehensive plan of care as evidenced by provider attestation and co-signature . Inter-disciplinary care team collaboration (see longitudinal plan of care) . Perform chart review. Note patient seen for Office Visit with PCP on 5/16 for hematospermia  Medication Assistance: . Receive message from Loni Muse, [place] with office. Reports patient received Novolog vials shipped to office by [employer] patient assistance program.  o Note PCP sent new Rx to patient assistance program in April for patient to receive Novolog Flexpen, rather than Novolog vials. Patient confirmed receiving Novolog Flexpens from program during call with CM Pharmacist on 5/9 . Today [facility] CPhT Sharee Pimple [place] places call to [employer] patient assistance.  o Advises spoke with representative Freda Munro at [employer]. Freda Munro will remove the Novolog Vials from auto refill and will keep the Novolog flex pens on auto refills (next shipment for the Novolog flex pens is coming out around 1st of July) . Follow up with Mr. Bonser today. Advise patient of above information from [facility] CPhT/Novo Nordisk program. o Patient confirms will not use Novolog vial and confirms has sufficient supply of Novolog flexpens to last until next scheduled refill date  Type 2 Diabetes  . Current  treatment ? Tresiba 68 units daily in evening  ? Previous direction from PCP regarding fasting sugar reading in AM: if consistently < 150 reduce by about 2 units per 3 days or more ? Novolog - Reported taking three times daily with meals following sliding scale from previous Endocrinologist in [place] ? Reported sliding scale based on blood sugar prior to each meal: ? If less than 140: takes 13 units ? If 140-200: takes 14 units ? If 200-250: takes 15 units ? If 250 or above: takes 16 units ? Metformin ER 500 mg - 2 tablets (1000 mg) twice daily ? Pioglitazone 15 mg once daily ? Ozempic 0.5 mg weekly ? Farxiga 10 mg daily  . Lipid management: atorvastatin 20 mg daily . Recalls recent home blood sugars ranging: ~101-170 . Have counseled patient on s/s of low blood  sugar and how to treat lows . Have discussed role of diet and exercise on blood sugar control . Have counseled patient on importance of guidance from provider for adjustment of medications; patient has expressed preference for more self-management of his blood sugar control.    Patient denies further medication questions/needs or need for scheduled follow up with CM Pharmacist prior to December  Patient Goals/Self-Care Activities . Over the next 60 days, patient will:  - collaborate with provider on medication access solutions - patient to call office for new or worsening medical concerns - attend medical appointments as scheduled  Follow Up Plan: Telephone follow up appointment with care management team member scheduled for: December     Patient's preferred pharmacy is:  [facility] [place], [place] - [address] AT [address] [address] Phone: [phone] Fax: [phone]   Follow Up:  Patient agrees to Care Plan and Follow-up.  Harlow Asa, PharmD, [place], CPP Clinical Pharmacist [facility] [phone]

## 2021-05-12 NOTE — Patient Instructions (Signed)
Visit Information  PATIENT GOALS: Goals Addressed            This Visit's Progress   . Pharmacy Goals       Our goal A1c is less than 7%. This corresponds with fasting sugars less than 130 and 2 hour after meal sugars less than 180. Please check your blood sugar and keep a log of the results  Please check your home blood pressure, keep a log of the results and bring this with you to your medical appointments.  Our goal bad cholesterol, or LDL, is less than 70 . This is why it is important to continue taking your atorvastatin.    Harlow Asa, PharmD, [place] [phone]       The patient verbalized understanding of instructions, educational materials, and care plan provided today and declined offer to receive copy of patient instructions, educational materials, and care plan.   Telephone follow up appointment with care management team member scheduled for: December

## 2021-05-13 DIAGNOSIS — M9931 Osseous stenosis of neural canal of cervical region: Secondary | ICD-10-CM | POA: Diagnosis not present

## 2021-05-13 DIAGNOSIS — M5412 Radiculopathy, cervical region: Secondary | ICD-10-CM | POA: Diagnosis not present

## 2021-05-13 DIAGNOSIS — G8929 Other chronic pain: Secondary | ICD-10-CM | POA: Diagnosis not present

## 2021-05-13 DIAGNOSIS — M542 Cervicalgia: Secondary | ICD-10-CM | POA: Diagnosis not present

## 2021-05-13 DIAGNOSIS — M5441 Lumbago with sciatica, right side: Secondary | ICD-10-CM | POA: Diagnosis not present

## 2021-05-13 DIAGNOSIS — M5442 Lumbago with sciatica, left side: Secondary | ICD-10-CM | POA: Diagnosis not present

## 2021-05-23 ENCOUNTER — Other Ambulatory Visit: Payer: Self-pay | Admitting: Family Medicine

## 2021-05-23 DIAGNOSIS — E1169 Type 2 diabetes mellitus with other specified complication: Secondary | ICD-10-CM

## 2021-05-23 DIAGNOSIS — Z794 Long term (current) use of insulin: Secondary | ICD-10-CM

## 2021-06-09 DIAGNOSIS — G4733 Obstructive sleep apnea (adult) (pediatric): Secondary | ICD-10-CM | POA: Diagnosis not present

## 2021-06-10 ENCOUNTER — Other Ambulatory Visit: Payer: Self-pay | Admitting: Family Medicine

## 2021-06-10 DIAGNOSIS — F411 Generalized anxiety disorder: Secondary | ICD-10-CM

## 2021-07-01 ENCOUNTER — Other Ambulatory Visit: Payer: Self-pay

## 2021-07-01 ENCOUNTER — Other Ambulatory Visit: Payer: Medicare Other

## 2021-07-01 DIAGNOSIS — I10 Essential (primary) hypertension: Secondary | ICD-10-CM | POA: Diagnosis not present

## 2021-07-01 DIAGNOSIS — Z794 Long term (current) use of insulin: Secondary | ICD-10-CM

## 2021-07-01 DIAGNOSIS — F411 Generalized anxiety disorder: Secondary | ICD-10-CM

## 2021-07-01 DIAGNOSIS — R351 Nocturia: Secondary | ICD-10-CM

## 2021-07-01 DIAGNOSIS — E785 Hyperlipidemia, unspecified: Secondary | ICD-10-CM | POA: Diagnosis not present

## 2021-07-01 DIAGNOSIS — E1169 Type 2 diabetes mellitus with other specified complication: Secondary | ICD-10-CM | POA: Diagnosis not present

## 2021-07-01 DIAGNOSIS — Z0000 Encounter for general adult medical examination without abnormal findings: Secondary | ICD-10-CM

## 2021-07-01 DIAGNOSIS — F331 Major depressive disorder, recurrent, moderate: Secondary | ICD-10-CM

## 2021-07-02 ENCOUNTER — Other Ambulatory Visit: Payer: Self-pay | Admitting: Family Medicine

## 2021-07-02 DIAGNOSIS — R350 Frequency of micturition: Secondary | ICD-10-CM

## 2021-07-02 DIAGNOSIS — N401 Enlarged prostate with lower urinary tract symptoms: Secondary | ICD-10-CM

## 2021-07-02 LAB — CBC WITH DIFFERENTIAL/PLATELET
Absolute Monocytes: 557 cells/uL (ref 200–950)
Basophils Absolute: 80 cells/uL (ref 0–200)
Basophils Relative: 1.5 %
Eosinophils Absolute: 292 cells/uL (ref 15–500)
Eosinophils Relative: 5.5 %
HCT: 51.4 % — ABNORMAL HIGH (ref 38.5–50.0)
Hemoglobin: 16.1 g/dL (ref 13.2–17.1)
Lymphs Abs: 1601 cells/uL (ref 850–3900)
MCH: 27.5 pg (ref 27.0–33.0)
MCHC: 31.3 g/dL — ABNORMAL LOW (ref 32.0–36.0)
MCV: 87.7 fL (ref 80.0–100.0)
MPV: 11.3 fL (ref 7.5–12.5)
Monocytes Relative: 10.5 %
Neutro Abs: 2772 cells/uL (ref 1500–7800)
Neutrophils Relative %: 52.3 %
Platelets: 157 10*3/uL (ref 140–400)
RBC: 5.86 10*6/uL — ABNORMAL HIGH (ref 4.20–5.80)
RDW: 12.6 % (ref 11.0–15.0)
Total Lymphocyte: 30.2 %
WBC: 5.3 10*3/uL (ref 3.8–10.8)

## 2021-07-02 LAB — LIPID PANEL
Cholesterol: 122 mg/dL (ref <200)
HDL: 33 mg/dL — ABNORMAL LOW (ref >=40)
LDL Cholesterol (Calc): 62 mg/dL (calc)
Non-HDL Cholesterol (Calc): 89 mg/dL (calc) (ref <130)
Total CHOL/HDL Ratio: 3.7 (calc) (ref <5.0)
Triglycerides: 209 mg/dL — ABNORMAL HIGH (ref <150)

## 2021-07-02 LAB — PSA: PSA: 0.1 ng/mL (ref <=4.00)

## 2021-07-02 LAB — COMPLETE METABOLIC PANEL WITH GFR
AG Ratio: 2.1 (calc) (ref 1.0–2.5)
ALT: 58 U/L — ABNORMAL HIGH (ref 9–46)
AST: 37 U/L — ABNORMAL HIGH (ref 10–35)
Albumin: 4.4 g/dL (ref 3.6–5.1)
Alkaline phosphatase (APISO): 27 U/L — ABNORMAL LOW (ref 35–144)
BUN/Creatinine Ratio: 25 (calc) — ABNORMAL HIGH (ref 6–22)
BUN: 28 mg/dL — ABNORMAL HIGH (ref 7–25)
CO2: 30 mmol/L (ref 20–32)
Calcium: 9.8 mg/dL (ref 8.6–10.3)
Chloride: 99 mmol/L (ref 98–110)
Creat: 1.12 mg/dL (ref 0.70–1.35)
Globulin: 2.1 g/dL (calc) (ref 1.9–3.7)
Glucose, Bld: 185 mg/dL — ABNORMAL HIGH (ref 65–99)
Potassium: 4 mmol/L (ref 3.5–5.3)
Sodium: 138 mmol/L (ref 135–146)
Total Bilirubin: 0.6 mg/dL (ref 0.2–1.2)
Total Protein: 6.5 g/dL (ref 6.1–8.1)
eGFR: 71 mL/min/{1.73_m2} (ref >=60)

## 2021-07-02 LAB — TSH: TSH: 2.25 mIU/L (ref 0.40–4.50)

## 2021-07-02 LAB — HEMOGLOBIN A1C
Hgb A1c MFr Bld: 8.5 % of total Hgb — ABNORMAL HIGH (ref <5.7)
Mean Plasma Glucose: 197 mg/dL
eAG (mmol/L): 10.9 mmol/L

## 2021-07-02 NOTE — Telephone Encounter (Signed)
Requested medications are due for refill today.  yes  Requested medications are on the active medications list.  yes  Last refill. 07/01/2020  Future visit scheduled.   yes  Notes to clinic.  Prescription is expired.

## 2021-07-08 ENCOUNTER — Other Ambulatory Visit: Payer: Self-pay

## 2021-07-08 ENCOUNTER — Encounter: Payer: Self-pay | Admitting: Family Medicine

## 2021-07-08 ENCOUNTER — Ambulatory Visit (INDEPENDENT_AMBULATORY_CARE_PROVIDER_SITE_OTHER): Payer: Medicare Other | Admitting: Family Medicine

## 2021-07-08 VITALS — BP 91/43 | HR 73 | Ht 71.0 in | Wt 315.0 lb

## 2021-07-08 DIAGNOSIS — E785 Hyperlipidemia, unspecified: Secondary | ICD-10-CM

## 2021-07-08 DIAGNOSIS — F411 Generalized anxiety disorder: Secondary | ICD-10-CM

## 2021-07-08 DIAGNOSIS — Z0000 Encounter for general adult medical examination without abnormal findings: Secondary | ICD-10-CM

## 2021-07-08 DIAGNOSIS — F331 Major depressive disorder, recurrent, moderate: Secondary | ICD-10-CM

## 2021-07-08 DIAGNOSIS — Z794 Long term (current) use of insulin: Secondary | ICD-10-CM

## 2021-07-08 DIAGNOSIS — E1136 Type 2 diabetes mellitus with diabetic cataract: Secondary | ICD-10-CM

## 2021-07-08 DIAGNOSIS — E1169 Type 2 diabetes mellitus with other specified complication: Secondary | ICD-10-CM

## 2021-07-08 DIAGNOSIS — Z6841 Body Mass Index (BMI) 40.0 and over, adult: Secondary | ICD-10-CM

## 2021-07-08 DIAGNOSIS — Z9189 Other specified personal risk factors, not elsewhere classified: Secondary | ICD-10-CM | POA: Diagnosis not present

## 2021-07-08 DIAGNOSIS — K219 Gastro-esophageal reflux disease without esophagitis: Secondary | ICD-10-CM | POA: Diagnosis not present

## 2021-07-08 MED ORDER — OMEPRAZOLE 20 MG PO CPDR: 20.0000 mg | DELAYED_RELEASE_CAPSULE | Freq: Every day | ORAL | 3 refills | Status: DC

## 2021-07-08 NOTE — Patient Instructions (Addendum)
Thank you for coming to the office today.  Recommend return for Pneumonia vaccine Prevnar-20 when we have back in stock, likely by Friday if you want to call and request a NURSE ONLY Vaccine apt - you can message to ask if we have it in stock first.  We will notify Grayland Ormond to increase dose Ozempic from 0.5 up to '1mg'$  new dose in future weekly, we would need to reduce insulin in future.  -------  [place] [address] [place], [place] [address] Phone: [place] Address: [address] [place], [place] [address] [email or web address] Phone: [phone]  [place] [place] ([place] [facility]) Address: [address], [place], [place] [address] Hours: 8:30AM-5PM Phone: [phone]  [place] at [place], [place] [address] Phone: [phone]  [facility] (All ages) [address] [place], [place]:[id number] Phone: [phone] (Option 1) [email or web address]  ----------------------------------------------------------------- THERAPIST ONLY   [facility] [id number], [place] [address] Johnnette Litter P: [phone]  [facility] [place], [address], [place]: [place].   Address: [place], [place], [place] [address] Hours: Open today  9AM-7PM Phone: [phone]  [facility] [address] [place] Address: [address] [place], [place], [place] [address] Phone: [phone]      Please schedule a Follow-up Appointment to: Return in about 3 months (around 10/08/2021) for 3 month follow-up DM A1c, mood PHQ therapist update.  If you have any other questions or concerns, please feel free to call the office or send a message through [place]. You may also  schedule an earlier appointment if necessary.  Additionally, you may be receiving a survey about your experience at our office within a few days to 1 week by e-mail or mail. We value your feedback.  Nobie Putnam, DO [place]

## 2021-07-08 NOTE — Assessment & Plan Note (Signed)
Chronic problems Worse with stress eating and anxiety now Will give handout of therapist options and he can locate one in network accepting patients we can refer if he request

## 2021-07-08 NOTE — Progress Notes (Signed)
Subjective:    Patient ID: Eric Andrews, male    DOB: [birth year], 70 y.o.   MRN: [id number]  Eric Andrews is a 70 y.o. male presenting on 07/08/2021 for Annual Exam and Diabetes   HPI  Here for Annual Physical and Lab Review  Major Depression, recurrent moderate Generalized Anxiety Disorder Oldest daughter recently passed after terminal cancer. He has been coping and managing this for some time now, he is optimistic on improving his mental health moving forward. Continues on Buspar 33m BID, feels less effective On Venlafaxine 1542mdaily  Elevated LFTs Last lab also had elevated liver enzymes, mild elevated   CHRONIC DM, Type 2 Last A1c 8.5, previous 8.8 [facility] [employer] manufacturer admits overeating at times. Stress eating - interested in psychiatry or therapist Meds: Humalog TID SSI with meals, Tresiba 68 units daily, Jardiance 2548mPioglitazone 57m64mON ozempic 0.5mg 60mkly Reports  good compliance. Tolerating well w/o side-effects Currently on ARB Completed DM EYe exam Denies hypoglycemia, polyuria, visual changes, numbness or tingling   [facility] balance issue, especially with lower extremity, with history of spinal DDD issues.  BPH LUTS Improved on Tamsulosin  GERD Dentist said teeth torn down by acid, and normal wear, he is interested in GERD therapy. He admits heartburn episodic.  Health Maintenance: PSA 0.10 negative  COVID19 vaccine booster updated x2   Completed Colonoscopy 02/2018, next due 2024  Due Prevnar 20  out of stock, will return  Depression screen [facility] 04/21/2021 02/27/2021  Decreased Interest _0 Down, Depressed, Hopeless _1 PHQ - 2 Score _2 Altered sleeping 1 0 2  Tired, decreased energy _3 Change in appetite 3 0 3  Feeling bad or failure about yourself  2 0 2  Trouble concentrating _4 Moving slowly or fidgety/restless 2 0 1  Suicidal  thoughts 1 0 1  PHQ-9 Score _5 Difficult doing work/chores Very difficult Not difficult at all Somewhat difficult    GAD 7 : Generalized Anxiety Score 07/08/2021 04/21/2021 10/10/2020 09/02/2020  Nervous, Anxious, on Edge 2 0 3 3  Control/stop worrying _6 Worry too much - different things _7 Trouble relaxing 1 0 3 3  Restless 0 0 1 1  Easily annoyed or irritable 1 0 3 3  Afraid - awful might happen 0 0 2 2  Total GAD 7 Score _8 Anxiety Difficulty Very difficult - Somewhat difficult Very difficult     Past Medical History:  Diagnosis Date   ADD (attention deficit disorder)    Anxiety    Depression    Diabetes mellitus without complication (HCC)    Enlarged prostate    Knee pain    Sleep apnea    Past Surgical History:  Procedure Laterality Date   APPENDECTOMY     HERNIA REPAIR     Left   KNEE ARTHROSCOPY     Left   Social History   Socioeconomic History   Marital status: Married    Spouse name: Not on file   Number of children: Not on file   Years of education: Not on file   Highest education level: Not on file  Occupational History   Occupation: retired  Tobacco Use   Smoking status: Never   Smokeless tobacco: Never  Vaping Use  Vaping Use: Never used  Substance and Sexual Activity   Alcohol use: Not Currently   Drug use: Never   Sexual activity: Not Currently  Other Topics Concern   Not on file  Social History Narrative   Not on file   Social Determinants of Health   Financial Resource Strain: Low Risk    Difficulty of Paying Living Expenses: Not hard at all  Food Insecurity: No Food Insecurity   Worried About Charity fundraiser in the Last Year: Never true   [place] in the Last Year: Never true  Transportation Needs: No Transportation Needs   Lack of Transportation (Medical): No   Lack of Transportation (Non-Medical): No  Physical Activity: Insufficiently Active   Days of Exercise per Week: 7 days   Minutes of  Exercise per Session: 20 min  Stress: No Stress Concern Present   Feeling of Stress : Not at all  Social Connections: Not on file  Intimate Partner Violence: Not on file   Family History  Problem Relation Age of Onset   Heart attack Mother 77   Lung cancer Father    Alzheimer's disease Brother    Heart attack Maternal Grandfather 46   Current Outpatient Medications on File Prior to Visit  Medication Sig   aspirin 81 MG EC tablet Take 81 mg by mouth daily.   atorvastatin (LIPITOR) 20 MG tablet TAKE 1 TABLET BY MOUTH AT BEDTIME   busPIRone (BUSPAR) 10 MG tablet Take 1 tablet (10 mg total) by mouth 2 (two) times daily.   celecoxib (CELEBREX) 200 MG capsule Take 1 capsule (200 mg total) by mouth 2 (two) times daily.   cetirizine (ZYRTEC) 10 MG tablet Take 1 tablet (10 mg total) by mouth at bedtime.   chlorthalidone (HYGROTON) 25 MG tablet Take 1 tablet (25 mg total) by mouth daily.   cholecalciferol (VITAMIN D3) 25 MCG (1000 UNIT) tablet Take 1,000 Units by mouth daily.   FARXIGA 10 MG TABS tablet Take 10 mg by mouth daily before breakfast.   fenofibrate micronized (LOFIBRA) 134 MG capsule Take 1 capsule (134 mg total) by mouth daily before breakfast.   finasteride (PROSCAR) 5 MG tablet TAKE 1 TABLET(5 MG) BY MOUTH EVERY EVENING   gabapentin (NEURONTIN) 400 MG capsule Take 1 capsule by mouth 3 (three) times daily.   insulin degludec (TRESIBA FLEXTOUCH) 100 UNIT/ML FlexTouch Pen Inject 68 Units into the skin at bedtime.   Insulin Pen Needle 31G X 5 MM MISC Use with insulin to inject into skin 3 times daily as directed   losartan (COZAAR) 50 MG tablet Take 1 tablet (50 mg total) by mouth daily.   metFORMIN (GLUCOPHAGE-XR) 500 MG 24 hr tablet Take 2 tablets (1,000 mg total) by mouth 2 (two) times daily with a meal.   Multiple Vitamin (MULTIVITAMIN) capsule Take 1 capsule by mouth daily.   NOVOLOG FLEXPEN 100 UNIT/ML FlexPen Inject 14 Units into the skin 3 (three) times daily with meals.  Sliding scale   Omega-3 Fatty Acids (FISH OIL) 1000 MG CAPS Take by mouth in the morning and at bedtime.   OZEMPIC, 0.25 OR 0.5 MG/DOSE, 2 MG/1.5ML SOPN Inject 0.5 mg into the skin once a week.   pioglitazone (ACTOS) 15 MG tablet TAKE 1 TABLET(15 MG) BY MOUTH DAILY   tamsulosin (FLOMAX) 0.4 MG CAPS capsule TAKE ONE CAPSULE BY MOUTH EVERY NIGHT AT BEDTIME FOR URINE RETENTION   traZODone (DESYREL) 100 MG tablet Take 1 tablet (100 mg total) by  mouth at bedtime.   venlafaxine XR (EFFEXOR-XR) 150 MG 24 hr capsule Take 150 mg by mouth daily with breakfast.   Insulin Syringe-Needle U-100 25G X 1" 1 ML MISC Use to inject Humalog insulin up to 3 times daily with meal   No current facility-administered medications on file prior to visit.    Review of Systems  Constitutional:  Negative for activity change, appetite change, chills, diaphoresis, fatigue and fever.  HENT:  Negative for congestion and hearing loss.   Eyes:  Negative for visual disturbance.  Respiratory:  Negative for cough, chest tightness, shortness of breath and wheezing.   Cardiovascular:  Negative for chest pain, palpitations and leg swelling.  Gastrointestinal:  Negative for abdominal pain, constipation, diarrhea, nausea and vomiting.  Genitourinary:  Negative for dysuria, frequency and hematuria.  Musculoskeletal:  Negative for arthralgias and neck pain.  Skin:  Negative for rash.  Neurological:  Negative for dizziness, weakness, light-headedness, numbness and headaches.  Hematological:  Negative for adenopathy.  Psychiatric/Behavioral:  Negative for behavioral problems, dysphoric mood and sleep disturbance.   Per HPI unless specifically indicated above      Objective:    BP (!) 91/43   Pulse 73   Ht _0  (1.803 m)   Wt (!) 315 lb (142.9 kg)   SpO2 97%   BMI 43.93 kg/m   Wt Readings from Last 3 Encounters:  07/08/21 (!) 315 lb (142.9 kg)  04/21/21 (!) 316 lb (143.3 kg)  03/12/21 (!) 314 lb (142.4 kg)    Physical  Exam Vitals and nursing note reviewed.  Constitutional:      General: He is not in acute distress.    Appearance: He is well-developed. He is obese. He is not diaphoretic.     Comments: Well-appearing, comfortable, cooperative  HENT:     Head: Normocephalic and atraumatic.  Eyes:     General:        Right eye: No discharge.        Left eye: No discharge.     Conjunctiva/sclera: Conjunctivae normal.     Pupils: Pupils are equal, round, and reactive to light.  Neck:     Thyroid: No thyromegaly.  Cardiovascular:     Rate and Rhythm: Normal rate and regular rhythm.     Pulses: Normal pulses.     Heart sounds: Normal heart sounds. No murmur heard. Pulmonary:     Effort: Pulmonary effort is normal. No respiratory distress.     Breath sounds: Normal breath sounds. No wheezing or rales.  Abdominal:     General: Bowel sounds are normal. There is no distension.     Palpations: Abdomen is soft. There is no mass.     Tenderness: There is no abdominal tenderness.  Musculoskeletal:        General: No tenderness. Normal range of motion.     Cervical back: Normal range of motion and neck supple.     Right lower leg: No edema.     Left lower leg: No edema.     Comments: Upper / Lower Extremities: - Normal muscle tone, strength bilateral upper extremities 5/5, lower extremities 5/5  Lymphadenopathy:     Cervical: No cervical adenopathy.  Skin:    General: Skin is warm and dry.     Findings: No erythema or rash.  Neurological:     Mental Status: He is alert and oriented to person, place, and time.     Comments: Distal sensation intact to light touch all extremities  Psychiatric:        Mood and Affect: Mood normal.        Behavior: Behavior normal.        Thought Content: Thought content normal.     Comments: Well groomed, good eye contact, normal speech and thoughts    Diabetic Foot Exam - Simple   Simple Foot Form Diabetic Foot exam was performed with the following findings: Yes  07/08/2021  9:52 AM  Visual Inspection No deformities, no ulcerations, no other skin breakdown bilaterally: Yes Sensation Testing Intact to touch and monofilament testing bilaterally: Yes Pulse Check Posterior Tibialis and Dorsalis pulse intact bilaterally: Yes Comments      Results for orders placed or performed in visit on 07/01/21  TSH  Result Value Ref Range   TSH 2.25 0.40 - 4.50 mIU/L  PSA  Result Value Ref Range   PSA 0.10 < OR = 4.00 ng/mL  Lipid panel  Result Value Ref Range   Cholesterol 122 <200 mg/dL   HDL 33 (L) > OR = 40 mg/dL   Triglycerides 209 (H) <150 mg/dL   LDL Cholesterol (Calc) 62 mg/dL (calc)   Total CHOL/HDL Ratio 3.7 <5.0 (calc)   Non-HDL Cholesterol (Calc) 89 <130 mg/dL (calc)  COMPLETE METABOLIC PANEL WITH GFR  Result Value Ref Range   Glucose, Bld 185 (H) 65 - 99 mg/dL   BUN 28 (H) 7 - 25 mg/dL   Creat 1.12 0.70 - 1.35 mg/dL   eGFR 71 > OR = 60 mL/min/1.55m   BUN/Creatinine Ratio 25 (H) 6 - 22 (calc)   Sodium 138 135 - 146 mmol/L   Potassium 4.0 3.5 - 5.3 mmol/L   Chloride 99 98 - 110 mmol/L   CO2 30 20 - 32 mmol/L   Calcium 9.8 8.6 - 10.3 mg/dL   Total Protein 6.5 6.1 - 8.1 g/dL   Albumin 4.4 3.6 - 5.1 g/dL   Globulin 2.1 1.9 - 3.7 g/dL (calc)   AG Ratio 2.1 1.0 - 2.5 (calc)   Total Bilirubin 0.6 0.2 - 1.2 mg/dL   Alkaline phosphatase (APISO) 27 (L) 35 - 144 U/L   AST 37 (H) 10 - 35 U/L   ALT 58 (H) 9 - 46 U/L  CBC with Differential/Platelet  Result Value Ref Range   WBC 5.3 3.8 - 10.8 Thousand/uL   RBC 5.86 (H) 4.20 - 5.80 Million/uL   Hemoglobin 16.1 13.2 - 17.1 g/dL   HCT 51.4 (H) 38.5 - 50.0 %   MCV 87.7 80.0 - 100.0 fL   MCH 27.5 27.0 - 33.0 pg   MCHC 31.3 (L) 32.0 - 36.0 g/dL   RDW 12.6 11.0 - 15.0 %   Platelets 157 140 - 400 Thousand/uL   MPV 11.3 7.5 - 12.5 fL   Neutro Abs 2,772 1,500 - 7,800 cells/uL   Lymphs Abs 1,601 850 - 3,900 cells/uL   Absolute Monocytes 557 200 - 950 cells/uL   Eosinophils Absolute 292 15 - 500  cells/uL   Basophils Absolute 80 0 - 200 cells/uL   Neutrophils Relative % 52.3 %   Total Lymphocyte 30.2 %   Monocytes Relative 10.5 %   Eosinophils Relative 5.5 %   Basophils Relative 1.5 %  Hemoglobin A1c  Result Value Ref Range   Hgb A1c MFr Bld 8.5 (H) <5.7 % of total Hgb   Mean Plasma Glucose 197 mg/dL   eAG (mmol/L) 10.9 mmol/L      Assessment & Plan:   Problem List Items Addressed  This Visit     Type 2 diabetes mellitus with other specified complication (HCC)    Elevated A1c to 8.5, now improved from prior result 8.8 Complications - cataracts, hyperlipidemia, obesity, depression-  increases risk of future cardiovascular complications   Plan:  Dose increase to 71m Ozempic when able through manufacturer will notify [place]land Ormondto help initiate dosage change Continue insulin - dose adjustment accordingly - Continue Jardiance 22mdaily, Pioglitazone 151maily, Metformin XR 1000m47mD (500 x 2) Goal to come off Actos in future or reduce meal time insulin  2. Encourage improved lifestyle - low carb, low sugar diet, reduce portion size, continue improving regular exercise 3. Check CBG, bring log to next visit for review 4. Continue ASA, ARB, Statin 5. Advised to schedule DM ophtho exam, send record DM Foot exam       Morbid obesity with BMI of 40.0-44.9, adult (HCC)   Major depressive disorder, recurrent, moderate (HCC)    Chronic problems Worse with stress eating and anxiety now Will give handout of therapist options and he can locate one in network accepting patients we can refer if he request       Hyperlipidemia associated with type 2 diabetes mellitus (HCC)[place] On statin therapy       Gastroesophageal reflux disease without esophagitis    GERD Trial on Omeprazole 20mg34mly new rx       Relevant Medications   omeprazole (PRILOSEC) 20 MG capsule   GAD (generalized anxiety disorder)   Cataract associated with type 2 diabetes mellitus (HCC) [place]  Diagnoses     Annual physical exam    -  Primary   Pneumococcal vaccination indicated           Updated Health Maintenance information PNA vaccine prevnar20 due when we have back in stock Reviewed recent lab results with patient Encouraged improvement to lifestyle with diet and exercise Goal of weight loss   Meds ordered this encounter  Medications   omeprazole (PRILOSEC) 20 MG capsule    Sig: Take 1 capsule (20 mg total) by mouth daily before breakfast.    Dispense:  90 capsule    Refill:  3      Follow up plan: Return in about 3 months (around 10/08/2021) for 3 month follow-up DM A1c, mood PHQ therapist update.  Forward chart to ElisaHumedose adjust on Ozempic through PAP  AlexaNobie PutnamSo[place]ika [place]p 07/08/2021, 9:28 AM

## 2021-07-08 NOTE — Assessment & Plan Note (Signed)
On statin therapy 

## 2021-07-08 NOTE — Assessment & Plan Note (Signed)
GERD Trial on Omeprazole '20mg'$  daily new rx

## 2021-07-08 NOTE — Assessment & Plan Note (Signed)
Elevated A1c to 8.5, now improved from prior result 8.8 Complications - cataracts, hyperlipidemia, obesity, depression-  increases risk of future cardiovascular complications   Plan:  Dose increase to '1mg'$  Ozempic when able through manufacturer will notify Grayland Ormond to help initiate dosage change Continue insulin - dose adjustment accordingly - Continue Jardiance '25mg'$  daily, Pioglitazone '15mg'$  daily, Metformin XR '1000mg'$  BID (500 x 2) Goal to come off Actos in future or reduce meal time insulin  2. Encourage improved lifestyle - low carb, low sugar diet, reduce portion size, continue improving regular exercise 3. Check CBG, bring log to next visit for review 4. Continue ASA, ARB, Statin 5. Advised to schedule DM ophtho exam, send record DM Foot exam

## 2021-07-09 ENCOUNTER — Ambulatory Visit (INDEPENDENT_AMBULATORY_CARE_PROVIDER_SITE_OTHER): Payer: Medicare Other | Admitting: Pharmacist

## 2021-07-09 DIAGNOSIS — Z794 Long term (current) use of insulin: Secondary | ICD-10-CM

## 2021-07-09 DIAGNOSIS — I10 Essential (primary) hypertension: Secondary | ICD-10-CM

## 2021-07-09 DIAGNOSIS — E1169 Type 2 diabetes mellitus with other specified complication: Secondary | ICD-10-CM

## 2021-07-09 NOTE — Patient Instructions (Signed)
Visit Information  PATIENT GOALS:  Goals Addressed             This Visit's Progress    Pharmacy Goals       Our goal A1c is less than 7%. This corresponds with fasting sugars less than 130 and 2 hour after meal sugars less than 180. Please check your blood sugar and keep a log of the results  Please check your home blood pressure, keep a log of the results and bring this with you to your medical appointments.  Our goal bad cholesterol, or LDL, is less than 70 . This is why it is important to continue taking your atorvastatin.    Wallace Cullens, PharmD, [place] [phone]        The patient verbalized understanding of instructions, educational materials, and care plan provided today and declined offer to receive copy of patient instructions, educational materials, and care plan.   Telephone follow up appointment with care management team member scheduled for: 11/10/2021 at 11:00 AM

## 2021-07-09 NOTE — Chronic Care Management (AMB) (Signed)
Chronic Care Management Pharmacy Note  07/09/2021 Name:  Eric Andrews MRN:  [id number] DOB:  12/01/1951   Subjective: Eric Andrews is an 70 y.o. year old male who is a primary patient of [name], DO.  The CCM team was consulted for assistance with disease management and care coordination needs.    Engaged with patient by telephone for follow up visit in response to provider referral for pharmacy case management and/or care coordination services.   Consent to Services:  The patient was given information about Chronic Care Management services, agreed to services, and gave verbal consent prior to initiation of services.  Please see initial visit note for detailed documentation.   Patient Care Team: [name], DO as PCP - General (Family Medicine) Reighn Kaplan, Virl Diamond, RPH-CPP (Pharmacist)  Recent office visits: Office Visit with PCP on 8/2 for annual exam and T2DM follow up  Hospital visits: None in previous 6 months  Objective:  Lab Results  Component Value Date   CREATININE 1.12 07/01/2021   CREATININE 1.08 06/18/2020   CREATININE 1.1 12/19/2019    Lab Results  Component Value Date   HGBA1C 8.5 (H) 07/01/2021   Last diabetic Eye exam:  Lab Results  Component Value Date/Time   HMDIABEYEEXA No Retinopathy 07/16/2020 12:00 AM    Last diabetic Foot exam: No results found for: HMDIABFOOTEX      Component Value Date/Time   CHOL 122 07/01/2021 0809   TRIG 209 (H) 07/01/2021 0809   HDL 33 (L) 07/01/2021 0809   CHOLHDL 3.7 07/01/2021 0809   LDLCALC 62 07/01/2021 0809    Social History   Tobacco Use  Smoking Status Never  Smokeless Tobacco Never   BP Readings from Last 3 Encounters:  07/08/21 (!) 91/43  04/21/21 113/70  03/12/21 130/82   Pulse Readings from Last 3 Encounters:  07/08/21 73  04/21/21 92  03/12/21 83   Wt Readings from Last 3 Encounters:  07/08/21 (!) 315 lb (142.9 kg)  04/21/21 (!) 316 lb (143.3 kg)  03/12/21  (!) 314 lb (142.4 kg)    Assessment: Review of patient past medical history, allergies, medications, health status, including review of consultants reports, laboratory and other test data, was performed as part of comprehensive evaluation and provision of chronic care management services.   SDOH:  (Social Determinants of Health) assessments and interventions performed: none   CCM Care Plan  Allergies  Allergen Reactions   Dust Mite Extract     Medications    Reviewed by [name], DO (Physician) on 07/08/21 at 1310  Med List Status: <None>   Medication Order Taking? Sig Documenting Provider Last Dose Status Informant  aspirin 81 MG EC tablet [id number] Yes Take 81 mg by mouth daily. [provider] Taking Active   atorvastatin (LIPITOR) 20 MG tablet [id number] Yes TAKE 1 TABLET BY MOUTH AT BEDTIME Karamalegos, [name], DO Taking Active   busPIRone (BUSPAR) 10 MG tablet [id number] Yes Take 1 tablet (10 mg total) by mouth 2 (two) times daily. [name], DO Taking Active   celecoxib (CELEBREX) 200 MG capsule [id number] Yes Take 1 capsule (200 mg total) by mouth 2 (two) times daily. [name], DO Taking Active   cetirizine (ZYRTEC) 10 MG tablet [id number] Yes Take 1 tablet (10 mg total) by mouth at bedtime. [name], DO Taking Active   chlorthalidone (HYGROTON) 25 MG tablet [id number] Yes Take 1 tablet (25 mg total) by mouth daily. [name], [name],  DO Taking Active   cholecalciferol (VITAMIN D3) 25 MCG (1000 UNIT) tablet [id number] Yes Take 1,000 Units by mouth daily. [provider] Taking Active   FARXIGA 10 MG TABS tablet [id number] Yes Take 10 mg by mouth daily before breakfast. [name], DO Taking Active   fenofibrate micronized (LOFIBRA) 134 MG capsule [id number] Yes Take 1 capsule (134 mg total) by mouth daily before breakfast. [name], DO Taking Active   finasteride  (PROSCAR) 5 MG tablet [id number] Yes TAKE 1 TABLET(5 MG) BY MOUTH EVERY EVENING Karamalegos, [name], DO Taking Active   gabapentin (NEURONTIN) 400 MG capsule [id number] Yes Take 1 capsule by mouth 3 (three) times daily. [provider] Taking Active   insulin degludec (TRESIBA FLEXTOUCH) 100 UNIT/ML FlexTouch Pen [id number] Yes Inject 68 Units into the skin at bedtime. [provider] Taking Active   Insulin Pen Needle 31G X 5 MM MISC [id number] Yes Use with insulin to inject into skin 3 times daily as directed [name], DO Taking Active   Insulin Syringe-Needle U-100 25G X 1" 1 ML MISC [id number]  Use to inject Humalog insulin up to 3 times daily with meal Karamalegos, [name], DO  Active   losartan (COZAAR) 50 MG tablet [id number] Yes Take 1 tablet (50 mg total) by mouth daily. [name], DO Taking Active   metFORMIN (GLUCOPHAGE-XR) 500 MG 24 hr tablet [id number] Yes Take 2 tablets (1,000 mg total) by mouth 2 (two) times daily with a meal. [name], [name], DO Taking Active   Multiple Vitamin (MULTIVITAMIN) capsule [id number] Yes Take 1 capsule by mouth daily. [provider] Taking Active   NOVOLOG FLEXPEN 100 UNIT/ML FlexPen [id number] Yes Inject 14 Units into the skin 3 (three) times daily with meals. Sliding scale [name], DO Taking Active   Omega-3 Fatty Acids (FISH OIL) 1000 MG CAPS [id number] Yes Take by mouth in the morning and at bedtime. [provider] Taking Active   omeprazole (PRILOSEC) 20 MG capsule [id number] Yes Take 1 capsule (20 mg total) by mouth daily before breakfast. [name], DO  Active   OZEMPIC, 0.25 OR 0.5 MG/DOSE, 2 MG/1.5ML SOPN [id number] Yes Inject 0.5 mg into the skin once a week. [name], DO Taking Active   pioglitazone (ACTOS) 15 MG tablet [id number] Yes TAKE 1 TABLET(15 MG) BY MOUTH DAILY [name], DO Taking Active   tamsulosin  (FLOMAX) 0.4 MG CAPS capsule [id number] Yes TAKE ONE CAPSULE BY MOUTH EVERY NIGHT AT BEDTIME FOR URINE RETENTION [name], DO Taking Active   traZODone (DESYREL) 100 MG tablet [id number] Yes Take 1 tablet (100 mg total) by mouth at bedtime. [name], DO Taking Active   venlafaxine XR (EFFEXOR-XR) 150 MG 24 hr capsule [id number] Yes Take 150 mg by mouth daily with breakfast. [provider] Taking Active             Patient Active Problem List   Diagnosis Date Noted   Gastroesophageal reflux disease without esophagitis 07/08/2021   Environmental and seasonal allergies 02/27/2021   Hyperlipidemia associated with type 2 diabetes mellitus ([place]) 02/27/2021   Morbid obesity with BMI of 40.0-44.9, adult ([place]) 06/26/2020   Major depressive disorder, recurrent, moderate ([place]) 06/25/2020   GAD (generalized anxiety disorder) 06/25/2020   Type 2 diabetes mellitus with other specified complication ([place]) [id number]   Chronic pain of right knee 05/27/2020   Primary osteoarthritis involving multiple joints 05/27/2020   Cataract associated with  type 2 diabetes mellitus ([place]) 05/27/2020   History of torn meniscus of left knee 05/27/2020   DDD (degenerative disc disease), lumbar 05/27/2020    Immunization History  Administered Date(s) Administered   Fluad Quad(high Dose 65+) 10/10/2020   Moderna Sars-Covid-2 Vaccination 02/09/2020, 03/13/2020   PFIZER SARS-COV-2 Pediatric Vaccination 5-63yr 10/18/2020, 04/30/2021   Tdap 01/16/2018    Conditions to be addressed/monitored: HTN, HLD and DMII  Care Plan : PharmD - Disease Management T2DM, osteoarthritis  Updates made by DVella Raring RPH-CPP since 07/09/2021 12:00 AM     Problem: Disease Progression      Long-Range Goal: Disease Progression Prevented or Minimized   Start Date: 11/06/2020  Expected End Date: 01/05/2021  Recent Progress: On track  Priority: High  Note:   Current Barriers:  Unable to  independently afford treatment regimen, particularly when enters coverage gap of [employer] D plan Patient APPROVED for patient assistance from [place] [employer] 12/06/2021 Patient APPROVED for patient assistance from [place] 12/06/2021  Pharmacist Clinical Goal(s):  Over the next 60 days, patient will verbalize ability to afford treatment regimen through collaboration with PharmD and provider.   Interventions: 1:1 collaboration with KOlin Hauser DO regarding development and update of comprehensive plan of care as evidenced by provider attestation and co-signature Inter-disciplinary care team collaboration (see longitudinal plan of care) Perform chart review. Patient seen for Office Visit with PCP on 8/2 for annual exam and T2DM follow up.  Receive message from PCP advising plan to increase patient's Ozempic dose to 1 mg weekly and requests that I collaborate with patient and [place] assistance program for timing of dose change based on patient's current supply  Medication Assistance: Today Mr. RUelandreviews supply and advises that he has 5 months of Ozempic 0.5 mg remaining in his fridge. Review options with patient. Patient expresses preference continue Ozempic 0.5 mg weekly until uses up current supply prior to increasing to Ozempic 1 mg weekly Plan to follow up with patient by telephone as previously scheduled on 12/5 Follow up with NEast[employer] Chemicalpatient assistance program today as patient on automatic refill. Speak with representative SSunday Spillerswho advises that a refill of Ozempic 0.5 mg currently being processed for patient. SSunday Spillersstates that she will send a message for this refill to be stopped and advises that our office can then fax over the reorder form for the new Ozempic dose when patient/provider ready Will follow up with PCP to let him know patient preference to use up supply of Ozempic  0.5 mg weekly prior to increase of dose to Ozempic 1 mg weekly  Type 2 Diabetes  Uncontrolled; current treatment Tresiba 68 units daily in evening  Previous direction from PCP regarding fasting sugar reading in AM: if consistently < 150 reduce by about 2 units per 3 days or more Novolog - Reported taking three times daily with meals following sliding scale from previous Endocrinologist in [place] based on blood sugar prior to each meal: If less than 140: takes 13 units If 140-200: takes 14 units If 200-250: takes 15 units If 250 or above: takes 16 units Metformin ER 500 mg - 2 tablets (1000 mg) twice daily Pioglitazone 15 mg once daily Ozempic 0.5 mg weekly on Thursdays Farxiga 10 mg daily  Lipid management: atorvastatin 20 mg daily Recalls recent home blood sugars ranging: ~118-180 Denies hypoglycemia Have discussed role of diet and exercise on blood sugar  control Reports has noticed his higher blood sugar readings tied to larger portion sizes when stressed Notes PCP has given him a handout of local therapist options for him to review/contact for help with stress/anxiety Have counseled patient on importance of guidance from provider for adjustment of medications; patient has expressed preference for more self-management of his blood sugar control.   Hypertension Reports taking: Chlorthalidone 25 mg daily Losartan 50 mg daily From review of chart, note BP yesterday in office recorded as: 91/43, HR 73 Reports he discussed this reading with provider; states reading abnormal for him; denies symptoms with reading Note patient monitors home blood pressure with wrist monitor Reports readings at home have been good for him, but does not have readings to provide today Denies symptoms of hypotension Have encouraged patient to obtain upper arm blood pressure monitor for greater accuracy of upper arm vs wrist monitor Patient denied need for upper arm monitor. States  prefers wrist monitor Encouraged patient to monitor home blood pressure, keep log of results and to contact office for reading outside of established parameters or symptoms  Patient denies further medication questions/needs or need for scheduled follow up with CM Pharmacist prior to December  Patient Goals/Self-Care Activities Over the next 60 days, patient will:  - collaborate with provider on medication access solutions - patient to call office for new or worsening medical concerns - attend medical appointments as scheduled  Follow Up Plan: Telephone follow up appointment with care management team member scheduled for: 11/10/2021 at 11:00 AM     Patient's preferred pharmacy is:  [facility] [place], [place] - [address] [facility] [place] [address] Phone: [phone] Fax: [phone]   Follow Up:  Patient agrees to Care Plan and Follow-up.  Wallace Cullens, PharmD, Para [place], CPP Clinical Pharmacist [facility] [phone]

## 2021-07-10 DIAGNOSIS — G4733 Obstructive sleep apnea (adult) (pediatric): Secondary | ICD-10-CM | POA: Diagnosis not present

## 2021-07-14 DIAGNOSIS — G479 Sleep disorder, unspecified: Secondary | ICD-10-CM | POA: Diagnosis not present

## 2021-07-14 DIAGNOSIS — M542 Cervicalgia: Secondary | ICD-10-CM | POA: Diagnosis not present

## 2021-07-14 DIAGNOSIS — R200 Anesthesia of skin: Secondary | ICD-10-CM | POA: Diagnosis not present

## 2021-07-14 DIAGNOSIS — M79602 Pain in left arm: Secondary | ICD-10-CM | POA: Diagnosis not present

## 2021-07-14 DIAGNOSIS — M545 Low back pain, unspecified: Secondary | ICD-10-CM | POA: Diagnosis not present

## 2021-07-15 ENCOUNTER — Ambulatory Visit (INDEPENDENT_AMBULATORY_CARE_PROVIDER_SITE_OTHER): Payer: Medicare Other

## 2021-07-15 VITALS — Ht 70.0 in | Wt 308.0 lb

## 2021-07-15 DIAGNOSIS — Z0000 Encounter for general adult medical examination without abnormal findings: Secondary | ICD-10-CM

## 2021-07-15 NOTE — Patient Instructions (Signed)
[name] , Thank you for taking time to come for your Medicare Wellness Visit. I appreciate your ongoing commitment to your health goals. Please review the following plan we discussed and let me know if I can assist you in the future.   Screening recommendations/referrals: Colonoscopy: completed 02/09/2018, due 02/10/2023 Recommended yearly ophthalmology/optometry visit for glaucoma screening and checkup Recommended yearly dental visit for hygiene and checkup  Vaccinations: Influenza vaccine: due Pneumococcal vaccine: due Tdap vaccine: completed 01/16/2018, due 01/17/2028 Shingles vaccine: discussed   Covid-19:  04/30/2021, 10/18/2020, 03/13/2020, 02/09/2020  Advanced directives: Advance directive discussed with you today.   Conditions/risks identified: none  Next appointment: Follow up in one year for your annual wellness visit.   Preventive Care 69 Years and Older, Male Preventive care refers to lifestyle choices and visits with your health care provider that can promote health and wellness. What does preventive care include? A yearly physical exam. This is also called an annual well check. Dental exams once or twice a year. Routine eye exams. Ask your health care provider how often you should have your eyes checked. Personal lifestyle choices, including: Daily care of your teeth and gums. Regular physical activity. Eating a healthy diet. Avoiding tobacco and drug use. Limiting alcohol use. Practicing safe sex. Taking low doses of aspirin every day. Taking vitamin and mineral supplements as recommended by your health care provider. What happens during an annual well check? The services and screenings done by your health care provider during your annual well check will depend on your age, overall health, lifestyle risk factors, and family history of disease. Counseling  Your health care provider may ask you questions about your: Alcohol use. Tobacco use. Drug use. Emotional  well-being. Home and relationship well-being. Sexual activity. Eating habits. History of falls. Memory and ability to understand (cognition). Work and work Statistician. Screening  You may have the following tests or measurements: Height, weight, and BMI. Blood pressure. Lipid and cholesterol levels. These may be checked every 5 years, or more frequently if you are over 34 years old. Skin check. Lung cancer screening. You may have this screening every year starting at age 5 if you have a 30-pack-year history of smoking and currently smoke or have quit within the past 15 years. Fecal occult blood test (FOBT) of the stool. You may have this test every year starting at age 46. Flexible sigmoidoscopy or colonoscopy. You may have a sigmoidoscopy every 5 years or a colonoscopy every 10 years starting at age 31. Prostate cancer screening. Recommendations will vary depending on your family history and other risks. Hepatitis C blood test. Hepatitis B blood test. Sexually transmitted disease (STD) testing. Diabetes screening. This is done by checking your blood sugar (glucose) after you have not eaten for a while (fasting). You may have this done every 1-3 years. Abdominal aortic aneurysm (AAA) screening. You may need this if you are a current or former smoker. Osteoporosis. You may be screened starting at age 93 if you are at high risk. Talk with your health care provider about your test results, treatment options, and if necessary, the need for more tests. Vaccines  Your health care provider may recommend certain vaccines, such as: Influenza vaccine. This is recommended every year. Tetanus, diphtheria, and acellular pertussis (Tdap, Td) vaccine. You may need a Td booster every 10 years. Zoster vaccine. You may need this after age 104. Pneumococcal 13-valent conjugate (PCV13) vaccine. One dose is recommended after age 51. Pneumococcal polysaccharide (PPSV23) vaccine. One dose is  recommended after  age 9. Talk to your health care provider about which screenings and vaccines you need and how often you need them. This information is not intended to replace advice given to you by your health care provider. Make sure you discuss any questions you have with your health care provider. Document Released: 12/20/2015 Document Revised: 08/12/2016 Document Reviewed: 09/24/2015 Elsevier Interactive Patient Education  2017 [place] Prevention in the Home Falls can cause injuries. They can happen to people of all ages. There are many things you can do to make your home safe and to help prevent falls. What can I do on the outside of my home? Regularly fix the edges of walkways and driveways and fix any cracks. Remove anything that might make you trip as you walk through a door, such as a raised step or threshold. Trim any bushes or trees on the path to your home. Use bright outdoor lighting. Clear any walking paths of anything that might make someone trip, such as rocks or tools. Regularly check to see if handrails are loose or broken. Make sure that both sides of any steps have handrails. Any raised decks and porches should have guardrails on the edges. Have any leaves, snow, or ice cleared regularly. Use sand or salt on walking paths during winter. Clean up any spills in your garage right away. This includes oil or grease spills. What can I do in the bathroom? Use night lights. Install grab bars by the toilet and in the tub and shower. Do not use towel bars as grab bars. Use non-skid mats or decals in the tub or shower. If you need to sit down in the shower, use a plastic, non-slip stool. Keep the floor dry. Clean up any water that spills on the floor as soon as it happens. Remove soap buildup in the tub or shower regularly. Attach bath mats securely with double-sided non-slip rug tape. Do not have throw rugs and other things on the floor that can make you trip. What can I do in the  bedroom? Use night lights. Make sure that you have a light by your bed that is easy to reach. Do not use any sheets or blankets that are too big for your bed. They should not hang down onto the floor. Have a firm chair that has side arms. You can use this for support while you get dressed. Do not have throw rugs and other things on the floor that can make you trip. What can I do in the kitchen? Clean up any spills right away. Avoid walking on wet floors. Keep items that you use a lot in easy-to-reach places. If you need to reach something above you, use a strong step stool that has a grab bar. Keep electrical cords out of the way. Do not use floor polish or wax that makes floors slippery. If you must use wax, use non-skid floor wax. Do not have throw rugs and other things on the floor that can make you trip. What can I do with my stairs? Do not leave any items on the stairs. Make sure that there are handrails on both sides of the stairs and use them. Fix handrails that are broken or loose. Make sure that handrails are as long as the stairways. Check any carpeting to make sure that it is firmly attached to the stairs. Fix any carpet that is loose or worn. Avoid having throw rugs at the top or bottom of the stairs. If  you do have throw rugs, attach them to the floor with carpet tape. Make sure that you have a light switch at the top of the stairs and the bottom of the stairs. If you do not have them, ask someone to add them for you. What else can I do to help prevent falls? Wear shoes that: Do not have high heels. Have rubber bottoms. Are comfortable and fit you well. Are closed at the toe. Do not wear sandals. If you use a stepladder: Make sure that it is fully opened. Do not climb a closed stepladder. Make sure that both sides of the stepladder are locked into place. Ask someone to hold it for you, if possible. Clearly mark and make sure that you can see: Any grab bars or  handrails. First and last steps. Where the edge of each step is. Use tools that help you move around (mobility aids) if they are needed. These include: Canes. Walkers. Scooters. Crutches. Turn on the lights when you go into a dark area. Replace any light bulbs as soon as they burn out. Set up your furniture so you have a clear path. Avoid moving your furniture around. If any of your floors are uneven, fix them. If there are any pets around you, be aware of where they are. Review your medicines with your doctor. Some medicines can make you feel dizzy. This can increase your chance of falling. Ask your doctor what other things that you can do to help prevent falls. This information is not intended to replace advice given to you by your health care provider. Make sure you discuss any questions you have with your health care provider. Document Released: 09/19/2009 Document Revised: 04/30/2016 Document Reviewed: 12/28/2014 Elsevier Interactive Patient Education  2017 [employer].

## 2021-07-15 NOTE — Progress Notes (Signed)
I connected with Eric Winegarden today by telephone and verified that I am speaking with the correct person using two identifiers. Location patient: home Location provider: work Persons participating in the virtual visit: Mckinnley Yvette, Evelyn LPN.   I discussed the limitations, risks, security and privacy concerns of performing an evaluation and management service by telephone and the availability of in person appointments. I also discussed with the patient that there may be a patient responsible charge related to this service. The patient expressed understanding and verbally consented to this telephonic visit.    Interactive audio and video telecommunications were attempted between this provider and patient, however failed, due to patient having technical difficulties OR patient did not have access to video capability.  We continued and completed visit with audio only.     Vital signs may be patient reported or missing.  Subjective:   Eric Andrews is a 70 y.o. male who presents for Medicare Annual/Subsequent preventive examination.  Review of Systems     Cardiac Risk Factors include: advanced age (>54mn, >>22women);diabetes mellitus;dyslipidemia;male gender;obesity (BMI >30kg/m2);sedentary lifestyle     Objective:    Today's Vitals   07/15/21 1016 07/15/21 1017  Weight: (!) 308 lb (139.7 kg)   Height: '5\' 10"'$  (1.778 m)   PainSc:  2    Body mass index is 44.19 kg/m.  Advanced Directives 07/15/2021 09/12/2020 07/09/2020  Does Patient Have a Medical Advance Directive? No No No  Would patient like information on creating a medical advance directive? - No - Patient declined -    Current Medications (verified) Outpatient Encounter Medications as of 07/15/2021  Medication Sig   aspirin 81 MG EC tablet Take 81 mg by mouth daily.   atorvastatin (LIPITOR) 20 MG tablet TAKE 1 TABLET BY MOUTH AT BEDTIME   busPIRone (BUSPAR) 10 MG tablet Take 1 tablet (10 mg total) by mouth 2 (two) times  daily.   celecoxib (CELEBREX) 200 MG capsule Take 1 capsule (200 mg total) by mouth 2 (two) times daily.   cetirizine (ZYRTEC) 10 MG tablet Take 1 tablet (10 mg total) by mouth at bedtime.   chlorthalidone (HYGROTON) 25 MG tablet Take 1 tablet (25 mg total) by mouth daily.   cholecalciferol (VITAMIN D3) 25 MCG (1000 UNIT) tablet Take 1,000 Units by mouth daily.   FARXIGA 10 MG TABS tablet Take 10 mg by mouth daily before breakfast.   fenofibrate micronized (LOFIBRA) 134 MG capsule Take 1 capsule (134 mg total) by mouth daily before breakfast.   finasteride (PROSCAR) 5 MG tablet TAKE 1 TABLET(5 MG) BY MOUTH EVERY EVENING   gabapentin (NEURONTIN) 400 MG capsule Take 1 capsule by mouth 3 (three) times daily.   insulin degludec (TRESIBA FLEXTOUCH) 100 UNIT/ML FlexTouch Pen Inject 68 Units into the skin at bedtime.   Insulin Pen Needle 31G X 5 MM MISC Use with insulin to inject into skin 3 times daily as directed   losartan (COZAAR) 50 MG tablet Take 1 tablet (50 mg total) by mouth daily.   metFORMIN (GLUCOPHAGE-XR) 500 MG 24 hr tablet Take 2 tablets (1,000 mg total) by mouth 2 (two) times daily with a meal.   Multiple Vitamin (MULTIVITAMIN) capsule Take 1 capsule by mouth daily.   NOVOLOG FLEXPEN 100 UNIT/ML FlexPen Inject 14 Units into the skin 3 (three) times daily with meals. Sliding scale   Omega-3 Fatty Acids (FISH OIL) 1000 MG CAPS Take by mouth in the morning and at bedtime.   omeprazole (PRILOSEC) 20 MG capsule Take  1 capsule (20 mg total) by mouth daily before breakfast.   OZEMPIC, 0.25 OR 0.5 MG/DOSE, 2 MG/1.5ML SOPN Inject 0.5 mg into the skin once a week.   pioglitazone (ACTOS) 15 MG tablet TAKE 1 TABLET(15 MG) BY MOUTH DAILY   tamsulosin (FLOMAX) 0.4 MG CAPS capsule TAKE ONE CAPSULE BY MOUTH EVERY NIGHT AT BEDTIME FOR URINE RETENTION   traZODone (DESYREL) 100 MG tablet Take 1 tablet (100 mg total) by mouth at bedtime.   venlafaxine XR (EFFEXOR-XR) 150 MG 24 hr capsule Take 150 mg by  mouth daily with breakfast.   Insulin Syringe-Needle U-100 25G X 1" 1 ML MISC Use to inject Humalog insulin up to 3 times daily with meal   No facility-administered encounter medications on file as of 07/15/2021.    Allergies (verified) Dust mite extract   History: Past Medical History:  Diagnosis Date   ADD (attention deficit disorder)    Anxiety    Depression    Diabetes mellitus without complication (HCC)    Enlarged prostate    Knee pain    Sleep apnea    Past Surgical History:  Procedure Laterality Date   APPENDECTOMY     HERNIA REPAIR     Left   KNEE ARTHROSCOPY     Left   Family History  Problem Relation Age of Onset   Heart attack Mother 51   Lung cancer Father    Alzheimer's disease Brother    Heart attack Maternal Grandfather 99   Social History   Socioeconomic History   Marital status: Married    Spouse name: Not on file   Number of children: Not on file   Years of education: Not on file   Highest education level: Not on file  Occupational History   Occupation: retired  Tobacco Use   Smoking status: Never   Smokeless tobacco: Never  Vaping Use   Vaping Use: Never used  Substance and Sexual Activity   Alcohol use: Not Currently   Drug use: Never   Sexual activity: Not Currently  Other Topics Concern   Not on file  Social History Narrative   Not on file   Social Determinants of Health   Financial Resource Strain: Low Risk    Difficulty of Paying Living Expenses: Not hard at all  Food Insecurity: No Food Insecurity   Worried About Charity fundraiser in the Last Year: Never true   Ran Out of Food in the Last Year: Never true  Transportation Needs: No Transportation Needs   Lack of Transportation (Medical): No   Lack of Transportation (Non-Medical): No  Physical Activity: Inactive   Days of Exercise per Week: 0 days   Minutes of Exercise per Session: 0 min  Stress: No Stress Concern Present   Feeling of Stress : Not at all  Social  Connections: Not on file    Tobacco Counseling Counseling given: Not Answered   Clinical Intake:  Pre-visit preparation completed: Yes  Pain : 0-10 Pain Score: 2  Pain Type: Chronic pain Pain Location: Back Pain Orientation: Lower Pain Descriptors / Indicators: Aching Pain Onset: More than a month ago Pain Frequency: Constant     Nutritional Status: BMI > 30  Obese Nutritional Risks: None Diabetes: Yes  How often do you need to have someone help you when you read instructions, pamphlets, or other written materials from your doctor or pharmacy?: 1 - Never What is the last grade level you completed in school?: college  Diabetic? Yes Nutrition  Risk Assessment:  Has the patient had any N/V/D within the last 2 months?  No  Does the patient have any non-healing wounds?  No  Has the patient had any unintentional weight loss or weight gain?  No   Diabetes:  Is the patient diabetic?  Yes  If diabetic, was a CBG obtained today?  No  Did the patient bring in their glucometer from home?  No  How often do you monitor your CBG's? daily.   Financial Strains and Diabetes Management:  Are you having any financial strains with the device, your supplies or your medication? No .  Does the patient want to be seen by Chronic Care Management for management of their diabetes?  No  Would the patient like to be referred to a Nutritionist or for Diabetic Management?  No   Diabetic Exams:  Diabetic Eye Exam: Completed 07/16/2020 Diabetic Foot Exam: Overdue, Pt has been advised about the importance in completing this exam. Pt is scheduled for diabetic foot exam on next apopointment.   Interpreter Needed?: No  Information entered by :: NAllen LPN   Activities of Daily Living In your present state of health, do you have any difficulty performing the following activities: 07/15/2021 04/21/2021  Hearing? Y Y  Comment decreased hearing from excessive noise in past -  Vision? N Y   Difficulty concentrating or making decisions? Y Y  Comment trouble remember -  Walking or climbing stairs? Y Y  Dressing or bathing? N N  Doing errands, shopping? N N  Preparing Food and eating ? N -  Using the Toilet? N -  In the past six months, have you accidently leaked urine? N -  Do you have problems with loss of bowel control? N -  Managing your Medications? N -  Managing your Finances? N -  Housekeeping or managing your Housekeeping? N -  Some recent data might be hidden    Patient Care Team: [name], DO as PCP - General (Family Medicine) [name], Virl Diamond, RPH-CPP (Pharmacist)  Indicate any recent Medical Services you may have received from other than [facility] providers in the past year (date may be approximate).     Assessment:   This is a routine wellness examination for Edger.  Hearing/Vision screen Vision Screening - Comments:: Regular eye exams, [facility]  Dietary issues and exercise activities discussed: Current Exercise Habits: The patient does not participate in regular exercise at present   Goals Addressed             This Visit's Progress    Patient Stated       07/15/2021, wants legs to work better       Depression Screen PHQ 2/9 Scores 07/15/2021 07/08/2021 04/21/2021 02/27/2021 10/10/2020 09/02/2020 07/09/2020  PHQ - 2 Score 0 '2 2 2 2 6 1  '$ PHQ- 9 Score - '14 5 14 15 23 13    '$ Fall Risk Fall Risk  07/15/2021 07/08/2021 04/21/2021 07/09/2020 06/25/2020  Falls in the past year? '1 1 1 1 '$ 0  Comment riding bike - - lost balance and trips -  Number falls in past yr: 0 '1 1 1 '$ 0  Injury with Fall? 0 0 0 1 0  Risk for fall due to : Impaired balance/gait;Medication side effect - - History of fall(s);Impaired balance/gait;Impaired mobility;Medication side effect -  Follow up Falls evaluation completed;Education provided;Falls prevention discussed Falls evaluation completed Falls evaluation completed Falls evaluation completed;Education provided;Falls  prevention discussed Falls evaluation completed  FALL RISK PREVENTION PERTAINING TO THE HOME:  Any stairs in or around the home? Yes  If so, are there any without handrails? No  Home free of loose throw rugs in walkways, pet beds, electrical cords, etc? Yes  Adequate lighting in your home to reduce risk of falls? Yes   ASSISTIVE DEVICES UTILIZED TO PREVENT FALLS:  Life alert? No  Use of a cane, walker or w/c? No  Grab bars in the bathroom? No  Shower chair or bench in shower? Yes  Elevated toilet seat or a handicapped toilet? No   TIMED UP AND GO:  Was the test performed? No .      Cognitive Function:     6CIT Screen 07/15/2021 07/09/2020  What Year? 0 points 0 points  What month? 0 points 0 points  What time? 0 points 0 points  Count back from 20 0 points 0 points  Months in reverse 0 points 0 points  Repeat phrase 0 points 0 points  Total Score 0 0    Immunizations Immunization History  Administered Date(s) Administered   Fluad Quad(high Dose 65+) 10/10/2020   Moderna Sars-Covid-2 Vaccination 02/09/2020, 03/13/2020   PFIZER SARS-COV-2 Pediatric Vaccination 5-37yr 10/18/2020, 04/30/2021   Tdap 01/16/2018    TDAP status: Up to date  Flu Vaccine status: Due, Education has been provided regarding the importance of this vaccine. Advised may receive this vaccine at local pharmacy or Health Dept. Aware to provide a copy of the vaccination record if obtained from local pharmacy or Health Dept. Verbalized acceptance and understanding.  Pneumococcal vaccine status: Due, Education has been provided regarding the importance of this vaccine. Advised may receive this vaccine at local pharmacy or Health Dept. Aware to provide a copy of the vaccination record if obtained from local pharmacy or Health Dept. Verbalized acceptance and understanding.  Covid-19 vaccine status: Completed vaccines  Qualifies for Shingles Vaccine? Yes   Zostavax completed No   Shingrix Completed?:  No.    Education has been provided regarding the importance of this vaccine. Patient has been advised to call insurance company to determine out of pocket expense if they have not yet received this vaccine. Advised may also receive vaccine at local pharmacy or Health Dept. Verbalized acceptance and understanding.  Screening Tests Health Maintenance  Topic Date Due   Hepatitis C Screening  Never done   Zoster Vaccines- Shingrix (1 of 2) Never done   PNA vac Low Risk Adult (1 of 2 - PCV13) Never done   COVID-19 Vaccine (3 - Moderna risk series) 05/28/2021   INFLUENZA VACCINE  07/07/2021   OPHTHALMOLOGY EXAM  07/16/2021   HEMOGLOBIN A1C  01/01/2022   FOOT EXAM  07/08/2022   COLONOSCOPY (Pts 45-460yrInsurance coverage will need to be confirmed)  02/10/2023   TETANUS/TDAP  01/17/2028   HPV VACCINES  Aged Out    Health Maintenance  Health Maintenance Due  Topic Date Due   Hepatitis C Screening  Never done   Zoster Vaccines- Shingrix (1 of 2) Never done   PNA vac Low Risk Adult (1 of 2 - PCV13) Never done   COVID-19 Vaccine (3 - Moderna risk series) 05/28/2021   INFLUENZA VACCINE  07/07/2021    Colorectal cancer screening: Type of screening: Colonoscopy. Completed 02/09/2018. Repeat every 5 years  Lung Cancer Screening: (Low Dose CT Chest recommended if Age 69-80ears, 30 pack-year currently smoking OR have quit w/in 15years.) does not qualify.   Lung Cancer Screening Referral: no  Additional Screening:  Hepatitis C Screening: does qualify;   Vision Screening: Recommended annual ophthalmology exams for early detection of glaucoma and other disorders of the eye. Is the patient up to date with their annual eye exam?  Yes  Who is the provider or what is the name of the office in which the patient attends annual eye exams? [facility] If pt is not established with a provider, would they like to be referred to a provider to establish care? No .   Dental Screening: Recommended  annual dental exams for proper oral hygiene  Community Resource Referral / Chronic Care Management: CRR required this visit?  No   CCM required this visit?  No      Plan:     I have personally reviewed and noted the following in the patient's chart:   Medical and social history Use of alcohol, tobacco or illicit drugs  Current medications and supplements including opioid prescriptions. Patient is not currently taking opioid prescriptions. Functional ability and status Nutritional status Physical activity Advanced directives List of other physicians Hospitalizations, surgeries, and ER visits in previous 12 month/Vitals Screenings to include cognitive, depression, and falls Referrals and appointments  In addition, I have reviewed and discussed with patient certain preventive protocols, quality metrics, and best practice recommendations. A written personalized care plan for preventive services as well as general preventive health recommendations were provided to patient.     Kellie Simmering, LPN   [id number]   Nurse Notes:

## 2021-07-16 DIAGNOSIS — E119 Type 2 diabetes mellitus without complications: Secondary | ICD-10-CM | POA: Diagnosis not present

## 2021-07-16 DIAGNOSIS — H2513 Age-related nuclear cataract, bilateral: Secondary | ICD-10-CM | POA: Diagnosis not present

## 2021-07-16 DIAGNOSIS — H35372 Puckering of macula, left eye: Secondary | ICD-10-CM | POA: Diagnosis not present

## 2021-07-17 ENCOUNTER — Ambulatory Visit: Payer: Self-pay | Admitting: General Practice

## 2021-07-17 DIAGNOSIS — F331 Major depressive disorder, recurrent, moderate: Secondary | ICD-10-CM | POA: Diagnosis not present

## 2021-07-17 DIAGNOSIS — Z794 Long term (current) use of insulin: Secondary | ICD-10-CM | POA: Diagnosis not present

## 2021-07-17 DIAGNOSIS — E785 Hyperlipidemia, unspecified: Secondary | ICD-10-CM | POA: Diagnosis not present

## 2021-07-17 DIAGNOSIS — I10 Essential (primary) hypertension: Secondary | ICD-10-CM

## 2021-07-17 DIAGNOSIS — E1169 Type 2 diabetes mellitus with other specified complication: Secondary | ICD-10-CM

## 2021-07-17 DIAGNOSIS — G8929 Other chronic pain: Secondary | ICD-10-CM

## 2021-07-17 DIAGNOSIS — F411 Generalized anxiety disorder: Secondary | ICD-10-CM

## 2021-07-17 NOTE — Patient Instructions (Signed)
Visit Information  PATIENT GOALS:  Goals Addressed             This Visit's Progress    RNCM: Cope with Chronic Pain       Timeframe:  Long-Range Goal Priority:  Medium Start Date:       07-17-2021                      Expected End Date:    07-17-2022                   Follow Up Date 07/28/2021    - learn how to meditate - learn relaxation techniques - practice acceptance of chronic pain - practice relaxation or meditation daily - spend time with positive people - tell myself I can (not I can't) - think of new ways to do favorite things    Why is this important?   Stress makes chronic pain feel worse.  Feelings like depression, anxiety, stress and anger can make your body more sensitive to pain.  Learning ways to cope with stress or depression may help you find some relief from the pain.     Notes:      RNCM: Monitor and Manage My Blood Sugar-Diabetes Type 2       Timeframe:  Long-Range Goal Priority:  Medium Start Date:       07-17-2021                      Expected End Date:       07-17-2022                Follow Up Date 07/28/2021    - check blood sugar at prescribed times - check blood sugar before and after exercise - check blood sugar if I feel it is too high or too low - enter blood sugar readings and medication or insulin into daily log - take the blood sugar log to all doctor visits    Why is this important?   Checking your blood sugar at home helps to keep it from getting very high or very low.  Writing the results in a diary or log helps the doctor know how to care for you.  Your blood sugar log should have the time, date and the results.  Also, write down the amount of insulin or other medicine that you take.  Other information, like what you ate, exercise done and how you were feeling, will also be helpful.     Notes: 07-17-2021: Last hemoglobin A1C was 8.5 on 07-08-2021     RNCM: Track and Manage My Symptoms-Depression       Timeframe:  Long-Range  Goal Priority:  Medium Start Date:    07-17-2021                         Expected End Date:   07-17-2022                    Follow Up Date 07/28/2021    - avoid negative self-talk - develop a personal safety plan - develop a plan to deal with triggers like holidays, anniversaries - exercise at least 2 to 3 times per week - have a plan for how to handle bad days - journal feelings and what helps to feel better or worse - spend time or talk with others at least 2 to 3 times per week -  spend time or talk with others every day - watch for early signs of feeling worse    Why is this important?   Keeping track of your progress will help your treatment team find the right mix of medicine and therapy for you.  Write in your journal every day.  Day-to-day changes in depression symptoms are normal. It may be more helpful to check your progress at the end of each week instead of every day.     Notes:         Patient verbalizes understanding of instructions provided today and agrees to view in [place].   Telephone follow up appointment with care management team member scheduled for: 07-28-2021 at 0900 am  Noreene Larsson RN, MSN, [place] Mobile: [phone]

## 2021-07-17 NOTE — Chronic Care Management (AMB) (Signed)
Chronic Care Management   CCM RN Visit Note  07/17/2021 Name: Eric Andrews MRN: [id number] DOB: Jan 24, 1951  Subjective: Eric Andrews is a 70 y.o. year old male who is a primary care patient of [name], DO. The care management team was consulted for assistance with disease management and care coordination needs.    Patient was in the office for medication pick up  for  initial visit set up with the patient for services with the [facility]  in response to provider referral for case management and/or care coordination services.   Consent to Services:  The patient was given information about Chronic Care Management services, agreed to services, and gave verbal consent prior to initiation of services.  Please see initial visit note for detailed documentation.   Patient agreed to services and verbal consent obtained.   Assessment: Review of patient past medical history, allergies, medications, health status, including review of consultants reports, laboratory and other test data, was performed as part of comprehensive evaluation and provision of chronic care management services.   SDOH (Social Determinants of Health) assessments and interventions performed:    CCM Care Plan  Allergies  Allergen Reactions   Dust Mite Extract     Outpatient Encounter Medications as of 07/17/2021  Medication Sig   aspirin 81 MG EC tablet Take 81 mg by mouth daily.   atorvastatin (LIPITOR) 20 MG tablet TAKE 1 TABLET BY MOUTH AT BEDTIME   busPIRone (BUSPAR) 10 MG tablet Take 1 tablet (10 mg total) by mouth 2 (two) times daily.   celecoxib (CELEBREX) 200 MG capsule Take 1 capsule (200 mg total) by mouth 2 (two) times daily.   cetirizine (ZYRTEC) 10 MG tablet Take 1 tablet (10 mg total) by mouth at bedtime.   chlorthalidone (HYGROTON) 25 MG tablet Take 1 tablet (25 mg total) by mouth daily.   cholecalciferol (VITAMIN D3) 25 MCG (1000 UNIT) tablet Take 1,000 Units by mouth daily.   FARXIGA 10 MG TABS  tablet Take 10 mg by mouth daily before breakfast.   fenofibrate micronized (LOFIBRA) 134 MG capsule Take 1 capsule (134 mg total) by mouth daily before breakfast.   finasteride (PROSCAR) 5 MG tablet TAKE 1 TABLET(5 MG) BY MOUTH EVERY EVENING   gabapentin (NEURONTIN) 400 MG capsule Take 1 capsule by mouth 3 (three) times daily.   insulin degludec (TRESIBA FLEXTOUCH) 100 UNIT/ML FlexTouch Pen Inject 68 Units into the skin at bedtime.   Insulin Pen Needle 31G X 5 MM MISC Use with insulin to inject into skin 3 times daily as directed   Insulin Syringe-Needle U-100 25G X 1" 1 ML MISC Use to inject Humalog insulin up to 3 times daily with meal   losartan (COZAAR) 50 MG tablet Take 1 tablet (50 mg total) by mouth daily.   metFORMIN (GLUCOPHAGE-XR) 500 MG 24 hr tablet Take 2 tablets (1,000 mg total) by mouth 2 (two) times daily with a meal.   Multiple Vitamin (MULTIVITAMIN) capsule Take 1 capsule by mouth daily.   NOVOLOG FLEXPEN 100 UNIT/ML FlexPen Inject 14 Units into the skin 3 (three) times daily with meals. Sliding scale   Omega-3 Fatty Acids (FISH OIL) 1000 MG CAPS Take by mouth in the morning and at bedtime.   omeprazole (PRILOSEC) 20 MG capsule Take 1 capsule (20 mg total) by mouth daily before breakfast.   OZEMPIC, 0.25 OR 0.5 MG/DOSE, 2 MG/1.5ML SOPN Inject 0.5 mg into the skin once a week.   pioglitazone (ACTOS) 15 MG tablet TAKE 1  TABLET(15 MG) BY MOUTH DAILY   tamsulosin (FLOMAX) 0.4 MG CAPS capsule TAKE ONE CAPSULE BY MOUTH EVERY NIGHT AT BEDTIME FOR URINE RETENTION   traZODone (DESYREL) 100 MG tablet Take 1 tablet (100 mg total) by mouth at bedtime.   venlafaxine XR (EFFEXOR-XR) 150 MG 24 hr capsule Take 150 mg by mouth daily with breakfast.   No facility-administered encounter medications on file as of 07/17/2021.    Patient Active Problem List   Diagnosis Date Noted   Gastroesophageal reflux disease without esophagitis 07/08/2021   Environmental and seasonal allergies 02/27/2021    Hyperlipidemia associated with type 2 diabetes mellitus ([place]) 02/27/2021   Morbid obesity with BMI of 40.0-44.9, adult ([place]) 06/26/2020   Major depressive disorder, recurrent, moderate ([place]) 06/25/2020   GAD (generalized anxiety disorder) 06/25/2020   Type 2 diabetes mellitus with other specified complication ([place]) 99991111   Chronic pain of right knee 05/27/2020   Primary osteoarthritis involving multiple joints 05/27/2020   Cataract associated with type 2 diabetes mellitus ([place]) 05/27/2020   History of torn meniscus of left knee 05/27/2020   DDD (degenerative disc disease), lumbar 05/27/2020    Conditions to be addressed/monitored:HLD, DMII, Anxiety, Depression, and Chronic pain   Care Plan : RNCM: General plan of care for DM, HLD, Depression, Anxiety, and Chronic Pain  Updates made by Vanita Ingles since 07/17/2021 12:00 AM     Problem: RNCM: General plan of care for DM, HLD, Depression, Anxiety, and Chronic Pain   Priority: High  Onset Date: 07/17/2021     Long-Range Goal: RNCM: General plan of care for DM, HLD, Depression, Anxiety, and Chronic Pain   Start Date: 07/17/2021  Expected End Date: 07/17/2022  This Visit's Progress: On track  Priority: High  Note:   Current Barriers:  Knowledge Deficits related to plan of care for management of HLD, DMII,  Anxiety with Excessive Worry, Social Anxiety, and Depression: depressed mood,, and Chronic pain in back and bilateral knees   Care Coordination needs related to Financial constraints related to medication cost and Medication procurement  Chronic Disease Management support and education needs related to HLD, DMII, Anxiety with Excessive Worry, Social Anxiety, and Depression: depressed mood,, and chronic pain  RNCM Clinical Goal(s):  Patient will verbalize understanding of plan for management of HLD, DMII, Anxiety, Depression, and Chronic pain verbalize basic understanding of HLD, DMII, Anxiety, Depression, and Chronic pain   disease process and self health management plan by working with the CCM team to effectively manage health and well being.  take all medications exactly as prescribed and will call provider for medication related questions demonstrate understanding of rationale for each prescribed medication and take as directed  attend all scheduled medical appointments: pcp follow up as scheduled  demonstrate improved adherence to prescribed treatment plan for HLD, DMII, Anxiety, Depression, and Chronic pain  as evidenced by daily monitoring and recording of CBG  adherence to ADA/ carb modified diet exercise 5/6 days/week adherence to prescribed medication regimen contacting provider for new or worsened symptoms or questions related to DM work with pharmacist to address Financial constraints related to medications cost and Medication procurement related to HLD, DMII, Anxiety, Depression, and Chronic pain through collaboration with RN Care manager, provider, and care team.   Interventions: 1:1 collaboration with primary care provider regarding development and update of comprehensive plan of care as evidenced by provider attestation and co-signature Inter-disciplinary care team collaboration (see longitudinal plan of care) Evaluation of current treatment plan related to  self  management and patient's adherence to plan as established by provider   SDOH Barriers (Status: New goal. Goal on track: YES.)  Patient interviewed and SDOH assessment performed        Patient interviewed and appropriate assessments performed    Diabetes:  (Status: New goal.) Lab Results  Component Value Date   HGBA1C 8.5 (H) 07/01/2021  Assessed patient's understanding of A1c goal: <7% Provided education to patient about basic DM disease process; Reviewed medications with patient and discussed importance of medication adherence;        Reviewed prescribed diet with patient heart healthy/ADA diet; Counseled on importance of regular  laboratory monitoring as prescribed;        Discussed plans with patient for ongoing care management follow up and provided patient with direct contact information for care management team;      Provided patient with written educational materials related to hypo and hyperglycemia and importance of correct treatment;       call provider for findings outside established parameters;       Referral made to pharmacy team for assistance with medications cost. The patient is getting medications through the medications assistance program through the end of 2022- working with the pharm D;       Review of patient status, including review of consultants reports, relevant laboratory and other test results, and medications completed;       Reviewed medications with the office manager, Dione Booze today as the patient was there to pick up medications sent from the medications assistance program.   Anxiety and Depression   (Status: New goal.) Evaluation of current treatment plan related to Anxiety and Depression,  self-management and patient's adherence to plan as established by provider. Discussed plans with patient for ongoing care management follow up and provided patient with direct contact information for care management team Advised patient to call the office for changes in mood, anxiety, or depression; Discussed plans with patient for ongoing care management follow up and provided patient with direct contact information for care management team; Screening for signs and symptoms of depression related to chronic disease state;  Assessed social determinant of health barriers;   Hyperlipidemia:  (Status: New goal.) Lab Results  Component Value Date   CHOL 122 07/01/2021   HDL 33 (L) 07/01/2021   LDLCALC 62 07/01/2021   TRIG 209 (H) 07/01/2021   CHOLHDL 3.7 07/01/2021     Medication review performed; medication list updated in electronic medical record.  Provider established cholesterol goals  reviewed; Counseled on importance of regular laboratory monitoring as prescribed; Provided HLD educational materials; Reviewed role and benefits of statin for ASCVD risk reduction; Discussed strategies to manage statin-induced myalgias; Reviewed importance of limiting foods high in cholesterol;  Pain:  (Status: New goal.) Pain assessment performed Medications reviewed Reviewed provider established plan for pain management; Discussed importance of adherence to all scheduled medical appointments; Counseled on the importance of reporting any/all new or changed pain symptoms or management strategies to pain management provider; Advised patient to report to care team affect of pain on daily activities; Discussed use of relaxation techniques and/or diversional activities to assist with pain reduction (distraction, imagery, relaxation, massage, acupressure, TENS, heat, and cold application; Reviewed with patient prescribed pharmacological and nonpharmacological pain relief strategies; Advised patient to discuss increased pain or intensity of pain with provider;  Patient Goals/Self-Care Activities: Patient will self administer medications as prescribed Patient will attend all scheduled provider appointments Patient will call pharmacy for medication refills Patient will attend  church or other social activities Patient will continue to perform ADL's independently Patient will continue to perform IADL's independently Patient will call provider office for new concerns or questions Patient will work with BSW to address care coordination needs and will continue to work with the clinical team to address health care and disease management related needs.         Plan:Telephone follow up appointment with care management team member scheduled for:  07-28-2021 at 0900 am  Noreene Larsson RN, MSN, [place] Mobile:  [phone]

## 2021-07-28 ENCOUNTER — Telehealth: Payer: Medicare Other

## 2021-07-29 ENCOUNTER — Ambulatory Visit: Payer: Self-pay | Admitting: *Deleted

## 2021-07-29 ENCOUNTER — Ambulatory Visit
Admission: EM | Admit: 2021-07-29 | Discharge: 2021-07-29 | Disposition: A | Discharge status: Home / Self Care | Payer: Medicare Other

## 2021-07-29 ENCOUNTER — Emergency Department: Payer: Medicare Other

## 2021-07-29 ENCOUNTER — Other Ambulatory Visit: Payer: Self-pay

## 2021-07-29 DIAGNOSIS — Z79899 Other long term (current) drug therapy: Secondary | ICD-10-CM | POA: Insufficient documentation

## 2021-07-29 DIAGNOSIS — R079 Chest pain, unspecified: Secondary | ICD-10-CM | POA: Insufficient documentation

## 2021-07-29 DIAGNOSIS — I10 Essential (primary) hypertension: Secondary | ICD-10-CM | POA: Diagnosis not present

## 2021-07-29 DIAGNOSIS — Z7982 Long term (current) use of aspirin: Secondary | ICD-10-CM | POA: Insufficient documentation

## 2021-07-29 DIAGNOSIS — E1169 Type 2 diabetes mellitus with other specified complication: Secondary | ICD-10-CM | POA: Diagnosis not present

## 2021-07-29 DIAGNOSIS — Z794 Long term (current) use of insulin: Secondary | ICD-10-CM | POA: Insufficient documentation

## 2021-07-29 DIAGNOSIS — Z7984 Long term (current) use of oral hypoglycemic drugs: Secondary | ICD-10-CM | POA: Insufficient documentation

## 2021-07-29 DIAGNOSIS — E785 Hyperlipidemia, unspecified: Secondary | ICD-10-CM | POA: Insufficient documentation

## 2021-07-29 DIAGNOSIS — R197 Diarrhea, unspecified: Secondary | ICD-10-CM

## 2021-07-29 DIAGNOSIS — K5100 Ulcerative (chronic) pancolitis without complications: Secondary | ICD-10-CM | POA: Diagnosis not present

## 2021-07-29 DIAGNOSIS — R1084 Generalized abdominal pain: Secondary | ICD-10-CM

## 2021-07-29 DIAGNOSIS — Z20822 Contact with and (suspected) exposure to covid-19: Secondary | ICD-10-CM | POA: Diagnosis not present

## 2021-07-29 DIAGNOSIS — A040 Enteropathogenic Escherichia coli infection: Secondary | ICD-10-CM | POA: Diagnosis not present

## 2021-07-29 DIAGNOSIS — R000 Tachycardia, unspecified: Secondary | ICD-10-CM | POA: Diagnosis not present

## 2021-07-29 DIAGNOSIS — R509 Fever, unspecified: Secondary | ICD-10-CM

## 2021-07-29 DIAGNOSIS — J029 Acute pharyngitis, unspecified: Secondary | ICD-10-CM | POA: Insufficient documentation

## 2021-07-29 DIAGNOSIS — A045 Campylobacter enteritis: Secondary | ICD-10-CM | POA: Insufficient documentation

## 2021-07-29 DIAGNOSIS — R109 Unspecified abdominal pain: Secondary | ICD-10-CM | POA: Diagnosis not present

## 2021-07-29 HISTORY — DX: Essential (primary) hypertension: I10

## 2021-07-29 HISTORY — DX: Unspecified osteoarthritis, unspecified site: M19.90

## 2021-07-29 LAB — COMPREHENSIVE METABOLIC PANEL
ALT: 42 U/L (ref 0–44)
AST: 28 U/L (ref 15–41)
Albumin: 3.6 g/dL (ref 3.5–5.0)
Alkaline Phosphatase: 33 U/L — ABNORMAL LOW (ref 38–126)
Anion gap: 13 (ref 5–15)
BUN: 21 mg/dL (ref 8–23)
CO2: 28 mmol/L (ref 22–32)
Calcium: 9.5 mg/dL (ref 8.9–10.3)
Chloride: 92 mmol/L — ABNORMAL LOW (ref 98–111)
Creatinine, Ser: 1.2 mg/dL (ref 0.61–1.24)
GFR, Estimated: >60 mL/min (ref >60)
Glucose, Bld: 211 mg/dL — ABNORMAL HIGH (ref 70–99)
Potassium: 3.5 mmol/L (ref 3.5–5.1)
Sodium: 133 mmol/L — ABNORMAL LOW (ref 135–145)
Total Bilirubin: 0.8 mg/dL (ref 0.3–1.2)
Total Protein: 7.1 g/dL (ref 6.5–8.1)

## 2021-07-29 LAB — URINALYSIS, COMPLETE (UACMP) WITH MICROSCOPIC
Bacteria, UA: NONE SEEN
Bilirubin Urine: NEGATIVE
Glucose, UA: >=500 mg/dL — AB
Hgb urine dipstick: NEGATIVE
Ketones, ur: 5 mg/dL — AB
Leukocytes,Ua: NEGATIVE
Nitrite: NEGATIVE
Protein, ur: 30 mg/dL — AB
Specific Gravity, Urine: 1.031 — ABNORMAL HIGH (ref 1.005–1.030)
pH: 6 (ref 5.0–8.0)

## 2021-07-29 LAB — CBC
HCT: 46.8 % (ref 39.0–52.0)
Hemoglobin: 16.2 g/dL (ref 13.0–17.0)
MCH: 28.8 pg (ref 26.0–34.0)
MCHC: 34.6 g/dL (ref 30.0–36.0)
MCV: 83.3 fL (ref 80.0–100.0)
Platelets: 141 10*3/uL — ABNORMAL LOW (ref 150–400)
RBC: 5.62 MIL/uL (ref 4.22–5.81)
RDW: 13.6 % (ref 11.5–15.5)
WBC: 8.6 10*3/uL (ref 4.0–10.5)
nRBC: 0 % (ref 0.0–0.2)

## 2021-07-29 LAB — TROPONIN I (HIGH SENSITIVITY)
Troponin I (High Sensitivity): 15 ng/L (ref <18)
Troponin I (High Sensitivity): 15 ng/L (ref <18)

## 2021-07-29 LAB — LIPASE, BLOOD: Lipase: 34 U/L (ref 11–51)

## 2021-07-29 NOTE — ED Triage Notes (Signed)
Pt states he began to have sore throat fever and diarrhea a few days ago. Pt having generalized abd pain. Pt denies cough shortness of breath, states he has intermittent chst pain.

## 2021-07-29 NOTE — Discharge Instructions (Addendum)
Please go to the emergency department at [facility] for evaluation of your abdominal pain, fever, and diarrhea.

## 2021-07-29 NOTE — Telephone Encounter (Signed)
Patient is calling to report he has been sick for 5 days. Patient has had 2 negative COVID home test- last one being yesterday. Patient states he started with sore throat, fever, body aches and diarrhea 5 days ago. He states the sore throat lasted 2 days, the fever lasted 4 days and the body aches and diarrhea are still present. Patient states he also has abdominal pain that he rates moderate and constant. Patient states OTC is not helping the diarrhea. Patient advised per protocol- UC due to late hour and severity of symptoms. He states he will go.

## 2021-07-29 NOTE — Telephone Encounter (Signed)
Summary: body aches, diarrhea and fever   Pt states he has taken two home covid test / both were negative but he has been experiencing body aches and diarrhea for five days / pt also mentioned having a fever of 100 for the first three days /please advise / pt has a virtual appt on Friday at 4pm      Reason for Disposition  [1] Constant abdominal pain AND [2] present > 2 hours  Answer Assessment - Initial Assessment Questions 1. DIARRHEA SEVERITY: "How bad is the diarrhea?" "How many more stools have you had in the past 24 hours than normal?"    - NO DIARRHEA (SCALE 0)   - MILD (SCALE 1-3): Few loose or mushy BMs; increase of 1-3 stools over normal daily number of stools; mild increase in ostomy output.   -  MODERATE (SCALE 4-7): Increase of 4-6 stools daily over normal; moderate increase in ostomy output. * SEVERE (SCALE 8-10; OR 'WORST POSSIBLE'): Increase of 7 or more stools daily over normal; moderate increase in ostomy output; incontinence.     moderate 2. ONSET: "When did the diarrhea begin?"      5 days ago 3. BM CONSISTENCY: "How loose or watery is the diarrhea?"      watery 4. VOMITING: "Are you also vomiting?" If Yes, ask: "How many times in the past 24 hours?"      no 5. ABDOMINAL PAIN: "Are you having any abdominal pain?" If Yes, ask: "What does it feel like?" (e.g., crampy, dull, intermittent, constant)      Yes- constant 6. ABDOMINAL PAIN SEVERITY: If present, ask: "How bad is the pain?"  (e.g., Scale 1-10; mild, moderate, or severe)   - MILD (1-3): doesn't interfere with normal activities, abdomen soft and not tender to touch    - MODERATE (4-7): interferes with normal activities or awakens from sleep, abdomen tender to touch    - SEVERE (8-10): excruciating pain, doubled over, unable to do any normal activities       moderate 7. ORAL INTAKE: If vomiting, "Have you been able to drink liquids?" "How much liquids have you had in the past 24 hours?"     No vomiting 8.  HYDRATION: "Any signs of dehydration?" (e.g., dry mouth [not just dry lips], too weak to stand, dizziness, new weight loss) "When did you last urinate?"     Weakness, 30 minutes ago-urinating 9. EXPOSURE: "Have you traveled to a foreign country recently?" "Have you been exposed to anyone with diarrhea?" "Could you have eaten any food that was spoiled?"     Patient thinks he may have eaten something bad- fried pork 10. ANTIBIOTIC USE: "Are you taking antibiotics now or have you taken antibiotics in the past 2 months?"       no 11. OTHER SYMPTOMS: "Do you have any other symptoms?" (e.g., fever, blood in stool)       Body aches 12. PREGNANCY: "Is there any chance you are pregnant?" "When was your last menstrual period?"       N/a  Protocols used: [facility]

## 2021-07-29 NOTE — ED Triage Notes (Signed)
Pt reports abd pain x 5 days with diarrhea, denies n/v, reports food "taste bad" Abd pain radiates to pt's spine, reports hx of lumbar pain. Decrease appetite.   Fever 2-3 days several days ago, took Tylenol cold med  Pt reports hx DM, BS high x2 days, 235 & 238.   2 home COVID test - (neg)

## 2021-07-29 NOTE — ED Provider Notes (Signed)
[facility]    CSN: [id number] Arrival date & time: 07/29/21  1734      History   Chief Complaint Chief Complaint  Patient presents with   Abdominal Pain    HPI Eric Andrews is a 70 y.o. male.   HPI  70 year old male here for evaluation of abdominal complaints.  Patient reports that for the last 5 days he has been experiencing upper abdominal pain that wraps around to his spine and is sharp in nature.  For the last 4 days it has been a 10/10 pain and today is slightly improved to an 8/10 pain.  Patient reports that he has had a decreased appetite, and altered sense of taste.  Pain increases after eating.  Patient reports that he is having 6-7 diarrhea stools a day that are light brown to yellow in color.  He denies any blood in the stools.  He is also had a fever for the last 5 days.  He denies seeing any blood in the stool, pain with urination, urinary urgency, or urinary frequency.  Patient reports that he called his primary care doctor and was advised to come to urgent care for evaluation.  Past Medical History:  Diagnosis Date   ADD (attention deficit disorder)    Anxiety    Arthritis    Depression    Diabetes mellitus without complication ([place])    Enlarged prostate    Hypertension    Knee pain    Sleep apnea     Patient Active Problem List   Diagnosis Date Noted   Gastroesophageal reflux disease without esophagitis 07/08/2021   Environmental and seasonal allergies 02/27/2021   Hyperlipidemia associated with type 2 diabetes mellitus ([place]) 02/27/2021   Morbid obesity with BMI of 40.0-44.9, adult ([place]) 06/26/2020   Major depressive disorder, recurrent, moderate ([place]) 06/25/2020   GAD (generalized anxiety disorder) 06/25/2020   Type 2 diabetes mellitus with other specified complication ([place]) 99991111   Chronic pain of right knee 05/27/2020   Primary osteoarthritis involving multiple joints 05/27/2020   Cataract associated with type 2 diabetes mellitus  ([place]) 05/27/2020   History of torn meniscus of left knee 05/27/2020   DDD (degenerative disc disease), lumbar 05/27/2020    Past Surgical History:  Procedure Laterality Date   APPENDECTOMY     CARPEL TUNEL Bilateral    HERNIA REPAIR     Left   KNEE ARTHROSCOPY     Left   RIGHT WRIST SX X2         Home Medications    Prior to Admission medications   Medication Sig Start Date End Date Taking? Authorizing Provider  gabapentin (NEURONTIN) 400 MG capsule Take by mouth. 07/14/21 08/13/21 Yes [provider]  metFORMIN (GLUCOPHAGE-XR) 500 MG 24 hr tablet Take by mouth. 05/27/20  Yes [provider]  aspirin 81 MG EC tablet Take 81 mg by mouth daily.    [provider]  atorvastatin (LIPITOR) 20 MG tablet TAKE 1 TABLET BY MOUTH AT BEDTIME 08/01/20   Karamalegos, Alexander J, DO  busPIRone (BUSPAR) 10 MG tablet Take 1 tablet (10 mg total) by mouth 2 (two) times daily. 02/27/21   Karamalegos, [name], DO  celecoxib (CELEBREX) 200 MG capsule Take 1 capsule (200 mg total) by mouth 2 (two) times daily. 02/27/21   Karamalegos, [name], DO  cetirizine (ZYRTEC) 10 MG tablet Take 1 tablet (10 mg total) by mouth at bedtime. 02/27/21   Karamalegos, [name], DO  chlorthalidone (HYGROTON) 25 MG  tablet Take 1 tablet (25 mg total) by mouth daily. 12/09/20   Karamalegos, [name], DO  cholecalciferol (VITAMIN D3) 25 MCG (1000 UNIT) tablet Take 1,000 Units by mouth daily.    [provider]  dapagliflozin propanediol (FARXIGA) 5 MG TABS tablet Take by mouth.    [provider]  FARXIGA 10 MG TABS tablet Take 10 mg by mouth daily before breakfast. 01/21/21   [name], [name], DO  fenofibrate micronized (LOFIBRA) 134 MG capsule Take 1 capsule (134 mg total) by mouth daily before breakfast. 02/27/21   [name], [name], DO  finasteride (PROSCAR) 5 MG tablet TAKE 1 TABLET(5 MG) BY MOUTH EVERY EVENING 07/03/21   Karamalegos, [name], DO   gabapentin (NEURONTIN) 400 MG capsule Take 1 capsule by mouth 3 (three) times daily. 12/27/20   [provider]  insulin degludec (TRESIBA FLEXTOUCH) 100 UNIT/ML FlexTouch Pen Inject 68 Units into the skin at bedtime.    [provider]  Insulin Pen Needle 31G X 5 MM MISC Use with insulin to inject into skin 3 times daily as directed 10/10/20   [name], [name], DO  Insulin Syringe-Needle U-100 25G X 1" 1 ML MISC Use to inject Humalog insulin up to 3 times daily with meal 03/04/21   Karamalegos, Alexander J, DO  losartan (COZAAR) 50 MG tablet Take 1 tablet (50 mg total) by mouth daily. 02/27/21   Karamalegos, [name], DO  metFORMIN (GLUCOPHAGE-XR) 500 MG 24 hr tablet Take 2 tablets (1,000 mg total) by mouth 2 (two) times daily with a meal. 12/09/20   Karamalegos, [name], DO  Multiple Vitamin (MULTIVITAMIN) capsule Take 1 capsule by mouth daily.    [provider]  NOVOLOG FLEXPEN 100 UNIT/ML FlexPen Inject 14 Units into the skin 3 (three) times daily with meals. Sliding scale 03/11/21   Karamalegos, [name], DO  Omega-3 Fatty Acids (FISH OIL) 1000 MG CAPS Take by mouth in the morning and at bedtime.    [provider]  omeprazole (PRILOSEC) 20 MG capsule Take 1 capsule (20 mg total) by mouth daily before breakfast. 07/08/21   Karamalegos, [name], DO  OZEMPIC, 0.25 OR 0.5 MG/DOSE, 2 MG/1.5ML SOPN Inject 0.5 mg into the skin once a week. 12/09/20   Karamalegos, [name], DO  pioglitazone (ACTOS) 15 MG tablet TAKE 1 TABLET(15 MG) BY MOUTH DAILY 05/23/21   [name], [name], DO  tamsulosin (FLOMAX) 0.4 MG CAPS capsule TAKE ONE CAPSULE BY MOUTH EVERY NIGHT AT BEDTIME FOR URINE RETENTION 12/24/20   [name], [name], DO  traZODone (DESYREL) 100 MG tablet Take 1 tablet (100 mg total) by mouth at bedtime. 02/27/21   Karamalegos, [name], DO  venlafaxine XR (EFFEXOR-XR) 150 MG 24 hr capsule Take 150 mg by mouth daily with breakfast.     [provider]    Family History Family History  Problem Relation Age of Onset   Heart attack Mother 64   Lung cancer Father    Alzheimer's disease Brother    Heart attack Maternal Grandfather 80    Social History Social History   Tobacco Use   Smoking status: Never   Smokeless tobacco: Never  Vaping Use   Vaping Use: Never used  Substance Use Topics   Alcohol use: Not Currently   Drug use: Never     Allergies   Dust mite extract   Review of Systems Review of Systems  Constitutional:  Positive for appetite change and fever. Negative for activity change.  Gastrointestinal:  Positive for abdominal pain and diarrhea. Negative for blood in stool, constipation, nausea and vomiting.  Genitourinary:  Negative for dysuria, frequency, hematuria and urgency.  Musculoskeletal:  Positive for back pain.  Skin:  Negative for rash.  Hematological: Negative.   Psychiatric/Behavioral: Negative.      Physical Exam Triage Vital Signs ED Triage Vitals  Enc Vitals Group     BP 07/29/21 1747 124/74     Pulse Rate 07/29/21 1747 94     Resp --      Temp 07/29/21 1747 (!) 101.5 F (38.6 C)     Temp Source 07/29/21 1747 Oral     SpO2 07/29/21 1747 96 %     Weight 07/29/21 1753 (!) 310 lb (140.6 kg)     Height 07/29/21 1753 '5\' 10"'$  (1.778 m)     Head Circumference --      Peak Flow --      Pain Score 07/29/21 1751 5     Pain Loc --      Pain Edu? --      Excl. in [place]? --    No data found.  Updated Vital Signs BP 124/74 (BP Location: Left Arm)   Pulse 94   Temp (!) 101.5 F (38.6 C) (Oral)   Ht '5\' 10"'$  (1.778 m)   Wt (!) 310 lb (140.6 kg)   SpO2 96%   BMI 44.48 kg/m   Visual Acuity Right Eye Distance:   Left Eye Distance:   Bilateral Distance:    Right Eye Near:   Left Eye Near:    Bilateral Near:     Physical Exam Vitals and nursing note reviewed.  Constitutional:      Appearance: He is well-developed. He is obese. He is ill-appearing.  HENT:      Head: Normocephalic and atraumatic.  Cardiovascular:     Rate and Rhythm: Normal rate and regular rhythm.     Heart sounds: Normal heart sounds. No murmur heard.   No gallop.  Pulmonary:     Effort: Pulmonary effort is normal.     Breath sounds: Normal breath sounds. No wheezing, rhonchi or rales.  Abdominal:     General: Abdomen is protuberant. Bowel sounds are normal.     Palpations: Abdomen is soft. There is no hepatomegaly or splenomegaly.     Tenderness: There is abdominal tenderness in the right lower quadrant, epigastric area, left upper quadrant and left lower quadrant. There is no right CVA tenderness, left CVA tenderness, guarding or rebound.  Skin:    General: Skin is warm and dry.     Capillary Refill: Capillary refill takes less than 2 seconds.     Findings: No erythema or rash.  Neurological:     General: No focal deficit present.     Mental Status: He is alert and oriented to person, place, and time.  Psychiatric:        Mood and Affect: Mood normal.        Behavior: Behavior normal.     UC Treatments / Results  Labs (all labs ordered are listed, but only abnormal results are displayed) Labs Reviewed - No data to display  EKG   Radiology No results found.  Procedures Procedures (including critical care time)  Medications Ordered in UC Medications - No data to display  Initial Impression / Assessment and Plan / UC Course  I have reviewed the triage vital signs and the nursing notes.  Pertinent labs & imaging results that were  available during my care of the patient were reviewed by me and considered in my medical decision making (see chart for details).  Patient is a nontoxic-appearing obese 70 year old male here for evaluation of abdominal pain that is associated with fever, altered taste, decreased appetite, and diarrhea that has been present for last 5 days.  Patient denies seeing any blood in the stool.  Eating increases the pain.  He has not been  taking his fever at home but he has 101.5 here in clinic.  His pain is currently an 8/10 and has been a 10/10 for the past 4 days.  Patient also ports his blood sugars have been elevated during 300.  He is having 6-7 stools a day that are yellow to light brown in color.  He has had no nausea and vomiting.  No urinary complaints.  Patient's physical exam reveals a benign cardiopulmonary exam with clear lung sounds in all fields.  Patient has no CVA tenderness on exam.  Abdomen is protuberant but soft.  Patient has epigastric and left upper quadrant tenderness, left lower quadrant tenderness, and marked right lower quadrant tenderness.  Patient states that he does not have his appendix anymore.  Advised patient that he needs advanced imaging of his abdomen and he also needs blood work, both of which we cannot do in the urgent care at this time and therefore it is advisable for him to go to the emergency department to be evaluated.  In the absence of an appendix the differential diagnosis include diverticulitis, colitis, or intra-abdominal infectious process.  Patient has a left in stable condition to go to [facility] ER via POV.   Final Clinical Impressions(s) / UC Diagnoses   Final diagnoses:  Generalized abdominal pain  Diarrhea, unspecified type  Fever, unspecified     Discharge Instructions      Please go to the emergency department at [facility] for evaluation of your abdominal pain, fever, and diarrhea.     ED Prescriptions   None    PDMP not reviewed this encounter.   Margarette Canada, NP 07/29/21 [phone]

## 2021-07-30 ENCOUNTER — Emergency Department
Admission: EM | Admit: 2021-07-30 | Discharge: 2021-07-30 | Disposition: A | Discharge status: Home / Self Care | Payer: Medicare Other | Attending: Emergency Medicine | Admitting: Emergency Medicine

## 2021-07-30 ENCOUNTER — Emergency Department: Payer: Medicare Other

## 2021-07-30 DIAGNOSIS — K5100 Ulcerative (chronic) pancolitis without complications: Secondary | ICD-10-CM

## 2021-07-30 DIAGNOSIS — R000 Tachycardia, unspecified: Secondary | ICD-10-CM | POA: Diagnosis not present

## 2021-07-30 DIAGNOSIS — A040 Enteropathogenic Escherichia coli infection: Secondary | ICD-10-CM

## 2021-07-30 DIAGNOSIS — R109 Unspecified abdominal pain: Secondary | ICD-10-CM | POA: Diagnosis not present

## 2021-07-30 DIAGNOSIS — A045 Campylobacter enteritis: Secondary | ICD-10-CM

## 2021-07-30 LAB — RESP PANEL BY RT-PCR (FLU A&B, COVID) ARPGX2
Influenza A by PCR: NEGATIVE
Influenza B by PCR: NEGATIVE
SARS Coronavirus 2 by RT PCR: NEGATIVE

## 2021-07-30 LAB — GASTROINTESTINAL PANEL BY PCR, STOOL (REPLACES STOOL CULTURE)

## 2021-07-30 LAB — C DIFFICILE QUICK SCREEN W PCR REFLEX
C Diff antigen: NEGATIVE
C Diff interpretation: NOT DETECTED
C Diff toxin: NEGATIVE

## 2021-07-30 MED ORDER — ACETAMINOPHEN 500 MG PO TABS
1000.0000 mg | ORAL_TABLET | Freq: Once | ORAL | Status: AC
  Administered 2021-07-30: 1000 mg via ORAL
  Filled 2021-07-30: qty 2

## 2021-07-30 MED ORDER — PROBIOTIC 1-250 BILLION-MG PO CAPS: 1.0000 | ORAL_CAPSULE | Freq: Every day | ORAL | 1 refills | Status: DC

## 2021-07-30 MED ORDER — IOHEXOL 350 MG/ML SOLN
100.0000 mL | Freq: Once | INTRAVENOUS | Status: AC | PRN
  Administered 2021-07-30: 100 mL via INTRAVENOUS

## 2021-07-30 MED ORDER — SULFAMETHOXAZOLE-TRIMETHOPRIM 800-160 MG PO TABS
1.0000 | ORAL_TABLET | Freq: Once | ORAL | Status: AC
  Administered 2021-07-30: 1 via ORAL
  Filled 2021-07-30: qty 1

## 2021-07-30 MED ORDER — SULFAMETHOXAZOLE-TRIMETHOPRIM 800-160 MG PO TABS: 1.0000 | ORAL_TABLET | Freq: Two times a day (BID) | ORAL | 0 refills | Status: DC

## 2021-07-30 MED ORDER — AMOXICILLIN-POT CLAVULANATE 875-125 MG PO TABS
1.0000 | ORAL_TABLET | Freq: Once | ORAL | Status: DC
  Filled 2021-07-30: qty 1

## 2021-07-30 MED ORDER — AZITHROMYCIN 500 MG PO TABS: 500.0000 mg | ORAL_TABLET | Freq: Every day | ORAL | 0 refills | Status: AC

## 2021-07-30 MED ORDER — AZITHROMYCIN 500 MG PO TABS
500.0000 mg | ORAL_TABLET | Freq: Once | ORAL | Status: AC
  Administered 2021-07-30: 500 mg via ORAL
  Filled 2021-07-30: qty 1

## 2021-07-30 MED ORDER — AMOXICILLIN-POT CLAVULANATE 875-125 MG PO TABS: 1.0000 | ORAL_TABLET | Freq: Two times a day (BID) | ORAL | 0 refills | Status: DC

## 2021-07-30 NOTE — Discharge Instructions (Signed)
Please take azithromycin 500 mg once a day for 7 days.  Please take Bactrim double strength tablets twice daily for the next 7 days.  Please do not take Augmentin that was accidentally sent to your pharmacy.  We recommend close follow-up with your primary care doctor as well as gastroenterology.  You may take over-the-counter Tylenol 1000 mg every 6 hours as needed for fever and pain.  Please return to the emergency department if you have worsening abdominal pain, persistent fevers, blood in your stool, vomiting that does not stop.

## 2021-07-30 NOTE — ED Provider Notes (Addendum)
[facility] Emergency Department Provider Note  ____________________________________________   Event Date/Time   First MD Initiated Contact with Patient 07/30/21 0115     (approximate)  I have reviewed the triage vital signs and the nursing notes.   HISTORY  Chief Complaint Abdominal Pain and Chest Pain    HPI Eric Andrews is a 70 y.o. male with history of hypertension, diabetes, obesity who presents to the emergency department with complaints of diffuse abdominal pain, diarrhea, fever for the past 5 to 7 days.  States he initially had some sore throat but this has resolved.  No ear pain, cough, chest pain, shortness of breath.  Denies nausea or vomiting.  No dysuria or hematuria.  No sick contacts, recent travel, hospitalization or antibiotic use.  Denies any known tick bites.  Was seen at urgent care and had a temperature of 101.5.  Instructed to come to the ED for further evaluation.  Has had previous appendectomy.  Denies other abdominal surgeries.  States he has had 2 negative COVID test at home this week.  Denies previous history of C. difficile.        Past Medical History:  Diagnosis Date   ADD (attention deficit disorder)    Anxiety    Arthritis    Depression    Diabetes mellitus without complication ([place])    Enlarged prostate    Hypertension    Knee pain    Sleep apnea     Patient Active Problem List   Diagnosis Date Noted   Gastroesophageal reflux disease without esophagitis 07/08/2021   Environmental and seasonal allergies 02/27/2021   Hyperlipidemia associated with type 2 diabetes mellitus ([place]) 02/27/2021   Morbid obesity with BMI of 40.0-44.9, adult ([place]) 06/26/2020   Major depressive disorder, recurrent, moderate ([place]) 06/25/2020   GAD (generalized anxiety disorder) 06/25/2020   Type 2 diabetes mellitus with other specified complication ([place]) [id number]   Chronic pain of right knee 05/27/2020   Primary osteoarthritis involving  multiple joints 05/27/2020   Cataract associated with type 2 diabetes mellitus ([place]) 05/27/2020   History of torn meniscus of left knee 05/27/2020   DDD (degenerative disc disease), lumbar 05/27/2020    Past Surgical History:  Procedure Laterality Date   APPENDECTOMY     CARPEL TUNEL Bilateral    HERNIA REPAIR     Left   KNEE ARTHROSCOPY     Left   RIGHT WRIST SX X2      Prior to Admission medications   Medication Sig Start Date End Date Taking? Authorizing Provider  azithromycin (ZITHROMAX) 500 MG tablet Take 1 tablet (500 mg total) by mouth daily for 7 days. Take 1 tablet daily for 3 days. 07/30/21 08/06/21 Yes Yanelis Osika, Delice Bison, DO  Bacillus Coagulans-Inulin (PROBIOTIC) 1-250 BILLION-MG CAPS Take 1 capsule by mouth daily. 07/30/21  Yes Samy Ryner, Delice Bison, DO  sulfamethoxazole-trimethoprim (BACTRIM DS) 800-160 MG tablet Take 1 tablet by mouth 2 (two) times daily. 07/30/21  Yes Myrtle Haller, Delice Bison, DO  aspirin 81 MG EC tablet Take 81 mg by mouth daily.    [provider]  atorvastatin (LIPITOR) 20 MG tablet TAKE 1 TABLET BY MOUTH AT BEDTIME 08/01/20   Karamalegos, Alexander J, DO  busPIRone (BUSPAR) 10 MG tablet Take 1 tablet (10 mg total) by mouth 2 (two) times daily. 02/27/21   Karamalegos, [name], DO  celecoxib (CELEBREX) 200 MG capsule Take 1 capsule (200 mg total) by mouth 2 (two) times daily. 02/27/21   [name], [name],  DO  cetirizine (ZYRTEC) 10 MG tablet Take 1 tablet (10 mg total) by mouth at bedtime. 02/27/21   Karamalegos, [name], DO  chlorthalidone (HYGROTON) 25 MG tablet Take 1 tablet (25 mg total) by mouth daily. 12/09/20   Karamalegos, [name], DO  cholecalciferol (VITAMIN D3) 25 MCG (1000 UNIT) tablet Take 1,000 Units by mouth daily.    [provider]  dapagliflozin propanediol (FARXIGA) 5 MG TABS tablet Take by mouth.    [provider]  FARXIGA 10 MG TABS tablet Take 10 mg by mouth daily before breakfast. 01/21/21   [name],  [name], DO  fenofibrate micronized (LOFIBRA) 134 MG capsule Take 1 capsule (134 mg total) by mouth daily before breakfast. 02/27/21   [name], [name], DO  finasteride (PROSCAR) 5 MG tablet TAKE 1 TABLET(5 MG) BY MOUTH EVERY EVENING 07/03/21   Karamalegos, [name], DO  gabapentin (NEURONTIN) 400 MG capsule Take 1 capsule by mouth 3 (three) times daily. 12/27/20   [provider]  gabapentin (NEURONTIN) 400 MG capsule Take by mouth. 07/14/21 08/13/21  [provider]  insulin degludec (TRESIBA FLEXTOUCH) 100 UNIT/ML FlexTouch Pen Inject 68 Units into the skin at bedtime.    [provider]  Insulin Pen Needle 31G X 5 MM MISC Use with insulin to inject into skin 3 times daily as directed 10/10/20   [name], [name], DO  Insulin Syringe-Needle U-100 25G X 1" 1 ML MISC Use to inject Humalog insulin up to 3 times daily with meal 03/04/21   Karamalegos, Alexander J, DO  losartan (COZAAR) 50 MG tablet Take 1 tablet (50 mg total) by mouth daily. 02/27/21   Karamalegos, [name], DO  metFORMIN (GLUCOPHAGE-XR) 500 MG 24 hr tablet Take 2 tablets (1,000 mg total) by mouth 2 (two) times daily with a meal. 12/09/20   Karamalegos, [name], DO  metFORMIN (GLUCOPHAGE-XR) 500 MG 24 hr tablet Take by mouth. 05/27/20   [provider]  Multiple Vitamin (MULTIVITAMIN) capsule Take 1 capsule by mouth daily.    [provider]  NOVOLOG FLEXPEN 100 UNIT/ML FlexPen Inject 14 Units into the skin 3 (three) times daily with meals. Sliding scale 03/11/21   Karamalegos, [name], DO  Omega-3 Fatty Acids (FISH OIL) 1000 MG CAPS Take by mouth in the morning and at bedtime.    [provider]  omeprazole (PRILOSEC) 20 MG capsule Take 1 capsule (20 mg total) by mouth daily before breakfast. 07/08/21   Karamalegos, [name], DO  OZEMPIC, 0.25 OR 0.5 MG/DOSE, 2 MG/1.5ML SOPN Inject 0.5 mg into the skin once a week. 12/09/20   Karamalegos, [name], DO   pioglitazone (ACTOS) 15 MG tablet TAKE 1 TABLET(15 MG) BY MOUTH DAILY 05/23/21   [name], [name], DO  tamsulosin (FLOMAX) 0.4 MG CAPS capsule TAKE ONE CAPSULE BY MOUTH EVERY NIGHT AT BEDTIME FOR URINE RETENTION 12/24/20   [name], [name], DO  traZODone (DESYREL) 100 MG tablet Take 1 tablet (100 mg total) by mouth at bedtime. 02/27/21   Karamalegos, [name], DO  venlafaxine XR (EFFEXOR-XR) 150 MG 24 hr capsule Take 150 mg by mouth daily with breakfast.    [provider]    Allergies Dust mite extract  Family History  Problem Relation Age of Onset   Heart attack Mother 13   Lung cancer Father    Alzheimer's disease Brother    Heart attack Maternal Grandfather 80    Social History Social History   Tobacco Use  Smoking status: Never   Smokeless tobacco: Never  Vaping Use   Vaping Use: Never used  Substance Use Topics   Alcohol use: Not Currently   Drug use: Never    Review of Systems Constitutional: + fever. Eyes: No visual changes. ENT: No sore throat. Cardiovascular: Denies chest pain. Respiratory: Denies shortness of breath. Gastrointestinal: No nausea, vomiting.  + diarrhea. Genitourinary: Negative for dysuria. Musculoskeletal: Negative for back pain. Skin: Negative for rash. Neurological: Negative for focal weakness or numbness.  ____________________________________________   PHYSICAL EXAM:  VITAL SIGNS: ED Triage Vitals  Enc Vitals Group     BP 07/29/21 1923 134/62     Pulse Rate 07/29/21 1923 97     Resp 07/29/21 1930 20     Temp 07/29/21 1923 100.1 F (37.8 C)     Temp Source 07/29/21 1923 Oral     SpO2 07/29/21 1923 95 %     Weight 07/29/21 1928 (!) 310 lb (140.6 kg)     Height 07/29/21 1928 '5\' 10"'$  (1.778 m)     Head Circumference --      Peak Flow --      Pain Score 07/29/21 1928 8     Pain Loc --      Pain Edu? --      Excl. in [place]? --    CONSTITUTIONAL: Alert and oriented and responds appropriately to questions.  Well-appearing; well-nourished, obese, no distress, nontoxic-appearing HEAD: Normocephalic EYES: Conjunctivae clear, pupils appear equal, EOM appear intact ENT: normal nose; moist mucous membranes NECK: Supple, normal ROM CARD: RRR; S1 and S2 appreciated; no murmurs, no clicks, no rubs, no gallops RESP: Normal chest excursion without splinting or tachypnea; breath sounds clear and equal bilaterally; no wheezes, no rhonchi, no rales, no hypoxia or respiratory distress, speaking full sentences ABD/GI: Normal bowel sounds; non-distended; soft, diffusely tender throughout the abdomen without guarding or rebound BACK: The back appears normal EXT: Normal ROM in all joints; no deformity noted, no edema; no cyanosis SKIN: Normal color for age and race; warm; no rash on exposed skin NEURO: Moves all extremities equally PSYCH: The patient's mood and manner are appropriate.  ____________________________________________   LABS (all labs ordered are listed, but only abnormal results are displayed)  Labs Reviewed  GASTROINTESTINAL PANEL BY PCR, STOOL (REPLACES STOOL CULTURE) - Abnormal; Notable for the following components:      Result Value   Campylobacter species DETECTED (*)    Enteropathogenic E coli (EPEC) DETECTED (*)    All other components within normal limits  COMPREHENSIVE METABOLIC PANEL - Abnormal; Notable for the following components:   Sodium 133 (*)    Chloride 92 (*)    Glucose, Bld 211 (*)    Alkaline Phosphatase 33 (*)    All other components within normal limits  CBC - Abnormal; Notable for the following components:   Platelets 141 (*)    All other components within normal limits  URINALYSIS, COMPLETE (UACMP) WITH MICROSCOPIC - Abnormal; Notable for the following components:   Color, Urine YELLOW (*)    APPearance CLEAR (*)    Specific Gravity, Urine 1.031 (*)    Glucose, UA >=500 (*)    Ketones, ur 5 (*)    Protein, ur 30 (*)    All other components within normal limits   RESP PANEL BY RT-PCR (FLU A&B, COVID) ARPGX2  C DIFFICILE QUICK SCREEN W PCR REFLEX    LIPASE, BLOOD  TROPONIN I (HIGH SENSITIVITY)  TROPONIN I (HIGH SENSITIVITY)  ____________________________________________  EKG   EKG Interpretation  Date/Time:  Tuesday July 29 2021 19:32:39 EDT Ventricular Rate:  103 PR Interval:  152 QRS Duration: 94 QT Interval:  336 QTC Calculation: 440 R Axis:   -67 Text Interpretation: Sinus tachycardia with Premature atrial complexes Left axis deviation Low voltage QRS Cannot rule out Anterior infarct , age undetermined Abnormal ECG Confirmed by Pryor Curia [phone]) on 07/30/2021 1:38:15 AM        ____________________________________________  RADIOLOGY Jessie Foot Kenric Ginger, personally viewed and evaluated these images (plain radiographs) as part of my medical decision making, as well as reviewing the written report by the radiologist.  ED MD interpretation: Chest x-ray clear.  CT scan shows pancolitis.  Official radiology report(s): DG Chest 2 View  Result Date: 07/29/2021 CLINICAL DATA:  Chest pain EXAM: CHEST - 2 VIEW COMPARISON:  None. FINDINGS: Lungs are well expanded, symmetric, and clear. No pneumothorax or pleural effusion. Cardiac size within normal limits. Pulmonary vascularity is normal. Osseous structures are age-appropriate. No acute bone abnormality. IMPRESSION: No active cardiopulmonary disease. Electronically Signed   By: Fidela Salisbury M.D.   On: 07/29/2021 20:07   CT ABDOMEN PELVIS W CONTRAST  Result Date: 07/30/2021 CLINICAL DATA:  Abdominal pain and fever. EXAM: CT ABDOMEN AND PELVIS WITH CONTRAST TECHNIQUE: Multidetector CT imaging of the abdomen and pelvis was performed using the standard protocol following bolus administration of intravenous contrast. CONTRAST:  1103m OMNIPAQUE IOHEXOL 350 MG/ML SOLN COMPARISON:  None. FINDINGS: Lower chest: Small right lower lobe pneumatocele. The visualized lung bases are otherwise clear. No  intra-abdominal free air or free fluid. Hepatobiliary: Diffuse fatty liver. No intrahepatic biliary ductal dilatation. The gallbladder is unremarkable Pancreas: Unremarkable. No pancreatic ductal dilatation or surrounding inflammatory changes. Spleen: Normal in size without focal abnormality. Adrenals/Urinary Tract: The adrenal glands unremarkable there is a 3 mm nonobstructing right renal inferior pole calculus. No hydronephrosis. There is no hydronephrosis or nephrolithiasis on the left. There is symmetric enhancement of the kidneys. The visualized ureters and urinary bladder appear unremarkable. Stomach/Bowel: Several scattered colonic diverticula without active inflammatory changes. There is inflammatory changes and thickening of the colon primarily involving the ascending and transverse colon and to a lesser degree in the distal colon consistent with pancolitis. Correlation with clinical exam and stool cultures recommended. Loose stool noted within the colon compatible with diarrheal state. There is no bowel obstruction. Appendectomy. Vascular/Lymphatic: Mild aortoiliac atherosclerotic disease. IVC is unremarkable no pain venous gas. There is no adenopathy. Reproductive: Prostate and seminal vesicles are grossly unremarkable. No pelvic masses Other: None Musculoskeletal: None osteopenia with degenerative changes of the spine. No acute osseous pathology. IMPRESSION: 1. Pancolitis with diarrheal state. Correlation with clinical exam and stool cultures recommended. No bowel obstruction. 2. Fatty liver. 3. A 3 mm nonobstructing right renal inferior pole calculus. No hydronephrosis. 4. Aortic Atherosclerosis (ICD10-I70.0). Electronically Signed   By: AAnner CreteM.D.   On: 07/30/2021 02:59    ____________________________________________   PROCEDURES  Procedure(s) performed (including Critical Care):  Procedures   ____________________________________________   INITIAL IMPRESSION / ASSESSMENT AND  PLAN / ED COURSE  As part of my medical decision making, I reviewed the following data within the e[place]notes reviewed and incorporated, Labs reviewed , EKG interpreted , Old EKG reviewed, Radiograph reviewed , CT abdomen pelvis reviewed., and Notes from prior ED visits         Patient here with complaints of fever, sore throat, abdominal pain, diarrhea.  Differential includes  COVID-19, influenza, cholecystitis, pancreatitis, colitis, diverticulitis, UTI, pyelonephritis.  He has had a previous appendectomy.  Labs reassuring today.  Will obtain CT of the abdomen pelvis.  COVID and flu swab pending.  ED PROGRESS  Patient's COVID and flu tests are negative.  CT scan shows pancolitis.  Will obtain stool samples prior to giving antibiotics.  He is nontoxic in appearance with a nonsurgical abdomen.  Continues to be hemodynamically stable.  Discussed admission versus discharge home.  He would prefer discharge.  5:46 AM  Pt's C. difficile is negative.  Continues to be hemodynamically stable with nonsurgical abdomen.  We have discussed again admission versus discharge.  He would still prefer to go home.  Will discharge on Augmentin for the next week and give outpatient GI follow-up.  Discussed return precautions at length.  He is not having any bloody stools or melena.  He is tolerating p.o.  Recommended Tylenol for fever, pain.  He declines any stronger pain medication for home.  Will discharge.   At this time, I do not feel there is any life-threatening condition present. I have reviewed, interpreted and discussed all results (EKG, imaging, lab, urine as appropriate) and exam findings with patient/family. I have reviewed nursing notes and appropriate previous records.  I feel the patient is safe to be discharged home without further emergent workup and can continue workup as an outpatient as needed. Discussed usual and customary return precautions. Patient/family verbalize  understanding and are comfortable with this plan.  Outpatient follow-up has been provided as needed. All questions have been answered.  6:20 AM  Pt's stool culture came back prior to discharge.  Patient has grown EPEC and Campylobacter.  Given his age, comorbidities and pancolitis seen on CT imaging, discussed with pharmacist who recommends azithromycin 500 mg once daily x7 days and Bactrim double strength twice daily x7 days.  Have advised patient not to pick up the Augmentin that was initially sent to his pharmacy.  Given first doses of these medications prior to discharge.  Also discharged with a probiotic.  ____________________________________________   FINAL CLINICAL IMPRESSION(S) / ED DIAGNOSES  Final diagnoses:  Pancolitis ([place])  Campylobacter diarrhea  Intestinal infection due to enteropathogenic E. coli     ED Discharge Orders          Ordered    amoxicillin-clavulanate (AUGMENTIN) 875-125 MG tablet  2 times daily,   Status:  Discontinued        07/30/21 0547    Bacillus Coagulans-Inulin (PROBIOTIC) 1-250 BILLION-MG CAPS  Daily        07/30/21 0547    azithromycin (ZITHROMAX) 500 MG tablet  Daily        07/30/21 0620    sulfamethoxazole-trimethoprim (BACTRIM DS) 800-160 MG tablet  2 times daily        07/30/21 0620            *Please note:  Sutter Achey was evaluated in Emergency Department on 07/30/2021 for the symptoms described in the history of present illness. He was evaluated in the context of the global COVID-19 pandemic, which necessitated consideration that the patient might be at risk for infection with the SARS-CoV-2 virus that causes COVID-19. Institutional protocols and algorithms that pertain to the evaluation of patients at risk for COVID-19 are in a state of rapid change based on information released by regulatory bodies including the CDC and federal and state organizations. These policies and algorithms were followed during the patient's care in the ED.  Some  ED evaluations  and interventions may be delayed as a result of limited staffing during and the pandemic.*   Note:  This document was prepared using Dragon voice recognition software and may include unintentional dictation errors.    Rhythm Gubbels, Delice Bison, DO 07/30/21 [place], Delice Bison, DO 07/30/21 [phone]

## 2021-07-30 NOTE — Telephone Encounter (Signed)
Agree with advice given

## 2021-08-01 ENCOUNTER — Telehealth (INDEPENDENT_AMBULATORY_CARE_PROVIDER_SITE_OTHER): Payer: Medicare Other | Admitting: Internal Medicine

## 2021-08-01 ENCOUNTER — Encounter: Payer: Self-pay | Admitting: Internal Medicine

## 2021-08-01 ENCOUNTER — Other Ambulatory Visit: Payer: Self-pay

## 2021-08-01 DIAGNOSIS — K5100 Ulcerative (chronic) pancolitis without complications: Secondary | ICD-10-CM | POA: Diagnosis not present

## 2021-08-01 DIAGNOSIS — A040 Enteropathogenic Escherichia coli infection: Secondary | ICD-10-CM | POA: Diagnosis not present

## 2021-08-01 DIAGNOSIS — A045 Campylobacter enteritis: Secondary | ICD-10-CM

## 2021-08-01 NOTE — Patient Instructions (Signed)
Colitis  Colitis is a condition in which the colon is inflamed. It can cause diarrhea, blood in the stool, and abdominal pain. Colitis can last a short time (be acute), or it may last a long time (become chronic). What are the causes? This condition may be caused by: Infections from viruses or bacteria. A reaction to medicine. Certain autoimmune diseases, such as Crohn's disease or ulcerative colitis. Radiation treatment. Decreased blood flow to the bowel (ischemia). What are the signs or symptoms? Symptoms of this condition include: Diarrhea, blood in the stool, or black, tarry stool. Pain in the joints or abdominal pain. Fever or fatigue. Vomiting. Weight loss. Bloating. Having fewer bowel movements than usual. A strong and sudden urge to have a bowel movement. Feeling like the bowel is not empty after a bowel movement. How is this diagnosed? This condition may be diagnosed based on a stool test and a blood test. You may also have other tests, such as: X-rays. CT scan. Colonoscopy. Endoscopy. Biopsy. How is this treated? Treatment for this condition depends on the cause. This condition may be treated with: Steps to rest the bowel, such as not eating or drinking for a period of time. Fluids that are given through an IV. Medicine for pain and diarrhea. Antibiotic medicines. Cortisone medicines. Surgery. Follow these instructions at home: Eating and drinking  Follow instructions from your health care provider about eating or drinking restrictions. Drink enough fluid to keep your urine pale yellow. Work with a dietitian to determine whether certain foods cause your condition to flare up. Avoid foods or drinks that cause flare-ups. Eat a well-balanced diet.  General instructions If you were prescribed an antibiotic medicine, take it as told by your health care provider. Do not stop taking the antibiotic even if you start to feel better. Take over-the-counter and  prescription medicines only as told by your health care provider. Keep all follow-up visits. This is important. Contact a health care provider if: Your symptoms do not go away. You develop new symptoms. Get help right away if: You have a fever that does not go away with treatment. You develop chills. You have extreme weakness, fainting, or dehydration. You vomit repeatedly. You develop severe pain in your abdomen. You pass bloody or tarry stool. Summary Colitis is a condition in which the colon is inflamed. Colitis can last a short time (be acute), or it may last a long time (become chronic). Treatment for this condition depends on the cause and may include resting the bowel, taking medicines, or having surgery. If you were prescribed an antibiotic medicine, take it as told by your health care provider. Do not stop taking the antibiotic even if you start to feel better. Get help right away if you develop severe pain in your abdomen. Keep all follow-up visits. This is important. This information is not intended to replace advice given to you by your health care provider. Make sure you discuss any questions you have with your healthcare provider. Document Revised: 07/30/2020 Document Reviewed: 07/30/2020 Elsevier Patient Education  [place].

## 2021-08-01 NOTE — Progress Notes (Signed)
Virtual Visit via Video Note  I connected with Eric Andrews on 08/01/21 at  4:00 PM EDT by a video enabled telemedicine application and verified that I am speaking with the correct person using two identifiers.  Location: Patient: Home Provider: Office  Persons participating in this video call: Webb Silversmith, NP  and Armondo Warrell.   I discussed the limitations of evaluation and management by telemedicine and the availability of in person appointments. The patient expressed understanding and agreed to proceed.  History of Present Illness:  Patient reports fever, body aches and diarrhea.  He reports this started 8/23.  He went to urgent care for the same.  They advised him to go to the ER for further evaluation.  At the ER.  His temp was 101.5.  He was having multiple loose stools per day without noticeable blood.  He reported pain in his left upper, left lower and right lower abdomen.  CT abdomen showed diarrheal pancolitis.  Stool culture was negative for C. difficile.  But did show E. coli and Campylobacter.  He was given a Rx for Azithromycin, Septra and probiotics.  He has been taking the medication as prescribed.  He does not feel like he has been running any fevers but still has had body aches in the morning.  He reports multiple loose stools per day without blood but reports this has been improving.  He reports his abdominal pain is not as severe.  He does not have a gastroenterologist in this area.  His last colonoscopy was in 2019.  Past Medical History:  Diagnosis Date   ADD (attention deficit disorder)    Anxiety    Arthritis    Depression    Diabetes mellitus without complication (HCC)    Enlarged prostate    Hypertension    Knee pain    Sleep apnea     Current Outpatient Medications  Medication Sig Dispense Refill   aspirin 81 MG EC tablet Take 81 mg by mouth daily.     atorvastatin (LIPITOR) 20 MG tablet TAKE 1 TABLET BY MOUTH AT BEDTIME 90 tablet 3   azithromycin  (ZITHROMAX) 500 MG tablet Take 1 tablet (500 mg total) by mouth daily for 7 days. Take 1 tablet daily for 3 days. 7 tablet 0   Bacillus Coagulans-Inulin (PROBIOTIC) 1-250 BILLION-MG CAPS Take 1 capsule by mouth daily. 30 capsule 1   busPIRone (BUSPAR) 10 MG tablet Take 1 tablet (10 mg total) by mouth 2 (two) times daily. 180 tablet 1   celecoxib (CELEBREX) 200 MG capsule Take 1 capsule (200 mg total) by mouth 2 (two) times daily. 180 capsule 1   cetirizine (ZYRTEC) 10 MG tablet Take 1 tablet (10 mg total) by mouth at bedtime. 90 tablet 3   chlorthalidone (HYGROTON) 25 MG tablet Take 1 tablet (25 mg total) by mouth daily. 90 tablet 1   cholecalciferol (VITAMIN D3) 25 MCG (1000 UNIT) tablet Take 1,000 Units by mouth daily.     dapagliflozin propanediol (FARXIGA) 5 MG TABS tablet Take by mouth.     FARXIGA 10 MG TABS tablet Take 10 mg by mouth daily before breakfast. 30 tablet 0   fenofibrate micronized (LOFIBRA) 134 MG capsule Take 1 capsule (134 mg total) by mouth daily before breakfast. 90 capsule 3   finasteride (PROSCAR) 5 MG tablet TAKE 1 TABLET(5 MG) BY MOUTH EVERY EVENING 90 tablet 3   gabapentin (NEURONTIN) 400 MG capsule Take 1 capsule by mouth 3 (three) times daily.  gabapentin (NEURONTIN) 400 MG capsule Take by mouth.     insulin degludec (TRESIBA FLEXTOUCH) 100 UNIT/ML FlexTouch Pen Inject 68 Units into the skin at bedtime.     Insulin Pen Needle 31G X 5 MM MISC Use with insulin to inject into skin 3 times daily as directed 400 each 3   Insulin Syringe-Needle U-100 25G X 1" 1 ML MISC Use to inject Humalog insulin up to 3 times daily with meal 300 each 11   losartan (COZAAR) 50 MG tablet Take 1 tablet (50 mg total) by mouth daily. 90 tablet 3   metFORMIN (GLUCOPHAGE-XR) 500 MG 24 hr tablet Take 2 tablets (1,000 mg total) by mouth 2 (two) times daily with a meal. 360 tablet 1   metFORMIN (GLUCOPHAGE-XR) 500 MG 24 hr tablet Take by mouth.     Multiple Vitamin (MULTIVITAMIN) capsule Take  1 capsule by mouth daily.     NOVOLOG FLEXPEN 100 UNIT/ML FlexPen Inject 14 Units into the skin 3 (three) times daily with meals. Sliding scale 15 mL 11   Omega-3 Fatty Acids (FISH OIL) 1000 MG CAPS Take by mouth in the morning and at bedtime.     omeprazole (PRILOSEC) 20 MG capsule Take 1 capsule (20 mg total) by mouth daily before breakfast. 90 capsule 3   OZEMPIC, 0.25 OR 0.5 MG/DOSE, 2 MG/1.5ML SOPN Inject 0.5 mg into the skin once a week. 1.5 mL 2   pioglitazone (ACTOS) 15 MG tablet TAKE 1 TABLET(15 MG) BY MOUTH DAILY 90 tablet 0   sulfamethoxazole-trimethoprim (BACTRIM DS) 800-160 MG tablet Take 1 tablet by mouth 2 (two) times daily. 14 tablet 0   tamsulosin (FLOMAX) 0.4 MG CAPS capsule TAKE ONE CAPSULE BY MOUTH EVERY NIGHT AT BEDTIME FOR URINE RETENTION 90 capsule 1   traZODone (DESYREL) 100 MG tablet Take 1 tablet (100 mg total) by mouth at bedtime. 90 tablet 3   venlafaxine XR (EFFEXOR-XR) 150 MG 24 hr capsule Take 150 mg by mouth daily with breakfast.     No current facility-administered medications for this visit.    Allergies  Allergen Reactions   Dust Mite Extract     Family History  Problem Relation Age of Onset   Heart attack Mother 74   Lung cancer Father    Alzheimer's disease Brother    Heart attack Maternal Grandfather 34    Social History   Socioeconomic History   Marital status: Married    Spouse name: Not on file   Number of children: Not on file   Years of education: Not on file   Highest education level: Not on file  Occupational History   Occupation: retired  Tobacco Use   Smoking status: Never   Smokeless tobacco: Never  Vaping Use   Vaping Use: Never used  Substance and Sexual Activity   Alcohol use: Not Currently   Drug use: Never   Sexual activity: Not Currently  Other Topics Concern   Not on file  Social History Narrative   Not on file   Social Determinants of Health   Financial Resource Strain: Low Risk    Difficulty of Paying  Living Expenses: Not hard at all  Food Insecurity: No Food Insecurity   Worried About Charity fundraiser in the Last Year: Never true   Ran Out of Food in the Last Year: Never true  Transportation Needs: No Transportation Needs   Lack of Transportation (Medical): No   Lack of Transportation (Non-Medical): No  Physical Activity: Inactive  Days of Exercise per Week: 0 days   Minutes of Exercise per Session: 0 min  Stress: No Stress Concern Present   Feeling of Stress : Not at all  Social Connections: Not on file  Intimate Partner Violence: Not on file     Constitutional: Patient reports body aches.  Denies fever headache or abrupt weight changes.  Respiratory: Denies difficulty breathing, shortness of breath, cough or sputum production.   Cardiovascular: Denies chest pain, chest tightness, palpitations or swelling in the hands or feet.  Gastrointestinal: Patient reports diarrhea.  Denies abdominal pain, bloating, constipation, or blood in the stool.   No other specific complaints in a complete review of systems (except as listed in HPI above).    Observations/Objective:  Wt Readings from Last 3 Encounters:  07/29/21 (!) 310 lb (140.6 kg)  07/29/21 (!) 310 lb (140.6 kg)  07/15/21 (!) 308 lb (139.7 kg)    General: In NAD. Pulmonary/Chest: Normal effort. No respiratory distress.  Neurological: Alert and oriented.   BMET    Component Value Date/Time   NA 133 (L) 07/29/2021 1932   NA 140 12/19/2019 0000   K 3.5 07/29/2021 1932   CL 92 (L) 07/29/2021 1932   CO2 28 07/29/2021 1932   GLUCOSE 211 (H) 07/29/2021 1932   BUN 21 07/29/2021 1932   BUN 27 (A) 12/19/2019 0000   CREATININE 1.20 07/29/2021 1932   CREATININE 1.12 07/01/2021 0809   CALCIUM 9.5 07/29/2021 1932   GFRNONAA >60 07/29/2021 1932    Lipid Panel     Component Value Date/Time   CHOL 122 07/01/2021 0809   TRIG 209 (H) 07/01/2021 0809   HDL 33 (L) 07/01/2021 0809   CHOLHDL 3.7 07/01/2021 0809   LDLCALC  62 07/01/2021 0809    CBC    Component Value Date/Time   WBC 8.6 07/29/2021 1932   RBC 5.62 07/29/2021 1932   HGB 16.2 07/29/2021 1932   HCT 46.8 07/29/2021 1932   PLT 141 (L) 07/29/2021 1932   MCV 83.3 07/29/2021 1932   MCH 28.8 07/29/2021 1932   MCHC 34.6 07/29/2021 1932   RDW 13.6 07/29/2021 1932   LYMPHSABS 1,601 07/01/2021 0809   EOSABS 292 07/01/2021 0809   BASOSABS 80 07/01/2021 0809    Hgb A1C Lab Results  Component Value Date   HGBA1C 8.5 (H) 07/01/2021       Assessment and Plan:  ER follow-up for Campylobacter, E. coli Pancolitis:  ER notes, labs and imaging reviewed Continue azithromycin and Septra as prescribed Continue probiotic Encouraged him to consume a bland diet If no improvement by Monday, we will have him follow-up with GI for possible colonoscopy ER precautions discussed  Follow Up Instructions:    I discussed the assessment and treatment plan with the patient. The patient was provided an opportunity to ask questions and all were answered. The patient agreed with the plan and demonstrated an understanding of the instructions.   The patient was advised to call back or seek an in-person evaluation if the symptoms worsen or if the condition fails to improve as anticipated.  Webb Silversmith, NP

## 2021-08-09 ENCOUNTER — Other Ambulatory Visit: Payer: Self-pay | Admitting: Family Medicine

## 2021-08-09 DIAGNOSIS — E1169 Type 2 diabetes mellitus with other specified complication: Secondary | ICD-10-CM

## 2021-08-09 DIAGNOSIS — Z794 Long term (current) use of insulin: Secondary | ICD-10-CM

## 2021-08-10 DIAGNOSIS — G4733 Obstructive sleep apnea (adult) (pediatric): Secondary | ICD-10-CM | POA: Diagnosis not present

## 2021-08-12 DIAGNOSIS — M5412 Radiculopathy, cervical region: Secondary | ICD-10-CM | POA: Diagnosis not present

## 2021-08-12 DIAGNOSIS — M5441 Lumbago with sciatica, right side: Secondary | ICD-10-CM | POA: Diagnosis not present

## 2021-08-12 DIAGNOSIS — M9931 Osseous stenosis of neural canal of cervical region: Secondary | ICD-10-CM | POA: Diagnosis not present

## 2021-08-12 DIAGNOSIS — M48062 Spinal stenosis, lumbar region with neurogenic claudication: Secondary | ICD-10-CM | POA: Diagnosis not present

## 2021-08-12 DIAGNOSIS — G8929 Other chronic pain: Secondary | ICD-10-CM | POA: Diagnosis not present

## 2021-08-12 DIAGNOSIS — M542 Cervicalgia: Secondary | ICD-10-CM | POA: Diagnosis not present

## 2021-08-12 DIAGNOSIS — M5442 Lumbago with sciatica, left side: Secondary | ICD-10-CM | POA: Diagnosis not present

## 2021-09-08 DIAGNOSIS — R2681 Unsteadiness on feet: Secondary | ICD-10-CM | POA: Diagnosis not present

## 2021-09-09 DIAGNOSIS — G4733 Obstructive sleep apnea (adult) (pediatric): Secondary | ICD-10-CM | POA: Diagnosis not present

## 2021-09-22 ENCOUNTER — Ambulatory Visit: Payer: Medicare Other | Admitting: Dermatology

## 2021-09-22 ENCOUNTER — Other Ambulatory Visit: Payer: Self-pay

## 2021-09-22 DIAGNOSIS — D692 Other nonthrombocytopenic purpura: Secondary | ICD-10-CM

## 2021-09-22 DIAGNOSIS — L821 Other seborrheic keratosis: Secondary | ICD-10-CM | POA: Diagnosis not present

## 2021-09-22 DIAGNOSIS — D229 Melanocytic nevi, unspecified: Secondary | ICD-10-CM | POA: Diagnosis not present

## 2021-09-22 DIAGNOSIS — L219 Seborrheic dermatitis, unspecified: Secondary | ICD-10-CM | POA: Diagnosis not present

## 2021-09-22 DIAGNOSIS — S8991XA Unspecified injury of right lower leg, initial encounter: Secondary | ICD-10-CM

## 2021-09-22 DIAGNOSIS — W228XXA Striking against or struck by other objects, initial encounter: Secondary | ICD-10-CM

## 2021-09-22 DIAGNOSIS — D1800 Hemangioma unspecified site: Secondary | ICD-10-CM | POA: Diagnosis not present

## 2021-09-22 DIAGNOSIS — H01139 Eczematous dermatitis of unspecified eye, unspecified eyelid: Secondary | ICD-10-CM

## 2021-09-22 DIAGNOSIS — T1490XA Injury, unspecified, initial encounter: Secondary | ICD-10-CM

## 2021-09-22 DIAGNOSIS — L578 Other skin changes due to chronic exposure to nonionizing radiation: Secondary | ICD-10-CM

## 2021-09-22 DIAGNOSIS — L814 Other melanin hyperpigmentation: Secondary | ICD-10-CM

## 2021-09-22 DIAGNOSIS — L820 Inflamed seborrheic keratosis: Secondary | ICD-10-CM | POA: Diagnosis not present

## 2021-09-22 DIAGNOSIS — L738 Other specified follicular disorders: Secondary | ICD-10-CM

## 2021-09-22 DIAGNOSIS — L308 Other specified dermatitis: Secondary | ICD-10-CM

## 2021-09-22 MED ORDER — HYDROCORTISONE 2.5 % EX CREA: TOPICAL_CREAM | CUTANEOUS | 6 refills | Status: DC

## 2021-09-22 MED ORDER — KETOCONAZOLE 2 % EX CREA: TOPICAL_CREAM | CUTANEOUS | 6 refills | Status: DC

## 2021-09-22 NOTE — Progress Notes (Signed)
New Patient Visit  Subjective  Eric Andrews is a 70 y.o. male who presents for the following: Rash (On the face and ears scale and erythema - present for years, patient has tried Ketoconazole 2% cream and HC 2.5% cream in the past which has helped. He would like to discuss treatment options today. ). He also has irregular irritated skin lesion on the chest and L arm that he would like checked today, and lesions on the forehead that he would like to remove.  The following portions of the chart were reviewed this encounter and updated as appropriate:   Tobacco  Allergies  Meds  Problems  Med Hx  Surg Hx  Fam Hx     Review of Systems:  No other skin or systemic complaints except as noted in HPI or Assessment and Plan.  Objective  Well appearing patient in no apparent distress; mood and affect are within normal limits.  A focused examination was performed including the face, scalp, neck, trunk, and extremities. Relevant physical exam findings are noted in the Assessment and Plan.  Face and ears Pink patches with greasy scale.   B/L eyelid dermatitis Erythema and scale.   chest x 2, R areola x 4, L forearm x 1 (7) Erythematous keratotic or waxy stuck-on papule or plaque.   Forehead (10) Small yellow papules with a central dell.      Right Lower Leg - Anterior Healing and excoriations on the B/L legs.    Assessment & Plan  Seborrheic dermatitis Face and ears Seborrheic Dermatitis with eyelid dermatitis -  -  is a chronic persistent rash characterized by pinkness and scaling most commonly of the mid face but also can occur on the scalp (dandruff), ears; mid chest, mid back and groin.  It tends to be exacerbated by stress and cooler weather.  People who have neurologic disease may experience new onset or exacerbation of existing seborrheic dermatitis.  The condition is not curable but treatable and can be controlled.  Start HC 2.5% cream QD on Monday, Wednesday, and Friday  alternating with Ketoconazole 2% cream QD on Tuesdays, Thursday, and Saturday.   hydrocortisone 2.5 % cream - Face and ears Apply to aa's of face and ears QD on Monday, Wednesday, and Friday. ketoconazole (NIZORAL) 2 % cream - Face and ears Apply to aa's face and ears QD on Tuesday, Thursday, and Saturday.  Other specified dermatitis B/L eyelid dermatitis Eyelid dermatitis, likely part of seborrheic dermatitis vs contact dermatitis  See treatment plan for seborrheic dermatitis.  If not improving at f/u consider patch testing.   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  Fee is $60 for the first and $15 for each additional.  Patient understand this is an out-of-pocket cost. Consent form/noncovered procedure form signed.  Wounds and injuries Right Lower Leg - Anterior Due to accidentally hitting them on objects -  Advised patient that  his skin is likely easily damaged due to age and thinner skin, but condition is benign and not cancerous.    Seborrheic Keratoses - Stuck-on, waxy, tan-brown papules and/or plaques  - Benign-appearing - Discussed benign etiology and prognosis. - Observe - Call for any changes  Purpura - Chronic; persistent and recurrent.  Treatable, but not curable. - Violaceous macules and patches - Benign - Related to trauma, age, sun damage and/or use of  blood thinners, chronic use of topical and/or oral steroids - Observe - Can use OTC arnica containing moisturizer such as Dermend Bruise Formula if desired - Call for worsening or other concerns  Melanocytic Nevi - Tan-brown and/or pink-flesh-colored symmetric macules and papules - Benign appearing on exam today - Observation - Call clinic for new or changing moles - Recommend daily use of broad spectrum spf 30+ sunscreen to sun-exposed areas.   Lentigines - Scattered tan macules - Due to sun exposure - Benign-appering, observe - Recommend daily broad spectrum sunscreen SPF 30+ to sun-exposed areas, reapply every 2 hours as needed. - Call for any changes  Hemangiomas - Red papules - Discussed benign nature - Observe - Call for any changes  Actinic Damage - chronic, secondary to cumulative UV radiation exposure/sun exposure over time - diffuse scaly erythematous macules with underlying dyspigmentation - Recommend daily broad spectrum sunscreen SPF 30+ to sun-exposed areas, reapply every 2 hours as needed.  - Recommend staying in the shade or wearing long sleeves, sun glasses (UVA+UVB protection) and wide brim hats (4-inch brim around the entire circumference of the hat). - Call for new or changing lesions.  Return in about 3 months (around 12/23/2021) for SD of the face and ears.  Luther Redo, CMA, am acting as scribe for Sarina Ser, MD . Documentation: I have reviewed the above documentation for accuracy and completeness, and I agree with the above.  Sarina Ser, MD

## 2021-09-22 NOTE — Patient Instructions (Signed)

## 2021-09-23 ENCOUNTER — Encounter: Payer: Self-pay | Admitting: Dermatology

## 2021-09-23 ENCOUNTER — Telehealth: Payer: Self-pay

## 2021-09-23 NOTE — Telephone Encounter (Signed)
I called and notified the patient that his Novolog came in and is available for pick up at his convenience. He said he will come tomorrow to pick it up.

## 2021-10-03 ENCOUNTER — Other Ambulatory Visit: Payer: Self-pay | Admitting: Family Medicine

## 2021-10-03 DIAGNOSIS — N401 Enlarged prostate with lower urinary tract symptoms: Secondary | ICD-10-CM

## 2021-10-03 NOTE — Telephone Encounter (Signed)
Requested Prescriptions  Pending Prescriptions Disp Refills  . tamsulosin (FLOMAX) 0.4 MG CAPS capsule [Pharmacy Med Name: TAMSULOSIN 0.4MG  CAPSULES] 90 capsule 0    Sig: TAKE ONE CAPSULE BY MOUTH EVERY NIGHT AT BEDTIME FOR URINE RETENTION     Urology: Alpha-Adrenergic Blocker Passed - 10/03/2021  6:20 AM      Passed - Last BP in normal range    BP Readings from Last 1 Encounters:  07/30/21 139/70         Passed - Valid encounter within last 12 months    Recent Outpatient Visits          2 months ago Intestinal infection due to enteropathogenic E. coli   [facility] [place], Coralie Keens, NP   2 months ago Annual physical exam   [facility] [name], DO   5 months ago [place], DO   7 months ago Type 2 diabetes mellitus with other specified complication, with long-term current use of insulin [facility])   [facility] [place], [name], DO   11 months ago Type 2 diabetes mellitus with other specified complication, with long-term current use of insulin ([place])   [facility] [name], [name], DO      Future Appointments            In 2 months Ralene Bathe, MD [place]   In 9 months  [facility], [facility]

## 2021-10-10 ENCOUNTER — Other Ambulatory Visit: Payer: Self-pay | Admitting: Family Medicine

## 2021-10-10 DIAGNOSIS — E1169 Type 2 diabetes mellitus with other specified complication: Secondary | ICD-10-CM

## 2021-10-10 DIAGNOSIS — E785 Hyperlipidemia, unspecified: Secondary | ICD-10-CM

## 2021-10-10 DIAGNOSIS — G4733 Obstructive sleep apnea (adult) (pediatric): Secondary | ICD-10-CM | POA: Diagnosis not present

## 2021-10-10 DIAGNOSIS — Z794 Long term (current) use of insulin: Secondary | ICD-10-CM

## 2021-10-10 NOTE — Telephone Encounter (Signed)
Requested Prescriptions  Pending Prescriptions Disp Refills  . atorvastatin (LIPITOR) 20 MG tablet [Pharmacy Med Name: ATORVASTATIN 20MG  TABLETS] 90 tablet 2    Sig: TAKE 1 TABLET BY MOUTH AT BEDTIME     Cardiovascular:  Antilipid - Statins Failed - 10/10/2021  9:10 AM      Failed - HDL in normal range and within 360 days    HDL  Date Value Ref Range Status  07/01/2021 33 (L) > OR = 40 mg/dL Final         Failed - Triglycerides in normal range and within 360 days    Triglycerides  Date Value Ref Range Status  07/01/2021 209 (H) <150 mg/dL Final    Comment:    . If a non-fasting specimen was collected, consider repeat triglyceride testing on a fasting specimen if clinically indicated.  Yates Decamp et al. J. of Clin. Lipidol. [id number];1:696-789. Marland Kitchen          Passed - Total Cholesterol in normal range and within 360 days    Cholesterol  Date Value Ref Range Status  07/01/2021 122 <200 mg/dL Final         Passed - LDL in normal range and within 360 days    LDL Cholesterol (Calc)  Date Value Ref Range Status  07/01/2021 62 mg/dL (calc) Final    Comment:    Reference range: <100 . Desirable range <100 mg/dL for primary prevention;   <70 mg/dL for patients with CHD or diabetic patients  with > or = 2 CHD risk factors. Marland Kitchen LDL-C is now calculated using the Martin-Hopkins  calculation, which is a validated novel method providing  better accuracy than the Friedewald equation in the  estimation of LDL-C.  Cresenciano Genre et al. Annamaria Helling. 3810;175(10): 2061-2068  (http://education.[facility].com/faq/FAQ164)          Passed - Patient is not pregnant      Passed - Valid encounter within last 12 months    Recent Outpatient Visits          2 months ago Intestinal infection due to enteropathogenic E. coli   [facility] [place], Coralie Keens, NP   3 months ago Annual physical exam   [facility] [name], DO   5 months ago [place], DO   7 months ago Type 2 diabetes mellitus with other specified complication, with long-term current use of insulin [facility])   [facility] [name], DO   1 year ago Type 2 diabetes mellitus with other specified complication, with long-term current use of insulin [facility])   [facility] [name], [name], DO      Future Appointments            In 2 months Ralene Bathe, MD [place]   In 9 months  [facility], [place]  . atorvastatin (LIPITOR) 20 MG tablet [Pharmacy Med Name: ATORVASTATIN 20MG  TABLETS] 90 tablet 3    Sig: TAKE 1 TABLET BY MOUTH AT BEDTIME     Cardiovascular:  Antilipid - Statins Failed - 10/10/2021  9:10 AM      Failed - HDL in normal range and within 360 days    HDL  Date Value Ref Range Status  07/01/2021 33 (L) > OR = 40 mg/dL Final  Failed - Triglycerides in normal range and within 360 days    Triglycerides  Date Value Ref Range Status  07/01/2021 209 (H) <150 mg/dL Final    Comment:    . If a non-fasting specimen was collected, consider repeat triglyceride testing on a fasting specimen if clinically indicated.  Yates Decamp et al. J. of Clin. Lipidol. [id number];1:194-174. Marland Kitchen          Passed - Total Cholesterol in normal range and within 360 days    Cholesterol  Date Value Ref Range Status  07/01/2021 122 <200 mg/dL Final         Passed - LDL in normal range and within 360 days    LDL Cholesterol (Calc)  Date Value Ref Range Status  07/01/2021 62 mg/dL (calc) Final    Comment:    Reference range: <100 . Desirable range <100 mg/dL for primary prevention;   <70 mg/dL for patients with CHD or diabetic patients  with > or = 2 CHD risk factors. Marland Kitchen LDL-C is now calculated using the Martin-Hopkins  calculation, which is a validated novel method providing  better accuracy than the  Friedewald equation in the  estimation of LDL-C.  Cresenciano Genre et al. Annamaria Helling. 0814;481(85): 2061-2068  (http://education.[facility].com/faq/FAQ164)          Passed - Patient is not pregnant      Passed - Valid encounter within last 12 months    Recent Outpatient Visits          2 months ago Intestinal infection due to enteropathogenic E. coli   [facility] [place], Coralie Keens, NP   3 months ago Annual physical exam   [facility] [name], DO   5 months ago [place], DO   7 months ago Type 2 diabetes mellitus with other specified complication, with long-term current use of insulin [facility])   [name], DO   1 year ago Type 2 diabetes mellitus with other specified complication, with long-term current use of insulin [facility])   [facility] [name], DO      Future Appointments            In 2 months Ralene Bathe, MD [place]   In 9 months  [facility], [facility]

## 2021-10-21 DIAGNOSIS — R2681 Unsteadiness on feet: Secondary | ICD-10-CM | POA: Diagnosis not present

## 2021-10-21 DIAGNOSIS — M79602 Pain in left arm: Secondary | ICD-10-CM | POA: Diagnosis not present

## 2021-10-21 DIAGNOSIS — G479 Sleep disorder, unspecified: Secondary | ICD-10-CM | POA: Diagnosis not present

## 2021-10-21 DIAGNOSIS — I639 Cerebral infarction, unspecified: Secondary | ICD-10-CM | POA: Diagnosis not present

## 2021-10-21 DIAGNOSIS — M79662 Pain in left lower leg: Secondary | ICD-10-CM | POA: Diagnosis not present

## 2021-10-21 DIAGNOSIS — M542 Cervicalgia: Secondary | ICD-10-CM | POA: Diagnosis not present

## 2021-10-21 DIAGNOSIS — G4733 Obstructive sleep apnea (adult) (pediatric): Secondary | ICD-10-CM | POA: Diagnosis not present

## 2021-10-21 DIAGNOSIS — M79661 Pain in right lower leg: Secondary | ICD-10-CM | POA: Diagnosis not present

## 2021-10-21 DIAGNOSIS — R200 Anesthesia of skin: Secondary | ICD-10-CM | POA: Diagnosis not present

## 2021-10-21 DIAGNOSIS — M545 Low back pain, unspecified: Secondary | ICD-10-CM | POA: Diagnosis not present

## 2021-10-21 DIAGNOSIS — R202 Paresthesia of skin: Secondary | ICD-10-CM | POA: Diagnosis not present

## 2021-10-22 ENCOUNTER — Other Ambulatory Visit: Payer: Self-pay | Admitting: Neurology

## 2021-10-22 DIAGNOSIS — R202 Paresthesia of skin: Secondary | ICD-10-CM

## 2021-10-22 DIAGNOSIS — R0989 Other specified symptoms and signs involving the circulatory and respiratory systems: Secondary | ICD-10-CM

## 2021-10-22 DIAGNOSIS — M79602 Pain in left arm: Secondary | ICD-10-CM

## 2021-10-22 DIAGNOSIS — R200 Anesthesia of skin: Secondary | ICD-10-CM

## 2021-11-05 ENCOUNTER — Other Ambulatory Visit: Payer: Self-pay

## 2021-11-05 ENCOUNTER — Ambulatory Visit
Admission: RE | Admit: 2021-11-05 | Discharge: 2021-11-05 | Disposition: A | Discharge status: Home / Self Care | Payer: Medicare Other | Source: Ambulatory Visit | Attending: Neurology | Admitting: Neurology

## 2021-11-05 DIAGNOSIS — R202 Paresthesia of skin: Secondary | ICD-10-CM | POA: Diagnosis not present

## 2021-11-05 DIAGNOSIS — R0989 Other specified symptoms and signs involving the circulatory and respiratory systems: Secondary | ICD-10-CM | POA: Insufficient documentation

## 2021-11-05 DIAGNOSIS — M79602 Pain in left arm: Secondary | ICD-10-CM | POA: Insufficient documentation

## 2021-11-05 DIAGNOSIS — R200 Anesthesia of skin: Secondary | ICD-10-CM | POA: Diagnosis not present

## 2021-11-05 MED ORDER — GADOBUTROL 1 MMOL/ML IV SOLN
10.0000 mL | Freq: Once | INTRAVENOUS | Status: AC | PRN
  Administered 2021-11-05: 10 mL via INTRAVENOUS

## 2021-11-06 ENCOUNTER — Ambulatory Visit (INDEPENDENT_AMBULATORY_CARE_PROVIDER_SITE_OTHER): Payer: Medicare Other

## 2021-11-06 DIAGNOSIS — Z23 Encounter for immunization: Secondary | ICD-10-CM | POA: Diagnosis not present

## 2021-11-09 DIAGNOSIS — G4733 Obstructive sleep apnea (adult) (pediatric): Secondary | ICD-10-CM | POA: Diagnosis not present

## 2021-11-10 ENCOUNTER — Ambulatory Visit (INDEPENDENT_AMBULATORY_CARE_PROVIDER_SITE_OTHER): Payer: Medicare Other | Admitting: Pharmacist

## 2021-11-10 DIAGNOSIS — E1169 Type 2 diabetes mellitus with other specified complication: Secondary | ICD-10-CM

## 2021-11-10 DIAGNOSIS — Z794 Long term (current) use of insulin: Secondary | ICD-10-CM

## 2021-11-10 NOTE — Patient Instructions (Signed)
Visit Information  Thank you for taking time to visit with me today. Please don't hesitate to contact me if I can be of assistance to you before our next scheduled telephone appointment.  Following are the goals we discussed today:  Our goal A1c is less than 7%. This corresponds with fasting sugars less than 130 and 2 hour after meal sugars less than 180. Please check your blood sugar and keep a log of the results  Please check your home blood pressure, keep a log of the results and bring this with you to your medical appointments.  Our goal bad cholesterol, or LDL, is less than 70 . This is why it is important to continue taking your atorvastatin.   Wallace Cullens, PharmD, [place] [phone]  Our next appointment is by telephone on 12/17/2021 at 10:15 am  Please call the care guide team at [phone] if you need to cancel or reschedule your appointment.    The patient verbalized understanding of instructions, educational materials, and care plan provided today and declined offer to receive copy of patient instructions, educational materials, and care plan.

## 2021-11-10 NOTE — Chronic Care Management (AMB) (Signed)
Chronic Care Management CCM Pharmacy Note  11/10/2021 Name:  Eric Andrews MRN:  [id number] DOB:  Apr 04, 1951   Subjective: Eric Andrews is an 70 y.o. year old male who is a primary patient of [name], DO.  The CCM team was consulted for assistance with disease management and care coordination needs.    Engaged with patient by telephone for follow up visit for pharmacy case management and/or care coordination services.   Objective:  Medications Reviewed Today     Reviewed by Ralene Bathe, MD (Physician) on 09/23/21 at [place] List Status: <None>   Medication Order Taking? Sig Documenting Provider Last Dose Status Informant  aspirin 81 MG EC tablet [id number] Yes Take 81 mg by mouth daily. [provider] Taking Active   atorvastatin (LIPITOR) 20 MG tablet [id number] Yes TAKE 1 TABLET BY MOUTH AT BEDTIME Karamalegos, [name], DO Taking Active   Bacillus Coagulans-Inulin (PROBIOTIC) 1-250 BILLION-MG CAPS [id number] No Take 1 capsule by mouth daily.  Patient not taking: No sig reported   Ward, Delice Bison, DO Not Taking Active   busPIRone (BUSPAR) 10 MG tablet [id number] Yes Take 1 tablet (10 mg total) by mouth 2 (two) times daily. [name], DO Taking Active   celecoxib (CELEBREX) 200 MG capsule [id number] Yes Take 1 capsule (200 mg total) by mouth 2 (two) times daily. [name], DO Taking Active   cetirizine (ZYRTEC) 10 MG tablet [id number] Yes Take 1 tablet (10 mg total) by mouth at bedtime. [name], DO Taking Active   chlorthalidone (HYGROTON) 25 MG tablet [id number] Yes TAKE 1 TABLET(25 MG) BY MOUTH DAILY Karamalegos, [name], DO Taking Active   cholecalciferol (VITAMIN D3) 25 MCG (1000 UNIT) tablet [id number] Yes Take 1,000 Units by mouth daily. [provider] Taking Active   FARXIGA 10 MG TABS tablet [id number] Yes Take 10 mg by mouth daily before breakfast. [name], DO Taking Active    fenofibrate micronized (LOFIBRA) 134 MG capsule [id number] Yes Take 1 capsule (134 mg total) by mouth daily before breakfast. [name], DO Taking Active   finasteride (PROSCAR) 5 MG tablet [id number] Yes TAKE 1 TABLET(5 MG) BY MOUTH EVERY EVENING Karamalegos, [name], DO Taking Active   gabapentin (NEURONTIN) 400 MG capsule [id number] Yes Take 1 capsule by mouth 3 (three) times daily. [provider] Taking Active   gabapentin (NEURONTIN) 400 MG capsule [id number]  Take by mouth. [provider]  Expired 08/13/21 2359   hydrocortisone 2.5 % cream [id number] Yes Apply to aa's of face and ears QD on Monday, Wednesday, and Friday. Ralene Bathe, MD  Active   insulin degludec [facility]) 100 UNIT/ML FlexTouch Pen [id number] Yes Inject 68 Units into the skin at bedtime. [provider] Taking Active   Insulin Pen Needle 31G X 5 MM MISC [id number] Yes Use with insulin to inject into skin 3 times daily as directed [name], DO Taking Active   ketoconazole (NIZORAL) 2 % cream [id number] Yes Apply to aa's face and ears QD on Tuesday, Thursday, and Saturday. Ralene Bathe, MD  Active   losartan (COZAAR) 50 MG tablet [id number] Yes Take 1 tablet (50 mg total) by mouth daily. [name], DO Taking Active   metFORMIN (GLUCOPHAGE-XR) 500 MG 24 hr tablet [id number] Yes TAKE 2 TABLETS(1000 MG) BY MOUTH TWICE DAILY WITH A MEAL Karamalegos, [name], DO Taking Active   Multiple Vitamin (MULTIVITAMIN) capsule [id number] Yes Take 1 capsule  by mouth daily. [provider] Taking Active   NOVOLOG FLEXPEN 100 UNIT/ML FlexPen [id number] Yes Inject 14 Units into the skin 3 (three) times daily with meals. Sliding scale [name], DO Taking Active   Omega-3 Fatty Acids (FISH OIL) 1000 MG CAPS [id number] Yes Take by mouth in the morning and at bedtime. [provider] Taking Active   omeprazole (PRILOSEC) 20 MG  capsule [id number] Yes Take 1 capsule (20 mg total) by mouth daily before breakfast. [name], DO Taking Active   OZEMPIC, 0.25 OR 0.5 MG/DOSE, 2 MG/1.5ML SOPN [id number] Yes Inject 0.5 mg into the skin once a week.  Patient taking differently: Inject 1 mg into the skin once a week.   [name], DO Taking Active   pioglitazone (ACTOS) 15 MG tablet [id number] Yes TAKE 1 TABLET(15 MG) BY MOUTH DAILY [name], [name], DO Taking Active   sulfamethoxazole-trimethoprim (BACTRIM DS) 800-160 MG tablet [id number] No Take 1 tablet by mouth 2 (two) times daily.  Patient not taking: Reported on 09/22/2021   Ward, Delice Bison, DO Not Taking Active   tamsulosin (FLOMAX) 0.4 MG CAPS capsule [id number] Yes TAKE ONE CAPSULE BY MOUTH EVERY NIGHT AT BEDTIME FOR URINE RETENTION [name], DO Taking Active   traZODone (DESYREL) 100 MG tablet [id number] Yes Take 1 tablet (100 mg total) by mouth at bedtime. [name], DO Taking Active   venlafaxine XR (EFFEXOR-XR) 150 MG 24 hr capsule [id number] Yes Take 150 mg by mouth daily with breakfast. [provider] Taking Active             Pertinent Labs:  Lab Results  Component Value Date   HGBA1C 8.5 (H) 07/01/2021   Lab Results  Component Value Date   CHOL 122 07/01/2021   HDL 33 (L) 07/01/2021   LDLCALC 62 07/01/2021   TRIG 209 (H) 07/01/2021   CHOLHDL 3.7 07/01/2021   Lab Results  Component Value Date   CREATININE 1.20 07/29/2021   BUN 21 07/29/2021   NA 133 (L) 07/29/2021   K 3.5 07/29/2021   CL 92 (L) 07/29/2021   CO2 28 07/29/2021    SDOH:  (Social Determinants of Health) assessments and interventions performed:    CCM Care Plan  Review of patient past medical history, allergies, medications, health status, including review of consultants reports, laboratory and other test data, was performed as part of comprehensive evaluation and provision of chronic care management  services.   Care Plan : PharmD - Disease Management T2DM, osteoarthritis  Updates made by Rennis Petty, RPH-CPP since 11/10/2021 12:00 AM     Problem: Disease Progression      Long-Range Goal: Disease Progression Prevented or Minimized   Start Date: 11/06/2020  Expected End Date: 01/05/2021  This Visit's Progress: On track  Recent Progress: On track  Priority: High  Note:   Current Barriers:  Unable to independently afford treatment regimen, particularly when enters coverage gap of [employer] Part D plan Patient APPROVED for patient assistance from [employer] for [place], [place] through 12/06/2021 Patient APPROVED for patient assistance from [employer] for [place] through 12/06/2021  Pharmacist Clinical Goal(s):  Over the next 60 days, patient will verbalize ability to afford treatment regimen through collaboration with PharmD and provider.   Interventions: 1:1 collaboration with [name], DO regarding development and update of comprehensive plan of care as evidenced by provider attestation and co-signature Inter-disciplinary care team collaboration (see longitudinal plan of care)  Perform chart review. Patient see for Office Visit with [facility] on 11/15. Provider advised patient: Increase Gabapentin to 600 mg four times per day.  MRI of brain to evaluate for stroke Completed on 11/05/2021 Continue Trazodone 100 mg, refilled. Walk 15-30 minutes per day Today caution patient for increased risk of dizziness and sedation with increase of gabapentin dose Reports has had some dizziness with gabapentin, but being cautious and increasing slowly to dose recommended by Neurologist   Type 2 Diabetes  Uncontrolled; current treatment Tresiba 68 units daily in evening  Previous direction from PCP regarding fasting sugar reading in AM: if consistently < 150 reduce by about 2 units per 3 days or more Novolog - Reported taking  three times daily with meals following sliding scale from previous Endocrinologist in [place] Reported sliding scale based on blood sugar prior to each meal: If less than 140: takes 13 units If 140-200: takes 14 units If 200-250: takes 15 units If 250 or above: takes 16 units Metformin ER 500 mg - 2 tablets (1000 mg) twice daily Pioglitazone 15 mg once daily Ozempic 0.5 mg - Reports started using 2 injections of 0.5 mg (total of 1 mg) under the skin weekly (started ~2 months ago) Reports tolerating well Discuss importance of administering as different sites and rotating sites of his injections Farxiga 10 mg daily  Lipid management: atorvastatin 20 mg daily Recalls recent home fasting and bedtime blood sugars ranging: ~111-150 Denies hypoglycemia Have discussed role of diet and exercise on blood sugar control Reports has noticed his higher blood sugar readings tied to larger portion sizes when stressed Exercise: limited by neuropathy in legs; walking during errands and using stationary pedaling Have counseled patient on importance of guidance from provider for adjustment of medications; patient has expressed preference for more self-management of his blood sugar control.   Hypertension Reports taking: Chlorthalidone 25 mg daily Losartan 50 mg daily Reports recent home blood pressure readings ranging: 110s-120s Note patient monitors home blood pressure with wrist monitor Have encouraged patient to obtain upper arm blood pressure monitor for greater accuracy of upper arm vs wrist monitor Patient denied need for upper arm monitor. States prefers wrist monitor Encouraged patient to monitor home blood pressure, keep log of results and to contact office for reading outside of established parameters or symptoms  Medication Assistance: Reports has sufficient supply of Farxiga, Tresiba, Novolog and Ozempic to last until end of current calendar year. Will collaborate with [place]  for aid to patient with reapplying for patient assistance from [employer] and [employer] for 2023 calendar year Counsel patient that patient portion of [employer] will be automatically completed by program; no action required on his part for AZ[employer]Me re-enrollment Note for re-enrollment to receive Ozempic from [employer], will plan for Rx for Ozempic 1 mg weekly - inject 1 mg under the skin once weekly  Patient Goals/Self-Care Activities Over the next 60 days, patient will:  - collaborate with provider on medication access solutions - patient to call office for new or worsening medical concerns - attend medical appointments as scheduled  Follow Up Plan: Telephone follow up appointment with care management team member scheduled for: 12/17/2021 at 10:15 am      Wallace Cullens, PharmD, [place], [place] [phone]

## 2021-11-11 ENCOUNTER — Telehealth: Payer: Self-pay | Admitting: Pharmacy Technician

## 2021-11-11 DIAGNOSIS — Z596 Low income: Secondary | ICD-10-CM

## 2021-11-11 NOTE — Progress Notes (Signed)
[place] [facility])                                            [place] Team    11/11/2021  Jaion Lagrange 08-02-1951 [id number]  FOR 2023 RE ENROLLMENT                                      Medication Assistance Referral  Referral From: [facility] Embedded RPh Dorthula Perfect   Medication/Company: Larna Daughters / [employer] Patient application portion:  Education officer, museum portion: Faxed  to Dr. [name] Provider address/fax verified via: Office website  Medication/Company: Tyler Aas / [employer] Patient application portion:  Education officer, museum portion: Faxed  to Dr. [name] Provider address/fax verified via: Office website  Medication/Company: Cira Servant / [employer] Patient application portion:  Education officer, museum portion: Faxed  to Dr. [name] Provider address/fax verified via: Office website  Medication/Company: Wilder Glade / [employer] Patient application portion:  N/A patient was enrolled in 2022 and has Medicare Part D therfore per program guidelines will automatically be re enrolled for 3818 Provider application portion: Faxed  to Dr. [name] Provider address/fax verified via: Office website    Mylik Pro P. Jaquel Glassburn, [place]  [phone]

## 2021-12-06 DIAGNOSIS — E785 Hyperlipidemia, unspecified: Secondary | ICD-10-CM

## 2021-12-06 DIAGNOSIS — E1169 Type 2 diabetes mellitus with other specified complication: Secondary | ICD-10-CM

## 2021-12-06 DIAGNOSIS — Z794 Long term (current) use of insulin: Secondary | ICD-10-CM

## 2021-12-10 ENCOUNTER — Telehealth: Payer: Self-pay | Admitting: Pharmacy Technician

## 2021-12-10 DIAGNOSIS — Z596 Low income: Secondary | ICD-10-CM

## 2021-12-10 NOTE — Progress Notes (Signed)
[place] [facility])                                            [place] Team    12/10/2021  Kenshin Splawn 23-Jun-1951 [id number]  Received both patient and provider portion(s) of patient assistance application(s) for [employer] and [place]. Faxed completed application and required documents into [employer] for the 1st 3 listed meds and to [employer]ME for [place].  Tae Vonada P. Charlisha Market, [place]  [phone]

## 2021-12-12 ENCOUNTER — Telehealth: Payer: Self-pay | Admitting: Pharmacist

## 2021-12-12 NOTE — Telephone Encounter (Signed)
°  Chronic Care Management   Outreach Note  12/12/2021 Name: Eric Andrews MRN: [id number] DOB: 11-Sep-1951  Referred by: [name], DO Reason for referral : No chief complaint on file.  Receive a call from patient requesting a call back.   Was unable to reach patient via telephone today and have left HIPAA compliant voicemail asking patient to return my call.   Follow Up Plan: Next CCM Pharmacist telephone outreach to patient scheduled for 12/17/2021 at 10:15 AM  Wallace Cullens, PharmD, [place] [facility] [phone]

## 2021-12-17 ENCOUNTER — Encounter: Payer: Self-pay | Admitting: Family Medicine

## 2021-12-17 ENCOUNTER — Ambulatory Visit (INDEPENDENT_AMBULATORY_CARE_PROVIDER_SITE_OTHER): Payer: Medicare Other | Admitting: Family Medicine

## 2021-12-17 ENCOUNTER — Telehealth: Payer: Self-pay | Admitting: Pharmacist

## 2021-12-17 ENCOUNTER — Other Ambulatory Visit: Payer: Self-pay | Admitting: Family Medicine

## 2021-12-17 ENCOUNTER — Telehealth: Payer: Medicare Other

## 2021-12-17 VITALS — BP 134/76 | HR 83 | Ht 70.0 in | Wt 314.6 lb

## 2021-12-17 DIAGNOSIS — F331 Major depressive disorder, recurrent, moderate: Secondary | ICD-10-CM

## 2021-12-17 DIAGNOSIS — M25562 Pain in left knee: Secondary | ICD-10-CM | POA: Diagnosis not present

## 2021-12-17 DIAGNOSIS — M170 Bilateral primary osteoarthritis of knee: Secondary | ICD-10-CM | POA: Diagnosis not present

## 2021-12-17 DIAGNOSIS — M25561 Pain in right knee: Secondary | ICD-10-CM

## 2021-12-17 DIAGNOSIS — M23207 Derangement of unspecified meniscus due to old tear or injury, left knee: Secondary | ICD-10-CM

## 2021-12-17 DIAGNOSIS — G8929 Other chronic pain: Secondary | ICD-10-CM

## 2021-12-17 MED ORDER — VENLAFAXINE HCL ER 150 MG PO CP24: 150.0000 mg | ORAL_CAPSULE | Freq: Every day | ORAL | 3 refills | Status: DC

## 2021-12-17 MED ORDER — TRAZODONE HCL 100 MG PO TABS: 100.0000 mg | ORAL_TABLET | Freq: Every day | ORAL | 3 refills | Status: DC

## 2021-12-17 NOTE — Telephone Encounter (Signed)
°  Chronic Care Management   Outreach Note  12/17/2021 Name: Eric Andrews MRN: [id number] DOB: [birth year]  Referred by: [name], DO Reason for referral : No chief complaint on file.  Was unable to reach patient via telephone today and have left HIPAA compliant voicemail asking patient to return my call.    Follow Up Plan: Will collaborate with Care Guide to outreach to schedule follow up with me  Wallace Cullens, PharmD, [place] [facility] [phone]

## 2021-12-17 NOTE — Patient Instructions (Signed)
Thank you for coming to the office today.  Last time seen July 2021 they did a cortisone injection R knee, and X-rays.  You have severe arthritis in both knees, Left is worse than Right.  Call Orthopedics to try to get a follow-up appointment, discuss the viscosupplementation gel injection or other therapy options.   [facility]   [place], [place] [address]   [phone]   Lauris Poag, MD   [address]   [facility] [place], [place] [address]   [phone]   [phone] ([address])     Fel[address], [place]   [place], [place] [address]   [phone]        Please schedule a Follow-up Appointment to: No follow-ups on file.  If you have any other questions or concerns, please feel free to call the office or send a message through [place]. You may also schedule an earlier appointment if necessary.  Additionally, you may be receiving a survey about your experience at our office within a few days to 1 week by e-mail or mail. We value your feedback.  Nobie Putnam, DO [place]

## 2021-12-17 NOTE — Addendum Note (Signed)
Addended by: [name] on: 12/17/2021 03:26 PM   Modules accepted: Orders

## 2021-12-17 NOTE — Progress Notes (Signed)
Subjective:    Patient ID: Eric Andrews, male    DOB: 15-Mar-1951, 71 y.o.   MRN: [id number]  Eric Andrews is a 71 y.o. male presenting on 12/17/2021 for Knee Pain   HPI  Bilateral Knee Pain  Left Knee Meniscus injury, with grinding and bone on bone  Right Knee Pain and osteoarthritis  He says originally diagnosed 5-6 years ago, with arthritis. He managed and dealt with the knee pain, and tried not to miss work. Now he is retired History of previous Orthopedics Personal assistant) - seen 06/2020 w/ Dr Rudene Christians and Rachelle Hora PA, he had X-rays see below results, he was given R knee cortisone injection and they offered future viscosupplementation.  - previous ortho in [place] advised him for Left knee TKR surgery but he did not pursue due to active at work.  Also - He is followed by Dr Melrose Nakayama, and has chronic severe neuropathy of legs.  He will work with therapist for weight management in future.  Home BP monitor, he will check this more often. He admits was riding in car with granddaughter who was driving caused him to be stressed Repeat   Depression screen [facility] 2/9 12/17/2021 07/15/2021 07/08/2021  Decreased Interest 1 0 1  Down, Depressed, Hopeless 0 0 1  PHQ - 2 Score 1 0 2  Altered sleeping 0 - 1  Tired, decreased energy 1 - 1  Change in appetite 1 - 3  Feeling bad or failure about yourself  1 - 2  Trouble concentrating 0 - 2  Moving slowly or fidgety/restless 0 - 2  Suicidal thoughts 0 - 1  PHQ-9 Score 4 - 14  Difficult doing work/chores Not difficult at all - Very difficult    Social History   Tobacco Use   Smoking status: Never   Smokeless tobacco: Never  Vaping Use   Vaping Use: Never used  Substance Use Topics   Alcohol use: Not Currently   Drug use: Never    Review of Systems Per HPI unless specifically indicated above     Objective:    BP 134/76 (BP Location: Left Arm, Cuff Size: Normal)    Pulse 83    Ht 5\' 10"  (1.778 m)    Wt (!) 314 lb 9.6 oz (142.7 kg)     SpO2 98%    BMI 45.14 kg/m   Wt Readings from Last 3 Encounters:  12/17/21 (!) 314 lb 9.6 oz (142.7 kg)  07/29/21 (!) 310 lb (140.6 kg)  07/29/21 (!) 310 lb (140.6 kg)    Physical Exam Vitals and nursing note reviewed.  Constitutional:      General: He is not in acute distress.    Appearance: Normal appearance. He is well-developed. He is obese. He is not diaphoretic.     Comments: Well-appearing, comfortable, cooperative  HENT:     Head: Normocephalic and atraumatic.  Eyes:     General:        Right eye: No discharge.        Left eye: No discharge.     Conjunctiva/sclera: Conjunctivae normal.  Cardiovascular:     Rate and Rhythm: Normal rate.  Pulmonary:     Effort: Pulmonary effort is normal.  Musculoskeletal:     Comments: Antalgic gait due to knee pain Reduced ROM extension, crepitus positive, pain medial R knee  Skin:    General: Skin is warm and dry.     Findings: No erythema or rash.  Neurological:     Mental  Status: He is alert and oriented to person, place, and time.  Psychiatric:        Mood and Affect: Mood normal.        Behavior: Behavior normal.        Thought Content: Thought content normal.     Comments: Well groomed, good eye contact, normal speech and thoughts    Report from Orthopedics 06/21/20 [place]  AP lateral sunrise views the right knee are ordered interpreted by me in the office today. Impression: Patient has moderate joint space narrowing in the medial compartment with subchondral sclerosing. Severe patellofemoral degenerative changes and spurring with normal tracking of patella and trochlear groove. Mild lateral compartment spurring no valgus or varus forming.  AP lateral sunrise views the left knee are ordered interpreted by me in the office today. Impression: Patient has 80 to 90% loss of joint space in medial compartment with slight varus deformity. Subchondral changes noted medial compartment. Patellofemoral arthritic changes noted.  Patella tracks well the trochlear groove. No evidence of acute bony abnormality.  Results for orders placed or performed during the hospital encounter of 07/30/21  Resp Panel by RT-PCR (Flu A&B, Covid) Nasopharyngeal Swab   Specimen: Nasopharyngeal Swab; Nasopharyngeal(NP) swabs in vial transport medium  Result Value Ref Range   SARS Coronavirus 2 by RT PCR NEGATIVE NEGATIVE   Influenza A by PCR NEGATIVE NEGATIVE   Influenza B by PCR NEGATIVE NEGATIVE  Gastrointestinal Panel by PCR , Stool   Specimen: Stool  Result Value Ref Range   Campylobacter species DETECTED (A) NOT DETECTED   Plesimonas shigelloides NOT DETECTED NOT DETECTED   Salmonella species NOT DETECTED NOT DETECTED   Yersinia enterocolitica NOT DETECTED NOT DETECTED   Vibrio species NOT DETECTED NOT DETECTED   Vibrio cholerae NOT DETECTED NOT DETECTED   Enteroaggregative E coli (EAEC) NOT DETECTED NOT DETECTED   Enteropathogenic E coli (EPEC) DETECTED (A) NOT DETECTED   Enterotoxigenic E coli (ETEC) NOT DETECTED NOT DETECTED   Shiga like toxin producing E coli (STEC) NOT DETECTED NOT DETECTED   Shigella/Enteroinvasive E coli (EIEC) NOT DETECTED NOT DETECTED   Cryptosporidium NOT DETECTED NOT DETECTED   Cyclospora cayetanensis NOT DETECTED NOT DETECTED   Entamoeba histolytica NOT DETECTED NOT DETECTED   Giardia lamblia NOT DETECTED NOT DETECTED   Adenovirus F40/41 NOT DETECTED NOT DETECTED   Astrovirus NOT DETECTED NOT DETECTED   Norovirus GI/GII NOT DETECTED NOT DETECTED   Rotavirus A NOT DETECTED NOT DETECTED   Sapovirus (I, II, IV, and V) NOT DETECTED NOT DETECTED  C Difficile Quick Screen w PCR reflex   Specimen: Stool  Result Value Ref Range   C Diff antigen NEGATIVE NEGATIVE   C Diff toxin NEGATIVE NEGATIVE   C Diff interpretation No C. difficile detected.   Lipase, blood  Result Value Ref Range   Lipase 34 11 - 51 U/L  Comprehensive metabolic panel  Result Value Ref Range   Sodium 133 (L) 135 - 145 mmol/L    Potassium 3.5 3.5 - 5.1 mmol/L   Chloride 92 (L) 98 - 111 mmol/L   CO2 28 22 - 32 mmol/L   Glucose, Bld 211 (H) 70 - 99 mg/dL   BUN 21 8 - 23 mg/dL   Creatinine, Ser 1.20 0.61 - 1.24 mg/dL   Calcium 9.5 8.9 - 10.3 mg/dL   Total Protein 7.1 6.5 - 8.1 g/dL   Albumin 3.6 3.5 - 5.0 g/dL   AST 28 15 - 41 U/L   ALT  42 0 - 44 U/L   Alkaline Phosphatase 33 (L) 38 - 126 U/L   Total Bilirubin 0.8 0.3 - 1.2 mg/dL   GFR, Estimated >60 >60 mL/min   Anion gap 13 5 - 15  CBC  Result Value Ref Range   WBC 8.6 4.0 - 10.5 K/uL   RBC 5.62 4.22 - 5.81 MIL/uL   Hemoglobin 16.2 13.0 - 17.0 g/dL   HCT 46.8 39.0 - 52.0 %   MCV 83.3 80.0 - 100.0 fL   MCH 28.8 26.0 - 34.0 pg   MCHC 34.6 30.0 - 36.0 g/dL   RDW 13.6 11.5 - 15.5 %   Platelets 141 (L) 150 - 400 K/uL   nRBC 0.0 0.0 - 0.2 %  Urinalysis, Complete w Microscopic  Result Value Ref Range   Color, Urine YELLOW (A) YELLOW   APPearance CLEAR (A) CLEAR   Specific Gravity, Urine 1.031 (H) 1.005 - 1.030   pH 6.0 5.0 - 8.0   Glucose, UA >=500 (A) NEGATIVE mg/dL   Hgb urine dipstick NEGATIVE NEGATIVE   Bilirubin Urine NEGATIVE NEGATIVE   Ketones, ur 5 (A) NEGATIVE mg/dL   Protein, ur 30 (A) NEGATIVE mg/dL   Nitrite NEGATIVE NEGATIVE   Leukocytes,Ua NEGATIVE NEGATIVE   RBC / HPF 0-5 0 - 5 RBC/hpf   WBC, UA 0-5 0 - 5 WBC/hpf   Bacteria, UA NONE SEEN NONE SEEN   Squamous Epithelial / LPF 0-5 0 - 5   Mucus PRESENT   Troponin I (High Sensitivity)  Result Value Ref Range   Troponin I (High Sensitivity) 15 <18 ng/L  Troponin I (High Sensitivity)  Result Value Ref Range   Troponin I (High Sensitivity) 15 <18 ng/L      Assessment & Plan:   Problem List Items Addressed This Visit     Chronic pain of both knees - Primary   Bilateral primary osteoarthritis of knee   Other Visit Diagnoses     Other old tear of meniscus of left knee, unspecified meniscus           Known chronic progressive severe arthritis in both knees, Left is worse than  Right. Reviewed report from [place] both knees 1.5 years ago Has failed cortisone injections and other therapy  Next Ortho offered visco supplement injections Future Ortho had recommended surgery TKR, patient is hesitant  Advised he should contact [facility] again to return and follow-up, given severity of his knees.  No orders of the defined types were placed in this encounter.   Follow up plan: No follow-ups on file.  Forward to [place] out of ozempic, already PAP program  Nobie Putnam, [place] [facility] 12/17/2021, 3:04 PM

## 2021-12-18 NOTE — Addendum Note (Signed)
Addended by: [name] on: 12/18/2021 06:52 AM   Modules accepted: Level of Service

## 2021-12-19 ENCOUNTER — Ambulatory Visit (INDEPENDENT_AMBULATORY_CARE_PROVIDER_SITE_OTHER): Payer: Medicare Other | Admitting: Pharmacist

## 2021-12-19 DIAGNOSIS — E1169 Type 2 diabetes mellitus with other specified complication: Secondary | ICD-10-CM

## 2021-12-19 MED ORDER — OZEMPIC (1 MG/DOSE) 4 MG/3ML ~~LOC~~ SOPN: 1.0000 mg | PEN_INJECTOR | SUBCUTANEOUS | 0 refills | Status: DC

## 2021-12-19 NOTE — Patient Instructions (Signed)
Visit Information  Thank you for taking time to visit with me today. Please don't hesitate to contact me if I can be of assistance to you before our next scheduled telephone appointment.  Following are the goals we discussed today:   Goals Addressed             This Visit's Progress    Pharmacy Goals       Our goal A1c is less than 7%. This corresponds with fasting sugars less than 130 and 2 hour after meal sugars less than 180. Please check your blood sugar and keep a log of the results  Please check your home blood pressure, keep a log of the results and bring this with you to your medical appointments.  Our goal bad cholesterol, or LDL, is less than 70 . This is why it is important to continue taking your atorvastatin.   Wallace Cullens, PharmD, [place] [phone]         Our next appointment is by telephone on 01/05/2022 at 10 am  Please call the care guide team at [phone] if you need to cancel or reschedule your appointment.    The patient verbalized understanding of instructions, educational materials, and care plan provided today and declined offer to receive copy of patient instructions, educational materials, and care plan.

## 2021-12-19 NOTE — Chronic Care Management (AMB) (Signed)
Chronic Care Management CCM Pharmacy Note  12/19/2021 Name:  Eric Andrews MRN:  [id number] DOB:  [birth year]   Subjective: Eric Andrews is an 71 y.o. year old male who is a primary patient of [name], DO.  The CCM team was consulted for assistance with disease management and care coordination needs.    Engaged with patient by telephone for follow up visit for pharmacy case management and/or care coordination services.   Objective:  Medications Reviewed Today     Reviewed by [name], DO (Physician) on 12/17/21 at [place] [facility] List Status: <None>   Medication Order Taking? Sig Documenting Provider Last Dose Status Informant  aspirin 81 MG EC tablet [id number] Yes Take 81 mg by mouth daily. [provider] Taking Active   atorvastatin (LIPITOR) 20 MG tablet [id number] Yes TAKE 1 TABLET BY MOUTH AT BEDTIME Karamalegos, [name], DO Taking Active   Bacillus Coagulans-Inulin (PROBIOTIC) 1-250 BILLION-MG CAPS [id number] No Take 1 capsule by mouth daily.  Patient not taking: Reported on 08/01/2021   Ward, Delice Bison, DO Not Taking Active   busPIRone (BUSPAR) 10 MG tablet [id number] Yes Take 1 tablet (10 mg total) by mouth 2 (two) times daily. [name], DO Taking Active   celecoxib (CELEBREX) 200 MG capsule [id number] Yes Take 1 capsule (200 mg total) by mouth 2 (two) times daily. [name], DO Taking Active   cetirizine (ZYRTEC) 10 MG tablet [id number] Yes Take 1 tablet (10 mg total) by mouth at bedtime. [name], DO Taking Active   chlorthalidone (HYGROTON) 25 MG tablet [id number] Yes TAKE 1 TABLET(25 MG) BY MOUTH DAILY Karamalegos, [name], DO Taking Active   cholecalciferol (VITAMIN D3) 25 MCG (1000 UNIT) tablet [id number] Yes Take 1,000 Units by mouth daily. [provider] Taking Active   FARXIGA 10 MG TABS tablet [id number] Yes Take 10 mg by mouth daily before breakfast. [name], DO  Taking Active   fenofibrate micronized (LOFIBRA) 134 MG capsule [id number] Yes Take 1 capsule (134 mg total) by mouth daily before breakfast. [name], DO Taking Active   finasteride (PROSCAR) 5 MG tablet [id number] Yes TAKE 1 TABLET(5 MG) BY MOUTH EVERY EVENING Karamalegos, [name], DO Taking Active   gabapentin (NEURONTIN) 600 MG tablet [id number] Yes Take by mouth. Take 1 tablet (600 mg total) by mouth 4 (four) times daily [provider] Taking Active   hydrocortisone 2.5 % cream [id number] Yes Apply to aa's of face and ears QD on Monday, Wednesday, and Friday. Ralene Bathe, MD Taking Active   insulin degludec [facility]) 100 UNIT/ML FlexTouch Pen [id number] Yes Inject 68 Units into the skin at bedtime. [provider] Taking Active   Insulin Pen Needle 31G X 5 MM MISC [id number] Yes Use with insulin to inject into skin 3 times daily as directed [name], DO Taking Active   ketoconazole (NIZORAL) 2 % cream [id number] Yes Apply to aa's face and ears QD on Tuesday, Thursday, and Saturday. Ralene Bathe, MD Taking Active   losartan (COZAAR) 50 MG tablet [id number] Yes Take 1 tablet (50 mg total) by mouth daily. [name], DO Taking Active   metFORMIN (GLUCOPHAGE-XR) 500 MG 24 hr tablet [id number] Yes TAKE 2 TABLETS(1000 MG) BY MOUTH TWICE DAILY WITH A MEAL Karamalegos, [name], DO Taking Active   Multiple Vitamin (MULTIVITAMIN) capsule [id number] Yes Take 1 capsule by mouth daily. [provider] Taking Active   NOVOLOG FLEXPEN 100  UNIT/ML FlexPen [id number] Yes Inject 14 Units into the skin 3 (three) times daily with meals. Sliding scale [name], DO Taking Active   Omega-3 Fatty Acids (FISH OIL) 1000 MG CAPS [id number] Yes Take by mouth in the morning and at bedtime. [provider] Taking Active   omeprazole (PRILOSEC) 20 MG capsule [id number] Yes Take 1 capsule (20 mg total) by mouth daily  before breakfast. [name], DO Taking Active   OZEMPIC, 0.25 OR 0.5 MG/DOSE, 2 MG/1.5ML SOPN [id number] Yes Inject 0.5 mg into the skin once a week.  Patient taking differently: Inject 1 mg into the skin once a week.   [name], DO Taking Active   pioglitazone (ACTOS) 15 MG tablet [id number] Yes TAKE 1 TABLET(15 MG) BY MOUTH DAILY [name], DO Taking Active   tamsulosin (FLOMAX) 0.4 MG CAPS capsule [id number] Yes TAKE ONE CAPSULE BY MOUTH EVERY NIGHT AT BEDTIME FOR URINE RETENTION [name], DO Taking Active   traZODone (DESYREL) 100 MG tablet [id number] Yes Take 1 tablet (100 mg total) by mouth at bedtime. [name], DO Taking Active   venlafaxine XR (EFFEXOR-XR) 150 MG 24 hr capsule [id number] Yes Take 150 mg by mouth daily with breakfast. [provider] Taking Active             Pertinent Labs:  Lab Results  Component Value Date   HGBA1C 8.5 (H) 07/01/2021   Lab Results  Component Value Date   CHOL 122 07/01/2021   HDL 33 (L) 07/01/2021   LDLCALC 62 07/01/2021   TRIG 209 (H) 07/01/2021   CHOLHDL 3.7 07/01/2021   Lab Results  Component Value Date   CREATININE 1.20 07/29/2021   BUN 21 07/29/2021   NA 133 (L) 07/29/2021   K 3.5 07/29/2021   CL 92 (L) 07/29/2021   CO2 28 07/29/2021    SDOH:  (Social Determinants of Health) assessments and interventions performed:    CCM Care Plan  Review of patient past medical history, allergies, medications, health status, including review of consultants reports, laboratory and other test data, was performed as part of comprehensive evaluation and provision of chronic care management services.   Care Plan : PharmD - Disease Management T2DM, osteoarthritis  Updates made by Rennis Petty, RPH-CPP since 12/19/2021 12:00 AM     Problem: Disease Progression      Long-Range Goal: Disease Progression Prevented or Minimized   Start Date: 11/06/2020   Expected End Date: 01/05/2021  Recent Progress: On track  Priority: High  Note:   Current Barriers:  Unable to independently afford treatment regimen, particularly when enters coverage gap of [employer] Part D plan Patient APPROVED for patient assistance from [employer] for [place], [place] through 12/06/2022 Patient APPROVED for patient assistance from [employer] for [place] through 12/06/2021  Pharmacist Clinical Goal(s):  Over the next 60 days, patient will verbalize ability to afford treatment regimen through collaboration with PharmD and provider.   Interventions: 1:1 collaboration with [name], DO regarding development and update of comprehensive plan of care as evidenced by provider attestation and co-signature Inter-disciplinary care team collaboration (see longitudinal plan of care) Perform chart review. Patient seen for Office Visit with PCP on 1/11 for knee pain. Provider advised patient to follow up with Orthopedics Received a message from PCP letting me know patient is out of Ozempic from assistance program. Office did not have a sample available for patient Today patient reports has scheduled appointment  with [place] on 1/27  Medication Assistance: Collaborating with [place] for aid to patient with reapplying for patient assistance from [employer] and [employer] for 2023 calendar year From review of chart, note [facility] CPh[facility] faxed re-enrollment application to [employer] on 12/10/2021 Today patient states he received a letter from [employer] yesterday stating that they need more info for his application, not yet approved for re-enrollment Reports he is now out of Ozempic Patient declines phone number for assistance program. Denies interest in obtaining a supply of the medication through prescription coverage as feels blood sugars currently doing okay and prefers to wait on receiving medication from assistance  program Outreach to [employer] patient assistance program on behalf of patient today. Speak with representative Shanon Payor, who advises patient's application received, but not yet approved as one line on application did not come through on fax. Read line of application to representative from original.  Patient APPROVED today for re-enrollment in [employer] for [place], [place] for 2023 calendar year. Representative advises medications to ship to patient in ~30 days Advise that patient is out of Ozempic. Representative states that patient may call back on/after 1/17 (processing time) to see if a voucher for this medication is available at that time Follow up with patient to let him know and provide number for patient to call back about the voucher [phone]) CPP sends Rx for 1 month supply of Ozempic to patient's local pharmacy for use if voucher available  Type 2 Diabetes  Uncontrolled; current treatment Tresiba 68 units daily in evening  Previous direction from PCP regarding fasting sugar reading in AM: if consistently < 150 reduce by about 2 units per 3 days or more Novolog - has reported taking three times daily with meals following sliding scale from previous Endocrinologist in [place] Reported sliding scale based on blood sugar prior to each meal: If less than 140: takes 13 units If 140-200: takes 14 units If 200-250: takes 15 units If 250 or above: takes 16 units Metformin ER 500 mg - 2 tablets (1000 mg) twice daily Pioglitazone 15 mg once daily Farxiga 10 mg daily  Note patient now out of Ozempic 1 mg weekly Lipid management: atorvastatin 20 mg daily Reports recent home morning fasting blood sugars ranging: 120-180 Have discussed role of diet and exercise on blood sugar control Has previously observed his higher blood sugar readings tied to larger portion sizes when stressed Exercise: currently limited by pain in his knees Have counseled on s/s of hypoglycemia  and how to manage low blood sugars Have counseled patient on importance of guidance from provider for adjustment of medications; patient has expressed preference for more self-management of his blood sugar control.   Hypertension Reports taking: Chlorthalidone 25 mg daily Losartan 50 mg daily Note patient monitors home blood pressure with wrist monitor Have encouraged patient to obtain upper arm blood pressure monitor for greater accuracy of upper arm vs wrist monitor Patient denied need for upper arm monitor. States prefers wrist monitor Encouraged patient to monitor home blood pressure, keep log of results and to contact office for reading outside of established parameters or symptoms   Patient Goals/Self-Care Activities Over the next 60 days, patient will:  - collaborate with provider on medication access solutions - patient to call office for new or worsening medical concerns - attend medical appointments as scheduled  Follow Up Plan: Telephone follow up appointment with care management team member scheduled for: 01/05/2022 at 10 am  Wallace Cullens, PharmD, [place], CPP Clinical Pharmacist [facility] [phone]

## 2021-12-22 DIAGNOSIS — M542 Cervicalgia: Secondary | ICD-10-CM | POA: Diagnosis not present

## 2021-12-22 DIAGNOSIS — M79602 Pain in left arm: Secondary | ICD-10-CM | POA: Diagnosis not present

## 2021-12-22 DIAGNOSIS — G479 Sleep disorder, unspecified: Secondary | ICD-10-CM | POA: Diagnosis not present

## 2021-12-22 DIAGNOSIS — R200 Anesthesia of skin: Secondary | ICD-10-CM | POA: Diagnosis not present

## 2021-12-22 DIAGNOSIS — R2689 Other abnormalities of gait and mobility: Secondary | ICD-10-CM | POA: Diagnosis not present

## 2021-12-22 DIAGNOSIS — G4733 Obstructive sleep apnea (adult) (pediatric): Secondary | ICD-10-CM | POA: Diagnosis not present

## 2021-12-24 ENCOUNTER — Other Ambulatory Visit: Payer: Self-pay

## 2021-12-24 ENCOUNTER — Ambulatory Visit: Payer: Medicare Other | Admitting: Dermatology

## 2021-12-24 DIAGNOSIS — L578 Other skin changes due to chronic exposure to nonionizing radiation: Secondary | ICD-10-CM | POA: Diagnosis not present

## 2021-12-24 DIAGNOSIS — H01131 Eczematous dermatitis of right upper eyelid: Secondary | ICD-10-CM

## 2021-12-24 DIAGNOSIS — L738 Other specified follicular disorders: Secondary | ICD-10-CM

## 2021-12-24 DIAGNOSIS — H01134 Eczematous dermatitis of left upper eyelid: Secondary | ICD-10-CM | POA: Diagnosis not present

## 2021-12-24 DIAGNOSIS — L219 Seborrheic dermatitis, unspecified: Secondary | ICD-10-CM | POA: Diagnosis not present

## 2021-12-24 DIAGNOSIS — L820 Inflamed seborrheic keratosis: Secondary | ICD-10-CM | POA: Diagnosis not present

## 2021-12-24 MED ORDER — FLUOCINOLONE ACETONIDE 0.01 % OT OIL: 1.0000 [drp] | TOPICAL_OIL | OTIC | 1 refills | Status: DC

## 2021-12-24 NOTE — Patient Instructions (Signed)

## 2021-12-24 NOTE — Progress Notes (Signed)
Follow-Up Visit   Subjective  Eric Andrews is a 71 y.o. male who presents for the following: Seborrheic Dermatitis (Face, ears, 24m f/u, no symptoms, but not much improvement HC 2.5% cr 3d/wk, Ketoconazole 2% cr 3d/wk). The patient has spots, moles and lesions to be evaluated, some may be new or changing and the patient has concerns that these could be cancer.  The following portions of the chart were reviewed this encounter and updated as appropriate:   Tobacco   Allergies   Meds   Problems   Med Hx   Surg Hx   Fam Hx      Review of Systems:  No other skin or systemic complaints except as noted in HPI or Assessment and Plan.  Objective  Well appearing patient in no apparent distress; mood and affect are within normal limits.  A focused examination was performed including face. Relevant physical exam findings are noted in the Assessment and Plan.  face, ears Mild scale ears, mild pinkness face  bil upper eyelids Eyelids clear  Neck - Anterior Yellow lobulated pap with central dele     L forearm x 1, R neck x 3 (4) Stuck on waxy paps with erythema    Assessment & Plan  Seborrheic dermatitis face, ears  Chronic, persistent, Improved, not to goal Seborrheic Dermatitis  -  is a chronic persistent rash characterized by pinkness and scaling most commonly of the mid face but also can occur on the scalp (dandruff), ears; mid chest, mid back and groin.  It tends to be exacerbated by stress and cooler weather.  People who have neurologic disease may experience new onset or exacerbation of existing seborrheic dermatitis.  The condition is not curable but treatable and can be controlled.  Start Dermotic oil 1d/wk to ears on sundays Cont HC 2.5% cr 3d/wk Monday, Wednesday, Friday Cont Ketoconazole 2% cr 3d/wk Tuesday, Thursday, Saturday  Fluocinolone Acetonide (DERMOTIC) 0.01 % OIL - face, ears Place 1 drop in ear(s) once a week. Apply 1 -2 gtts to ears on Sundays  Related  Medications hydrocortisone 2.5 % cream Apply to aa's of face and ears QD on Monday, Wednesday, and Friday.  ketoconazole (NIZORAL) 2 % cream Apply to aa's face and ears QD on Tuesday, Thursday, and Saturday.  Eczematous dermatitis of upper eyelids of both eyes bil upper eyelids  Clear today, Improved with treatment  Cont HC 2.5% cr up to 3x/wk prn flares Cont Ketoconazole 2% cr 3x/wk prn flares  Sebaceous hyperplasia Neck - Anterior  Discussed cosmetic procedure, noncovered.  $60 for 1st lesion and $15 for each additional lesion if done on the same day.  Maximum charge $350.  One touch-up treatment included no charge. Discussed risks of treatment including dyspigmentation, small scar, and/or recurrence. Recommend daily broad spectrum sunscreen SPF 30+/photoprotection to treated areas once healed.   ED x 1 Non covered ABN signed and will be scanned in chart.  Destruction of lesion - Neck - Anterior Complexity: simple   Destruction method comment:  Electrodesiccation Informed consent: discussed and consent obtained   Timeout:  patient name, date of birth, surgical site, and procedure verified Patient was prepped and draped in usual sterile fashion: patient was prepped with isopropyl alcohol. Outcome: patient tolerated procedure well with no complications    Inflamed seborrheic keratosis (4) L forearm x 1, R neck x 3  Destruction of lesion - L forearm x 1, R neck x 3 Complexity: simple   Destruction method: cryotherapy   Informed  consent: discussed and consent obtained   Timeout:  patient name, date of birth, surgical site, and procedure verified Lesion destroyed using liquid nitrogen: Yes   Region frozen until ice ball extended beyond lesion: Yes   Outcome: patient tolerated procedure well with no complications   Post-procedure details: wound care instructions given    Actinic Damage - chronic, secondary to cumulative UV radiation exposure/sun exposure over time - diffuse  scaly erythematous macules with underlying dyspigmentation - Recommend daily broad spectrum sunscreen SPF 30+ to sun-exposed areas, reapply every 2 hours as needed.  - Recommend staying in the shade or wearing long sleeves, sun glasses (UVA+UVB protection) and wide brim hats (4-inch brim around the entire circumference of the hat). - Call for new or changing lesions.  Return in about 3 months (around 03/24/2022) for TBSE, f/u [facility] derm.  I, Eric Andrews, RMA, am acting as scribe for Sarina Ser, MD . Documentation: I have reviewed the above documentation for accuracy and completeness, and I agree with the above.  Sarina Ser, MD

## 2021-12-26 ENCOUNTER — Telehealth: Payer: Self-pay | Admitting: Pharmacy Technician

## 2021-12-26 DIAGNOSIS — Z596 Low income: Secondary | ICD-10-CM

## 2021-12-26 NOTE — Progress Notes (Signed)
[facility])                                            [place] Team    12/26/2021  Alfred Harrel Mar 30, 1951 [id number]  2 care coordination calls placed to [employer] in regard to [place], [place] and Novolog application and to [employer] in regard to [place] application.  Spoke to [place] who informs patient was APPROVED 12/07/21-12/06/22. Refills will be processed automatically based on last fill date in 2022 and going forward with delivery to the provider's office.   Spoke to [place] at [place] who informs patient is APPROVED 12/07/21-12/06/22. Olegario Shearer informs refills will be placed automatically based on last fill date in 2022 and going forward with delivery to the patient's home.  Linday Rhodes P. Malikye Reppond, [place]  [phone]

## 2021-12-30 ENCOUNTER — Encounter: Payer: Self-pay | Admitting: Dermatology

## 2021-12-30 DIAGNOSIS — M9931 Osseous stenosis of neural canal of cervical region: Secondary | ICD-10-CM | POA: Diagnosis not present

## 2021-12-30 DIAGNOSIS — M48062 Spinal stenosis, lumbar region with neurogenic claudication: Secondary | ICD-10-CM | POA: Diagnosis not present

## 2021-12-30 DIAGNOSIS — M542 Cervicalgia: Secondary | ICD-10-CM | POA: Diagnosis not present

## 2021-12-30 DIAGNOSIS — M5441 Lumbago with sciatica, right side: Secondary | ICD-10-CM | POA: Diagnosis not present

## 2021-12-30 DIAGNOSIS — G8929 Other chronic pain: Secondary | ICD-10-CM | POA: Diagnosis not present

## 2021-12-30 DIAGNOSIS — M5412 Radiculopathy, cervical region: Secondary | ICD-10-CM | POA: Diagnosis not present

## 2021-12-30 DIAGNOSIS — M5442 Lumbago with sciatica, left side: Secondary | ICD-10-CM | POA: Diagnosis not present

## 2022-01-01 ENCOUNTER — Other Ambulatory Visit: Payer: Self-pay | Admitting: Family Medicine

## 2022-01-01 DIAGNOSIS — M159 Polyosteoarthritis, unspecified: Secondary | ICD-10-CM

## 2022-01-01 DIAGNOSIS — M5136 Other intervertebral disc degeneration, lumbar region: Secondary | ICD-10-CM

## 2022-01-01 DIAGNOSIS — N401 Enlarged prostate with lower urinary tract symptoms: Secondary | ICD-10-CM

## 2022-01-01 NOTE — Telephone Encounter (Signed)
Requested Prescriptions  Pending Prescriptions Disp Refills   tamsulosin (FLOMAX) 0.4 MG CAPS capsule [Pharmacy Med Name: TAMSULOSIN 0.4MG  CAPSULES] 90 capsule 0    Sig: TAKE ONE CAPSULE BY MOUTH EVERY NIGHT AT BEDTIME FOR URINE RETENTION     Urology: Alpha-Adrenergic Blocker Passed - 01/01/2022  6:21 AM      Passed - Last BP in normal range    BP Readings from Last 1 Encounters:  12/17/21 134/76         Passed - Valid encounter within last 12 months    Recent Outpatient Visits          2 weeks ago Chronic pain of both knees   [facility] [place], [name], DO   5 months ago Intestinal infection due to enteropathogenic E. coli   [facility] [place], Coralie Keens, NP   5 months ago Annual physical exam   [facility] [place], [name], DO   8 months ago [place], DO   10 months ago Type 2 diabetes mellitus with other specified complication, with long-term current use of insulin ([place])   [facility] [name], [name], DO      Future Appointments            In 2 months Ralene Bathe, MD [place]   In 6 months  [facility], [facility]            celecoxib (CELEBREX) 200 MG capsule [Pharmacy Med Name: CELECOXIB 200MG  CAPSULES] 180 capsule 0    Sig: TAKE 1 CAPSULE(200 MG) BY MOUTH TWICE DAILY     Analgesics:  COX2 Inhibitors Passed - 01/01/2022  6:21 AM      Passed - HGB in normal range and within 360 days    Hemoglobin  Date Value Ref Range Status  07/29/2021 16.2 13.0 - 17.0 g/dL Final         Passed - Cr in normal range and within 360 days    Creat  Date Value Ref Range Status  07/01/2021 1.12 0.70 - 1.35 mg/dL Final   Creatinine, Ser  Date Value Ref Range Status  07/29/2021 1.20 0.61 - 1.24 mg/dL Final         Passed - Patient is not pregnant      Passed - Valid encounter within last 12 months     Recent Outpatient Visits          2 weeks ago Chronic pain of both knees   [facility] [place], [name], DO   5 months ago Intestinal infection due to enteropathogenic E. coli   [facility] [place], Coralie Keens, NP   5 months ago Annual physical exam   [facility] [name], DO   8 months ago [place], DO   10 months ago Type 2 diabetes mellitus with other specified complication, with long-term current use of insulin [facility])   [facility] [name], [name], DO      Future Appointments            In 2 months Ralene Bathe, MD [place]   In 6 months  [facility], [facility]

## 2022-01-02 DIAGNOSIS — R7309 Other abnormal glucose: Secondary | ICD-10-CM | POA: Diagnosis not present

## 2022-01-02 DIAGNOSIS — M19012 Primary osteoarthritis, left shoulder: Secondary | ICD-10-CM | POA: Diagnosis not present

## 2022-01-02 DIAGNOSIS — M25512 Pain in left shoulder: Secondary | ICD-10-CM | POA: Diagnosis not present

## 2022-01-02 DIAGNOSIS — M170 Bilateral primary osteoarthritis of knee: Secondary | ICD-10-CM | POA: Diagnosis not present

## 2022-01-05 ENCOUNTER — Ambulatory Visit: Payer: Medicare Other | Admitting: Pharmacist

## 2022-01-05 DIAGNOSIS — M9931 Osseous stenosis of neural canal of cervical region: Secondary | ICD-10-CM | POA: Diagnosis not present

## 2022-01-05 DIAGNOSIS — Z794 Long term (current) use of insulin: Secondary | ICD-10-CM | POA: Diagnosis not present

## 2022-01-05 DIAGNOSIS — E1169 Type 2 diabetes mellitus with other specified complication: Secondary | ICD-10-CM | POA: Diagnosis not present

## 2022-01-05 NOTE — Patient Instructions (Signed)
Visit Information  Thank you for taking time to visit with me today. Please don't hesitate to contact me if I can be of assistance to you before our next scheduled telephone appointment.  Following are the goals we discussed today:   Goals Addressed             This Visit's Progress    Pharmacy Goals       Our goal A1c is less than 7%. This corresponds with fasting sugars less than 130 and 2 hour after meal sugars less than 180. Please check your blood sugar and keep a log of the results  Please check your home blood pressure, keep a log of the results and bring this with you to your medical appointments.  Our goal bad cholesterol, or LDL, is less than 70 . This is why it is important to continue taking your atorvastatin.  Wallace Cullens, PharmD, [place] [phone]         Our next appointment is by telephone on 10/14/2022 at 10 am  Please call the care guide team at [phone] if you need to cancel or reschedule your appointment.    The patient verbalized understanding of instructions, educational materials, and care plan provided today and declined offer to receive copy of patient instructions, educational materials, and care plan.

## 2022-01-05 NOTE — Chronic Care Management (AMB) (Signed)
Chronic Care Management CCM Pharmacy Note  01/05/2022 Name:  Eric Andrews MRN:  [id number] DOB:  June 08, 1951   Subjective: Eric Andrews is an 71 y.o. year old male who is a primary patient of [name], DO.  The CCM team was consulted for assistance with disease management and care coordination needs.    Engaged with patient by telephone for follow up visit for pharmacy case management and/or care coordination services.   Objective:  Medications     Reviewed by Ralene Bathe, MD (Physician) on 12/30/21 at 1214  Med List Status: <None>   Medication Order Taking? Sig Documenting Provider Last Dose Status Informant  aspirin 81 MG EC tablet [id number] No Take 81 mg by mouth daily. [provider] Taking Active   atorvastatin (LIPITOR) 20 MG tablet [id number] No TAKE 1 TABLET BY MOUTH AT BEDTIME Karamalegos, [name], DO Taking Active   Bacillus Coagulans-Inulin (PROBIOTIC) 1-250 BILLION-MG CAPS [id number] No Take 1 capsule by mouth daily.  Patient not taking: Reported on 08/01/2021   Ward, Delice Bison, DO Not Taking Active   busPIRone (BUSPAR) 10 MG tablet [id number] No Take 1 tablet (10 mg total) by mouth 2 (two) times daily. [name], DO Taking Active   celecoxib (CELEBREX) 200 MG capsule [id number] No Take 1 capsule (200 mg total) by mouth 2 (two) times daily. [name], DO Taking Active   cetirizine (ZYRTEC) 10 MG tablet [id number] No Take 1 tablet (10 mg total) by mouth at bedtime. [name], DO Taking Active   chlorthalidone (HYGROTON) 25 MG tablet [id number] No TAKE 1 TABLET(25 MG) BY MOUTH DAILY Karamalegos, [name], DO Taking Active   cholecalciferol (VITAMIN D3) 25 MCG (1000 UNIT) tablet [id number] No Take 1,000 Units by mouth daily. [provider] Taking Active   FARXIGA 10 MG TABS tablet [id number] No Take 10 mg by mouth daily before breakfast. [name], DO Taking Active   fenofibrate  micronized (LOFIBRA) 134 MG capsule [id number] No Take 1 capsule (134 mg total) by mouth daily before breakfast. [name], DO Taking Active   finasteride (PROSCAR) 5 MG tablet [id number] No TAKE 1 TABLET(5 MG) BY MOUTH EVERY EVENING Karamalegos, [name], DO Taking Active   Fluocinolone Acetonide (DERMOTIC) 0.01 % OIL [id number] Yes Place 1 drop in ear(s) once a week. Apply 1 -2 gtts to ears on Sundays Ralene Bathe, MD  Active   gabapentin (NEURONTIN) 600 MG tablet [id number] No Take by mouth. Take 1 tablet (600 mg total) by mouth 4 (four) times daily [provider] Taking Active   hydrocortisone 2.5 % cream [id number] No Apply to aa's of face and ears QD on Monday, Wednesday, and Friday. Ralene Bathe, MD Taking Active   insulin degludec [facility]) 100 UNIT/ML FlexTouch Pen [id number] No Inject 68 Units into the skin at bedtime. [provider] Taking Active   Insulin Pen Needle 31G X 5 MM MISC [id number] No Use with insulin to inject into skin 3 times daily as directed [name], DO Taking Active   ketoconazole (NIZORAL) 2 % cream [id number] No Apply to aa's face and ears QD on Tuesday, Thursday, and Saturday. Ralene Bathe, MD Taking Active   losartan (COZAAR) 50 MG tablet [id number] No Take 1 tablet (50 mg total) by mouth daily. [name], DO Taking Active   metFORMIN (GLUCOPHAGE-XR) 500 MG 24 hr tablet [id number] No TAKE 2 TABLETS(1000 MG) BY MOUTH TWICE DAILY WITH A MEAL  [name], DO Taking Active   Multiple Vitamin (MULTIVITAMIN) capsule [id number] No Take 1 capsule by mouth daily. [provider] Taking Active   NOVOLOG FLEXPEN 100 UNIT/ML FlexPen [id number] No Inject 14 Units into the skin 3 (three) times daily with meals. Sliding scale [name], DO Taking Active   Omega-3 Fatty Acids (FISH OIL) 1000 MG CAPS [id number] No Take by mouth in the morning and at bedtime. [provider] Taking Active   omeprazole (PRILOSEC) 20 MG capsule [id number] No Take 1 capsule (20 mg total) by mouth daily before breakfast. [name], DO Taking Active   pioglitazone (ACTOS) 15 MG tablet [id number] No TAKE 1 TABLET(15 MG) BY MOUTH DAILY Karamalegos, [name], DO Taking Active   Semaglutide, 1 MG/DOSE, (OZEMPIC, 1 MG/DOSE,) 4 MG/3ML SOPN [id number]  Inject 1 mg into the skin once a week for 28 days. [name], DO  Active   tamsulosin (FLOMAX) 0.4 MG CAPS capsule [id number] No TAKE ONE CAPSULE BY MOUTH EVERY NIGHT AT BEDTIME FOR URINE RETENTION [name], DO Taking Active   traZODone (DESYREL) 100 MG tablet [id number]  Take 1 tablet (100 mg total) by mouth at bedtime. [name], DO  Active   venlafaxine XR (EFFEXOR-XR) 150 MG 24 hr capsule [id number]  Take 1 capsule (150 mg total) by mouth daily with breakfast. [name], DO  Active             Pertinent Labs:  Lab Results  Component Value Date   HGBA1C 8.5 (H) 07/01/2021   Lab Results  Component Value Date   CHOL 122 07/01/2021   HDL 33 (L) 07/01/2021   LDLCALC 62 07/01/2021   TRIG 209 (H) 07/01/2021   CHOLHDL 3.7 07/01/2021   Lab Results  Component Value Date   CREATININE 1.20 07/29/2021   BUN 21 07/29/2021   NA 133 (L) 07/29/2021   K 3.5 07/29/2021   CL 92 (L) 07/29/2021   CO2 28 07/29/2021   BP Readings from Last 3 Encounters:  12/17/21 134/76  07/30/21 139/70  07/29/21 124/74   Pulse Readings from Last 3 Encounters:  12/17/21 83  07/30/21 77  07/29/21 94     SDOH:  (Social Determinants of Health) assessments and interventions performed:    CCM Care Plan  Review of patient past medical history, allergies, medications, health status, including review of consultants reports, laboratory and other test data, was performed as part of comprehensive evaluation and provision of chronic care management services.   Care Plan :  PharmD - Disease Management T2DM, osteoarthritis  Updates made by Rennis Petty, RPH-CPP since 01/05/2022 12:00 AM     Problem: Disease Progression      Long-Range Goal: Disease Progression Prevented or Minimized   Start Date: 11/06/2020  Expected End Date: 01/05/2021  Recent Progress: On track  Priority: High  Note:   Current Barriers:  Unable to independently afford treatment regimen, particularly when enters coverage gap of [employer] Part D plan Patient APPROVED for patient assistance from [employer] for [place], [place] through 12/06/2022 Patient APPROVED for patient assistance from [employer] for [place] through 12/06/2022  Pharmacist Clinical Goal(s):  Over the next 60 days, patient will verbalize ability to afford treatment regimen through collaboration with PharmD and provider.   Interventions: 1:1 collaboration with [name], DO regarding development and update of comprehensive plan of care as evidenced by provider attestation and co-signature Inter-disciplinary care team collaboration (see  longitudinal plan of care) Perform chart review.  Patient seen for Office Visit with [facility] on 12/22/2021. Provider advised patient: Increase gabapentin to 600 mg in the morning, 600 mg in the afternoon, 600 mg at dinnertime, 1000 mg at bedtime and 600 mg when you wake up at midnight.  Continue Trazodone 100 mg Referral placed to Dr. Alba Destine in physiatry Office Visit with [place] on 1/18 Office Visit with Physical Medicine and Rehabilitation on 1/24 Office Visit with [place] on 1/27 Note patient received left shoulder cortisone injection Today patient reports taking gabapentin 600 mg in the morning, 600 mg in the afternoon, 1000 mg at dinner time/bedtime and 600 mg when he wakes up at midnight Denies dizziness/sedation with dose increase Reports he is planning to have both knee and shoulder  surgery in the future  Medication Assistance: Collaborating with [place] for aid to patient with reapplying for patient assistance from [employer] and [employer] for 2023 calendar year Note previously contacted [employer] and confirmed patient APPROVED for re-enrollment in [employer] for [employer], [place] for 2023 calendar year Today reports that he has not yet received refills from patient assistance program or contacted program. However, reports picked up supply of Ozempic from local pharmacy to continue Ozempic 1 mg weekly. States copayment for Ozempic affordable for now and will contact pharmacy/office if needing additional refill before receives supply from assistance program. From review of chart, Note [facility] contacted [employer] on 1/20 and was advised patient APPROVED 12/07/21-12/06/22 for re-enrollment in their program for [place] Provide patient with this update today  Type 2 Diabetes  Uncontrolled; current treatment Tresiba 68 units daily in evening  Previous direction from PCP regarding fasting sugar reading in AM: if consistently < 150 reduce by about 2 units per 3 days or more Novolog - has reported taking three times daily with meals following sliding scale from previous Endocrinologist in [place] Reported sliding scale based on blood sugar prior to each meal: If less than 140: takes 13 units If 140-200: takes 14 units If 200-250: takes 15 units If 250 or above: takes 16 units Metformin ER 500 mg - 2 tablets (1000 mg) twice daily Pioglitazone 15 mg once daily Farxiga 10 mg daily  Ozempic 1 mg weekly Lipid management: atorvastatin 20 mg daily Reports recent home morning fasting blood sugars elevated on Friday and Saturday, following cortisone injection, but now have come back down to his normal range Reports fasting blood sugar this morning: 130 Denies signs/symptoms of hypoglycemia Have discussed role of diet and exercise on blood sugar control Has  previously observed his higher blood sugar readings tied to larger portion sizes when stressed Discuss importance of limiting carbohydrate portion sizes Exercise: currently limited by pain in his knees Have counseled on s/s of hypoglycemia and how to manage low blood sugars Have counseled patient on importance of guidance from provider for adjustment of medications; patient has expressed preference for more self-management of his blood sugar control.   Patient declines further medication questions/concerns today or need for further follow up with CM Pharmacist at this time. Prefers to have next follow up at end of calendar year for patient assistance program re-enrollment  Patient Goals/Self-Care Activities Over the next 60 days, patient will:  - collaborate with provider on medication access solutions - patient to call office for new or worsening medical concerns - attend medical appointments as scheduled  Follow Up Plan: Telephone follow up appointment with care  management team member scheduled for: 10/14/2022 at 10 am      Wallace Cullens, PharmD, [place], [facility] [place] [phone]

## 2022-01-06 DIAGNOSIS — Z794 Long term (current) use of insulin: Secondary | ICD-10-CM | POA: Diagnosis not present

## 2022-01-06 DIAGNOSIS — I10 Essential (primary) hypertension: Secondary | ICD-10-CM

## 2022-01-06 DIAGNOSIS — E1169 Type 2 diabetes mellitus with other specified complication: Secondary | ICD-10-CM

## 2022-01-20 DIAGNOSIS — M542 Cervicalgia: Secondary | ICD-10-CM | POA: Diagnosis not present

## 2022-01-20 DIAGNOSIS — M48062 Spinal stenosis, lumbar region with neurogenic claudication: Secondary | ICD-10-CM | POA: Diagnosis not present

## 2022-01-20 DIAGNOSIS — G8929 Other chronic pain: Secondary | ICD-10-CM | POA: Diagnosis not present

## 2022-01-20 DIAGNOSIS — M5412 Radiculopathy, cervical region: Secondary | ICD-10-CM | POA: Diagnosis not present

## 2022-01-20 DIAGNOSIS — M9931 Osseous stenosis of neural canal of cervical region: Secondary | ICD-10-CM | POA: Diagnosis not present

## 2022-01-20 DIAGNOSIS — M5442 Lumbago with sciatica, left side: Secondary | ICD-10-CM | POA: Diagnosis not present

## 2022-01-20 DIAGNOSIS — M5441 Lumbago with sciatica, right side: Secondary | ICD-10-CM | POA: Diagnosis not present

## 2022-01-22 DIAGNOSIS — M4802 Spinal stenosis, cervical region: Secondary | ICD-10-CM | POA: Diagnosis not present

## 2022-01-22 DIAGNOSIS — M6281 Muscle weakness (generalized): Secondary | ICD-10-CM | POA: Diagnosis not present

## 2022-01-22 DIAGNOSIS — M48062 Spinal stenosis, lumbar region with neurogenic claudication: Secondary | ICD-10-CM | POA: Diagnosis not present

## 2022-01-22 DIAGNOSIS — M2560 Stiffness of unspecified joint, not elsewhere classified: Secondary | ICD-10-CM | POA: Diagnosis not present

## 2022-01-29 ENCOUNTER — Other Ambulatory Visit: Payer: Self-pay | Admitting: Family Medicine

## 2022-01-29 DIAGNOSIS — E1169 Type 2 diabetes mellitus with other specified complication: Secondary | ICD-10-CM

## 2022-01-29 DIAGNOSIS — Z794 Long term (current) use of insulin: Secondary | ICD-10-CM

## 2022-01-29 MED ORDER — OZEMPIC (1 MG/DOSE) 4 MG/3ML ~~LOC~~ SOPN: 1.0000 mg | PEN_INJECTOR | SUBCUTANEOUS | 3 refills | Status: DC

## 2022-01-29 MED ORDER — TRESIBA FLEXTOUCH 100 UNIT/ML ~~LOC~~ SOPN: 68.0000 [IU] | PEN_INJECTOR | Freq: Every day | SUBCUTANEOUS | 5 refills | Status: AC

## 2022-01-29 NOTE — Telephone Encounter (Signed)
Copied from [place] [id number]. Topic: Quick Communication - Rx Refill/Question >> Jan 29, 2022  2:12 PM Leward Quan A wrote: Medication: insulin degludec (TRESIBA FLEXTOUCH) 100 UNIT/ML FlexTouch Pen, Semaglutide, 1 MG/DOSE, (OZEMPIC, 1 MG/DOSE,) 4 MG/3ML SOPN  Has the patient contacted their pharmacy? Yes.  Need new Rx  (Agent: If no, request that the patient contact the pharmacy for the refill. If patient does not wish to contact the pharmacy document the reason why and proceed with request.) (Agent: If yes, when and what did the pharmacy advise?)  Preferred Pharmacy (with phone number or street name): [place] [place], [place] [address]  Phone:  [phone] Fax:  [phone]    Has the patient been seen for an appointment in the last year OR does the patient have an upcoming appointment? Yes.    Agent: Please be advised that RX refills may take up to 3 business days. We ask that you follow-up with your pharmacy.

## 2022-01-29 NOTE — Telephone Encounter (Signed)
Requested medication (s) are due for refill today:   Not sure  Tyler Aas is from a historical provider and the Ozempic is not on his med list  Requested medication (s) are on the active medication list:   No for the Ozempic.   Tyler Aas is a historical drug  Future visit scheduled:   No   Last ordered: Not refill/fill dates for either medication  Returned for provider to review.     Requested Prescriptions  Pending Prescriptions Disp Refills   insulin degludec (TRESIBA FLEXTOUCH) 100 UNIT/ML FlexTouch Pen      Sig: Inject 68 Units into the skin at bedtime.     Endocrinology:  Diabetes - Insulins Failed - 01/29/2022  2:24 PM      Failed - HBA1C is between 0 and 7.9 and within 180 days    Hgb A1c MFr Bld  Date Value Ref Range Status  07/01/2021 8.5 (H) <5.7 % of total Hgb Final    Comment:    For someone without known diabetes, a hemoglobin A1c value of 6.5% or greater indicates that they may have  diabetes and this should be confirmed with a follow-up  test. . For someone with known diabetes, a value <7% indicates  that their diabetes is well controlled and a value  greater than or equal to 7% indicates suboptimal  control. A1c targets should be individualized based on  duration of diabetes, age, comorbid conditions, and  other considerations. . Currently, no consensus exists regarding use of hemoglobin A1c for diagnosis of diabetes for children. Renella Cunas - Valid encounter within last 6 months    Recent Outpatient Visits           1 month ago Chronic pain of both knees   [facility], [name], DO   6 months ago Intestinal infection due to enteropathogenic E. coli   [facility], Coralie Keens, NP   6 months ago Annual physical exam   [facility] [name], DO   9 months ago [facility] [name], DO   11 months ago Type 2 diabetes  mellitus with other specified complication, with long-term current use of insulin [facility])   [facility] [name], DO       Future Appointments             In 1 month Ralene Bathe, MD [place]   In 5 months  [facility]             Semaglutide, 1 MG/DOSE, (OZEMPIC, 1 MG/DOSE,) 4 MG/3ML SOPN 3 mL 0    Sig: Inject 1 mg into the skin once a week for 28 days.     Endocrinology:  Diabetes - GLP-1 Receptor Agonists - semaglutide Failed - 01/29/2022  2:24 PM      Failed - HBA1C in normal range and within 180 days    Hgb A1c MFr Bld  Date Value Ref Range Status  07/01/2021 8.5 (H) <5.7 % of total Hgb Final    Comment:    For someone without known diabetes, a hemoglobin A1c value of 6.5% or greater indicates that they may have  diabetes and this should be confirmed with a follow-up  test. . For someone with known diabetes, a value <7% indicates  that their diabetes is well controlled and a  value  greater than or equal to 7% indicates suboptimal  control. A1c targets should be individualized based on  duration of diabetes, age, comorbid conditions, and  other considerations. . Currently, no consensus exists regarding use of hemoglobin A1c for diagnosis of diabetes for children. .           Passed - Cr in normal range and within 360 days    Creat  Date Value Ref Range Status  07/01/2021 1.12 0.70 - 1.35 mg/dL Final   Creatinine, Ser  Date Value Ref Range Status  07/29/2021 1.20 0.61 - 1.24 mg/dL Final          Passed - Valid encounter within last 6 months    Recent Outpatient Visits           1 month ago Chronic pain of both knees   [place], DO   6 months ago Intestinal infection due to enteropathogenic E. coli   [facility] [place], Coralie Keens, NP   6 months ago Annual physical exam   [facility] [name], DO    9 months ago [place], DO   11 months ago Type 2 diabetes mellitus with other specified complication, with long-term current use of insulin [facility])   [facility] [name], DO       Future Appointments             In 1 month Nehemiah Massed Monia Sabal, MD [place]   In 5 months  [facility], [facility]

## 2022-01-31 ENCOUNTER — Other Ambulatory Visit: Payer: Self-pay | Admitting: Family Medicine

## 2022-01-31 DIAGNOSIS — Z794 Long term (current) use of insulin: Secondary | ICD-10-CM

## 2022-01-31 DIAGNOSIS — I10 Essential (primary) hypertension: Secondary | ICD-10-CM

## 2022-01-31 DIAGNOSIS — E1169 Type 2 diabetes mellitus with other specified complication: Secondary | ICD-10-CM

## 2022-02-01 IMAGING — MR MR CERVICAL SPINE W/O CM
5 series · 39 of 48 positions shown · non-contrast
Comparison: None.

CLINICAL DATA: Neck pain, left arm numbness.

EXAM:
MRI CERVICAL SPINE WITHOUT CONTRAST
TECHNIQUE: Multiplanar, multisequence MR imaging of the cervical spine was
performed. No intravenous contrast was administered.

[Series 5: T2 · sagittal · 3.0mm · 0.62mm/px · 6 of 15 slices shown (1 of 2)]
[im 1/15]
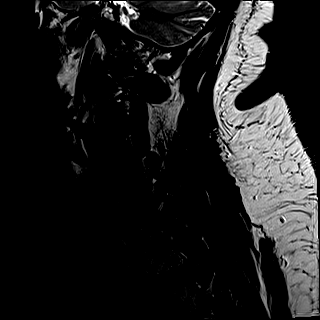
[im 3/15]
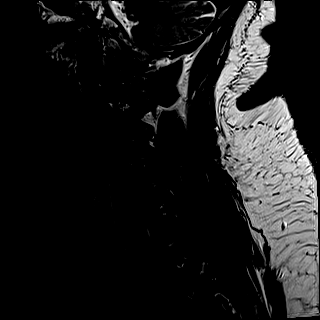
[im 6/15]
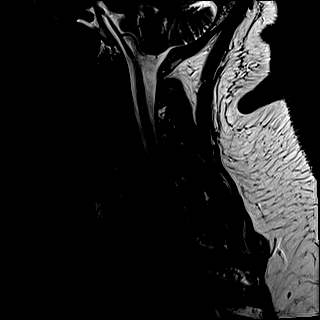
[im 9/15]
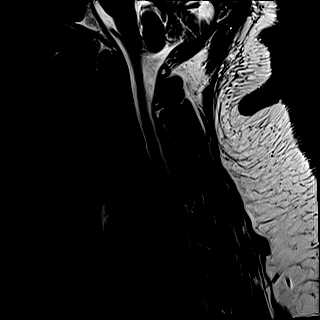
[im 12/15]
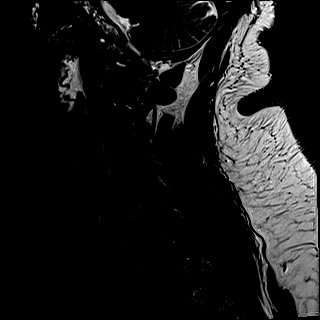
[im 15/15]
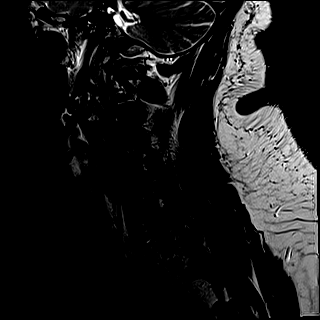

[Series 6: FLAIR · sagittal · 3.0mm · 0.78mm/px · 7 of 15 slices shown]
[im 1/15]
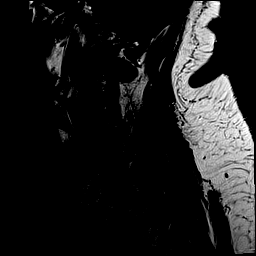
[im 3/15]
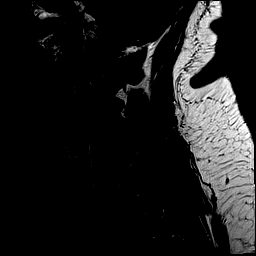
[im 5/15]
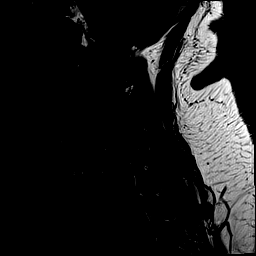
[im 8/15]
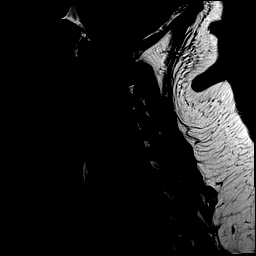
[im 10/15]
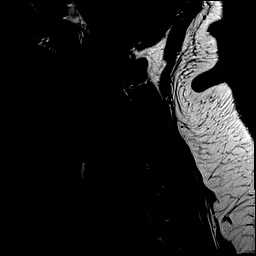
[im 12/15]
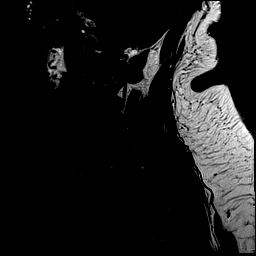
[im 15/15]
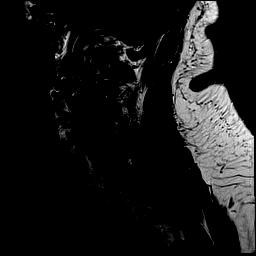

[Series 7: STIR · sagittal · 3.0mm · 0.62mm/px · 7 of 15 slices shown]
[im 1/15]
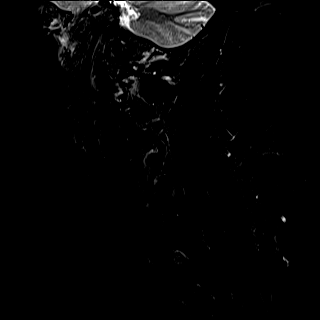
[im 3/15]
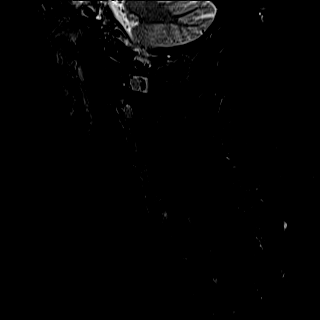
[im 5/15]
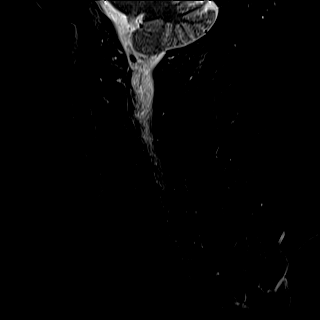
[im 8/15]
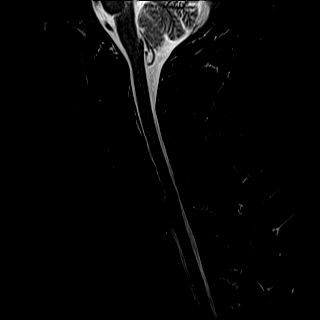
[im 10/15]
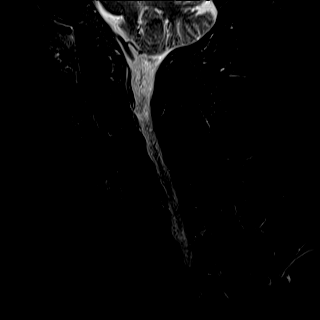
[im 12/15]
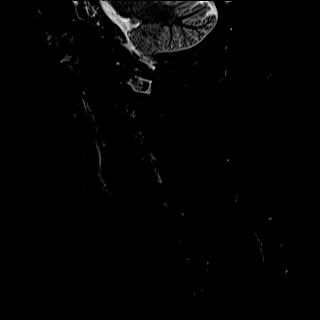
[im 15/15]
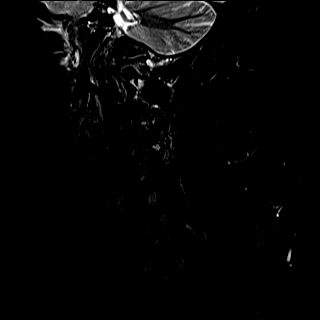

[Series 8: T2 · axial · 3.0mm · 0.70mm/px · z∈[-150,-54]mm · 11 of 29 slices shown (2 of 2)]
[im 1/29]
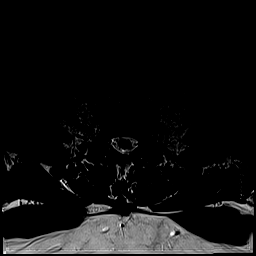
[im 3/29]
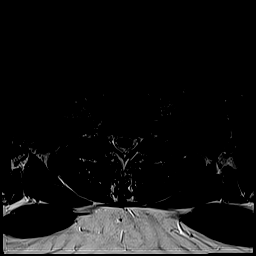
[im 5/29]
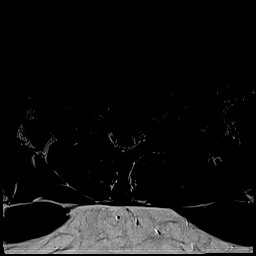
[im 7/29]
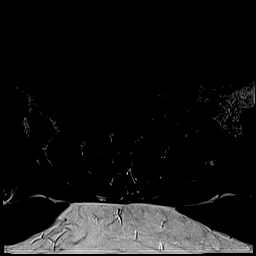
[im 9/29]
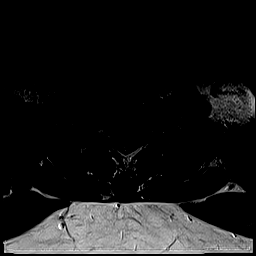
[im 11/29]
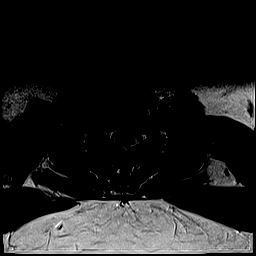
[im 13/29]
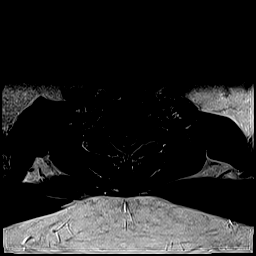
[im 16/29]
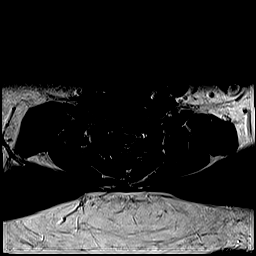
[im 20/29]
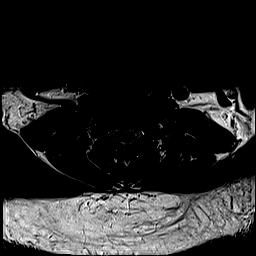
[im 24/29]
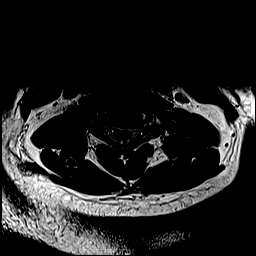
[im 29/29]
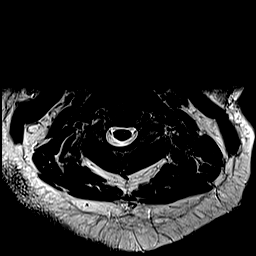

[Series 9: ax mpgr · axial · 3.0mm · 0.35mm/px · z∈[-150,-54]mm · 8 of 29 slices shown]
[im 1/29]
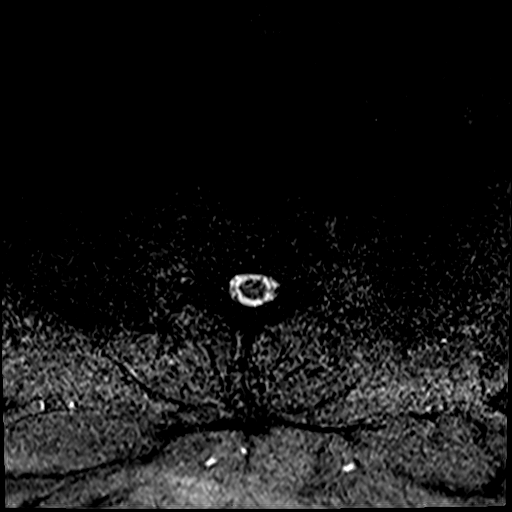
[im 5/29]
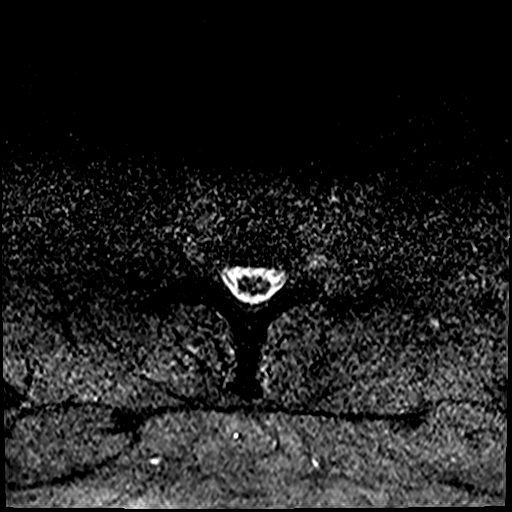
[im 9/29]
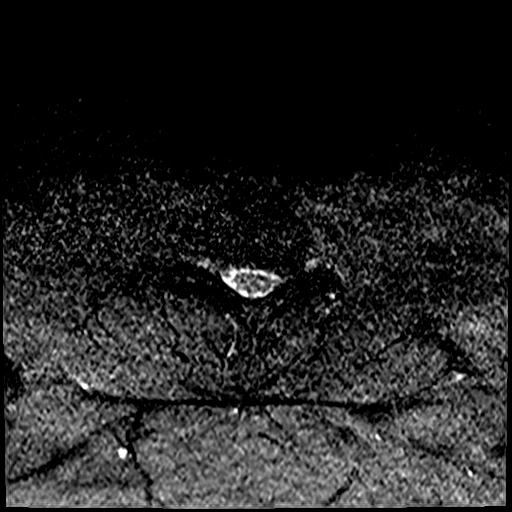
[im 13/29]
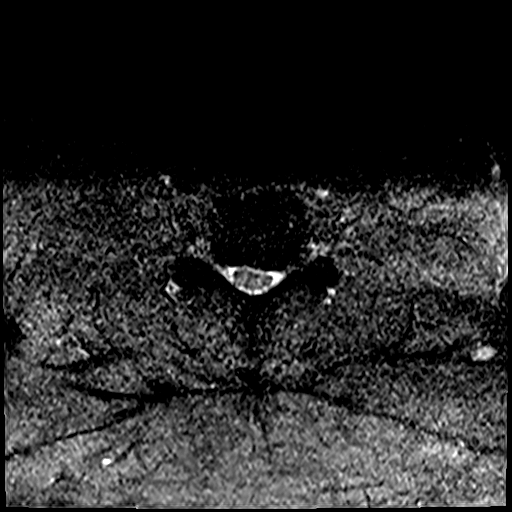
[im 16/29]
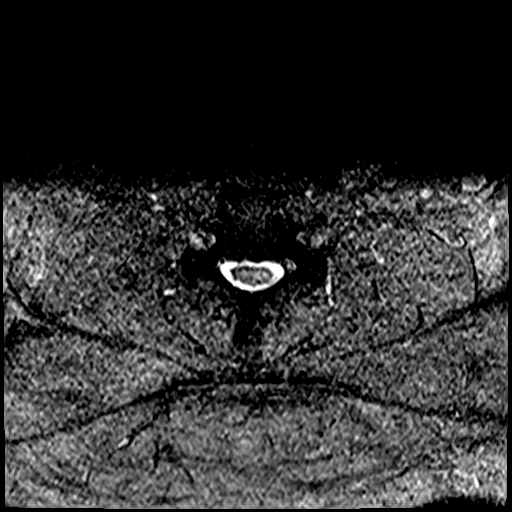
[im 20/29]
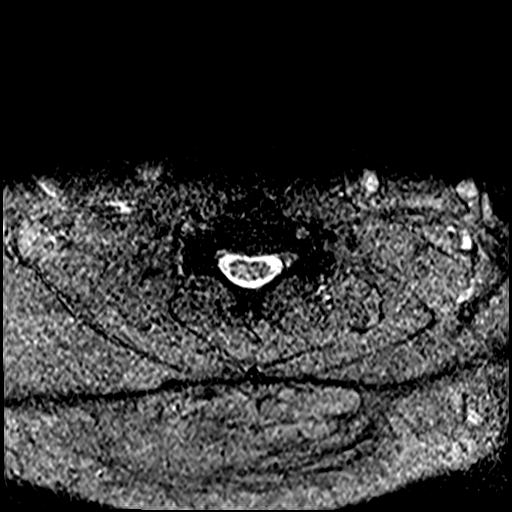
[im 24/29]
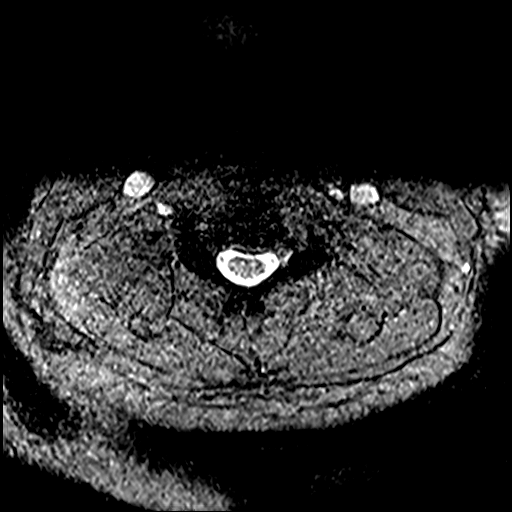
[im 29/29]
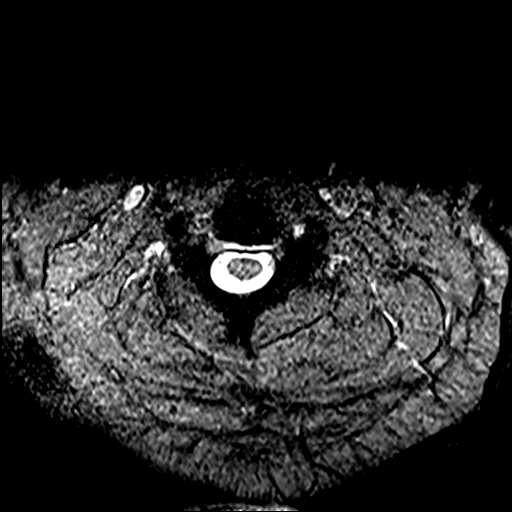

[39 of 48 positions shown; findings below may reference images not displayed]

FINDINGS: Alignment: Straightening of lordosis.

Vertebrae: Mild Modic type 2 endplate degenerative changes.
Vertebral body heights are preserved. No focal osseous lesion.

Cord: Normal signal and morphology.

Posterior Fossa, vertebral arteries: Negative.

Disc levels: Multilevel desiccation.

C2-3: Uncovertebral and facet degenerative spurring. Patent spinal
canal and neural foramen.

C3-4: Disc osteophyte complex with left predominant uncovertebral
and facet hypertrophy. Shallow central protrusion. Minimal spinal
canal, mild right and moderate left neural foraminal narrowing.

C4-5: Disc osteophyte complex with superimposed right subarticular
protrusion. Uncovertebral and facet hypertrophy. Mild spinal canal
and bilateral neural foraminal narrowing.

C5-6: Disc osteophyte complex with superimposed right foraminal and
left subarticular protrusions. Uncovertebral and facet degenerative
spurring. Mild spinal canal and bilateral neural foraminal
narrowing.

C6-7: Disc osteophyte complex with superimposed left foraminal
protrusion. Uncovertebral and facet degenerative spurring. Mild
spinal canal, mild right and severe left neural foraminal narrowing.

C7-T1: No significant disc bulge. Uncovertebral and facet
degenerative spurring. Patent spinal canal and neural foramen.

Paraspinal tissues: Negative.
IMPRESSION: Multilevel spondylosis. Moderate to severe left C3-4, left C6-7
neural foraminal narrowing.

Mild spinal canal narrowing at the C4-C7 levels.

Otherwise mild neural foraminal narrowing at the C3-C7 levels.

## 2022-02-02 NOTE — Telephone Encounter (Signed)
Requested Prescriptions  Pending Prescriptions Disp Refills   losartan (COZAAR) 50 MG tablet [Pharmacy Med Name: LOSARTAN 50MG  TABLETS] 90 tablet 0    Sig: TAKE 1 TABLET(50 MG) BY MOUTH DAILY     Cardiovascular:  Angiotensin Receptor Blockers Failed - 01/31/2022  6:21 AM      Failed - Cr in normal range and within 180 days    Creat  Date Value Ref Range Status  07/01/2021 1.12 0.70 - 1.35 mg/dL Final   Creatinine, Ser  Date Value Ref Range Status  07/29/2021 1.20 0.61 - 1.24 mg/dL Final         Failed - K in normal range and within 180 days    Potassium  Date Value Ref Range Status  07/29/2021 3.5 3.5 - 5.1 mmol/L Final         Passed - Patient is not pregnant      Passed - Last BP in normal range    BP Readings from Last 1 Encounters:  12/17/21 134/76         Passed - Valid encounter within last 6 months    Recent Outpatient Visits          1 month ago Chronic pain of both knees   [place], DO   6 months ago Intestinal infection due to enteropathogenic E. coli   [facility], Coralie Keens, NP   6 months ago Annual physical exam   [facility] [name], DO   9 months ago [facility] [name], DO   11 months ago Type 2 diabetes mellitus with other specified complication, with long-term current use of insulin ([place])   [facility] [name], [name], DO      Future Appointments            In 1 month Ralene Bathe, MD [place]   In 5 months  [facility], [facility]            pioglitazone (ACTOS) 15 MG tablet [Pharmacy Med Name: PIOGLITAZONE 15MG  TABLETS] 90 tablet 0    Sig: TAKE 1 TABLET(15 MG) BY MOUTH DAILY     Endocrinology:  Diabetes - Glitazones - pioglitazone Failed - 01/31/2022  6:21 AM      Failed - HBA1C is between 0 and 7.9 and within 180 days    Hgb A1c MFr Bld   Date Value Ref Range Status  07/01/2021 8.5 (H) <5.7 % of total Hgb Final    Comment:    For someone without known diabetes, a hemoglobin A1c value of 6.5% or greater indicates that they may have  diabetes and this should be confirmed with a follow-up  test. . For someone with known diabetes, a value <7% indicates  that their diabetes is well controlled and a value  greater than or equal to 7% indicates suboptimal  control. A1c targets should be individualized based on  duration of diabetes, age, comorbid conditions, and  other considerations. . Currently, no consensus exists regarding use of hemoglobin A1c for diagnosis of diabetes for children. Renella Cunas - Valid encounter within last 6 months    Recent Outpatient Visits          1 month ago Chronic pain of both knees   [place], DO   6 months ago  Intestinal infection due to enteropathogenic E. coli   [facility] [place], Coralie Keens, NP   6 months ago Annual physical exam   [facility] [name], DO   9 months ago [facility] [name], DO   11 months ago Type 2 diabetes mellitus with other specified complication, with long-term current use of insulin ([place])   [place], DO      Future Appointments            In 1 month Ralene Bathe, MD [place]   In 5 months  [facility], [facility]            chlorthalidone (HYGROTON) 25 MG tablet [Pharmacy Med Name: CHLORTHALIDONE 25MG TABLETS] 90 tablet 0    Sig: TAKE 1 TABLET(25 MG) BY MOUTH DAILY     Cardiovascular: Diuretics - Thiazide Failed - 01/31/2022  6:21 AM      Failed - Cr in normal range and within 180 days    Creat  Date Value Ref Range Status  07/01/2021 1.12 0.70 - 1.35 mg/dL Final   Creatinine, Ser  Date Value Ref Range Status  07/29/2021 1.20 0.61  - 1.24 mg/dL Final         Failed - K in normal range and within 180 days    Potassium  Date Value Ref Range Status  07/29/2021 3.5 3.5 - 5.1 mmol/L Final         Failed - Na in normal range and within 180 days    Sodium  Date Value Ref Range Status  07/29/2021 133 (L) 135 - 145 mmol/L Final  12/19/2019 140 137 - 147 Final         Passed - Last BP in normal range    BP Readings from Last 1 Encounters:  12/17/21 134/76         Passed - Valid encounter within last 6 months    Recent Outpatient Visits          1 month ago Chronic pain of both knees   [place], [name], DO   6 months ago Intestinal infection due to enteropathogenic E. coli   [place], Coralie Keens, NP   6 months ago Annual physical exam   [facility] [name], DO   9 months ago [facility], [name], DO   11 months ago Type 2 diabetes mellitus with other specified complication, with long-term current use of insulin ([place])   [facility] [name], [name], DO      Future Appointments            In 1 month Ralene Bathe, MD [place]   In 5 months  [facility], [facility]            metFORMIN (GLUCOPHAGE-XR) 500 MG 24 hr tablet [Pharmacy Med Name: METFORMIN ER 500MG 24HR TABS] 360 tablet 0    Sig: TAKE 2 TABLETS(1000 MG) BY MOUTH TWICE DAILY WITH A MEAL     Endocrinology:  Diabetes - Biguanides Failed - 01/31/2022  6:21 AM      Failed - HBA1C is between 0 and 7.9 and within 180 days    Hgb A1c MFr Bld  Date Value Ref Range Status  07/01/2021 8.5 (H) <5.7 % of total  Hgb Final    Comment:    For someone without known diabetes, a hemoglobin A1c value of 6.5% or greater indicates that they may have  diabetes and this should be confirmed with a follow-up  test. . For someone with known diabetes, a value <7% indicates  that  their diabetes is well controlled and a value  greater than or equal to 7% indicates suboptimal  control. A1c targets should be individualized based on  duration of diabetes, age, comorbid conditions, and  other considerations. . Currently, no consensus exists regarding use of hemoglobin A1c for diagnosis of diabetes for children. .          Failed - B12 Level in normal range and within 720 days    No results found for: VITAMINB12       Passed - Cr in normal range and within 360 days    Creat  Date Value Ref Range Status  07/01/2021 1.12 0.70 - 1.35 mg/dL Final   Creatinine, Ser  Date Value Ref Range Status  07/29/2021 1.20 0.61 - 1.24 mg/dL Final         Passed - eGFR in normal range and within 360 days    GFR, Estimated  Date Value Ref Range Status  07/29/2021 >60 >60 mL/min Final    Comment:    (NOTE) Calculated using the CKD-EPI Creatinine Equation (2021)    eGFR  Date Value Ref Range Status  07/01/2021 71 > OR = 60 mL/min/1.55m Final    Comment:    The eGFR is based on the CKD-EPI 2021 equation. To calculate  the new eGFR from a previous Creatinine or Cystatin C result, go to https://www.kidney.org/professionals/ kdoqi/gfr%5Fcalculator          Passed - Valid encounter within last 6 months    Recent Outpatient Visits          1 month ago [place] pain of both knees   [place]   6 months ago Intestinal infection due to enteropathogenic E. coli   [facility]   6 months ago [facility]   [facility]   11 months ago Type 2 diabetes mellitus with other specified complication, with long-term current use of insulin ([place]   [facility] Dought[facility]      Future Appointments            In 1 month [place]ene Bathe [place]  In 5 months  [facility] [place] normal limits and completed in the last 12 months    WBC  Date Value Ref Range Status  07/29/2021 8.6 4.0 - 10.5 K/uL Final   RBC  Date Value Ref Range Status  07/29/2021 5.62 4.22 - 5.81 MIL/uL Final   Hemoglobin  Date Value Ref Range Status  07/29/2021 16.2 13.0 - 17.0 g/dL Final   HCT  Date Value Ref Range Status  07/29/2021 46.8 39.0 - 52.0 % Final   MCHC  Date Value Ref Range Status  07/29/2021 34.6 30.0 - 36.0 g/dL Final   [facility] Date Value Ref Range Status  07/29/2021 28.8 26.0 - 34.0 pg Final   MCV  Date Value Ref Range Status  07/29/2021 83.3 80.0 - 100.0 fL Final   No results found for: PLTCOUNTKUC, LABPLAT, POCPLA RDW  Date Value Ref Range Status  07/29/2021 13.6 11.5 - 15.5 % Final

## 2022-02-04 DIAGNOSIS — M4802 Spinal stenosis, cervical region: Secondary | ICD-10-CM | POA: Diagnosis not present

## 2022-02-11 ENCOUNTER — Ambulatory Visit (INDEPENDENT_AMBULATORY_CARE_PROVIDER_SITE_OTHER): Payer: Medicare Other | Admitting: Pharmacist

## 2022-02-11 DIAGNOSIS — M4802 Spinal stenosis, cervical region: Secondary | ICD-10-CM | POA: Diagnosis not present

## 2022-02-11 DIAGNOSIS — Z794 Long term (current) use of insulin: Secondary | ICD-10-CM

## 2022-02-11 DIAGNOSIS — E1169 Type 2 diabetes mellitus with other specified complication: Secondary | ICD-10-CM

## 2022-02-11 NOTE — Patient Instructions (Signed)
Visit Information ? ?Thank you for taking time to visit with me today. Please don't hesitate to contact me if I can be of assistance to you before our next scheduled telephone appointment. ? ?Following are the goals we discussed today:  ? Goals Addressed   ? ?  ?  ?  ?  ? This Visit's Progress  ?  Pharmacy Goals     ?  Our goal A1c is less than 7%. This corresponds with fasting sugars less than 130 and 2 hour after meal sugars less than 180. Please check your blood sugar and keep a log of the results ? ?Please check your home blood pressure, keep a log of the results and bring this with you to your medical appointments. ? ?Our goal bad cholesterol, or LDL, is less than 70 . This is why it is important to continue taking your atorvastatin. ? ? ?Wallace Cullens, PharmD, BCACP ?Clinical Pharmacist ?[facility] ?[place] ?[phone] ?  ? ?  ? ? ? ?Our next appointment is by telephone in April ? ?Please call the care guide team at [phone] if you need to cancel or reschedule your appointment.  ? ? ?Patient verbalizes understanding of instructions and care plan provided today and agrees to view in [place]. Active MyChart status confirmed with patient.   ? ?

## 2022-02-11 NOTE — Chronic Care Management (AMB) (Signed)
Chronic Care Management CCM Pharmacy Note  02/11/2022 Name:  Eric Andrews MRN:  [id number] DOB:  07-02-1951   Subjective: Eric Andrews is an 71 y.o. year old male who is Andrews primary patient of Eric Hauser, DO.  The CCM team was consulted for assistance with disease management and care coordination needs.    Receive Andrews voicemail from patient requesting Andrews call back.  Engaged with patient by telephone for follow up visit for pharmacy case management and/or care coordination services.   Objective:  Medications Reviewed Today     Reviewed by Eric Andrews, Eric Andrews (Pharmacist) on 02/11/22 at 1619  Med List Status: <None>   Medication Order Taking? Sig Documenting Provider Last Dose Status Informant  aspirin 81 MG EC tablet [id number]  Take 81 mg by mouth daily. [provider]  Active   atorvastatin (LIPITOR) 20 MG tablet [id number]  TAKE 1 TABLET BY MOUTH AT BEDTIME Karamalegos, [name], DO  Active   Bacillus Coagulans-Inulin (PROBIOTIC) 1-250 BILLION-MG CAPS [id number]  Take 1 capsule by mouth daily.  Patient not taking: Reported on 08/01/2021   Ward, Delice Bison, DO  Active   busPIRone (BUSPAR) 10 MG tablet [id number]  Take 1 tablet (10 mg total) by mouth 2 (two) times daily. Eric Hauser, DO  Active   celecoxib (CELEBREX) 200 MG capsule [id number]  TAKE 1 CAPSULE(200 MG) BY MOUTH TWICE DAILY Karamalegos, [name], DO  Active   cetirizine (ZYRTEC) 10 MG tablet [id number]  Take 1 tablet (10 mg total) by mouth at bedtime. Eric Hauser, DO  Active   chlorthalidone (HYGROTON) 25 MG tablet [id number]  TAKE 1 TABLET(25 MG) BY MOUTH DAILY Karamalegos, [name], DO  Active   cholecalciferol (VITAMIN D3) 25 MCG (1000 UNIT) tablet [id number]  Take 1,000 Units by mouth daily. [provider]  Active   FARXIGA 10 MG TABS tablet [id number] Yes Take 10 mg by mouth daily before breakfast. Eric Hauser, DO Taking Active   fenofibrate  micronized (LOFIBRA) 134 MG capsule [id number]  Take 1 capsule (134 mg total) by mouth daily before breakfast. Eric Hauser, DO  Active   finasteride (PROSCAR) 5 MG tablet [id number]  TAKE 1 TABLET(5 MG) BY MOUTH EVERY EVENING Karamalegos, [name], DO  Active   Fluocinolone Acetonide (DERMOTIC) 0.01 % OIL [id number]  Place 1 drop in ear(s) once Andrews week. Apply 1 -2 gtts to ears on Sundays Ralene Bathe, MD  Active   gabapentin (NEURONTIN) 600 MG tablet [id number]  Take by mouth. Take 1 tablet (600 mg total) by mouth 4 (four) times daily [provider]  Active            Med Note Eric Andrews, Eric Andrews   Mon Jan 05, 2022 10:42 AM) Taking 600 mg in the morning, 600 mg in the afternoon, 1000 mg at dinnertime/bedtime and 600 mg when wakes up ~ midnight   hydrocortisone 2.5 % cream [id number]  Apply to aa's of face and ears QD on Monday, Wednesday, and Friday. Ralene Bathe, MD  Active   insulin degludec [facility]) 100 UNIT/ML FlexTouch Pen [id number] Yes Inject 68 Units into the skin at bedtime. Eric Hauser, DO Taking Active   Insulin Pen Needle 31G X 5 MM MISC [id number]  Use with insulin to inject into skin 3 times daily as directed Eric Hauser, DO  Active   ketoconazole (NIZORAL) 2 % cream [id number]  Apply to aa's face and ears QD on Tuesday,  Thursday, and Saturday. Ralene Bathe, MD  Active   losartan (COZAAR) 50 MG tablet [id number]  TAKE 1 TABLET(50 MG) BY MOUTH DAILY Eric Hauser, DO  Active   metFORMIN (GLUCOPHAGE-XR) 500 MG 24 hr tablet [id number] Yes TAKE 2 TABLETS(1000 MG) BY MOUTH TWICE DAILY WITH Andrews MEAL Karamalegos, [name], DO Taking Active   Multiple Vitamin (MULTIVITAMIN) capsule [id number]  Take 1 capsule by mouth daily. [provider]  Active   NOVOLOG FLEXPEN 100 UNIT/ML FlexPen [id number] Yes Inject 14 Units into the skin 3 (three) times daily with meals. Sliding scale Eric Hauser, DO Taking  Active   Omega-3 Fatty Acids (FISH OIL) 1000 MG CAPS [id number]  Take by mouth in the morning and at bedtime. [provider]  Active   omeprazole (PRILOSEC) 20 MG capsule [id number]  Take 1 capsule (20 mg total) by mouth daily before breakfast. Eric Hauser, DO  Active   OZEMPIC, 1 MG/DOSE, 4 MG/3ML SOPN [id number] Yes Inject 1 mg into the skin once Andrews week. Eric Hauser, DO Taking Active   pioglitazone (ACTOS) 15 MG tablet [id number] Yes TAKE 1 TABLET(15 MG) BY MOUTH DAILY Eric Hauser, DO Taking Active   tamsulosin (FLOMAX) 0.4 MG CAPS capsule [id number]  TAKE ONE CAPSULE BY MOUTH EVERY NIGHT AT BEDTIME FOR URINE RETENTION Eric Hauser, DO  Active   traZODone (DESYREL) 100 MG tablet [id number]  Take 1 tablet (100 mg total) by mouth at bedtime. Eric Hauser, DO  Active   venlafaxine XR (EFFEXOR-XR) 150 MG 24 hr capsule [id number]  Take 1 capsule (150 mg total) by mouth daily with breakfast. Eric Hauser, DO  Active             Pertinent Labs:  Lab Results  Component Value Date   HGBA1C 8.5 (H) 07/01/2021   Lab Results  Component Value Date   CHOL 122 07/01/2021   HDL 33 (L) 07/01/2021   LDLCALC 62 07/01/2021   TRIG 209 (H) 07/01/2021   CHOLHDL 3.7 07/01/2021   Lab Results  Component Value Date   CREATININE 1.20 07/29/2021   BUN 21 07/29/2021   NA 133 (L) 07/29/2021   K 3.5 07/29/2021   CL 92 (L) 07/29/2021   CO2 28 07/29/2021    SDOH:  (Social Determinants of Health) assessments and interventions performed:    [place]  Review of patient past medical history, allergies, medications, health status, including review of consultants reports, laboratory and other test data, was performed as part of comprehensive evaluation and provision of chronic care management services.   Care Plan : PharmD - Disease Management T2DM, osteoarthritis  Updates made by Eric Andrews, Eric Andrews since 02/11/2022  12:00 AM     Problem: Disease Progression      Long-Range Goal: Disease Progression Prevented or Minimized   Start Date: 11/06/2020  Expected End Date: 01/05/2021  Recent Progress: On track  Priority: High  Note:   Current Barriers:  Unable to independently afford treatment regimen, particularly when enters coverage gap of [employer] Part D plan Patient APPROVED for patient assistance from [employer] for [place], [place] through 12/06/2022 Patient APPROVED for patient assistance from [employer] for [place] through 12/06/2022  Pharmacist Clinical Goal(s):  Over the next 60 days, patient will verbalize ability to afford treatment regimen through collaboration with PharmD and provider.   Interventions: 1:1 collaboration with Eric Hauser, DO regarding development and update of comprehensive plan of care  as evidenced by provider attestation and co-signature Inter-disciplinary care team collaboration (see longitudinal plan of care) Receive Andrews voicemail from patient requesting Andrews call back regarding medication assistance from [employer]   Medication Assistance: Collaborated with [place] for aid to patient with reapplying for patient assistance from [employer] and [employer] for 2023 calendar year Note previously contacted [employer] and confirmed patient APPROVED for re-enrollment in [employer] for [place], [place] for 2023 calendar year Today reports that he has not yet received refills from patient assistance program or contacted program. However, reports picked up supplies of both Ozempic and [place] from [employer]. States copayments were affordable for now and will contact pharmacy/office if needing additional refill before receives supply from assistance program. Will send message to [place] to request assistance to patient following up with [employer] regarding shipment of medications for 2023 calendar year From review  of chart, Note [facility] CPhT contacted [employer] on 1/20 and was advised patient APPROVED 12/07/21-12/06/22 for re-enrollment in their program for [place] Patient confirms received supply of Farxiga from program this year  Type 2 Diabetes  Uncontrolled; current treatment Tresiba 68 units daily in evening  Previous direction from PCP regarding fasting sugar reading in AM: if consistently < 150 reduce by about 2 units per 3 days or more Novolog - has reported taking three times daily with meals following sliding scale from previous Endocrinologist in [place] Reported sliding scale based on blood sugar prior to each meal: If less than 140: takes 13 units If 140-200: takes 14 units If 200-250: takes 15 units If 250 or above: takes 16 units Metformin ER 500 mg - 2 tablets (1000 mg) twice daily Pioglitazone 15 mg once daily Farxiga 10 mg daily  Ozempic 1 mg weekly Lipid management: atorvastatin 20 mg daily Reports recent home fasting blood sugars ranging: 110-140 Have discussed role of diet and exercise on blood sugar control Have counseled on s/s of hypoglycemia and how to manage low blood sugars Have counseled patient on importance of guidance from provider for adjustment of medications; patient has expressed preference for more self-management of his blood sugar control.    Patient Goals/Self-Care Activities Over the next 60 days, patient will:  - collaborate with provider on medication access solutions - patient to call office for new or worsening medical concerns - attend medical appointments as scheduled  Follow Up Plan: CM Pharmacist will follow up with patient again in the next month      Wallace Cullens, PharmD, Para [place], [facility] [place] [phone]

## 2022-02-12 ENCOUNTER — Telehealth: Payer: Self-pay

## 2022-02-12 NOTE — Telephone Encounter (Signed)
Left message advising pt his Novolog has been delivered and he can come by and pick it up.  ? ?Thanks,  ? ?-Mickel Baas  ?

## 2022-02-18 DIAGNOSIS — M4802 Spinal stenosis, cervical region: Secondary | ICD-10-CM | POA: Diagnosis not present

## 2022-03-01 ENCOUNTER — Other Ambulatory Visit: Payer: Self-pay | Admitting: Family Medicine

## 2022-03-01 DIAGNOSIS — E1169 Type 2 diabetes mellitus with other specified complication: Secondary | ICD-10-CM

## 2022-03-01 DIAGNOSIS — J3089 Other allergic rhinitis: Secondary | ICD-10-CM

## 2022-03-02 DIAGNOSIS — R7309 Other abnormal glucose: Secondary | ICD-10-CM | POA: Diagnosis not present

## 2022-03-03 ENCOUNTER — Other Ambulatory Visit (HOSPITAL_COMMUNITY): Payer: Self-pay | Admitting: Orthopedic Surgery

## 2022-03-03 ENCOUNTER — Other Ambulatory Visit: Payer: Self-pay | Admitting: Orthopedic Surgery

## 2022-03-03 DIAGNOSIS — M1712 Unilateral primary osteoarthritis, left knee: Secondary | ICD-10-CM

## 2022-03-03 NOTE — Telephone Encounter (Signed)
Requested Prescriptions  ?Pending Prescriptions Disp Refills  ?? fenofibrate micronized (LOFIBRA) 134 MG capsule [Pharmacy Med Name: FENOFIBRATE 134MG CAPSULES] 90 capsule 1  ?  Sig: TAKE 1 CAPSULE(134 MG) BY MOUTH DAILY BEFORE BREAKFAST  ?  ? Cardiovascular:  Antilipid - Fibric Acid Derivatives Failed - 03/01/2022  6:24 AM  ?  ?  Failed - PLT in normal range and within 360 days  ?  Platelets  ?Date Value Ref Range Status  ?07/29/2021 141 (L) 150 - 400 K/uL Final  ?   ?  ?  Failed - Lipid Panel in normal range within the last 12 months  ?  Cholesterol  ?Date Value Ref Range Status  ?07/01/2021 122 <200 mg/dL Final  ? ?LDL Cholesterol (Calc)  ?Date Value Ref Range Status  ?07/01/2021 62 mg/dL (calc) Final  ?  Comment:  ?  Reference range: <100 ?Marland Kitchen ?Desirable range <100 mg/dL for primary prevention;   ?<70 mg/dL for patients with CHD or diabetic patients  ?with > or = 2 CHD risk factors. ?. ?LDL-C is now calculated using the Martin-Hopkins  ?calculation, which is a validated novel method providing  ?better accuracy than the Friedewald equation in the  ?estimation of LDL-C.  ?Cresenciano Genre et al. Annamaria Helling. 7425;956(38): 2061-2068  ?(http://education.QuestDiagnostics.com/faq/FAQ164) ?  ? ?HDL  ?Date Value Ref Range Status  ?07/01/2021 33 (L) > OR = 40 mg/dL Final  ? ?Triglycerides  ?Date Value Ref Range Status  ?07/01/2021 209 (H) <150 mg/dL Final  ?  Comment:  ?  . ?If a non-fasting specimen was collected, consider ?repeat triglyceride testing on a fasting specimen ?if clinically indicated.  ?Garald Balding al. J. of Clin. Lipidol. 7564;3:329-518. ?. ?  ? ?  ?  ?  Passed - ALT in normal range and within 360 days  ?  ALT  ?Date Value Ref Range Status  ?07/29/2021 42 0 - 44 U/L Final  ?   ?  ?  Passed - AST in normal range and within 360 days  ?  AST  ?Date Value Ref Range Status  ?07/29/2021 28 15 - 41 U/L Final  ?   ?  ?  Passed - Cr in normal range and within 360 days  ?  Creat  ?Date Value Ref Range Status  ?07/01/2021 1.12 0.70  - 1.35 mg/dL Final  ? ?Creatinine, Ser  ?Date Value Ref Range Status  ?07/29/2021 1.20 0.61 - 1.24 mg/dL Final  ?   ?  ?  Passed - HGB in normal range and within 360 days  ?  Hemoglobin  ?Date Value Ref Range Status  ?07/29/2021 16.2 13.0 - 17.0 g/dL Final  ?   ?  ?  Passed - HCT in normal range and within 360 days  ?  HCT  ?Date Value Ref Range Status  ?07/29/2021 46.8 39.0 - 52.0 % Final  ?   ?  ?  Passed - WBC in normal range and within 360 days  ?  WBC  ?Date Value Ref Range Status  ?07/29/2021 8.6 4.0 - 10.5 K/uL Final  ?   ?  ?  Passed - eGFR is 30 or above and within 360 days  ?  GFR, Estimated  ?Date Value Ref Range Status  ?07/29/2021 >60 >60 mL/min Final  ?  Comment:  ?  (NOTE) ?Calculated using the CKD-EPI Creatinine Equation (2021) ?  ? ?eGFR  ?Date Value Ref Range Status  ?07/01/2021 71 > OR = 60 mL/min/1.21m Final  ?  Comment:  ?  The eGFR is based on the CKD-EPI 2021 equation. To calculate  ?the new eGFR from a previous Creatinine or Cystatin C ?result, go to https://www.kidney.org/professionals/ ?kdoqi/gfr%5Fcalculator ?  ?   ?  ?  Passed - Valid encounter within last 12 months  ?  Recent Outpatient Visits   ?      ? 2 months ago Chronic pain of both knees  ? [place], DO  ? 7 months ago Intestinal infection due to enteropathogenic E. coli  ? [facility] [place], Coralie Keens, NP  ? 7 months ago Annual physical exam  ? [place], DO  ? 10 months ago Hematospermia  ? [place], DO  ? 1 year ago Type 2 diabetes mellitus with other specified complication, with long-term current use of insulin ([place])  ? [place], DO  ?  ?  ?Future Appointments   ?        ? In 3 weeks Ralene Bathe, MD [place]  ? In 4 months  [place]  ?  ? ?  ?  ?  ?? cetirizine (ZYRTEC) 10 MG tablet [Pharmacy Med  Name: CETIRIZINE 10MG TABLETS] 90 tablet 1  ?  Sig: TAKE 1 TABLET(10 MG) BY MOUTH AT BEDTIME  ?  ? Ear, Nose, and Throat:  Antihistamines 2 Passed - 03/01/2022  6:24 AM  ?  ?  Passed - Cr in normal range and within 360 days  ?  Creat  ?Date Value Ref Range Status  ?07/01/2021 1.12 0.70 - 1.35 mg/dL Final  ? ?Creatinine, Ser  ?Date Value Ref Range Status  ?07/29/2021 1.20 0.61 - 1.24 mg/dL Final  ?   ?  ?  Passed - Valid encounter within last 12 months  ?  Recent Outpatient Visits   ?      ? 2 months ago Chronic pain of both knees  ? [place], DO  ? 7 months ago Intestinal infection due to enteropathogenic E. coli  ? [facility] [place], Coralie Keens, NP  ? 7 months ago Annual physical exam  ? [place], DO  ? 10 months ago Hematospermia  ? [place], DO  ? 1 year ago Type 2 diabetes mellitus with other specified complication, with long-term current use of insulin ([place])  ? [place], DO  ?  ?  ?Future Appointments   ?        ? In 3 weeks Ralene Bathe, MD [place]  ? In 4 months  [facility] [place]  ?  ? ?  ?  ?  ? ? ?

## 2022-03-05 DIAGNOSIS — M4802 Spinal stenosis, cervical region: Secondary | ICD-10-CM | POA: Diagnosis not present

## 2022-03-06 DIAGNOSIS — Z794 Long term (current) use of insulin: Secondary | ICD-10-CM

## 2022-03-06 DIAGNOSIS — E1169 Type 2 diabetes mellitus with other specified complication: Secondary | ICD-10-CM

## 2022-03-10 ENCOUNTER — Ambulatory Visit
Admission: RE | Admit: 2022-03-10 | Discharge: 2022-03-10 | Disposition: A | Discharge status: Home / Self Care | Payer: Medicare Other | Source: Ambulatory Visit | Attending: Orthopedic Surgery | Admitting: Orthopedic Surgery

## 2022-03-10 DIAGNOSIS — M1712 Unilateral primary osteoarthritis, left knee: Secondary | ICD-10-CM | POA: Diagnosis not present

## 2022-03-10 DIAGNOSIS — M25562 Pain in left knee: Secondary | ICD-10-CM | POA: Diagnosis not present

## 2022-03-12 DIAGNOSIS — M4802 Spinal stenosis, cervical region: Secondary | ICD-10-CM | POA: Diagnosis not present

## 2022-03-13 ENCOUNTER — Ambulatory Visit (INDEPENDENT_AMBULATORY_CARE_PROVIDER_SITE_OTHER): Payer: Medicare Other | Admitting: Pharmacist

## 2022-03-13 DIAGNOSIS — Z794 Long term (current) use of insulin: Secondary | ICD-10-CM

## 2022-03-13 DIAGNOSIS — E1169 Type 2 diabetes mellitus with other specified complication: Secondary | ICD-10-CM

## 2022-03-13 NOTE — Patient Instructions (Signed)
Visit Information ? ?Thank you for taking time to visit with me today. Please don't hesitate to contact me if I can be of assistance to you before our next scheduled telephone appointment. ? ?Following are the goals we discussed today:  ? Goals Addressed   ? ?  ?  ?  ?  ? This Visit's Progress  ?  Pharmacy Goals     ?  Our goal A1c is less than 7%. This corresponds with fasting sugars less than 130 and 2 hour after meal sugars less than 180. Please check your blood sugar and keep a log of the results ? ?Please check your home blood pressure, keep a log of the results and bring this with you to your medical appointments. ? ?Our goal bad cholesterol, or LDL, is less than 70 . This is why it is important to continue taking your atorvastatin. ? ?Wallace Cullens, PharmD, BCACP ?Clinical Pharmacist ?[facility] ?[place] ?[phone] ?  ? ?  ? ? ? ?Our next appointment is by telephone in November ? ?Please call the care guide team at [phone] if you need to cancel or reschedule your appointment.  ? ? ?Patient verbalizes understanding of instructions and care plan provided today and agrees to view in [place]. Active MyChart status confirmed with patient.   ? ?

## 2022-03-13 NOTE — Chronic Care Management (AMB) (Signed)
? ?Chronic Care Management ?CCM Pharmacy Note ? ?03/13/2022 ?Name:  Eric Andrews MRN:  [id number] DOB:  Jun 15, 1951 ? ? ?Subjective: ?Eric Andrews is an 71 y.o. year old male who is a primary patient of [name], DO.  The CCM team was consulted for assistance with disease management and care coordination needs.   ? ?Engaged with patient by telephone for follow up visit for pharmacy case management and/or care coordination services.  ? ?Objective: ? ?Medications Reviewed Today   ? ? Reviewed by Rennis Petty, RPH-CPP (Pharmacist) on 02/11/22 at 1619  Med List Status: <None>  ? ?Medication Order Taking? Sig Documenting Provider Last Dose Status Informant  ?aspirin 81 MG EC tablet [id number]  Take 81 mg by mouth daily. [provider]  Active   ?atorvastatin (LIPITOR) 20 MG tablet [id number]  TAKE 1 TABLET BY MOUTH AT BEDTIME [name], DO  Active   ?Bacillus Coagulans-Inulin (PROBIOTIC) 1-250 BILLION-MG CAPS [id number]  Take 1 capsule by mouth daily.  ?Patient not taking: Reported on 08/01/2021  ? Ward, Delice Bison, DO  Active   ?busPIRone (BUSPAR) 10 MG tablet [id number]  Take 1 tablet (10 mg total) by mouth 2 (two) times daily. [name], DO  Active   ?celecoxib (CELEBREX) 200 MG capsule [id number]  TAKE 1 CAPSULE(200 MG) BY MOUTH TWICE DAILY Karamalegos, [name], DO  Active   ?cetirizine (ZYRTEC) 10 MG tablet [id number]  Take 1 tablet (10 mg total) by mouth at bedtime. [name], DO  Active   ?chlorthalidone (HYGROTON) 25 MG tablet [id number]  TAKE 1 TABLET(25 MG) BY MOUTH DAILY [name], DO  Active   ?cholecalciferol (VITAMIN D3) 25 MCG (1000 UNIT) tablet [id number]  Take 1,000 Units by mouth daily. [provider]  Active   ?FARXIGA 10 MG TABS tablet [id number] Yes Take 10 mg by mouth daily before breakfast. [name], DO Taking Active   ?fenofibrate micronized (LOFIBRA) 134 MG capsule [id number]  Take 1 capsule  (134 mg total) by mouth daily before breakfast. [name], DO  Active   ?finasteride (PROSCAR) 5 MG tablet [id number]  TAKE 1 TABLET(5 MG) BY MOUTH EVERY EVENING Karamalegos, [name], DO  Active   ?Fluocinolone Acetonide (DERMOTIC) 0.01 % OIL [id number]  Place 1 drop in ear(s) once a week. Apply 1 -2 gtts to ears on Sundays Ralene Bathe, MD  Active   ?gabapentin (NEURONTIN) 600 MG tablet [id number]  Take by mouth. Take 1 tablet (600 mg total) by mouth 4 (four) times daily [provider]  Active   ?         ?Med Note [name], Vala Raffo A   Mon Jan 05, 2022 10:42 AM) Taking 600 mg in the morning, 600 mg in the afternoon, 1000 mg at dinnertime/bedtime and 600 mg when wakes up ~ midnight   ?hydrocortisone 2.5 % cream [id number]  Apply to aa's of face and ears QD on Monday, Wednesday, and Friday. Ralene Bathe, MD  Active   ?insulin degludec (TRESIBA FLEXTOUCH) 100 UNIT/ML FlexTouch Pen [id number] Yes Inject 68 Units into the skin at bedtime. [name], DO Taking Active   ?Insulin Pen Needle 31G X 5 MM MISC [id number]  Use with insulin to inject into skin 3 times daily as directed [name], DO  Active   ?ketoconazole (NIZORAL) 2 % cream [id number]  Apply to aa's face and ears QD on Tuesday, Thursday, and Saturday. Ralene Bathe, MD  Active   ?  losartan (COZAAR) 50 MG tablet [id number]  TAKE 1 TABLET(50 MG) BY MOUTH DAILY Karamalegos, [name], DO  Active   ?metFORMIN (GLUCOPHAGE-XR) 500 MG 24 hr tablet [id number] Yes TAKE 2 TABLETS(1000 MG) BY MOUTH TWICE DAILY WITH A MEAL Karamalegos, [name], DO Taking Active   ?Multiple Vitamin (MULTIVITAMIN) capsule [id number]  Take 1 capsule by mouth daily. [provider]  Active   ?NOVOLOG FLEXPEN 100 UNIT/ML FlexPen [id number] Yes Inject 14 Units into the skin 3 (three) times daily with meals. Sliding scale [name], DO Taking Active   ?Omega-3 Fatty Acids (FISH OIL) 1000 MG CAPS  [id number]  Take by mouth in the morning and at bedtime. [provider]  Active   ?omeprazole (PRILOSEC) 20 MG capsule [id number]  Take 1 capsule (20 mg total) by mouth daily before breakfast. [name], DO  Active   ?OZEMPIC, 1 MG/DOSE, 4 MG/3ML SOPN [id number] Yes Inject 1 mg into the skin once a week. [name], DO Taking Active   ?pioglitazone (ACTOS) 15 MG tablet [id number] Yes TAKE 1 TABLET(15 MG) BY MOUTH DAILY [name], DO Taking Active   ?tamsulosin (FLOMAX) 0.4 MG CAPS capsule [id number]  TAKE ONE CAPSULE BY MOUTH EVERY NIGHT AT BEDTIME FOR URINE RETENTION [name], DO  Active   ?traZODone (DESYREL) 100 MG tablet [id number]  Take 1 tablet (100 mg total) by mouth at bedtime. [name], DO  Active   ?venlafaxine XR (EFFEXOR-XR) 150 MG 24 hr capsule [id number]  Take 1 capsule (150 mg total) by mouth daily with breakfast. [name], DO  Active   ? ?  ?  ? ?  ? ? ?Pertinent Labs:  ?Lab Results  ?Component Value Date  ? HGBA1C 8.5 (H) 07/01/2021  ?Per shared record from [place] A1C 7.8% on 03/02/2022 ? ?Lab Results  ?Component Value Date  ? CHOL 122 07/01/2021  ? HDL 33 (L) 07/01/2021  ? [place] 62 07/01/2021  ? TRIG 209 (H) 07/01/2021  ? CHOLHDL 3.7 07/01/2021  ? ?Lab Results  ?Component Value Date  ? CREATININE 1.20 07/29/2021  ? BUN 21 07/29/2021  ? NA 133 (L) 07/29/2021  ? K 3.5 07/29/2021  ? CL 92 (L) 07/29/2021  ? CO2 28 07/29/2021  ? ? ?SDOH:  (Social Determinants of Health) assessments and interventions performed:  ? ? ?CCM Care Plan ? ?Review of patient past medical history, allergies, medications, health status, including review of consultants reports, laboratory and other test data, was performed as part of comprehensive evaluation and provision of chronic care management services.  ? ?Care Plan : PharmD - Disease Management T2DM, osteoarthritis  ?Updates made by Rennis Petty,  RPH-CPP since 03/13/2022 12:00 AM  ?  ? ?Problem: Disease Progression   ?  ? ?Long-Range Goal: Disease Progression Prevented or Minimized   ?Start Date: 11/06/2020  ?Expected End Date: 01/05/2021  ?Recent Progress: On track  ?Priority: High  ?Note:   ?Current Barriers:  ?Unable to independently afford treatment regimen, particularly when enters coverage gap of [employer] Part D plan ?Patient APPROVED for patient assistance from [employer] for [place], [place] through 12/06/2022 ?Patient APPROVED for patient assistance from [employer] for Farxiga through 12/06/2022 ? ?Pharmacist Clinical Goal(s):  ?Over the next 60 days, patient will verbalize ability to afford treatment regimen through collaboration with PharmD and provider.  ? ?Interventions: ?1:1 collaboration with [name], DO regarding development and update of comprehensive plan of  care as evidenced by provider attestation and co-signature ?Inter-disciplinary care team collaboration (see longitudinal plan of care) ?Reports planning to have a left knee replacement surgery, but does not yet have a surgery date ? ?Medication Assistance: ?Collaborated with [facility] for aid to patient with reapplying for patient assistance from [employer] and [employer] for 2023 calendar year ?Patient APPROVED for re-enrollment in [employer] for [place], [place] for 2023 calendar year ?Today confirms received refills of Ozempic, Novolog and Tresiba from patient assistance program in March ?From review of chart, Note [facility] CPhT contacted [employer] on 1/20 and was advised patient APPROVED 12/07/21-12/06/22 for re-enrollment in their program for [place] ?Patient received supply of Farxiga from program this year ? ?Type 2 Diabetes  ?A1C improved based on latest 7.8% result from shared record with [place] on 03/02/2022; current treatment ?Tresiba 66 units daily in evening ?Previous direction from PCP regarding  fasting sugar reading in AM: if consistently < 150 reduce by about 2 units per 3 days or more ?Novolog - has reported taking three times daily with meals following sliding scale from previous Endocrinologist in [place]

## 2022-03-20 ENCOUNTER — Other Ambulatory Visit: Payer: Self-pay | Admitting: Family Medicine

## 2022-03-20 DIAGNOSIS — N401 Enlarged prostate with lower urinary tract symptoms: Secondary | ICD-10-CM

## 2022-03-23 DIAGNOSIS — G479 Sleep disorder, unspecified: Secondary | ICD-10-CM | POA: Diagnosis not present

## 2022-03-23 DIAGNOSIS — M542 Cervicalgia: Secondary | ICD-10-CM | POA: Diagnosis not present

## 2022-03-23 DIAGNOSIS — R200 Anesthesia of skin: Secondary | ICD-10-CM | POA: Diagnosis not present

## 2022-03-23 DIAGNOSIS — G8929 Other chronic pain: Secondary | ICD-10-CM | POA: Diagnosis not present

## 2022-03-23 DIAGNOSIS — M545 Low back pain, unspecified: Secondary | ICD-10-CM | POA: Diagnosis not present

## 2022-03-23 DIAGNOSIS — G4733 Obstructive sleep apnea (adult) (pediatric): Secondary | ICD-10-CM | POA: Diagnosis not present

## 2022-03-23 DIAGNOSIS — E1169 Type 2 diabetes mellitus with other specified complication: Secondary | ICD-10-CM | POA: Diagnosis not present

## 2022-03-23 DIAGNOSIS — Z794 Long term (current) use of insulin: Secondary | ICD-10-CM | POA: Diagnosis not present

## 2022-03-23 DIAGNOSIS — M79602 Pain in left arm: Secondary | ICD-10-CM | POA: Diagnosis not present

## 2022-03-23 DIAGNOSIS — R2689 Other abnormalities of gait and mobility: Secondary | ICD-10-CM | POA: Diagnosis not present

## 2022-03-23 NOTE — Telephone Encounter (Signed)
Requested medication (s) are due for refill today: yes ? ?Requested medication (s) are on the active medication list: yes ? ?Last refill:  01/01/22 #90 with 0 RF ? ?Future visit scheduled: no ? ?Notes to clinic:  Has already had a curtesy refill and there is no upcoming appointment scheduled, please assess. ? ? ? ?  ? ?Requested Prescriptions  ?Pending Prescriptions Disp Refills  ? tamsulosin (FLOMAX) 0.4 MG CAPS capsule [Pharmacy Med Name: TAMSULOSIN 0.'4MG'$  CAPSULES] 90 capsule 0  ?  Sig: TAKE 1 CAPSULE BY MOUTH EVERY NIGHT AT BEDTIME FOR URINE RETENTION  ?  ? Urology: Alpha-Adrenergic Blocker Passed - 03/20/2022  3:49 PM  ?  ?  Passed - PSA in normal range and within 360 days  ?  PSA  ?Date Value Ref Range Status  ?07/01/2021 0.10 < OR = 4.00 ng/mL Final  ?  Comment:  ?  The total PSA value from this assay system is  ?standardized against the WHO standard. The test  ?result will be approximately 20% lower when compared  ?to the equimolar-standardized total PSA (Beckman  ?Coulter). Comparison of serial PSA results should be  ?interpreted with this fact in mind. ?. ?This test was performed using the Siemens  ?chemiluminescent method. Values obtained from  ?different assay methods cannot be used ?interchangeably. PSA levels, regardless of ?value, should not be interpreted as absolute ?evidence of the presence or absence of disease. ?  ?  ?  ?  ?  Passed - Last BP in normal range  ?  BP Readings from Last 1 Encounters:  ?12/17/21 134/76  ?  ?  ?  ?  Passed - Valid encounter within last 12 months  ?  Recent Outpatient Visits   ? ?      ? 3 months ago Chronic pain of both knees  ? [place], DO  ? 7 months ago Intestinal infection due to enteropathogenic E. coli  ? [facility] [place], Coralie Keens, NP  ? 8 months ago Annual physical exam  ? [place], DO  ? 11 months ago Hematospermia  ? [place], DO  ? 1 year ago Type 2 diabetes mellitus with other specified complication, with long-term current use of insulin ([place])  ? [place], DO  ? ?  ?  ?Future Appointments   ? ?        ? Tomorrow Ralene Bathe, MD [place]  ? In 4 months  [facility], [place]  ? ?  ? ? ?  ?  ?  ? ? ?

## 2022-03-24 ENCOUNTER — Ambulatory Visit: Payer: Medicare Other | Admitting: Dermatology

## 2022-03-24 DIAGNOSIS — L03011 Cellulitis of right finger: Secondary | ICD-10-CM

## 2022-03-24 DIAGNOSIS — D229 Melanocytic nevi, unspecified: Secondary | ICD-10-CM | POA: Diagnosis not present

## 2022-03-24 DIAGNOSIS — L578 Other skin changes due to chronic exposure to nonionizing radiation: Secondary | ICD-10-CM | POA: Diagnosis not present

## 2022-03-24 DIAGNOSIS — L821 Other seborrheic keratosis: Secondary | ICD-10-CM | POA: Diagnosis not present

## 2022-03-24 DIAGNOSIS — D1800 Hemangioma unspecified site: Secondary | ICD-10-CM | POA: Diagnosis not present

## 2022-03-24 DIAGNOSIS — L570 Actinic keratosis: Secondary | ICD-10-CM | POA: Diagnosis not present

## 2022-03-24 DIAGNOSIS — Z1283 Encounter for screening for malignant neoplasm of skin: Secondary | ICD-10-CM | POA: Diagnosis not present

## 2022-03-24 DIAGNOSIS — L820 Inflamed seborrheic keratosis: Secondary | ICD-10-CM | POA: Diagnosis not present

## 2022-03-24 DIAGNOSIS — L219 Seborrheic dermatitis, unspecified: Secondary | ICD-10-CM

## 2022-03-24 DIAGNOSIS — L814 Other melanin hyperpigmentation: Secondary | ICD-10-CM

## 2022-03-24 MED ORDER — MUPIROCIN 2 % EX OINT: TOPICAL_OINTMENT | CUTANEOUS | 2 refills | Status: DC

## 2022-03-24 NOTE — Progress Notes (Signed)
? ?Follow-Up Visit ?  ?Subjective  ?Eric Andrews is a 71 y.o. male who presents for the following: Annual Exam (Mole check ) and Skin Problem (Check scratch on the right 4 th finger ). ?The patient presents for Total-Body Skin Exam (TBSE) for skin cancer screening and mole check.  The patient has spots, moles and lesions to be evaluated, some may be new or changing and the patient has concerns that these could be cancer. ? ?The following portions of the chart were reviewed this encounter and updated as appropriate:  ? Tobacco  Allergies  Meds  Problems  Med Hx  Surg Hx  Fam Hx   ?  ?Review of Systems:  No other skin or systemic complaints except as noted in HPI or Assessment and Plan. ? ?Objective  ?Well appearing patient in no apparent distress; mood and affect are within normal limits. ? ?A full examination was performed including scalp, head, eyes, ears, nose, lips, neck, chest, axillae, abdomen, back, buttocks, bilateral upper extremities, bilateral lower extremities, hands, feet, fingers, toes, fingernails, and toenails. All findings within normal limits unless otherwise noted below. ? ?face, ears ?Pink patches with greasy scale.  ? ?back ?#2 Subcutaneous nodule.  ? ?right 4th finger ?Erythema with crust ? ?right cheek x 1 ?Erythematous thin papules/macules with gritty scale.  ? ? ?Assessment & Plan  ?Seborrheic dermatitis ?face, ears ?Seborrheic Dermatitis  ?-  is a chronic persistent rash characterized by pinkness and scaling most commonly of the mid face but also can occur on the scalp (dandruff), ears; mid chest, mid back and groin.  It tends to be exacerbated by stress and cooler weather.  People who have neurologic disease may experience new onset or exacerbation of existing seborrheic dermatitis.  The condition is not curable but treatable and can be controlled.  ? ?Recommend : ?hydrocortisone 2 and half percent cream alternating with ketoconazole 2% cream.  May also use DermOtic oil for the ears  weekly as needed. ? ?Related Medications ?hydrocortisone 2.5 % cream ?Apply to aa's of face and ears QD on Monday, Wednesday, and Friday. ?ketoconazole (NIZORAL) 2 % cream ?Apply to aa's face and ears QD on Tuesday, Thursday, and Saturday. ?Fluocinolone Acetonide (DERMOTIC) 0.01 % OIL ?Place 1 drop in ear(s) once a week. Apply 1 -2 gtts to ears on Sundays ? ?Inflamed seborrheic keratosis ?back ?Irritated ?Reassured benign age-related growth.  Recommend observation.  Discussed cryotherapy if spot(s) become irritated or inflamed.  ? ?Destruction of lesion - back ?Complexity: simple   ?Destruction method: cryotherapy   ?Informed consent: discussed and consent obtained   ?Timeout:  patient name, date of birth, surgical site, and procedure verified ?Lesion destroyed using liquid nitrogen: Yes   ?Region frozen until ice ball extended beyond lesion: Yes   ?Outcome: patient tolerated procedure well with no complications   ?Post-procedure details: wound care instructions given   ? ?Cellulitis of finger of right hand ?right 4th finger ?Start Mupirocin ointment apply to sore on finger and to other sores of skin prn  ?Related Medications ?mupirocin ointment (BACTROBAN) 2 % ?Apply to sore on finger and sores on skin prn ? ?AK (actinic keratosis) ?right cheek x 1 ?Actinic keratoses are precancerous spots that appear secondary to cumulative UV radiation exposure/sun exposure over time. They are chronic with expected duration over 1 year. A portion of actinic keratoses will progress to squamous cell carcinoma of the skin. It is not possible to reliably predict which spots will progress to skin cancer and so treatment  is recommended to prevent development of skin cancer. ? ?Recommend daily broad spectrum sunscreen SPF 30+ to sun-exposed areas, reapply every 2 hours as needed.  ?Recommend staying in the shade or wearing long sleeves, sun glasses (UVA+UVB protection) and wide brim hats (4-inch brim around the entire circumference of  the hat). ?Call for new or changing lesions.  ? ?Destruction of lesion - right cheek x 1 ?Complexity: simple   ?Destruction method: cryotherapy   ?Informed consent: discussed and consent obtained   ?Timeout:  patient name, date of birth, surgical site, and procedure verified ?Lesion destroyed using liquid nitrogen: Yes   ?Region frozen until ice ball extended beyond lesion: Yes   ?Outcome: patient tolerated procedure well with no complications   ?Post-procedure details: wound care instructions given   ? ?Skin cancer screening ? ?Lentigines ?- Scattered tan macules ?- Due to sun exposure ?- Benign-appearing, observe ?- Recommend daily broad spectrum sunscreen SPF 30+ to sun-exposed areas, reapply every 2 hours as needed. ?- Call for any changes ? ?Seborrheic Keratoses ?- Stuck-on, waxy, tan-brown papules and/or plaques  ?- Benign-appearing ?- Discussed benign etiology and prognosis. ?- Observe ?- Call for any changes ? ?Melanocytic Nevi ?- Tan-brown and/or pink-flesh-colored symmetric macules and papules ?- Benign appearing on exam today ?- Observation ?- Call clinic for new or changing moles ?- Recommend daily use of broad spectrum spf 30+ sunscreen to sun-exposed areas.  ? ?Hemangiomas ?- Red papules ?- Discussed benign nature ?- Observe ?- Call for any changes ? ?Actinic Damage ?- Chronic condition, secondary to cumulative UV/sun exposure ?- diffuse scaly erythematous macules with underlying dyspigmentation ?- Recommend daily broad spectrum sunscreen SPF 30+ to sun-exposed areas, reapply every 2 hours as needed.  ?- Staying in the shade or wearing long sleeves, sun glasses (UVA+UVB protection) and wide brim hats (4-inch brim around the entire circumference of the hat) are also recommended for sun protection.  ?- Call for new or changing lesions. ? ?Skin cancer screening performed today.  ? ?Return in about 1 year (around 03/25/2023) for TBSE, hx of Seborrheic dermatitis . ? ?I, Eric Andrews, CMA, am acting as  scribe for Eric Ser, MD .  ?Documentation: I have reviewed the above documentation for accuracy and completeness, and I agree with the above. ? ?Eric Ser, MD ? ?

## 2022-03-24 NOTE — Patient Instructions (Addendum)

## 2022-03-26 ENCOUNTER — Other Ambulatory Visit: Payer: Self-pay | Admitting: Orthopedic Surgery

## 2022-03-26 DIAGNOSIS — M4802 Spinal stenosis, cervical region: Secondary | ICD-10-CM | POA: Diagnosis not present

## 2022-03-31 ENCOUNTER — Other Ambulatory Visit: Payer: Self-pay | Admitting: Family Medicine

## 2022-03-31 ENCOUNTER — Encounter: Payer: Self-pay | Admitting: Dermatology

## 2022-03-31 DIAGNOSIS — M5136 Other intervertebral disc degeneration, lumbar region: Secondary | ICD-10-CM

## 2022-03-31 DIAGNOSIS — M159 Polyosteoarthritis, unspecified: Secondary | ICD-10-CM

## 2022-04-01 NOTE — Telephone Encounter (Signed)
Requested Prescriptions  ?Pending Prescriptions Disp Refills  ?? celecoxib (CELEBREX) 200 MG capsule [Pharmacy Med Name: CELECOXIB 200MG CAPSULES] 180 capsule 0  ?  Sig: TAKE 1 CAPSULE(200 MG) BY MOUTH TWICE DAILY  ?  ? Analgesics:  COX2 Inhibitors Failed - 03/31/2022  6:19 AM  ?  ?  Failed - Manual Review: Labs are only required if the patient has taken medication for more than 8 weeks.  ?  ?  Passed - HGB in normal range and within 360 days  ?  Hemoglobin  ?Date Value Ref Range Status  ?07/29/2021 16.2 13.0 - 17.0 g/dL Final  ?   ?  ?  Passed - Cr in normal range and within 360 days  ?  Creat  ?Date Value Ref Range Status  ?07/01/2021 1.12 0.70 - 1.35 mg/dL Final  ? ?Creatinine, Ser  ?Date Value Ref Range Status  ?07/29/2021 1.20 0.61 - 1.24 mg/dL Final  ?   ?  ?  Passed - HCT in normal range and within 360 days  ?  HCT  ?Date Value Ref Range Status  ?07/29/2021 46.8 39.0 - 52.0 % Final  ?   ?  ?  Passed - AST in normal range and within 360 days  ?  AST  ?Date Value Ref Range Status  ?07/29/2021 28 15 - 41 U/L Final  ?   ?  ?  Passed - ALT in normal range and within 360 days  ?  ALT  ?Date Value Ref Range Status  ?07/29/2021 42 0 - 44 U/L Final  ?   ?  ?  Passed - eGFR is 30 or above and within 360 days  ?  GFR, Estimated  ?Date Value Ref Range Status  ?07/29/2021 >60 >60 mL/min Final  ?  Comment:  ?  (NOTE) ?Calculated using the CKD-EPI Creatinine Equation (2021) ?  ? ?eGFR  ?Date Value Ref Range Status  ?07/01/2021 71 > OR = 60 mL/min/1.98m Final  ?  Comment:  ?  The eGFR is based on the CKD-EPI 2021 equation. To calculate  ?the new eGFR from a previous Creatinine or Cystatin C ?result, go to https://www.kidney.org/professionals/ ?kdoqi/gfr%5Fcalculator ?  ?   ?  ?  Passed - Patient is not pregnant  ?  ?  Passed - Valid encounter within last 12 months  ?  Recent Outpatient Visits   ?      ? 3 months ago Chronic pain of both knees  ? [place] DO  ? 8 months ago  Intestinal infection due to enteropathogenic E. coli  ? [facility]  ? 8 months ago Annual physical exam  ? [place] DO  ? 11 months ago Hematospermia  ? [place] DO  ? 1 year ago Type 2 diabetes mellitus with other specified complication, with long-term current use of insulin ([place]  ? [place]  ?  ?  ?Future Appointments   ?        ? [facility] ? In 12 months KRalene Bathe MD [place] ?  ? ?  ?  ?  ? ?

## 2022-04-04 DIAGNOSIS — G4733 Obstructive sleep apnea (adult) (pediatric): Secondary | ICD-10-CM | POA: Diagnosis not present

## 2022-04-05 DIAGNOSIS — Z794 Long term (current) use of insulin: Secondary | ICD-10-CM

## 2022-04-05 DIAGNOSIS — E1169 Type 2 diabetes mellitus with other specified complication: Secondary | ICD-10-CM

## 2022-04-05 DIAGNOSIS — Z7984 Long term (current) use of oral hypoglycemic drugs: Secondary | ICD-10-CM

## 2022-04-05 DIAGNOSIS — E785 Hyperlipidemia, unspecified: Secondary | ICD-10-CM

## 2022-04-06 DIAGNOSIS — M1712 Unilateral primary osteoarthritis, left knee: Secondary | ICD-10-CM | POA: Diagnosis not present

## 2022-04-10 ENCOUNTER — Encounter
Admission: RE | Admit: 2022-04-10 | Discharge: 2022-04-10 | Disposition: A | Discharge status: Home / Self Care | Payer: Medicare Other | Source: Ambulatory Visit | Attending: Orthopedic Surgery | Admitting: Orthopedic Surgery

## 2022-04-10 VITALS — BP 133/76 | HR 74 | Resp 16 | Ht 70.0 in | Wt 305.8 lb

## 2022-04-10 DIAGNOSIS — Z01818 Encounter for other preprocedural examination: Secondary | ICD-10-CM | POA: Insufficient documentation

## 2022-04-10 DIAGNOSIS — Z01810 Encounter for preprocedural cardiovascular examination: Secondary | ICD-10-CM | POA: Diagnosis not present

## 2022-04-10 DIAGNOSIS — Z01812 Encounter for preprocedural laboratory examination: Secondary | ICD-10-CM

## 2022-04-10 HISTORY — DX: Myoneural disorder, unspecified: G70.9

## 2022-04-10 HISTORY — DX: Personal history of urinary calculi: Z87.442

## 2022-04-10 HISTORY — DX: Gastro-esophageal reflux disease without esophagitis: K21.9

## 2022-04-10 HISTORY — DX: Anemia, unspecified: D64.9

## 2022-04-10 LAB — COMPREHENSIVE METABOLIC PANEL
ALT: 51 U/L — ABNORMAL HIGH (ref 0–44)
AST: 39 U/L (ref 15–41)
Albumin: 4 g/dL (ref 3.5–5.0)
Alkaline Phosphatase: 32 U/L — ABNORMAL LOW (ref 38–126)
Anion gap: 9 (ref 5–15)
BUN: 31 mg/dL — ABNORMAL HIGH (ref 8–23)
CO2: 26 mmol/L (ref 22–32)
Calcium: 10 mg/dL (ref 8.9–10.3)
Chloride: 102 mmol/L (ref 98–111)
Creatinine, Ser: 0.98 mg/dL (ref 0.61–1.24)
GFR, Estimated: >60 mL/min (ref >60)
Glucose, Bld: 125 mg/dL — ABNORMAL HIGH (ref 70–99)
Potassium: 3.7 mmol/L (ref 3.5–5.1)
Sodium: 137 mmol/L (ref 135–145)
Total Bilirubin: 0.7 mg/dL (ref 0.3–1.2)
Total Protein: 6.8 g/dL (ref 6.5–8.1)

## 2022-04-10 LAB — URINALYSIS, ROUTINE W REFLEX MICROSCOPIC
Bacteria, UA: NONE SEEN
Bilirubin Urine: NEGATIVE
Glucose, UA: >=500 mg/dL — AB
Hgb urine dipstick: NEGATIVE
Ketones, ur: NEGATIVE mg/dL
Leukocytes,Ua: NEGATIVE
Nitrite: NEGATIVE
Protein, ur: NEGATIVE mg/dL
Specific Gravity, Urine: 1.029 (ref 1.005–1.030)
pH: 6 (ref 5.0–8.0)

## 2022-04-10 LAB — CBC WITH DIFFERENTIAL/PLATELET
Abs Immature Granulocytes: 0.02 10*3/uL (ref 0.00–0.07)
Basophils Absolute: 0.1 10*3/uL (ref 0.0–0.1)
Basophils Relative: 2 %
Eosinophils Absolute: 0.4 10*3/uL (ref 0.0–0.5)
Eosinophils Relative: 7 %
HCT: 47.9 % (ref 39.0–52.0)
Hemoglobin: 16.1 g/dL (ref 13.0–17.0)
Immature Granulocytes: 0 %
Lymphocytes Relative: 29 %
Lymphs Abs: 1.9 10*3/uL (ref 0.7–4.0)
MCH: 28.1 pg (ref 26.0–34.0)
MCHC: 33.6 g/dL (ref 30.0–36.0)
MCV: 83.7 fL (ref 80.0–100.0)
Monocytes Absolute: 0.6 10*3/uL (ref 0.1–1.0)
Monocytes Relative: 9 %
Neutro Abs: 3.5 10*3/uL (ref 1.7–7.7)
Neutrophils Relative %: 53 %
Platelets: 160 10*3/uL (ref 150–400)
RBC: 5.72 MIL/uL (ref 4.22–5.81)
RDW: 13.8 % (ref 11.5–15.5)
WBC: 6.4 10*3/uL (ref 4.0–10.5)
nRBC: 0 % (ref 0.0–0.2)

## 2022-04-10 LAB — TYPE AND SCREEN
ABO/RH(D): A POS
Antibody Screen: NEGATIVE

## 2022-04-10 LAB — SURGICAL PCR SCREEN
MRSA, PCR: NEGATIVE
Staphylococcus aureus: NEGATIVE

## 2022-04-10 NOTE — Patient Instructions (Addendum)
Your procedure is scheduled on: 04/21/22 - Tuesday ?Report to the Registration Desk on the 1st floor of the [facility]. ?To find out your arrival time, please call [phone] between 1PM - 3PM on: 04/20/22 - Monday ?If your arrival time is 6:00 am, do not arrive prior to that time as the [facility] entrance doors do not open until 6:00 am. ? ?REMEMBER: ?Instructions that are not followed completely may result in serious medical risk, up to and including death; or upon the discretion of your surgeon and anesthesiologist your surgery may need to be rescheduled. ? ?Do not eat food after midnight the night before surgery.  ?No gum chewing, lozengers or hard candies. ? ?You may however, drink CLEAR liquids up to 2 hours before you are scheduled to arrive for your surgery. Do not drink anything within 2 hours of your scheduled arrival time. ? ?Clear liquids include: ?- water  ?Type 1 and Type 2 diabetics should only drink water. ? ?In addition, your doctor has ordered for you to drink the provided  ?Gatorade G2 ?Drinking this carbohydrate drink up to two hours before surgery helps to reduce insulin resistance and improve patient outcomes. Please complete drinking 2 hours prior to scheduled arrival time. ? ?TAKE ONLY THESE MEDICATIONS THE MORNING OF SURGERY WITH A SIP OF WATER: ? ?- busPIRone (BUSPAR) 7.5 MG tablet ?- celecoxib (CELEBREX) 200 MG capsule ?- gabapentin (NEURONTIN) 600 MG tablet ?- venlafaxine XR (EFFEXOR-XR) 150 MG 24 hr capsule ?- omeprazole (PRILOSEC) 20 MG capsule, (take one the night before and one on the morning of surgery - helps to prevent nausea after surgery.) ? ?- insulin degludec (TRESIBA FLEXTOUCH) 100 UNIT/ML FlexTouch Pen ? ?- Stop taking metFORMIN (GLUCOPHAGE-XR) 500 MG 24 hr tablet beginning 04/19/22, may resume taking the day after your surgery. ? ?Stop taking FARXIGA 10 MG TABS tablet beginning 04/18/22, may resume the day after your surgery. ? ?One week prior to surgery: ?Stop  Anti-inflammatories (NSAIDS) such as Advil, Aleve, Ibuprofen, Motrin, Naproxen, Naprosyn and Aspirin based products such as Excedrin, Goodys Powder, BC Powder. ? ?Stop beginning 04/13/22 ANY OVER THE COUNTER supplements until after surgery.Ascorbic Acid (VITAMIN C) 1000 MG tablet,cholecalciferol (VITAMIN D3) 25 MCG (1000 UNIT) tablet,Omega-3 Fatty Acids (FISH OIL) 1200 MG CAPS, ? ?You may however, continue to take Tylenol if needed for pain up until the day of surgery. ? ?No Alcohol for 24 hours before or after surgery. ? ?No Smoking including e-cigarettes for 24 hours prior to surgery.  ?No chewable tobacco products for at least 6 hours prior to surgery.  ?No nicotine patches on the day of surgery. ? ?Do not use any "recreational" drugs for at least a week prior to your surgery.  ?Please be advised that the combination of cocaine and anesthesia may have negative outcomes, up to and including death. ?If you test positive for cocaine, your surgery will be cancelled. ? ?On the morning of surgery brush your teeth with toothpaste and water, you may rinse your mouth with mouthwash if you wish. ?Do not swallow any toothpaste or mouthwash. ? ?Use CHG Soap or wipes as directed on instruction sheet. ? ?Do not wear jewelry, make-up, hairpins, clips or nail polish. ? ?Do not wear lotions, powders, or perfumes.  ? ?Do not shave body from the neck down 48 hours prior to surgery just in case you cut yourself which could leave a site for infection.  ?Also, freshly shaved skin may become irritated if using the CHG soap. ? ?Contact  lenses, hearing aids and dentures may not be worn into surgery. ? ?Do not bring valuables to the hospital. [facility] is not responsible for any missing/lost belongings or valuables.  ? ?Bring your C-PAP to the hospital with you in case you may have to spend the night.  ? ?Notify your doctor if there is any change in your medical condition (cold, fever, infection). ? ?Wear comfortable clothing (specific  to your surgery type) to the hospital. ? ?After surgery, you can help prevent lung complications by doing breathing exercises.  ?Take deep breaths and cough every 1-2 hours. Your doctor may order a device called an Incentive Spirometer to help you take deep breaths. ?When coughing or sneezing, hold a pillow firmly against your incision with both hands. This is called ?splinting.? Doing this helps protect your incision. It also decreases belly discomfort. ? ?If you are being admitted to the hospital overnight, leave your suitcase in the car. ?After surgery it may be brought to your room. ? ?If you are being discharged the day of surgery, you will not be allowed to drive home. ?You will need a responsible adult (18 years or older) to drive you home and stay with you that night.  ? ?If you are taking public transportation, you will need to have a responsible adult (18 years or older) with you. ?Please confirm with your physician that it is acceptable to use public transportation.  ? ?Please call the [employer] [facility] at [phone] if you have any questions about these instructions. ? ?Surgery Visitation Policy: ? ?Patients undergoing a surgery or procedure may have two family members or support persons with them as long as the person is not COVID-19 positive or experiencing its symptoms.  ? ?Inpatient Visitation:   ? ?Visiting hours are 7 a.m. to 8 p.m. ?Up to four visitors are allowed at one time in a patient room, including children. The visitors may rotate out with other people during the day. One designated support person (adult) may remain overnight.  ?

## 2022-04-15 DIAGNOSIS — L03116 Cellulitis of left lower limb: Secondary | ICD-10-CM | POA: Diagnosis not present

## 2022-04-15 DIAGNOSIS — M1712 Unilateral primary osteoarthritis, left knee: Secondary | ICD-10-CM | POA: Diagnosis not present

## 2022-04-21 ENCOUNTER — Encounter: Payer: Self-pay | Admitting: Orthopedic Surgery

## 2022-04-21 ENCOUNTER — Encounter
Admission: RE | Disposition: A | Discharge status: Home Health | Payer: Self-pay | Source: Home / Self Care | Attending: Orthopedic Surgery

## 2022-04-21 ENCOUNTER — Observation Stay: Payer: Medicare Other

## 2022-04-21 ENCOUNTER — Ambulatory Visit: Payer: Medicare Other | Admitting: Anesthesiology

## 2022-04-21 ENCOUNTER — Other Ambulatory Visit: Payer: Self-pay

## 2022-04-21 ENCOUNTER — Ambulatory Visit: Payer: Medicare Other | Admitting: Urgent Care

## 2022-04-21 ENCOUNTER — Inpatient Hospital Stay
Admission: RE | Admit: 2022-04-21 | Discharge: 2022-04-24 | DRG: 470 | Disposition: A | Discharge status: Home Health | Payer: Medicare Other | Attending: Orthopedic Surgery | Admitting: Orthopedic Surgery

## 2022-04-21 DIAGNOSIS — Z96652 Presence of left artificial knee joint: Secondary | ICD-10-CM

## 2022-04-21 DIAGNOSIS — G4730 Sleep apnea, unspecified: Secondary | ICD-10-CM | POA: Diagnosis present

## 2022-04-21 DIAGNOSIS — Z419 Encounter for procedure for purposes other than remedying health state, unspecified: Principal | ICD-10-CM

## 2022-04-21 DIAGNOSIS — Z7984 Long term (current) use of oral hypoglycemic drugs: Secondary | ICD-10-CM | POA: Diagnosis not present

## 2022-04-21 DIAGNOSIS — M170 Bilateral primary osteoarthritis of knee: Secondary | ICD-10-CM | POA: Diagnosis not present

## 2022-04-21 DIAGNOSIS — Z6841 Body Mass Index (BMI) 40.0 and over, adult: Secondary | ICD-10-CM

## 2022-04-21 DIAGNOSIS — Z794 Long term (current) use of insulin: Secondary | ICD-10-CM

## 2022-04-21 DIAGNOSIS — E1140 Type 2 diabetes mellitus with diabetic neuropathy, unspecified: Secondary | ICD-10-CM | POA: Diagnosis not present

## 2022-04-21 DIAGNOSIS — I10 Essential (primary) hypertension: Secondary | ICD-10-CM | POA: Diagnosis present

## 2022-04-21 DIAGNOSIS — N400 Enlarged prostate without lower urinary tract symptoms: Secondary | ICD-10-CM | POA: Diagnosis present

## 2022-04-21 DIAGNOSIS — Z79899 Other long term (current) drug therapy: Secondary | ICD-10-CM

## 2022-04-21 DIAGNOSIS — F988 Other specified behavioral and emotional disorders with onset usually occurring in childhood and adolescence: Secondary | ICD-10-CM | POA: Diagnosis present

## 2022-04-21 DIAGNOSIS — F419 Anxiety disorder, unspecified: Secondary | ICD-10-CM | POA: Diagnosis present

## 2022-04-21 DIAGNOSIS — Z87442 Personal history of urinary calculi: Secondary | ICD-10-CM | POA: Diagnosis not present

## 2022-04-21 DIAGNOSIS — K219 Gastro-esophageal reflux disease without esophagitis: Secondary | ICD-10-CM | POA: Diagnosis not present

## 2022-04-21 DIAGNOSIS — F32A Depression, unspecified: Secondary | ICD-10-CM | POA: Diagnosis not present

## 2022-04-21 DIAGNOSIS — Z7982 Long term (current) use of aspirin: Secondary | ICD-10-CM

## 2022-04-21 DIAGNOSIS — Z791 Long term (current) use of non-steroidal anti-inflammatories (NSAID): Secondary | ICD-10-CM

## 2022-04-21 DIAGNOSIS — M1712 Unilateral primary osteoarthritis, left knee: Secondary | ICD-10-CM | POA: Diagnosis not present

## 2022-04-21 HISTORY — PX: TOTAL KNEE ARTHROPLASTY: SHX125

## 2022-04-21 LAB — CBC
HCT: 45.4 % (ref 39.0–52.0)
Hemoglobin: 14.6 g/dL (ref 13.0–17.0)
MCH: 27.9 pg (ref 26.0–34.0)
MCHC: 32.2 g/dL (ref 30.0–36.0)
MCV: 86.6 fL (ref 80.0–100.0)
Platelets: 152 10*3/uL (ref 150–400)
RBC: 5.24 MIL/uL (ref 4.22–5.81)
RDW: 13.9 % (ref 11.5–15.5)
WBC: 7.2 10*3/uL (ref 4.0–10.5)
nRBC: 0 % (ref 0.0–0.2)

## 2022-04-21 LAB — GLUCOSE, CAPILLARY
Glucose-Capillary: 156 mg/dL — ABNORMAL HIGH (ref 70–99)
Glucose-Capillary: 159 mg/dL — ABNORMAL HIGH (ref 70–99)
Glucose-Capillary: 174 mg/dL — ABNORMAL HIGH (ref 70–99)
Glucose-Capillary: 180 mg/dL — ABNORMAL HIGH (ref 70–99)
Glucose-Capillary: 207 mg/dL — ABNORMAL HIGH (ref 70–99)

## 2022-04-21 LAB — CREATININE, SERUM
Creatinine, Ser: 1.12 mg/dL (ref 0.61–1.24)
GFR, Estimated: >60 mL/min (ref >60)

## 2022-04-21 LAB — ABO/RH: ABO/RH(D): A POS

## 2022-04-21 SURGERY — ARTHROPLASTY, KNEE, TOTAL
Anesthesia: Spinal | Site: Knee | Laterality: Left

## 2022-04-21 MED ORDER — HYDROCODONE-ACETAMINOPHEN 5-325 MG PO TABS: 1.0000 | ORAL_TABLET | ORAL | Status: DC | PRN

## 2022-04-21 MED ORDER — MORPHINE SULFATE (PF) 10 MG/ML IV SOLN
INTRAVENOUS | Status: AC
  Filled 2022-04-21: qty 1

## 2022-04-21 MED ORDER — DOCUSATE SODIUM 100 MG PO CAPS
100.0000 mg | ORAL_CAPSULE | Freq: Two times a day (BID) | ORAL | Status: DC
  Administered 2022-04-21 – 2022-04-24 (×7): 100 mg via ORAL
  Filled 2022-04-21 (×7): qty 1

## 2022-04-21 MED ORDER — BUSPIRONE HCL 15 MG PO TABS
7.5000 mg | ORAL_TABLET | Freq: Two times a day (BID) | ORAL | Status: DC
  Administered 2022-04-21 – 2022-04-24 (×6): 7.5 mg via ORAL
  Filled 2022-04-21 (×6): qty 1

## 2022-04-21 MED ORDER — HYDROCODONE-ACETAMINOPHEN 7.5-325 MG PO TABS
1.0000 | ORAL_TABLET | ORAL | Status: DC | PRN
  Administered 2022-04-21: 1 via ORAL
  Filled 2022-04-21: qty 2
  Filled 2022-04-21: qty 1

## 2022-04-21 MED ORDER — BUPIVACAINE LIPOSOME 1.3 % IJ SUSP
INTRAMUSCULAR | Status: AC
  Filled 2022-04-21: qty 40

## 2022-04-21 MED ORDER — FLEET ENEMA 7-19 GM/118ML RE ENEM: 1.0000 | ENEMA | Freq: Once | RECTAL | Status: DC | PRN

## 2022-04-21 MED ORDER — MAGNESIUM HYDROXIDE 400 MG/5ML PO SUSP
30.0000 mL | Freq: Every day | ORAL | Status: DC
  Administered 2022-04-21 – 2022-04-23 (×3): 30 mL via ORAL
  Filled 2022-04-21 (×3): qty 30

## 2022-04-21 MED ORDER — PROPOFOL 500 MG/50ML IV EMUL
INTRAVENOUS | Status: DC | PRN
  Administered 2022-04-21: 50 ug/kg/min via INTRAVENOUS

## 2022-04-21 MED ORDER — ZOLPIDEM TARTRATE 5 MG PO TABS: 5.0000 mg | ORAL_TABLET | Freq: Every evening | ORAL | Status: DC | PRN

## 2022-04-21 MED ORDER — ENOXAPARIN SODIUM 30 MG/0.3ML IJ SOSY
30.0000 mg | PREFILLED_SYRINGE | Freq: Two times a day (BID) | INTRAMUSCULAR | Status: DC
  Administered 2022-04-22 – 2022-04-24 (×5): 30 mg via SUBCUTANEOUS
  Filled 2022-04-21 (×5): qty 0.3

## 2022-04-21 MED ORDER — METHOCARBAMOL 500 MG PO TABS
500.0000 mg | ORAL_TABLET | Freq: Four times a day (QID) | ORAL | 0 refills | Status: DC | PRN
Start: 2022-04-21 — End: 2022-07-27

## 2022-04-21 MED ORDER — TRAMADOL HCL 50 MG PO TABS: 50.0000 mg | ORAL_TABLET | Freq: Four times a day (QID) | ORAL | 0 refills | Status: DC | PRN

## 2022-04-21 MED ORDER — DAPAGLIFLOZIN PROPANEDIOL 10 MG PO TABS
10.0000 mg | ORAL_TABLET | Freq: Every day | ORAL | Status: DC
  Administered 2022-04-22 – 2022-04-24 (×3): 10 mg via ORAL
  Filled 2022-04-21 (×3): qty 1

## 2022-04-21 MED ORDER — METOCLOPRAMIDE HCL 5 MG/ML IJ SOLN: 5.0000 mg | Freq: Three times a day (TID) | INTRAMUSCULAR | Status: DC | PRN

## 2022-04-21 MED ORDER — TRAMADOL HCL 50 MG PO TABS
50.0000 mg | ORAL_TABLET | Freq: Four times a day (QID) | ORAL | Status: DC
  Administered 2022-04-21 – 2022-04-24 (×12): 50 mg via ORAL
  Filled 2022-04-21 (×12): qty 1

## 2022-04-21 MED ORDER — ORAL CARE MOUTH RINSE: 15.0000 mL | Freq: Once | OROMUCOSAL | Status: AC

## 2022-04-21 MED ORDER — MIDAZOLAM HCL 2 MG/2ML IJ SOLN
INTRAMUSCULAR | Status: AC
  Filled 2022-04-21: qty 2

## 2022-04-21 MED ORDER — PANTOPRAZOLE SODIUM 40 MG PO TBEC
40.0000 mg | DELAYED_RELEASE_TABLET | Freq: Every day | ORAL | Status: DC
Start: 2022-04-21 — End: 2022-04-24
  Administered 2022-04-21 – 2022-04-24 (×4): 40 mg via ORAL
  Filled 2022-04-21 (×4): qty 1

## 2022-04-21 MED ORDER — METOCLOPRAMIDE HCL 5 MG PO TABS: 5.0000 mg | ORAL_TABLET | Freq: Three times a day (TID) | ORAL | Status: DC | PRN

## 2022-04-21 MED ORDER — DOCUSATE SODIUM 100 MG PO CAPS: 100.0000 mg | ORAL_CAPSULE | Freq: Two times a day (BID) | ORAL | 0 refills | Status: DC

## 2022-04-21 MED ORDER — ADULT MULTIVITAMIN W/MINERALS CH
1.0000 | ORAL_TABLET | Freq: Every day | ORAL | Status: DC
  Administered 2022-04-21 – 2022-04-24 (×4): 1 via ORAL
  Filled 2022-04-21 (×4): qty 1

## 2022-04-21 MED ORDER — POLYETHYLENE GLYCOL 3350 17 G PO PACK: 17.0000 g | PACK | Freq: Every day | ORAL | 0 refills | Status: DC | PRN

## 2022-04-21 MED ORDER — HYDROCODONE-ACETAMINOPHEN 5-325 MG PO TABS: 1.0000 | ORAL_TABLET | ORAL | 0 refills | Status: DC | PRN

## 2022-04-21 MED ORDER — CEFAZOLIN IN SODIUM CHLORIDE 3-0.9 GM/100ML-% IV SOLN
3.0000 g | INTRAVENOUS | Status: AC
  Administered 2022-04-21: 3 g via INTRAVENOUS
  Filled 2022-04-21 (×2): qty 100

## 2022-04-21 MED ORDER — ASCORBIC ACID 500 MG PO TABS
1000.0000 mg | ORAL_TABLET | Freq: Every day | ORAL | Status: DC
  Administered 2022-04-21 – 2022-04-24 (×4): 1000 mg via ORAL
  Filled 2022-04-21 (×4): qty 2

## 2022-04-21 MED ORDER — PIOGLITAZONE HCL 15 MG PO TABS
15.0000 mg | ORAL_TABLET | Freq: Every day | ORAL | Status: DC
  Administered 2022-04-21 – 2022-04-24 (×4): 15 mg via ORAL
  Filled 2022-04-21 (×4): qty 1

## 2022-04-21 MED ORDER — ACETAMINOPHEN 10 MG/ML IV SOLN
INTRAVENOUS | Status: DC | PRN
  Administered 2022-04-21: 1000 mg via INTRAVENOUS

## 2022-04-21 MED ORDER — CHLORHEXIDINE GLUCONATE 0.12 % MT SOLN: 15.0000 mL | Freq: Once | OROMUCOSAL | Status: AC

## 2022-04-21 MED ORDER — CEFAZOLIN SODIUM-DEXTROSE 2-4 GM/100ML-% IV SOLN
2.0000 g | Freq: Four times a day (QID) | INTRAVENOUS | Status: AC
  Administered 2022-04-21 (×2): 2 g via INTRAVENOUS
  Filled 2022-04-21 (×2): qty 100

## 2022-04-21 MED ORDER — SODIUM CHLORIDE 0.9 % IR SOLN: Status: DC | PRN

## 2022-04-21 MED ORDER — INSULIN GLARGINE-YFGN 100 UNIT/ML ~~LOC~~ SOLN
66.0000 [IU] | Freq: Every day | SUBCUTANEOUS | Status: DC
  Administered 2022-04-21 – 2022-04-23 (×3): 66 [IU] via SUBCUTANEOUS
  Filled 2022-04-21 (×4): qty 0.66

## 2022-04-21 MED ORDER — FINASTERIDE 5 MG PO TABS
5.0000 mg | ORAL_TABLET | Freq: Every day | ORAL | Status: DC
  Administered 2022-04-21 – 2022-04-24 (×4): 5 mg via ORAL
  Filled 2022-04-21 (×4): qty 1

## 2022-04-21 MED ORDER — ACETAMINOPHEN 325 MG PO TABS: 325.0000 mg | ORAL_TABLET | Freq: Four times a day (QID) | ORAL | Status: DC | PRN

## 2022-04-21 MED ORDER — TRAZODONE HCL 100 MG PO TABS
100.0000 mg | ORAL_TABLET | Freq: Every day | ORAL | Status: DC
  Administered 2022-04-21 – 2022-04-23 (×3): 100 mg via ORAL
  Filled 2022-04-21 (×3): qty 1

## 2022-04-21 MED ORDER — BISACODYL 5 MG PO TBEC
5.0000 mg | DELAYED_RELEASE_TABLET | Freq: Every day | ORAL | Status: DC | PRN
Start: 2022-04-21 — End: 2022-04-24
  Administered 2022-04-23: 5 mg via ORAL
  Filled 2022-04-21: qty 1

## 2022-04-21 MED ORDER — VENLAFAXINE HCL ER 150 MG PO CP24
150.0000 mg | ORAL_CAPSULE | Freq: Every day | ORAL | Status: DC
  Administered 2022-04-22 – 2022-04-24 (×3): 150 mg via ORAL
  Filled 2022-04-21 (×3): qty 1

## 2022-04-21 MED ORDER — MIDAZOLAM HCL 5 MG/5ML IJ SOLN
INTRAMUSCULAR | Status: DC | PRN
  Administered 2022-04-21: 2 mg via INTRAVENOUS

## 2022-04-21 MED ORDER — ATORVASTATIN CALCIUM 20 MG PO TABS
20.0000 mg | ORAL_TABLET | Freq: Every day | ORAL | Status: DC
  Administered 2022-04-21 – 2022-04-23 (×3): 20 mg via ORAL
  Filled 2022-04-21 (×3): qty 1

## 2022-04-21 MED ORDER — SURGIPHOR WOUND IRRIGATION SYSTEM - OPTIME: TOPICAL | Status: DC | PRN

## 2022-04-21 MED ORDER — ENOXAPARIN SODIUM 40 MG/0.4ML IJ SOSY: 40.0000 mg | PREFILLED_SYRINGE | INTRAMUSCULAR | 0 refills | Status: DC

## 2022-04-21 MED ORDER — METHOCARBAMOL 1000 MG/10ML IJ SOLN
500.0000 mg | Freq: Four times a day (QID) | INTRAVENOUS | Status: DC | PRN
  Filled 2022-04-21: qty 5

## 2022-04-21 MED ORDER — PHENYLEPHRINE HCL (PRESSORS) 10 MG/ML IV SOLN
INTRAVENOUS | Status: DC | PRN
  Administered 2022-04-21 (×2): 160 ug via INTRAVENOUS

## 2022-04-21 MED ORDER — SODIUM CHLORIDE 0.9 % IV BOLUS
1000.0000 mL | Freq: Once | INTRAVENOUS | Status: AC
  Administered 2022-04-21: 1000 mL via INTRAVENOUS

## 2022-04-21 MED ORDER — FENOFIBRATE 145 MG PO TABS
145.0000 mg | ORAL_TABLET | Freq: Every day | ORAL | Status: DC
  Filled 2022-04-21 (×5): qty 1

## 2022-04-21 MED ORDER — SODIUM CHLORIDE 0.9 % IV SOLN
INTRAVENOUS | Status: DC
Start: 2022-04-21 — End: 2022-04-21

## 2022-04-21 MED ORDER — NEOMYCIN-POLYMYXIN B GU 40-200000 IR SOLN
Status: AC
  Filled 2022-04-21: qty 20

## 2022-04-21 MED ORDER — FENTANYL CITRATE (PF) 100 MCG/2ML IJ SOLN
INTRAMUSCULAR | Status: AC
  Filled 2022-04-21: qty 2

## 2022-04-21 MED ORDER — ACETAMINOPHEN 10 MG/ML IV SOLN
INTRAVENOUS | Status: AC
  Filled 2022-04-21: qty 100

## 2022-04-21 MED ORDER — PROPOFOL 10 MG/ML IV BOLUS
INTRAVENOUS | Status: AC
  Filled 2022-04-21: qty 20

## 2022-04-21 MED ORDER — METFORMIN HCL ER 500 MG PO TB24
500.0000 mg | ORAL_TABLET | Freq: Two times a day (BID) | ORAL | Status: DC
  Administered 2022-04-21 – 2022-04-24 (×6): 500 mg via ORAL
  Filled 2022-04-21 (×7): qty 1

## 2022-04-21 MED ORDER — PROPOFOL 10 MG/ML IV BOLUS
INTRAVENOUS | Status: DC | PRN
  Administered 2022-04-21: 50 mg via INTRAVENOUS

## 2022-04-21 MED ORDER — SODIUM CHLORIDE 0.9 % IV SOLN: INTRAVENOUS | Status: DC

## 2022-04-21 MED ORDER — METHOCARBAMOL 500 MG PO TABS: 500.0000 mg | ORAL_TABLET | Freq: Four times a day (QID) | ORAL | Status: DC | PRN

## 2022-04-21 MED ORDER — DIPHENHYDRAMINE HCL 12.5 MG/5ML PO ELIX: 12.5000 mg | ORAL_SOLUTION | ORAL | Status: DC | PRN

## 2022-04-21 MED ORDER — PHENOL 1.4 % MT LIQD
1.0000 | OROMUCOSAL | Status: DC | PRN
  Filled 2022-04-21: qty 177

## 2022-04-21 MED ORDER — BUPIVACAINE HCL (PF) 0.5 % IJ SOLN
INTRAMUSCULAR | Status: DC | PRN
Start: 2022-04-21 — End: 2022-04-21
  Administered 2022-04-21: 2.8 mL via INTRATHECAL

## 2022-04-21 MED ORDER — SODIUM CHLORIDE (PF) 0.9 % IJ SOLN
INTRAMUSCULAR | Status: DC | PRN
  Administered 2022-04-21: 91 mL via INTRAMUSCULAR

## 2022-04-21 MED ORDER — FENTANYL CITRATE (PF) 100 MCG/2ML IJ SOLN: 25.0000 ug | INTRAMUSCULAR | Status: DC | PRN

## 2022-04-21 MED ORDER — SODIUM CHLORIDE 0.9 % IV BOLUS
500.0000 mL | Freq: Once | INTRAVENOUS | Status: AC
  Administered 2022-04-21: 500 mL via INTRAVENOUS

## 2022-04-21 MED ORDER — RISAQUAD PO CAPS
1.0000 | ORAL_CAPSULE | Freq: Every day | ORAL | Status: DC
  Administered 2022-04-21 – 2022-04-24 (×4): 1 via ORAL
  Filled 2022-04-21 (×4): qty 1

## 2022-04-21 MED ORDER — MENTHOL 3 MG MT LOZG
1.0000 | LOZENGE | OROMUCOSAL | Status: DC | PRN
  Filled 2022-04-21: qty 9

## 2022-04-21 MED ORDER — VITAMIN D 25 MCG (1000 UNIT) PO TABS
1000.0000 [IU] | ORAL_TABLET | Freq: Every day | ORAL | Status: DC
  Administered 2022-04-21 – 2022-04-24 (×4): 1000 [IU] via ORAL
  Filled 2022-04-21 (×4): qty 1

## 2022-04-21 MED ORDER — CHLORTHALIDONE 25 MG PO TABS
25.0000 mg | ORAL_TABLET | Freq: Every day | ORAL | Status: DC
  Administered 2022-04-21: 25 mg via ORAL
  Filled 2022-04-21 (×3): qty 1

## 2022-04-21 MED ORDER — ASPIRIN EC 81 MG PO TBEC
81.0000 mg | DELAYED_RELEASE_TABLET | Freq: Every day | ORAL | Status: DC
  Administered 2022-04-21 – 2022-04-24 (×4): 81 mg via ORAL
  Filled 2022-04-21 (×4): qty 1

## 2022-04-21 MED ORDER — ONDANSETRON HCL 4 MG PO TABS: 4.0000 mg | ORAL_TABLET | Freq: Four times a day (QID) | ORAL | Status: DC | PRN

## 2022-04-21 MED ORDER — SODIUM CHLORIDE FLUSH 0.9 % IV SOLN
INTRAVENOUS | Status: AC
  Filled 2022-04-21: qty 80

## 2022-04-21 MED ORDER — ALUM & MAG HYDROXIDE-SIMETH 200-200-20 MG/5ML PO SUSP: 30.0000 mL | ORAL | Status: DC | PRN

## 2022-04-21 MED ORDER — PROPOFOL 1000 MG/100ML IV EMUL
INTRAVENOUS | Status: AC
  Filled 2022-04-21: qty 100

## 2022-04-21 MED ORDER — TAMSULOSIN HCL 0.4 MG PO CAPS
0.4000 mg | ORAL_CAPSULE | Freq: Every day | ORAL | Status: DC
  Administered 2022-04-21 – 2022-04-23 (×3): 0.4 mg via ORAL
  Filled 2022-04-21 (×3): qty 1

## 2022-04-21 MED ORDER — BUPIVACAINE-EPINEPHRINE (PF) 0.25% -1:200000 IJ SOLN
INTRAMUSCULAR | Status: AC
  Filled 2022-04-21: qty 60

## 2022-04-21 MED ORDER — POLYETHYLENE GLYCOL 3350 17 G PO PACK
17.0000 g | PACK | Freq: Every day | ORAL | Status: DC | PRN
  Administered 2022-04-22 – 2022-04-24 (×2): 17 g via ORAL
  Filled 2022-04-21 (×2): qty 1

## 2022-04-21 MED ORDER — INSULIN ASPART 100 UNIT/ML IJ SOLN
0.0000 [IU] | Freq: Three times a day (TID) | INTRAMUSCULAR | Status: DC
  Administered 2022-04-21 (×2): 3 [IU] via SUBCUTANEOUS
  Administered 2022-04-22: 5 [IU] via SUBCUTANEOUS
  Administered 2022-04-22 – 2022-04-23 (×5): 3 [IU] via SUBCUTANEOUS
  Administered 2022-04-24: 5 [IU] via SUBCUTANEOUS
  Administered 2022-04-24: 3 [IU] via SUBCUTANEOUS
  Filled 2022-04-21 (×10): qty 1

## 2022-04-21 MED ORDER — MORPHINE SULFATE (PF) 2 MG/ML IV SOLN
0.5000 mg | INTRAVENOUS | Status: DC | PRN
  Administered 2022-04-21: 1 mg via INTRAVENOUS
  Filled 2022-04-21: qty 1

## 2022-04-21 MED ORDER — LORATADINE 10 MG PO TABS
10.0000 mg | ORAL_TABLET | Freq: Every day | ORAL | Status: DC
Start: 2022-04-21 — End: 2022-04-24
  Administered 2022-04-21 – 2022-04-24 (×4): 10 mg via ORAL
  Filled 2022-04-21 (×4): qty 1

## 2022-04-21 MED ORDER — ONDANSETRON HCL 4 MG/2ML IJ SOLN: 4.0000 mg | Freq: Four times a day (QID) | INTRAMUSCULAR | Status: DC | PRN

## 2022-04-21 MED ORDER — LOSARTAN POTASSIUM 50 MG PO TABS: 50.0000 mg | ORAL_TABLET | Freq: Every day | ORAL | Status: DC

## 2022-04-21 MED ORDER — GABAPENTIN 600 MG PO TABS
600.0000 mg | ORAL_TABLET | Freq: Four times a day (QID) | ORAL | Status: DC
  Administered 2022-04-21 – 2022-04-24 (×12): 600 mg via ORAL
  Filled 2022-04-21 (×12): qty 1

## 2022-04-21 MED ORDER — CHLORHEXIDINE GLUCONATE 0.12 % MT SOLN
OROMUCOSAL | Status: AC
  Administered 2022-04-21: 15 mL via OROMUCOSAL
  Filled 2022-04-21: qty 15

## 2022-04-21 SURGICAL SUPPLY — 76 items
BLADE SAGITTAL 25.0X1.19X90 (BLADE) ×2 IMPLANT
BLADE SAW 90X13X1.19 OSCILLAT (BLADE) ×2 IMPLANT
BLOCK CUTTING TIBIAL 4 LT CT (MISCELLANEOUS) ×1 IMPLANT
BLOCK CUTTING TIBIAL 4 MED (MISCELLANEOUS) ×1 IMPLANT
BNDG ELASTIC 6X5.8 VLCR STR LF (GAUZE/BANDAGES/DRESSINGS) ×2 IMPLANT
CANISTER WOUND CARE 500ML ATS (WOUND CARE) ×2 IMPLANT
CEMENT HV SMART SET (Cement) ×4 IMPLANT
CHLORAPREP W/TINT 26 (MISCELLANEOUS) ×4 IMPLANT
COOLER POLAR GLACIER W/PUMP (MISCELLANEOUS) ×2 IMPLANT
CUFF TOURN SGL QUICK 24 (TOURNIQUET CUFF)
CUFF TOURN SGL QUICK 34 (TOURNIQUET CUFF)
CUFF TRNQT CYL 24X4X16.5-23 (TOURNIQUET CUFF) IMPLANT
CUFF TRNQT CYL 34X4.125X (TOURNIQUET CUFF) IMPLANT
DRAPE 3/4 80X56 (DRAPES) ×4 IMPLANT
DRSG MEPILEX SACRM 8.7X9.8 (GAUZE/BANDAGES/DRESSINGS) ×2 IMPLANT
ELECT CAUTERY BLADE 6.4 (BLADE) ×2 IMPLANT
ELECT REM PT RETURN 9FT ADLT (ELECTROSURGICAL) ×2
ELECTRODE REM PT RTRN 9FT ADLT (ELECTROSURGICAL) ×1 IMPLANT
FEM COMP 5 LT CEMENT 02120005L (Femur) ×1 IMPLANT
FEMUR BONE MODEL 4.9010 MEDACT (MISCELLANEOUS) ×1 IMPLANT
GAUZE SPONGE 4X4 12PLY STRL (GAUZE/BANDAGES/DRESSINGS) ×1 IMPLANT
GAUZE XEROFORM 1X8 LF (GAUZE/BANDAGES/DRESSINGS) ×1 IMPLANT
GLOVE SURG ORTHO 8.0 STRL STRW (GLOVE) ×2 IMPLANT
GLOVE SURG SYN 9.0  PF PI (GLOVE) ×1
GLOVE SURG SYN 9.0 PF PI (GLOVE) ×1 IMPLANT
GLOVE SURG UNDER LTX SZ8 (GLOVE) ×2 IMPLANT
GLOVE SURG UNDER POLY LF SZ9 (GLOVE) ×2 IMPLANT
GOWN SRG 2XL LVL 4 RGLN SLV (GOWNS) ×1 IMPLANT
GOWN STRL NON-REIN 2XL LVL4 (GOWNS) ×1
GOWN STRL REUS W/ TWL LRG LVL3 (GOWN DISPOSABLE) ×1 IMPLANT
GOWN STRL REUS W/ TWL XL LVL3 (GOWN DISPOSABLE) ×1 IMPLANT
GOWN STRL REUS W/TWL LRG LVL3 (GOWN DISPOSABLE) ×1
GOWN STRL REUS W/TWL XL LVL3 (GOWN DISPOSABLE) ×1
HOLDER FOLEY CATH W/STRAP (MISCELLANEOUS) ×2 IMPLANT
HOOD PEEL AWAY FLYTE STAYCOOL (MISCELLANEOUS) ×4 IMPLANT
IV NS IRRIG 3000ML ARTHROMATIC (IV SOLUTION) ×2 IMPLANT
KIT PREVENA INCISION MGT20CM45 (CANNISTER) ×2 IMPLANT
KIT TURNOVER KIT A (KITS) ×2 IMPLANT
MANIFOLD NEPTUNE II (INSTRUMENTS) ×3 IMPLANT
NDL SAFETY ECLIPSE 18X1.5 (NEEDLE) ×1 IMPLANT
NDL SPNL 18GX3.5 QUINCKE PK (NEEDLE) ×1 IMPLANT
NDL SPNL 20GX3.5 QUINCKE YW (NEEDLE) ×1 IMPLANT
NEEDLE HYPO 18GX1.5 SHARP (NEEDLE) ×1
NEEDLE SPNL 18GX3.5 QUINCKE PK (NEEDLE) IMPLANT
NEEDLE SPNL 20GX3.5 QUINCKE YW (NEEDLE) ×2 IMPLANT
NS IRRIG 1000ML POUR BTL (IV SOLUTION) ×2 IMPLANT
PACK TOTAL KNEE (MISCELLANEOUS) ×2 IMPLANT
PAD WRAPON POLAR KNEE (MISCELLANEOUS) ×1 IMPLANT
PAD WRAPON POLOR MULTI XL (MISCELLANEOUS) IMPLANT
PATELLA RESURFACING MEDACTA SZ (Bone Implant) ×1 IMPLANT
PENCIL SMOKE EVACUATOR COATED (MISCELLANEOUS) ×2 IMPLANT
PULSAVAC PLUS IRRIG FAN TIP (DISPOSABLE) ×2
SCALPEL PROTECTED #10 DISP (BLADE) ×4 IMPLANT
SOLUTION IRRIG SURGIPHOR (IV SOLUTION) ×1 IMPLANT
SOLUTION PRONTOSAN WOUND 350ML (IRRIGATION / IRRIGATOR) ×1 IMPLANT
STAPLER SKIN PROX 35W (STAPLE) ×2 IMPLANT
STEM EXTENSION 11MMX30MM (Stem) ×1 IMPLANT
SUCTION FRAZIER HANDLE 10FR (MISCELLANEOUS) ×1
SUCTION TUBE FRAZIER 10FR DISP (MISCELLANEOUS) ×1 IMPLANT
SUT DVC 2 QUILL PDO  T11 36X36 (SUTURE) ×1
SUT DVC 2 QUILL PDO T11 36X36 (SUTURE) ×1 IMPLANT
SUT ETHIBOND 2 V 37 (SUTURE) ×1 IMPLANT
SUT V-LOC 90 ABS DVC 3-0 CL (SUTURE) ×2 IMPLANT
SYR 20ML LL LF (SYRINGE) ×2 IMPLANT
SYR 50ML LL SCALE MARK (SYRINGE) ×4 IMPLANT
TIBIAL BONE MODEL LEFT (MISCELLANEOUS) ×1 IMPLANT
TIBIAL INSERT SZ4 LT 02120410F (Insert) ×1 IMPLANT
TIBIAL TRAY FIXED MEDACTA 0207 (Bone Implant) ×1 IMPLANT
TIP FAN IRRIG PULSAVAC PLUS (DISPOSABLE) ×1 IMPLANT
TOWEL OR 17X26 4PK STRL BLUE (TOWEL DISPOSABLE) ×1 IMPLANT
TOWER CARTRIDGE SMART MIX (DISPOSABLE) ×2 IMPLANT
TRAY FOLEY MTR SLVR 16FR STAT (SET/KITS/TRAYS/PACK) ×2 IMPLANT
WATER STERILE IRR 1000ML POUR (IV SOLUTION) ×2 IMPLANT
WRAP-ON POLOR PAD MULTI XL (MISCELLANEOUS) ×1
WRAPON POLAR PAD KNEE (MISCELLANEOUS)
WRAPON POLOR PAD MULTI XL (MISCELLANEOUS) ×1

## 2022-04-21 NOTE — Progress Notes (Signed)
OT Cancellation Note ? ?Patient Details ?Name: Eric Andrews ?MRN: [id number] ?DOB: January 11, 1951 ? ? ?Cancelled Treatment:    Reason Eval/Treat Not Completed: Other (comment). Pt finishing up with PT, BP low. Will hold OT and re-attempt next date for evaluation.  ? ?Ardeth Perfect., MPH, MS, OTR/L ?ascom [phone] ?04/21/22, 2:47 PM ? ?

## 2022-04-21 NOTE — Progress Notes (Signed)
Nurse notified by PT that patient was feeling dizzy, and they checked BP 87/53.  Nurse went to room and rechecked BP, was 85/45 HR 55.  Patient A&Ox4.  Eric Andrews, [place] contacted via phone and notified.  He ordered 1,048m bolus NS, and instructed to hold off on opiates and BP medications until patients BP comes back up.  Bolus started and will recheck BP. ?

## 2022-04-21 NOTE — Anesthesia Procedure Notes (Signed)
Spinal ? ?Patient location during procedure: OR ?Start time: 04/21/2022 7:23 AM ?End time: 04/21/2022 7:56 AM ?Reason for block: surgical anesthesia ?Staffing ?Performed: anesthesiologist and resident/CRNA  ?Anesthesiologist: Martha Clan, MD ?Resident/CRNA: Aline Brochure, CRNA ?Preanesthetic Checklist ?Completed: patient identified, IV checked, site marked, risks and benefits discussed, surgical consent, monitors and equipment checked, pre-op evaluation and timeout performed ?Spinal Block ?Patient position: sitting ?Prep: Betadine ?Patient monitoring: heart rate, continuous pulse ox, blood pressure and cardiac monitor ?Approach: midline ?Location: L3-4 ?Injection technique: single-shot ?Needle ?Needle type: Pencan  ?Needle gauge: 22 G ?Needle length: 9 cm ?Assessment ?Events: CSF return ?Additional Notes ?Negative paresthesia. Negative blood return. Positive free-flowing CSF. Expiration date of kit checked and confirmed. Patient tolerated procedure well, without complications. ? ? ? ? ? ?

## 2022-04-21 NOTE — Evaluation (Signed)
Physical Therapy Evaluation ?Patient Details ?Name: Eric Andrews ?MRN: [id number] ?DOB: [birth year] ?Today's Date: 04/21/2022 ? ?History of Present Illness ? Pt is a 71 y.o. male s/p L TKA secondary to primary localized OA 5/16.  PMH includes B LE neuropathy, sleep apnea with CPAP, htn, anxiety, and DM.  ?Clinical Impression ? Prior to surgery, pt was independent with ambulation; lives with family on main level of home with ramp to enter.  Pt tolerated LE ex's in bed fairly well with assist for L LE as needed.  Prior to getting OOB, pt suddenly reporting feeling dizzy in bed.  Pt's BP taken and noted to be 87/53: nurse notified immediately who came to assess pt.  Deferred OOB mobility d/t pt symptomatic at rest with noted low BP.  Pt would benefit from skilled PT to address impairments and functional limitations s/p surgery (see below for any additional details).  Will attempt to progress pt with functional mobility next session as appropriate and update PT recommendations.  ?   ? ?Recommendations for follow up therapy are one component of a multi-disciplinary discharge planning process, led by the attending physician.  Recommendations may be updated based on patient status, additional functional criteria and insurance authorization. ? ?Follow Up Recommendations Other (comment) (TBD) ? ?  ?Assistance Recommended at Discharge  (TBD)  ?Patient can return home with the following ?   ? ?  ?Equipment Recommendations Rolling walker (2 wheels);BSC/3in1  ?Recommendations for Other Services ? OT consult  ?  ?Functional Status Assessment Patient has had a recent decline in their functional status and demonstrates the ability to make significant improvements in function in a reasonable and predictable amount of time.  ? ?  ?Precautions / Restrictions Precautions ?Precautions: Knee;Fall ?Precaution Booklet Issued: Yes (comment) ?Restrictions ?Weight Bearing Restrictions: Yes ?LLE Weight Bearing: Weight bearing as tolerated  ? ?   ? ?Mobility ? Bed Mobility ?  ?  ?  ?  ?  ?  ?  ?General bed mobility comments: Deferred d/t low BP and pt symptomatic ?  ? ?Transfers ?  ?  ?  ?  ?  ?  ?  ?  ?  ?General transfer comment: Deferred d/t low BP and pt symptomatic ?  ? ?Ambulation/Gait ?  ?  ?  ?  ?  ?  ?  ?General Gait Details: Deferred d/t low BP and pt symptomatic ? ?Stairs ?  ?  ?  ?  ?  ? ?Wheelchair Mobility ?  ? ?Modified Rankin (Stroke Patients Only) ?  ? ?  ? ?Balance   ?  ?  ?  ?  ?  ?  ?  ?  ?  ?  ?  ?  ?  ?  ?  ?  ?  ?  ?   ? ? ? ?Pertinent Vitals/Pain Pain Assessment ?Pain Assessment: 0-10 ?Pain Score: 4  ?Pain Location: L knee ?Pain Descriptors / Indicators: Aching, Tender, Sore ?Pain Intervention(s): Limited activity within patient's tolerance, Monitored during session, Premedicated before session, Repositioned, Other (comment) (polar care applied)  ? ? ?Home Living Family/patient expects to be discharged to:: Private residence ?Living Arrangements: Children (Pt's 2 daughters, 3 grandchildren, and 1 great grandchild; pt's wife very supportive (but do not live together for family reasons)) ?Available Help at Discharge: Family ?Type of Home: House ?Home Access: Ramped entrance ?  ?  ?  ?Home Layout: Two level;Able to live on main level with bedroom/bathroom ?Home Equipment: Toilet riser;Grab bars - toilet;Grab bars -  tub/shower;Shower seat - built in;Cane - single Barista (2 wheels);BSC/3in1;Shower seat;Other (comment) ?Additional Comments: Hand held shower head  ?  ?Prior Function Prior Level of Function : Independent/Modified Independent ?  ?  ?  ?  ?  ?  ?Mobility Comments: No recent falls. ?  ?  ? ? ?Hand Dominance  ?   ? ?  ?Extremity/Trunk Assessment  ? Upper Extremity Assessment ?Upper Extremity Assessment: Overall WFL for tasks assessed ?  ? ?Lower Extremity Assessment ?Lower Extremity Assessment: RLE deficits/detail;LLE deficits/detail (AROM R LE at least 3/5 AROM hip flexion, knee flexion/extension, and DF/PF) ?LLE  Deficits / Details: good L quad contraction; at least 3/5 hip flexion and ankle DF/PF AROM; able to perform L LE SLR with minimal assist ?LLE: Unable to fully assess due to pain ?  ? ?   ?Communication  ? Communication: No difficulties  ?Cognition Arousal/Alertness: Awake/alert ?Behavior During Therapy: [facility] for tasks assessed/performed ?Overall Cognitive Status: Within Functional Limits for tasks assessed ?  ?  ?  ?  ?  ?  ?  ?  ?  ?  ?  ?  ?  ?  ?  ?  ?  ?  ?  ? ?  ?General Comments General comments (skin integrity, edema, etc.): L LE wound vac in place.  Nursing cleared pt for participation in physical therapy.  Pt agreeable to PT session.  Pt's wife present during session. ? ?  ?Exercises Total Joint Exercises ?Ankle Circles/Pumps: AROM, Strengthening, Both, 10 reps, Supine ?Quad Sets: AROM, Strengthening, Left, 10 reps, Supine ?Heel Slides: AAROM, Strengthening, Left, 10 reps, Supine ?Hip ABduction/ADduction: AAROM, Strengthening, Left, 10 reps, Supine ?Straight Leg Raises: AAROM, Strengthening, Left, 10 reps, Supine ?Goniometric ROM: L knee AROM 0-60 degrees  ? ?Assessment/Plan  ?  ?PT Assessment Patient needs continued PT services  ?PT Problem List Decreased strength;Decreased range of motion;Decreased activity tolerance;Decreased balance;Decreased mobility;Decreased knowledge of use of DME;Decreased knowledge of precautions;Pain;Decreased skin integrity ? ?   ?  ?PT Treatment Interventions DME instruction;Gait training;Functional mobility training;Therapeutic activities;Therapeutic exercise;Balance training;Patient/family education   ? ?PT Goals (Current goals can be found in the Care Plan section)  ?Acute Rehab PT Goals ?Patient Stated Goal: to be able to walk ?PT Goal Formulation: With patient ?Time For Goal Achievement: 05/05/22 ?Potential to Achieve Goals: Good ? ?  ?Frequency BID ?  ? ? ?Co-evaluation   ?  ?  ?  ?  ? ? ?  ?AM-PAC PT "6 Clicks" Mobility  ?Outcome Measure Help needed turning from your back  to your side while in a flat bed without using bedrails?: A Little ?Help needed moving from lying on your back to sitting on the side of a flat bed without using bedrails?: A Lot ?Help needed moving to and from a bed to a chair (including a wheelchair)?: A Lot ?Help needed standing up from a chair using your arms (e.g., wheelchair or bedside chair)?: A Lot ?Help needed to walk in hospital room?: A Lot ?Help needed climbing 3-5 steps with a railing? : A Lot ?6 Click Score: 13 ? ?  ?End of Session   ?Activity Tolerance: Treatment limited secondary to medical complications (Comment) (low BP laying in bed) ?Patient left: in bed;with call bell/phone within reach;with bed alarm set;with nursing/sitter in room;with family/visitor present;with SCD's reapplied;Other (comment) (L heel in bone foam; R heel floating via pillow support; polar care in place) ?Nurse Communication: Mobility status;Precautions;Weight bearing status;Other (comment) (pt's low BP and pt symptomatic) ?PT Visit  Diagnosis: Other abnormalities of gait and mobility (R26.89);Muscle weakness (generalized) (M62.81);Pain ?Pain - Right/Left: Left ?Pain - part of body: Knee ?  ? ?Time: 8546-2703 ?PT Time Calculation (min) (ACUTE ONLY): 38 min ? ? ?Charges:   PT Evaluation ?$PT Eval Low Complexity: 1 Low ?PT Treatments ?$Therapeutic Exercise: 8-22 mins ?  ?   ? ?Leitha Bleak, PT ?04/21/22, 5:46 PM ? ? ?

## 2022-04-21 NOTE — Discharge Summary (Incomplete Revision)
?Physician Discharge Summary  ?Patient ID: ?Eric Andrews ?MRN: [id number] ?DOB/AGE: 70-08-1951 70 y.o. ? ?Admit date: 04/21/2022 ?Discharge date: 04/22/2022 ? ?Admission Diagnoses:  ?S/P TKR (total knee replacement) using cement, left [Z96.652] ? ? ?Discharge Diagnoses: ?Patient Active Problem List  ? Diagnosis Date Noted  ? S/P TKR (total knee replacement) using cement, left 04/21/2022  ? Bilateral primary osteoarthritis of knee 12/17/2021  ? Gastroesophageal reflux disease without esophagitis 07/08/2021  ? Environmental and seasonal allergies 02/27/2021  ? Hyperlipidemia associated with type 2 diabetes mellitus ([place]) 02/27/2021  ? Morbid obesity with BMI of 40.0-44.9, adult ([place]) 06/26/2020  ? Major depressive disorder, recurrent, moderate ([place]) 06/25/2020  ? GAD (generalized anxiety disorder) 06/25/2020  ? Type 2 diabetes mellitus with other specified complication ([place]) 78/29/5621  ? Chronic pain of both knees 05/27/2020  ? Primary osteoarthritis involving multiple joints 05/27/2020  ? Cataract associated with type 2 diabetes mellitus ([place]) 05/27/2020  ? History of torn meniscus of left knee 05/27/2020  ? DDD (degenerative disc disease), lumbar 05/27/2020  ? ? ?Past Medical History:  ?Diagnosis Date  ? ADD (attention deficit disorder)   ? Anemia   ? Anxiety   ? Arthritis   ? Depression   ? Diabetes mellitus without complication ([place])   ? Enlarged prostate   ? GERD (gastroesophageal reflux disease)   ? History of kidney stones   ? Hypertension   ? Knee pain   ? Neuromuscular disorder ([place])   ? lower legs- neuropathy  ? Sleep apnea   ? ?  ?Transfusion: none ?  ?Consultants (if any):  ? ?Discharged Condition: Improved ? ?Hospital Course: Eric Andrews is an 71 y.o. male who was admitted 04/21/2022 with a diagnosis of S/P TKR (total knee replacement) using cement, left and went to the operating room on 04/21/2022 and underwent the above named procedures.  ?  ?Surgeries: Procedure(s): ?TOTAL KNEE ARTHROPLASTY on  04/21/2022 ?Patient tolerated the surgery well. Taken to PACU where she was stabilized and then transferred to the orthopedic floor. ? ?Started on Lovenox 30 mg q 12 hrs. SCDs applied bilaterally at 80 mm. Heels elevated on bed with rolled towels. No evidence of DVT. Negative [place]oman. ?Physical therapy started on day #1 for gait training and transfer. OT started day #1 for ADL and assisted devices. ? ?Patient's foley was d/c on day #1. Patient's IV was d/c on day #1. ? ?On post op day #1 patient was stable and ready for discharge to home with HHPT. ? ? ? ?He was given perioperative antibiotics:  ?Anti-infectives (From admission, onward)  ? ? Start     Dose/Rate Route Frequency Ordered Stop  ? 04/21/22 1400  ceFAZolin (ANCEF) IVPB 2g/100 mL premix       ? 2 g ?200 mL/hr over 30 Minutes Intravenous Every 6 hours 04/21/22 1107 04/21/22 2153  ? 04/21/22 0600  ceFAZolin (ANCEF) IVPB 3g/100 mL premix       ? 3 g ?200 mL/hr over 30 Minutes Intravenous On call to O.R. 04/21/22 0121 04/21/22 0815  ? ?  ?. ? ?He was given sequential compression devices, early ambulation, and Lovenox TEDs for DVT prophylaxis. ? ?He benefited maximally from the hospital stay and there were no complications.   ? ?Recent vital signs:  ?Vitals:  ? 04/21/22 2112 04/21/22 2315  ?BP: 118/66   ?Pulse:  96  ?Resp:  16  ?Temp:  98.4 ?F (36.9 ?C)  ?SpO2:  98%  ? ? ?Recent laboratory studies:  ?Lab Results  ?Component  Value Date  ? HGB 14.6 04/21/2022  ? HGB 16.1 04/10/2022  ? HGB 16.2 07/29/2021  ? ?Lab Results  ?Component Value Date  ? WBC 7.2 04/21/2022  ? PLT 152 04/21/2022  ? ?No results found for: INR ?Lab Results  ?Component Value Date  ? NA 137 04/10/2022  ? K 3.7 04/10/2022  ? CL 102 04/10/2022  ? CO2 26 04/10/2022  ? BUN 31 (H) 04/10/2022  ? CREATININE 1.12 04/21/2022  ? GLUCOSE 125 (H) 04/10/2022  ? ? ?Discharge Medications:   ?Allergies as of 04/21/2022   ? ?   Reactions  ? Dust Mite Extract   ? ?  ? ?  ?Medication List  ?  ? ?STOP taking these  medications   ? ?celecoxib 200 MG capsule ?Commonly known as: CELEBREX ?  ?Fluocinolone Acetonide 0.01 % Oil ?Commonly known as: DermOtic ?  ?mupirocin ointment 2 % ?Commonly known as: BACTROBAN ?  ? ?  ? ?TAKE these medications   ? ?aspirin 81 MG EC tablet ?Take 81 mg by mouth daily. ?  ?atorvastatin 20 MG tablet ?Commonly known as: LIPITOR ?TAKE 1 TABLET BY MOUTH AT BEDTIME ?  ?busPIRone 7.5 MG tablet ?Commonly known as: BUSPAR ?Take 7.5 mg by mouth 2 (two) times daily. ?What changed: Another medication with the same name was removed. Continue taking this medication, and follow the directions you see here. ?  ?cetirizine 10 MG tablet ?Commonly known as: ZYRTEC ?TAKE 1 TABLET(10 MG) BY MOUTH AT BEDTIME ?  ?chlorthalidone 25 MG tablet ?Commonly known as: HYGROTON ?TAKE 1 TABLET(25 MG) BY MOUTH DAILY ?  ?cholecalciferol 25 MCG (1000 UNIT) tablet ?Commonly known as: VITAMIN D3 ?Take 1,000 Units by mouth daily. ?  ?docusate sodium 100 MG capsule ?Commonly known as: COLACE ?Take 1 capsule (100 mg total) by mouth 2 (two) times daily. ?Start taking on: Apr 22, 2022 ?  ?enoxaparin 40 MG/0.4ML injection ?Commonly known as: LOVENOX ?Inject 0.4 mLs (40 mg total) into the skin daily for 14 days. ?  ?Farxiga 10 MG Tabs tablet ?Generic drug: dapagliflozin propanediol ?Take 10 mg by mouth daily before breakfast. ?  ?fenofibrate micronized 134 MG capsule ?Commonly known as: LOFIBRA ?TAKE 1 CAPSULE(134 MG) BY MOUTH DAILY BEFORE BREAKFAST ?What changed: See the new instructions. ?  ?finasteride 5 MG tablet ?Commonly known as: PROSCAR ?TAKE 1 TABLET(5 MG) BY MOUTH EVERY EVENING ?  ?Fish Oil 1200 MG Caps ?Take 1,200 mg by mouth in the morning and at bedtime. ?  ?gabapentin 600 MG tablet ?Commonly known as: NEURONTIN ?Take 600 mg by mouth in the morning, at noon, in the evening, and at bedtime. ?  ?HYDROcodone-acetaminophen 5-325 MG tablet ?Commonly known as: NORCO/VICODIN ?Take 1-2 tablets by mouth every 4 (four) hours as needed for  moderate pain (pain score 4-6). ?  ?hydrocortisone 2.5 % cream ?Apply to aa's of face and ears QD on Monday, Wednesday, and Friday. ?What changed:  ?how much to take ?how to take this ?when to take this ?reasons to take this ?additional instructions ?  ?Insulin Pen Needle 31G X 5 MM Misc ?Use with insulin to inject into skin 3 times daily as directed ?  ?ketoconazole 2 % cream ?Commonly known as: NIZORAL ?Apply to aa's face and ears QD on Tuesday, Thursday, and Saturday. ?What changed:  ?how much to take ?how to take this ?when to take this ?reasons to take this ?additional instructions ?  ?losartan 50 MG tablet ?Commonly known as: COZAAR ?TAKE 1 TABLET(50 MG) BY  MOUTH DAILY ?  ?metFORMIN 500 MG 24 hr tablet ?Commonly known as: GLUCOPHAGE-XR ?TAKE 2 TABLETS(1000 MG) BY MOUTH TWICE DAILY WITH A MEAL ?  ?methocarbamol 500 MG tablet ?Commonly known as: ROBAXIN ?Take 1 tablet (500 mg total) by mouth every 6 (six) hours as needed for muscle spasms. ?  ?multivitamin capsule ?Take 1 capsule by mouth daily. ?  ?NovoLOG FlexPen 100 UNIT/ML FlexPen ?Generic drug: insulin aspart ?Inject 14 Units into the skin 3 (three) times daily with meals. Sliding scale ?  ?omeprazole 20 MG capsule ?Commonly known as: PRILOSEC ?Take 1 capsule (20 mg total) by mouth daily before breakfast. ?  ?Ozempic (1 MG/DOSE) 4 MG/3ML Sopn ?Generic drug: Semaglutide (1 MG/DOSE) ?Inject 1 mg into the skin once a week. ?  ?pioglitazone 15 MG tablet ?Commonly known as: ACTOS ?TAKE 1 TABLET(15 MG) BY MOUTH DAILY ?  ?polyethylene glycol 17 g packet ?Commonly known as: MIRALAX / GLYCOLAX ?Take 17 g by mouth daily as needed for mild constipation. ?  ?Probiotic 1-250 BILLION-MG Caps ?Take 1 capsule by mouth daily. ?  ?tamsulosin 0.4 MG Caps capsule ?Commonly known as: FLOMAX ?TAKE 1 CAPSULE BY MOUTH EVERY NIGHT AT BEDTIME FOR URINE RETENTION ?  ?traMADol 50 MG tablet ?Commonly known as: ULTRAM ?Take 1 tablet (50 mg total) by mouth every 6 (six) hours as  needed. ?  ?traZODone 100 MG tablet ?Commonly known as: DESYREL ?Take 1 tablet (100 mg total) by mouth at bedtime. ?  ?Tyler Aas FlexTouch 100 UNIT/ML FlexTouch Pen ?Generic drug: insulin degludec ?Inject 68 Units into the ski

## 2022-04-21 NOTE — H&P (Signed)
?Chief Complaint  ?Patient presents with  ? Left Knee - Follow-up, Pain  ?TKA 04/21/22  ? ? ?History of the Present Illness: ?Eric Andrews is a 71 y.o. male here today.  ? ?The patient presents for H and P for left total knee arthroplasty to be performed on 04/21/2022. He had x-rays in 12/2021 that showed severe bilateral knee osteoarthritis, tricompartmental symptoms, left knee worse radiographically with a large varus deformity. ? ?The patient states his health has been good. He states he has neuropathy in his legs. He has had a left knee arthroscopy in the past. The patient states he can walk across blocks 2 times, but 1 year ago he could not walk even to the parking lot due to his neuropathy. He states he is exercising and doing his effort to walk. ? ?The patient has no history of blood clots. He is taking aspirin daily. His A1c is under 8. ? ?The patient is originally from [place]. He plans to visit [facility] first week of 06/2022.  ? ?I have reviewed past medical, surgical, social and family history, and allergies as documented in the EMR. ? ?Past Medical History: ?Past Medical History:  ?Diagnosis Date  ? Arthritis  ? Diabetes (CMS-HCC)  ? ?Past Surgical History: ?History reviewed. No pertinent surgical history. ? ?Past Family History: ?History reviewed. No pertinent family history. ? ?Medications: ?Current Outpatient Medications Ordered in Epic  ?Medication Sig Dispense Refill  ? aspirin 81 MG EC tablet Take by mouth  ? atorvastatin (LIPITOR) 20 MG tablet Take by mouth  ? busPIRone (BUSPAR) 7.5 MG tablet Take 1 tablet (7.5 mg total) by mouth 2 (two) times daily 60 tablet 3  ? celecoxib (CELEBREX) 200 MG capsule Take by mouth  ? cetirizine (ZYRTEC) 10 MG tablet Take by mouth  ? chlorthalidone 25 MG tablet Take by mouth  ? cholecalciferol (VITAMIN D3) 1000 unit tablet Take by mouth  ? CONCENTRATED insulin glargine (TOUJEO SOLOSTAR U-300 INSULIN) pen injector (concentration 300 units/mL) Inject  subcutaneously  ? dapagliflozin (FARXIGA) 5 mg Tab tablet Take 5 mg by mouth once daily  ? docosahexaenoic acid-epa 120-180 mg Cap Take by mouth  ? fenofibrate micronized (LOFIBRA) 134 MG capsule Take by mouth  ? finasteride (PROSCAR) 5 mg tablet Take by mouth  ? gabapentin (NEURONTIN) 300 MG capsule 1 po tid 90 capsule 2  ? insulin ASPART (NOVOLOG FLEXPEN) pen injector (concentration 100 units/mL) Inject subcutaneously  ? insulin DEGLUDEC (TRESIBA FLEXTOUCH U-100) pen injector (concentration 100 units/mL) Inject subcutaneously  ? insulin LISPRO (HUMALOG) injection (concentration 100 units/mL) Inject subcutaneously  ? losartan (COZAAR) 50 MG tablet Take by mouth  ? metFORMIN (GLUCOPHAGE-XR) 500 MG XR tablet Take 1,000 mg by mouth 2 (two) times daily  ? omeprazole (PRILOSEC) 20 MG DR capsule Take 1 capsule by mouth once daily  ? pioglitazone (ACTOS) 15 MG tablet Take 1 tablet by mouth once daily  ? propranoloL (INDERAL) 20 MG tablet Take by mouth  ? semaglutide (OZEMPIC) 1 mg/dose (4 mg/3 mL) pen injector Inject subcutaneously once a week  ? tamsulosin (FLOMAX) 0.4 mg capsule Take by mouth  ? traZODone (DESYREL) 100 MG tablet Take 1 tablet (100 mg total) by mouth at bedtime 30 tablet 3  ? venlafaxine (EFFEXOR-XR) 150 MG XR capsule Take by mouth  ? ?No current Epic-ordered facility-administered medications on file.  ? ?Allergies: ?Allergies  ?Allergen Reactions  ? Mite Extract Other (See Comments)  ? ? ?Body mass index is 44.11 kg/m?. ? ?Review of  Systems: ?A comprehensive 14 point ROS was performed, reviewed, and the pertinent orthopaedic findings are documented in the HPI. ? ?Vitals:  ?04/06/22 1004  ?BP: 134/72  ? ? ?General Physical Examination:  ? ?Lungs are clear. Heart rate and rhythm is normal. HEENT is normal. ? ?On exam, left knee has full extension, flexion to 110 degrees. Varus deformity that is not correctable. 2+ pitting edema to the left knee. Good left ankle dorsiflexion. No numbness or tingling. Left  hip has 15 degrees internal rotation and 60 degrees external rotation. Stable gait. ? ?Radiographs: ? ?No new imaging studies were obtained today. ? ?Assessment: ?ICD-10-CM  ?1. Primary osteoarthritis of left knee M17.12  ? ?Plan: ? ?The patient has clinical findings of severe left knee osteoarthritis, tricompartmental symptoms, left knee worse radiographically with large varus deformity. ? ?We discussed the patient's prior x-ray findings. I explained he has severe left knee osteoarthritis. I recommend left total knee arthroplasty. I explained the surgery and postoperative course in detail. He will spend 1 night in the hospital. I advised him to bring his crutches to his next visit to make sure it is the right size. I explained he will go home with a Prevena wound VAC on the front of his knee. ? ?We will schedule the patient for left total knee arthroplasty in 2 weeks. ? ?Surgical Risks: ? ?The nature of the condition and the proposed procedure has been reviewed in detail with the patient. Surgical versus non-surgical options and prognosis for recovery have been reviewed and the inherent risks and benefits of each have been discussed including the risks of infection, bleeding, injury to nerves/blood vessels/tendons, incomplete relief of symptoms, persisting pain and/or stiffness, loss of function, complex regional pain syndrome, failure of the procedure, as appropriate. ? ?Teeth: Nothing loose or removable. ? ?Document Attestation: ?I, Sowjanya Panditi, have reviewed and updated documentation for [facility], MD, utilizing Nuance DAX.  ? ?Electronically signed by Lauris Poag, MD at 04/07/2022 3:13 PM EDT ? ?Reviewed  H+P. ?No changes noted. ? ? ?

## 2022-04-21 NOTE — TOC Progression Note (Signed)
Transition of Care (TOC) - Progression Note  ? ? ?Patient Details  ?Name: Eric Andrews ?MRN: [id number] ?Date of Birth: 30-Jul-1951 ? ?Transition of Care (TOC) CM/SW Contact  ?Conception Oms, RN ?Phone Number: ?04/21/2022, 2:49 PM ? ?Clinical Narrative:    ?The patient is set up with [facility] for [facility] services, prior to Surgery by surgeons office ? ? ?  ?  ? ?Expected Discharge Plan and Services ?  ?  ?  ?  ?  ?                ?  ?  ?  ?  ?  ?  ?  ?  ?  ?  ? ? ?Social Determinants of Health (SDOH) Interventions ?  ? ?Readmission Risk Interventions ?   ? View : No data to display.  ?  ?  ?  ? ? ?

## 2022-04-21 NOTE — Anesthesia Preprocedure Evaluation (Addendum)
Anesthesia Evaluation  ?Patient identified by MRN, date of birth, ID band ?Patient awake ? ? ? ?Reviewed: ?Allergy & Precautions, H&P , NPO status , Patient's Chart, lab work & pertinent test results, reviewed documented beta blocker date and time  ? ?History of Anesthesia Complications ?Negative for: history of anesthetic complications ? ?Airway ?Mallampati: III ? ?TM Distance: >3 FB ?Neck ROM: full ? ? ? Dental ? ?(+) Dental Advidsory Given, Caps, Poor Dentition ?Permanent bridge on bottom left:   ?Pulmonary ?neg shortness of breath, sleep apnea and Continuous Positive Airway Pressure Ventilation , neg COPD, neg recent URI,  ?  ?Pulmonary exam normal ?breath sounds clear to auscultation ? ? ? ? ? ? Cardiovascular ?Exercise Tolerance: Good ?hypertension, (-) angina(-) Past MI and (-) Cardiac Stents Normal cardiovascular exam(-) dysrhythmias (-) Valvular Problems/Murmurs ?Rhythm:regular Rate:Normal ? ? ?  ?Neuro/Psych ?PSYCHIATRIC DISORDERS Anxiety Depression negative neurological ROS ?   ? GI/Hepatic ?Neg liver ROS, GERD  ,  ?Endo/Other  ?diabetesMorbid obesity ? Renal/GU ?Renal disease (kidney stones)  ?negative genitourinary ?  ?Musculoskeletal ? ? Abdominal ?  ?Peds ? Hematology ?negative hematology ROS ?(+)   ?Anesthesia Other Findings ?Past Medical History: ?No date: ADD (attention deficit disorder) ?No date: Anemia ?No date: Anxiety ?No date: Arthritis ?No date: Depression ?No date: Diabetes mellitus without complication ([place]) ?No date: Enlarged prostate ?No date: GERD (gastroesophageal reflux disease) ?No date: History of kidney stones ?No date: Hypertension ?No date: Knee pain ?No date: Neuromuscular disorder ([place]) ?    Comment:  lower legs- neuropathy ?No date: Sleep apnea ? ? Reproductive/Obstetrics ?negative OB ROS ? ?  ? ? ? ? ? ? ? ? ? ? ? ? ? ?  ?  ? ? ? ? ? ? ? ?Anesthesia Physical ?Anesthesia Plan ? ?ASA: 3 ? ?Anesthesia Plan: Spinal  ? ?Post-op Pain Management:    ? ?Induction:  ? ?PONV Risk Score and Plan: Propofol infusion and TIVA ? ?Airway Management Planned: Natural Airway and Nasal Cannula ? ?Additional Equipment:  ? ?Intra-op Plan:  ? ?Post-operative Plan:  ? ?Informed Consent: I have reviewed the patients History and Physical, chart, labs and discussed the procedure including the risks, benefits and alternatives for the proposed anesthesia with the patient or authorized representative who has indicated his/her understanding and acceptance.  ? ? ? ?Dental Advisory Given ? ?Plan Discussed with: Anesthesiologist, CRNA and Surgeon ? ?Anesthesia Plan Comments:   ? ? ? ? ? ? ?Anesthesia Quick Evaluation ? ?

## 2022-04-21 NOTE — Transfer of Care (Signed)
Immediate Anesthesia Transfer of Care Note ? ?Patient: Eric Andrews ? ?Procedure(s) Performed: TOTAL KNEE ARTHROPLASTY (Left: Knee) ? ?Patient Location: PACU ? ?Anesthesia Type:Spinal ? ?Level of Consciousness: drowsy ? ?Airway & Oxygen Therapy: Patient Spontanous Breathing and Patient connected to face mask oxygen ? ?Post-op Assessment: Report given to RN and Post -op Vital signs reviewed and stable ? ?Post vital signs: Reviewed and stable ? ?Last Vitals:  ?Vitals Value Taken Time  ?BP 103/62   ?Temp    ?Pulse 76 04/21/22 0953  ?Resp 18 04/21/22 0953  ?SpO2 97 % 04/21/22 0953  ?Vitals shown include unvalidated device data. ? ?Last Pain:  ?Vitals:  ? 04/21/22 0611  ?TempSrc: Oral  ?PainSc: 3   ?   ? ?  ? ?Complications: No notable events documented. ?

## 2022-04-21 NOTE — Op Note (Signed)
04/21/2022 ? ?9:59 AM ? ?PATIENT:  Eric Andrews  ? ?MRN: [id number] ? ?PRE-OPERATIVE DIAGNOSIS:  Primary localized osteoarthritis of left knee ?  ?POST-OPERATIVE DIAGNOSIS:  Same ?  ?PROCEDURE:  Procedure(s): ?Left TOTAL KNEE ARTHROPLASTY ?  ?SURGEON: Laurene Footman, MD ?  ?ASSISTANTS: Rachelle Hora, PA-C ?  ?ANESTHESIA:   spinal ?  ?EBL: 150 ?  ?BLOOD ADMINISTERED:none ?  ?DRAINS:  Incisional wound VAC   ?  ?LOCAL MEDICATIONS USED:  MARCAINE    and OTHER morphine and Exparel ?  ?SPECIMEN:  No Specimen ?  ?DISPOSITION OF SPECIMEN:  N/A ?  ?COUNTS:  YES ?  ?TOURNIQUET: 21 minutes at 300 mm Hg ?  ?IMPLANTS: Medacta  GMK sphere system with 5 left femur, 4 left tibia with short stem and 10 mm insert.  Size 3 patella, all components cemented. ?  ?DICTATION: .Dragon Dictation   patient was brought to the operating room and spinal anesthesia was obtained.  After prepping and draping the left leg in sterile fashion, and after patient identification and timeout procedures were completed, midline skin incision was made followed by medial parapatellar arthrotomy with severe medial compartment osteoarthritis, severe patellofemoral arthritis and mild lateral compartment arthritis, partial synovectomy was also carried out.   The ACL and PCL and fat pad were excised along with anterior horns of the meniscus. The proximal tibia cutting guide from  the [facility] system was applied and the proximal tibia cut carried out.  The distal femoral cut was carried out in a similar fashion     The 5 femoral cutting guide applied with anterior posterior and chamfer cuts made.  The posterior horns of the menisci were removed at this point.   Injection of the above medication was carried out after the femoral and tibial cuts were carried out.  The 4 baseplate trial was placed pinned into position and proximal tibial preparation carried out with drilling hand reaming and the keel punch followed by placement of the 5 femur and sizing the tibial insert  size 10 millimeter gave the best fit with stability and full extension.  The distal femoral drill holes were made in the notch cut for the trochlear groove was then carried out with trials were then removed the patella was cut using the patellar cutting guide and it sized to a size 3 after drill holes have been made tourniquet was raised at this point.  The knee was irrigated with pulsatile lavage and the bony surfaces dried the tibial component was cemented into place first.  Excess cement was removed and the polyethylene insert placed with a torque screw placed with a torque screwdriver tightened.  The distal femoral component was placed and the knee was held in extension as the patellar button was clamped into place.  After the cement was set, excess cement was removed and the knee was again irrigated thoroughly thoroughly irrigated.  The tourniquet was let down and hemostasis checked with electrocautery. The arthrotomy was repaired with a heavy Quill suture,  followed by 3-0 V lock subcuticular closure, skin staples followed by incisional wound VAC and Polar Care.. ?  ?PLAN OF CARE: Admit for overnight observation ?  ?PATIENT DISPOSITION:  PACU - hemodynamically stable. ?  ?  ?   ?  ?  ?  ? ? ?

## 2022-04-21 NOTE — Discharge Instructions (Signed)
?ANTERIOR APPROACH TOTAL HIP REPLACEMENT POSTOPERATIVE DIRECTIONS ? ? ?Hip Rehabilitation, Guidelines Following Surgery  ?The results of a hip operation are greatly improved after range of motion and muscle strengthening exercises. Follow all safety measures which are given to protect your hip. If any of these exercises cause increased pain or swelling in your joint, decrease the amount until you are comfortable again. Then slowly increase the exercises. Call your caregiver if you have problems or questions.  ? ?HOME CARE INSTRUCTIONS  ?Remove items at home which could result in a fall. This includes throw rugs or furniture in walking pathways.  ?ICE to the affected hip every three hours for 30 minutes at a time and then as needed for pain and swelling.  Continue to use ice on the hip for pain and swelling from surgery. You may notice swelling that will progress down to the foot and ankle.  This is normal after surgery.  Elevate the leg when you are not up walking on it.   ?Continue to use the breathing machine which will help keep your temperature down.  It is common for your temperature to cycle up and down following surgery, especially at night when you are not up moving around and exerting yourself.  The breathing machine keeps your lungs expanded and your temperature down. ?Do not place pillow under knee, focus on keeping the knee straight while resting ? ?DIET ?You may resume your previous home diet once your are discharged from the hospital. ? ?DRESSING / WOUND CARE / SHOWERING ?Please remove provena negative pressure dressing on 04/28/2022 and apply honey comb dressing. Keep dressing clean and dry at all times.  ? ?ACTIVITY ?Walk with your walker as instructed. ?Use walker as long as suggested by your caregivers. ?Avoid periods of inactivity such as sitting longer than an hour when not asleep. This helps prevent blood clots.  ?You may resume a sexual relationship in one month or when given the OK by your  doctor.  ?You may return to work once you are cleared by your doctor.  ?Do not drive a car for 6 weeks or until released by you surgeon.  ?Do not drive while taking narcotics. ? ?WEIGHT BEARING ?Weight bearing as tolerated. Use walker/cane as needed for at least 4 weeks post op. ? ?POSTOPERATIVE CONSTIPATION PROTOCOL ?Constipation - defined medically as fewer than three stools per week and severe constipation as less than one stool per week. ? ?One of the most common issues patients have following surgery is constipation.  Even if you have a regular bowel pattern at home, your normal regimen is likely to be disrupted due to multiple reasons following surgery.  Combination of anesthesia, postoperative narcotics, change in appetite and fluid intake all can affect your bowels.  In order to avoid complications following surgery, here are some recommendations in order to help you during your recovery period. ? ?Colace (docusate) - Pick up an over-the-counter form of Colace or another stool softener and take twice a day as long as you are requiring postoperative pain medications.  Take with a full glass of water daily.  If you experience loose stools or diarrhea, hold the colace until you stool forms back up.  If your symptoms do not get better within 1 week or if they get worse, check with your doctor. ? ?Dulcolax (bisacodyl) - Pick up over-the-counter and take as directed by the product packaging as needed to assist with the movement of your bowels.  Take with a full glass of  water.  Use this product as needed if not relieved by Colace only.  ? ?MiraLax (polyethylene glycol) - Pick up over-the-counter to have on hand.  MiraLax is a solution that will increase the amount of water in your bowels to assist with bowel movements.  Take as directed and can mix with a glass of water, juice, soda, coffee, or tea.  Take if you go more than two days without a movement. ?Do not use MiraLax more than once per day. Call your doctor  if you are still constipated or irregular after using this medication for 7 days in a row. ? ?If you continue to have problems with postoperative constipation, please contact the office for further assistance and recommendations.  If you experience "the worst abdominal pain ever" or develop nausea or vomiting, please contact the office immediatly for further recommendations for treatment. ? ?ITCHING ? If you experience itching with your medications, try taking only a single pain pill, or even half a pain pill at a time.  You can also use Benadryl over the counter for itching or also to help with sleep.  ? ?TED HOSE STOCKINGS ?Wear the elastic stockings on both legs for six weeks following surgery during the day but you may remove then at night for sleeping. ? ?MEDICATIONS ?See your medication summary on the ?After Visit Summary? that the nursing staff will review with you prior to discharge.  You may have some home medications which will be placed on hold until you complete the course of blood thinner medication.  It is important for you to complete the blood thinner medication as prescribed by your surgeon.  Continue your approved medications as instructed at time of discharge. ? ?PRECAUTIONS ?If you experience chest pain or shortness of breath - call 911 immediately for transfer to the hospital emergency department.  ?If you develop a fever greater that 101 F, purulent drainage from wound, increased redness or drainage from wound, foul odor from the wound/dressing, or calf pain - CONTACT YOUR SURGEON.   ?                                                ?FOLLOW-UP APPOINTMENTS ?Make sure you keep all of your appointments after your operation with your surgeon and caregivers. You should call the office at the above phone number and make an appointment for approximately two weeks after the date of your surgery or on the date instructed by your surgeon outlined in the "After Visit Summary". ? ?RANGE OF MOTION AND  STRENGTHENING EXERCISES  ?These exercises are designed to help you keep full movement of your hip joint. Follow your caregiver's or physical therapist's instructions. Perform all exercises about fifteen times, three times per day or as directed. Exercise both hips, even if you have had only one joint replacement. These exercises can be done on a training (exercise) mat, on the floor, on a table or on a bed. Use whatever works the best and is most comfortable for you. Use music or television while you are exercising so that the exercises are a pleasant break in your day. This will make your life better with the exercises acting as a break in routine you can look forward to.  ?Lying on your back, slowly slide your foot toward your buttocks, raising your knee up off the floor. Then slowly slide your  foot back down until your leg is straight again.  ?Lying on your back spread your legs as far apart as you can without causing discomfort.  ?Lying on your side, raise your upper leg and foot straight up from the floor as far as is comfortable. Slowly lower the leg and repeat.  ?Lying on your back, tighten up the muscle in the front of your thigh (quadriceps muscles). You can do this by keeping your leg straight and trying to raise your heel off the floor. This helps strengthen the largest muscle supporting your knee.  ?Lying on your back, tighten up the muscles of your buttocks both with the legs straight and with the knee bent at a comfortable angle while keeping your heel on the floor.  ? ?IF YOU ARE TRANSFERRED TO A SKILLED REHAB FACILITY ?If the patient is transferred to a skilled rehab facility following release from the hospital, a list of the current medications will be sent to the facility for the patient to continue.  When discharged from the skilled rehab facility, please have the facility set up the patient's Home Health Physical Therapy prior to being released. Also, the skilled facility will be responsible for  providing the patient with their medications at time of release from the facility to include their pain medication, the muscle relaxants, and their blood thinner medication. If the patient is still at the reh

## 2022-04-21 NOTE — Discharge Summary (Addendum)
Physician Discharge Summary  Patient ID: Eric Andrews MRN: [id number] DOB/AGE: 1951/10/14 71 y.o.  Admit date: 04/21/2022 Discharge date: 04/24/2022  Admission Diagnoses:  S/P TKR (total knee replacement) using cement, left [Z96.652]   Discharge Diagnoses: Patient Active Problem List   Diagnosis Date Noted   S/P TKR (total knee replacement) using cement, left 04/21/2022   Bilateral primary osteoarthritis of knee 12/17/2021   Gastroesophageal reflux disease without esophagitis 07/08/2021   Environmental and seasonal allergies 02/27/2021   Hyperlipidemia associated with type 2 diabetes mellitus ([place]) 02/27/2021   Morbid obesity with BMI of 40.0-44.9, adult ([place]) 06/26/2020   Major depressive disorder, recurrent, moderate ([place]) 06/25/2020   GAD (generalized anxiety disorder) 06/25/2020   Type 2 diabetes mellitus with other specified complication ([place]) 75/09/2584   Chronic pain of both knees 05/27/2020   Primary osteoarthritis involving multiple joints 05/27/2020   Cataract associated with type 2 diabetes mellitus ([place]) 05/27/2020   History of torn meniscus of left knee 05/27/2020   DDD (degenerative disc disease), lumbar 05/27/2020    Past Medical History:  Diagnosis Date   ADD (attention deficit disorder)    Anemia    Anxiety    Arthritis    Depression    Diabetes mellitus without complication ([place])    Enlarged prostate    GERD (gastroesophageal reflux disease)    History of kidney stones    Hypertension    Knee pain    Neuromuscular disorder ([place])    lower legs- neuropathy   Sleep apnea      Transfusion: none   Consultants (if any):   Discharged Condition: Improved  Hospital Course: Eric Andrews is an 71 y.o. male who was admitted 04/21/2022 with a diagnosis of S/P TKR (total knee replacement) using cement, left and went to the operating room on 04/21/2022 and underwent the above named procedures.    Surgeries: Procedure(s): TOTAL KNEE ARTHROPLASTY on  04/21/2022 Patient tolerated the surgery well. Taken to PACU where she was stabilized and then transferred to the orthopedic floor.  Started on Lovenox 30 mg q 12 hrs. SCDs applied bilaterally at 80 mm. Heels elevated on bed with rolled towels. No evidence of DVT. Negative [place]. Physical therapy started on day #1 for gait training and transfer. OT started day #1 for ADL and assisted devices.  Patient's foley was d/c on day #1. Patient's IV was d/c on day #1.  On post op day #3 patient was stable and ready for discharge to home with HHPT.    He was given perioperative antibiotics:  Anti-infectives (From admission, onward)    Start     Dose/Rate Route Frequency Ordered Stop   04/21/22 1400  ceFAZolin (ANCEF) IVPB 2g/100 mL premix        2 g 200 mL/hr over 30 Minutes Intravenous Every 6 hours 04/21/22 1107 04/21/22 2153   04/21/22 0600  ceFAZolin (ANCEF) IVPB 3g/100 mL premix        3 g 200 mL/hr over 30 Minutes Intravenous On call to O.R. 04/21/22 0121 04/21/22 0815     .  He was given sequential compression devices, early ambulation, and Lovenox TEDs for DVT prophylaxis.  He benefited maximally from the hospital stay and there were no complications.    Recent vital signs:  Vitals:   04/24/22 0802 04/24/22 1216  BP: (!) 163/85 138/82  Pulse: 93 (!) 104  Resp:    Temp: 99.2 F (37.3 C) 99.4 F (37.4 C)  SpO2: 100% 95%    Recent laboratory studies:  Lab Results  Component Value Date   HGB 14.6 04/21/2022   HGB 16.1 04/10/2022   HGB 16.2 07/29/2021   Lab Results  Component Value Date   WBC 7.2 04/21/2022   PLT 152 04/21/2022   No results found for: INR Lab Results  Component Value Date   NA 137 04/10/2022   K 3.7 04/10/2022   CL 102 04/10/2022   CO2 26 04/10/2022   BUN 31 (H) 04/10/2022   CREATININE 1.12 04/21/2022   GLUCOSE 125 (H) 04/10/2022    Discharge Medications:   Allergies as of 04/24/2022       Reactions   Dust Mite Extract          Medication List     STOP taking these medications    celecoxib 200 MG capsule Commonly known as: CELEBREX   Fluocinolone Acetonide 0.01 % Oil Commonly known as: DermOtic   mupirocin ointment 2 % Commonly known as: BACTROBAN       TAKE these medications    aspirin EC 81 MG tablet Take 81 mg by mouth daily.   atorvastatin 20 MG tablet Commonly known as: LIPITOR TAKE 1 TABLET BY MOUTH AT BEDTIME   busPIRone 7.5 MG tablet Commonly known as: BUSPAR Take 7.5 mg by mouth 2 (two) times daily. What changed: Another medication with the same name was removed. Continue taking this medication, and follow the directions you see here.   cetirizine 10 MG tablet Commonly known as: ZYRTEC TAKE 1 TABLET(10 MG) BY MOUTH AT BEDTIME   chlorthalidone 25 MG tablet Commonly known as: HYGROTON TAKE 1 TABLET(25 MG) BY MOUTH DAILY   cholecalciferol 25 MCG (1000 UNIT) tablet Commonly known as: VITAMIN D3 Take 1,000 Units by mouth daily.   docusate sodium 100 MG capsule Commonly known as: COLACE Take 1 capsule (100 mg total) by mouth 2 (two) times daily.   enoxaparin 40 MG/0.4ML injection Commonly known as: LOVENOX Inject 0.4 mLs (40 mg total) into the skin daily for 14 days.   Farxiga 10 MG Tabs tablet Generic drug: dapagliflozin propanediol Take 10 mg by mouth daily before breakfast.   fenofibrate micronized 134 MG capsule Commonly known as: LOFIBRA TAKE 1 CAPSULE(134 MG) BY MOUTH DAILY BEFORE BREAKFAST What changed: See the new instructions.   finasteride 5 MG tablet Commonly known as: PROSCAR TAKE 1 TABLET(5 MG) BY MOUTH EVERY EVENING   Fish Oil 1200 MG Caps Take 1,200 mg by mouth in the morning and at bedtime.   gabapentin 600 MG tablet Commonly known as: NEURONTIN Take 600 mg by mouth in the morning, at noon, in the evening, and at bedtime.   hydrocortisone 2.5 % cream Apply to aa's of face and ears QD on Monday, Wednesday, and Friday. What changed:  how much to  take how to take this when to take this reasons to take this additional instructions   Insulin Pen Needle 31G X 5 MM Misc Use with insulin to inject into skin 3 times daily as directed   ketoconazole 2 % cream Commonly known as: NIZORAL Apply to aa's face and ears QD on Tuesday, Thursday, and Saturday. What changed:  how much to take how to take this when to take this reasons to take this additional instructions   losartan 50 MG tablet Commonly known as: COZAAR TAKE 1 TABLET(50 MG) BY MOUTH DAILY   metFORMIN 500 MG 24 hr tablet Commonly known as: GLUCOPHAGE-XR TAKE 2 TABLETS(1000 MG) BY MOUTH TWICE DAILY WITH A MEAL   methocarbamol 500  MG tablet Commonly known as: ROBAXIN Take 1 tablet (500 mg total) by mouth every 6 (six) hours as needed for muscle spasms.   multivitamin capsule Take 1 capsule by mouth daily.   NovoLOG FlexPen 100 UNIT/ML FlexPen Generic drug: insulin aspart Inject 14 Units into the skin 3 (three) times daily with meals. Sliding scale   omeprazole 20 MG capsule Commonly known as: PRILOSEC Take 1 capsule (20 mg total) by mouth daily before breakfast.   Oxycodone HCl 10 MG Tabs Take 1 tablet (10 mg total) by mouth every 3 (three) hours as needed.   Ozempic (1 MG/DOSE) 4 MG/3ML Sopn Generic drug: Semaglutide (1 MG/DOSE) Inject 1 mg into the skin once a week.   pioglitazone 15 MG tablet Commonly known as: ACTOS TAKE 1 TABLET(15 MG) BY MOUTH DAILY   polyethylene glycol 17 g packet Commonly known as: MIRALAX / GLYCOLAX Take 17 g by mouth daily as needed for mild constipation.   Probiotic 1-250 BILLION-MG Caps Take 1 capsule by mouth daily.   tamsulosin 0.4 MG Caps capsule Commonly known as: FLOMAX TAKE 1 CAPSULE BY MOUTH EVERY NIGHT AT BEDTIME FOR URINE RETENTION   traMADol 50 MG tablet Commonly known as: ULTRAM Take 1 tablet (50 mg total) by mouth every 6 (six) hours as needed.   traZODone 100 MG tablet Commonly known as: DESYREL Take  1 tablet (100 mg total) by mouth at bedtime.   Tyler Aas FlexTouch 100 UNIT/ML FlexTouch Pen Generic drug: insulin degludec Inject 68 Units into the skin at bedtime. What changed:  how much to take when to take this   venlafaxine XR 150 MG 24 hr capsule Commonly known as: EFFEXOR-XR Take 1 capsule (150 mg total) by mouth daily with breakfast.   vitamin C 1000 MG tablet Take 1,000 mg by mouth daily.               Durable Medical Equipment  (From admission, onward)           Start     Ordered   04/22/22 0938  DME Walker rolling  Once       Comments: bariatric  Question Answer Comment  Walker: With 5 Inch Wheels   Patient needs a walker to treat with the following condition S/P TKR (total knee replacement) using cement, left      04/22/22 0938   04/21/22 1108  DME 3 n 1  Once        04/21/22 1107   04/21/22 1108  DME Bedside commode  Once       Question:  Patient needs a bedside commode to treat with the following condition  Answer:  S/P TKR (total knee replacement) using cement, left   04/21/22 1107            Diagnostic Studies: DG Knee 1-2 Views Left  Result Date: 04/21/2022 CLINICAL DATA:  A 71 year old male per dense for evaluation of postoperative pain following LEFT total knee arthroplasty. EXAM: LEFT KNEE - 1-2 VIEW COMPARISON:  CT of the knee from March 10, 2022. FINDINGS: Alignment of the knee is anatomic following LEFT total knee arthroplasty. Skin staples overlie the anterior knee as expected with small amounts of gas within the joint also expected in the immediate postoperative setting. No sign of fracture. No unexpected radiographic findings. IMPRESSION: Post total knee arthroplasty without immediate unexpected radiographic findings. Electronically Signed   By: Zetta Bills M.D.   On: 04/21/2022 10:32    Disposition: Discharge disposition: 06-Home-Health Care Svc  Follow-up Information     Duanne Guess, PA-C Follow up in 2  week(s).   Specialties: Orthopedic Surgery, Emergency Medicine Contact information: [place] [address] [phone]                  Signed: Feliberto Gottron 04/24/2022, 12:21 PM

## 2022-04-22 ENCOUNTER — Encounter: Payer: Self-pay | Admitting: Orthopedic Surgery

## 2022-04-22 LAB — GLUCOSE, CAPILLARY
Glucose-Capillary: 189 mg/dL — ABNORMAL HIGH (ref 70–99)
Glucose-Capillary: 207 mg/dL — ABNORMAL HIGH (ref 70–99)
Glucose-Capillary: 176 mg/dL — ABNORMAL HIGH (ref 70–99)
Glucose-Capillary: 150 mg/dL — ABNORMAL HIGH (ref 70–99)

## 2022-04-22 MED ORDER — OXYCODONE HCL 10 MG PO TABS: 10.0000 mg | ORAL_TABLET | ORAL | 0 refills | Status: DC | PRN

## 2022-04-22 MED ORDER — OXYCODONE HCL 5 MG PO TABS
10.0000 mg | ORAL_TABLET | ORAL | Status: DC | PRN
  Administered 2022-04-22 – 2022-04-24 (×9): 10 mg via ORAL
  Filled 2022-04-22 (×11): qty 2

## 2022-04-22 NOTE — Evaluation (Signed)
Occupational Therapy Evaluation ?Patient Details ?Name: Eric Andrews ?MRN: [id number] ?DOB: [birth year] ?Today's Date: 04/22/2022 ? ? ?History of Present Illness Pt is a 71 y.o. male s/p L TKA secondary to primary localized OA 5/16.  PMH includes B LE neuropathy, sleep apnea with CPAP, htn, anxiety, and DM.  ? ?Clinical Impression ?  ?Pt was seen for OT evaluation this date. Prior to hospital admission, pt was Independent for mobility and ADLs. Pt lives with family. Pt presents to acute OT demonstrating impaired ADL performance and functional mobility 2/2 decreased activity tolerance and functional strength/ROM/balance deficits. Pt currently requires MIN A for LB access in sitting. CGA + RW for toilet t/f, requires increased time and standing rest breaks. Pt would benefit from skilled OT to address noted impairments and functional limitations (see below for any additional details). Upon hospital discharge, recommend HHOT to maximize pt safety and return to PLOF.  ? ?Recommendations for follow up therapy are one component of a multi-disciplinary discharge planning process, led by the attending physician.  Recommendations may be updated based on patient status, additional functional criteria and insurance authorization.  ? ?Follow Up Recommendations ? Home health OT  ?  ?Assistance Recommended at Discharge Set up Supervision/Assistance  ?Patient can return home with the following A little help with walking and/or transfers;A little help with bathing/dressing/bathroom ? ?  ?Functional Status Assessment ? Patient has had a recent decline in their functional status and demonstrates the ability to make significant improvements in function in a reasonable and predictable amount of time.  ?Equipment Recommendations ? BSC/3in1  ?  ?Recommendations for Other Services   ? ? ?  ?Precautions / Restrictions Precautions ?Precautions: Knee;Fall ?Restrictions ?Weight Bearing Restrictions: Yes ?LLE Weight Bearing: Weight bearing as  tolerated  ? ?  ? ?Mobility Bed Mobility ?Overal bed mobility: Needs Assistance ?Bed Mobility: Supine to Sit ?  ?  ?Supine to sit: Min assist ?  ?  ?  ?  ? ?Transfers ?Overall transfer level: Needs assistance ?Equipment used: Rolling walker (2 wheels) ?Transfers: Sit to/from Stand ?Sit to Stand: Min assist ?  ?  ?  ?  ?  ?  ?  ? ?  ?Balance Overall balance assessment: Needs assistance ?Sitting-balance support: Feet supported ?Sitting balance-Leahy Scale: Good ?  ?  ?Standing balance support: Single extremity supported, During functional activity ?Standing balance-Leahy Scale: Fair ?  ?  ?  ?  ?  ?  ?  ?  ?  ?  ?  ?  ?   ? ?ADL either performed or assessed with clinical judgement  ? ?ADL Overall ADL's : Needs assistance/impaired ?  ?  ?  ?  ?  ?  ?  ?  ?  ?  ?  ?  ?  ?  ?  ?  ?  ?  ?  ?General ADL Comments: MIN A for LB access in sitting. CGA + RW for toilet t/f.  ? ? ? ? ?Pertinent Vitals/Pain Pain Assessment ?Pain Assessment: 0-10 ?Pain Score: 10-Worst pain ever ?Pain Location: L knee ?Pain Descriptors / Indicators: Aching, Tender, Sore ?Pain Intervention(s): Limited activity within patient's tolerance, Premedicated before session  ? ? ? ?Hand Dominance   ?  ?Extremity/Trunk Assessment Upper Extremity Assessment ?Upper Extremity Assessment: Overall WFL for tasks assessed ?  ?Lower Extremity Assessment ?Lower Extremity Assessment: Generalized weakness ?  ?  ?  ?Communication Communication ?Communication: No difficulties ?  ?Cognition Arousal/Alertness: Awake/alert ?Behavior During Therapy: [facility] for tasks assessed/performed ?Overall Cognitive Status:  Within Functional Limits for tasks assessed ?  ?  ?  ?  ?  ?  ?  ?  ?  ?  ?  ?  ?  ?  ?  ?  ?  ?  ?  ?General Comments    ? ?  ?Exercises   ?  ?Shoulder Instructions    ? ? ?Home Living Family/patient expects to be discharged to:: Private residence ?Living Arrangements: Children;Other relatives ?Available Help at Discharge: Family ?Type of Home: House ?Home Access: Ramped  entrance ?  ?  ?Home Layout: Two level;Able to live on main level with bedroom/bathroom ?  ?  ?Bathroom Shower/Tub: Walk-in shower ?  ?Bathroom Toilet: Standard ?  ?  ?Home Equipment: Toilet riser;Grab bars - toilet;Grab bars - tub/shower;Shower seat - built in;Cane - single Barista (2 wheels);BSC/3in1;Shower seat;Other (comment) ?  ?Additional Comments: Hand held shower head ?  ? ?  ?Prior Functioning/Environment Prior Level of Function : Independent/Modified Independent ?  ?  ?  ?  ?  ?  ?Mobility Comments: No recent falls. ?  ?  ? ?  ?  ?OT Problem List: Decreased strength;Decreased range of motion;Decreased activity tolerance;Impaired balance (sitting and/or standing) ?  ?   ?OT Treatment/Interventions: Self-care/ADL training;Therapeutic exercise;Energy conservation;DME and/or AE instruction;Therapeutic activities;Balance training;Patient/family education  ?  ?OT Goals(Current goals can be found in the care plan section) Acute Rehab OT Goals ?Patient Stated Goal: to improve pain ?OT Goal Formulation: With patient ?Time For Goal Achievement: 05/06/22 ?Potential to Achieve Goals: Good ?ADL Goals ?Pt Will Perform Grooming: Independently;standing ?Pt Will Perform Lower Body Dressing: with modified independence;sit to/from stand ?Pt Will Transfer to Toilet: with modified independence;ambulating;regular height toilet  ?OT Frequency: Min 2X/week ?  ? ?Co-evaluation   ?  ?  ?  ?  ? ?  ?AM-PAC OT "6 Clicks" Daily Activity     ?Outcome Measure Help from another person eating meals?: None ?Help from another person taking care of personal grooming?: A Little ?Help from another person toileting, which includes using toliet, bedpan, or urinal?: A Little ?Help from another person bathing (including washing, rinsing, drying)?: A Little ?Help from another person to put on and taking off regular upper body clothing?: None ?Help from another person to put on and taking off regular lower body clothing?: A Little ?6  Click Score: 20 ?  ?End of Session Equipment Utilized During Treatment: Rolling walker (2 wheels) ? ?Activity Tolerance: Patient tolerated treatment well ?Patient left: Other (comment) (seated on commode with PT) ? ?OT Visit Diagnosis: Other abnormalities of gait and mobility (R26.89)  ?              ?Time: 9675-9163 ?OT Time Calculation (min): 26 min ?Charges:  OT General Charges ?$OT Visit: 1 Visit ?OT Evaluation ?$OT Eval Moderate Complexity: 1 Mod ?OT Treatments ?$Self Care/Home Management : 8-22 mins ? ?Dessie Coma, M.S. OTR/L  ?04/22/22, 4:29 PM  ?ascom [phone] ? ?

## 2022-04-22 NOTE — Progress Notes (Signed)
Physical Therapy Treatment ?Patient Details ?Name: Eric Andrews ?MRN: [id number] ?DOB: Jun 02, 1951 ?Today's Date: 04/22/2022 ? ? ?History of Present Illness Pt is a 71 y.o. male s/p L TKA secondary to primary localized OA 5/16.  PMH includes B LE neuropathy, sleep apnea with CPAP, htn, anxiety, and DM. ? ?  ?PT Comments  ? ? Pt was long sitting in bed upon arriving. He is A and O x 4 and cooperative throughout. Supportive family member was present during session. He reports having a lot of assistance at home. BP in bed 110/52, sitting EOB 105/51, standing 125/54. No symptoms of dizziness however pain meds were held to assist with elevation of BP. Pain limiting this session. RN/PA made aware. Pt was able to exit bed, stand to RW, and ambulate ~ 8 ft. Pain severely limits his abilities. Will coordinate PM session after pain medicine is issued. Author projects pt will progress quickly and will be safe to return home with HHPT at DC.  ?   ?Recommendations for follow up therapy are one component of a multi-disciplinary discharge planning process, led by the attending physician.  Recommendations may be updated based on patient status, additional functional criteria and insurance authorization. ? ?Follow Up Recommendations ? Home health PT ?  ?  ?Assistance Recommended at Discharge Intermittent Supervision/Assistance  ?Patient can return home with the following A little help with walking and/or transfers;A little help with bathing/dressing/bathroom;Assistance with cooking/housework;Assist for transportation;Help with stairs or ramp for entrance ?  ?Equipment Recommendations ? Rolling walker (2 wheels) (Bariatric RW, pt has toilet riser and refused BSC)  ?  ?   ?Precautions / Restrictions Precautions ?Precautions: Knee;Fall ?Precaution Booklet Issued: Yes (comment) ?Restrictions ?Weight Bearing Restrictions: Yes ?LLE Weight Bearing: Weight bearing as tolerated  ?  ? ?Mobility ? Bed Mobility ?Overal bed mobility: Needs  Assistance ?Bed Mobility: Supine to Sit ?  ?  ?Supine to sit: Min assist ?  ?  ?General bed mobility comments: min assist to exit L side of bed. BP in supine 110/52, sitting EOB 105/51, standing 125/54 ?  ? ?Transfers ?Overall transfer level: Needs assistance ?Equipment used: Rolling walker (2 wheels) (bariatric) ?Transfers: Sit to/from Stand ?Sit to Stand: Min guard ?  ?  ?  ?  ?  ?General transfer comment: CGA for safety. Vcs for handplacement and improved technique ?  ? ?Ambulation/Gait ?Ambulation/Gait assistance: Min guard ?Gait Distance (Feet): 8 Feet ?Assistive device: Rolling walker (2 wheels) ?Gait Pattern/deviations: Step-to pattern, Antalgic, Trunk flexed ?Gait velocity: decreased ?  ?  ?General Gait Details: pt ambulated ~ 8 ft total but was severely limited by pain. pain meds held earlier due to pt's low BP concerns. Will return this afternoon after pain emds are administered to further assess pt's gait and improve ROM/strength. ? ? ?  ?Balance Overall balance assessment: Needs assistance ?Sitting-balance support: Feet supported ?Sitting balance-Leahy Scale: Good ?  ?  ?Standing balance support: Bilateral upper extremity supported, During functional activity, Reliant on assistive device for balance ?Standing balance-Leahy Scale: Fair ?Standing balance comment: reliant on RW + UE support due to pain more so than balance deficits ?  ?  ?  ?Cognition Arousal/Alertness: Awake/alert ?Behavior During Therapy: [facility] for tasks assessed/performed ?Overall Cognitive Status: Within Functional Limits for tasks assessed ?   ?General Comments: Pt is A and O x 4. cooperative however severely limited by pain ?  ?  ? ?  ?   ?   ? ?Pertinent Vitals/Pain Pain Assessment ?Pain Assessment: 0-10 ?Pain Score:  8  ?Pain Location: L knee ?Pain Descriptors / Indicators: Aching, Tender, Sore ?Pain Intervention(s): Monitored during session, Repositioned, Patient requesting pain meds-RN notified  ? ? ? ?PT Goals (current goals can now  be found in the care plan section) Acute Rehab PT Goals ?Patient Stated Goal: to be able to walk ?Progress towards PT goals: Progressing toward goals ? ?  ?Frequency ? ? ? BID ? ? ? ?  ?PT Plan Current plan remains appropriate  ? ? ?   ?AM-PAC PT "6 Clicks" Mobility   ?Outcome Measure ? Help needed turning from your back to your side while in a flat bed without using bedrails?: A Little ?Help needed moving from lying on your back to sitting on the side of a flat bed without using bedrails?: A Little ?Help needed moving to and from a bed to a chair (including a wheelchair)?: A Little ?Help needed standing up from a chair using your arms (e.g., wheelchair or bedside chair)?: A Little ?Help needed to walk in hospital room?: A Little ?Help needed climbing 3-5 steps with a railing? : A Lot ?6 Click Score: 17 ? ?  ?End of Session   ?Activity Tolerance: Patient limited by pain ?Patient left: in chair;with call bell/phone within reach;with chair alarm set;with family/visitor present ?Nurse Communication: Mobility status;Precautions;Weight bearing status;Other (comment) ?PT Visit Diagnosis: Other abnormalities of gait and mobility (R26.89);Muscle weakness (generalized) (M62.81);Pain ?Pain - Right/Left: Left ?Pain - part of body: Knee ?  ? ? ?Time: 0935-1000 ?PT Time Calculation (min) (ACUTE ONLY): 25 min ? ?Charges:  $Gait Training: 8-22 mins ?$Therapeutic Activity: 8-22 mins          ?          ?Julaine Fusi PTA ?04/22/22, 10:28 AM  ? ?

## 2022-04-22 NOTE — Plan of Care (Signed)

## 2022-04-22 NOTE — Anesthesia Postprocedure Evaluation (Signed)
Anesthesia Post Note ? ?Patient: Eric Andrews ? ?Procedure(s) Performed: TOTAL KNEE ARTHROPLASTY (Left: Knee) ? ?Patient location during evaluation: Nursing Unit ?Anesthesia Type: Spinal ?Level of consciousness: awake ?Pain management: pain level controlled ?Respiratory status: spontaneous breathing ?Postop Assessment: no headache ?Anesthetic complications: no ? ? ?No notable events documented. ? ? ?Last Vitals:  ?Vitals:  ? 04/22/22 0038 04/22/22 0527  ?BP: (!) 99/53 117/66  ?Pulse: 70 79  ?Resp:  16  ?Temp:  36.9 ?C  ?SpO2:  98%  ?  ?Last Pain:  ?Vitals:  ? 04/22/22 0731  ?TempSrc:   ?PainSc: 6   ? ? ?  ?  ?  ?  ?  ?  ? ?Lerry Liner ? ? ? ? ?

## 2022-04-22 NOTE — Progress Notes (Signed)
Physical Therapy Treatment ?Patient Details ?Name: Eric Andrews ?MRN: [id number] ?DOB: [birth year] ?Today's Date: 04/22/2022 ? ? ?History of Present Illness Pt is a 71 y.o. male s/p L TKA secondary to primary localized OA 5/16.  PMH includes B LE neuropathy, sleep apnea with CPAP, htn, anxiety, and DM. ? ?  ?PT Comments  ? ? Pt Eric and eager to do what he can but has been unable to get a lot of pain meds due to low BP and  therefore exquisite pain t/o the session that clearly limited how much he could do/tolerate.  He was able to some very slow, guarded, clearly uncomfortable ambulation but could not tolerate a lot.  Similarly he showed good effort with exercises but simply could not tolerate as expected due to pain.  Knee ROM 5-78 (with considerable pain at both endranges).  Pt should be able to get more pain meds as BP improves with the hope that he is able to more fully participate tomorrow in the effort to safely manage at home at d/c.  ?Recommendations for follow up therapy are one component of a multi-disciplinary discharge planning process, led by the attending physician.  Recommendations may be updated based on patient status, additional functional criteria and insurance authorization. ? ?Follow Up Recommendations ? Follow physician's recommendations for discharge plan and follow up therapies ?  ?  ?Assistance Recommended at Discharge Intermittent Supervision/Assistance  ?Patient can return home with the following A little help with walking and/or transfers;A little help with bathing/dressing/bathroom;Assistance with cooking/housework;Assist for transportation;Help with stairs or ramp for entrance ?  ?Equipment Recommendations ? Rolling walker (2 wheels)  ?  ?Recommendations for Other Services   ? ? ?  ?Precautions / Restrictions Precautions ?Precautions: Knee;Fall ?Restrictions ?Weight Bearing Restrictions: Yes ?LLE Weight Bearing: Weight bearing as tolerated  ?  ? ?Mobility ? Bed Mobility ?Overal bed  mobility: Needs Assistance ?  ?  ?  ?Supine to sit: Min assist ?  ?  ?General bed mobility comments: Pt made a great effort with getting LEs back into bed.  Could not lift L LE independently but used R LE hook strategy to lift/swing leg around. Light assist to complete the movement ?  ? ?Transfers ?Overall transfer level: Needs assistance ?Equipment used: Rolling walker (2 wheels) ?Transfers: Sit to/from Stand ?Sit to Stand: Min assist ?  ?  ?  ?  ?  ?General transfer comment: Pt needed ligth assist to insure shifting weight forward.  Very heavy reliance on UEs ?  ? ?Ambulation/Gait ?Ambulation/Gait assistance: Min guard ?Gait Distance (Feet): 20 Feet ?Assistive device: Rolling walker (2 wheels) ?  ?  ?  ?  ?General Gait Details: Pt continues to have severe pain and hesitancy with WBing, heavy UE/walker use and overall quite limited per typical POD1 expectations ? ? ?Stairs ?  ?  ?  ?  ?  ? ? ?Wheelchair Mobility ?  ? ?Modified Rankin (Stroke Patients Only) ?  ? ? ?  ?Balance Overall balance assessment: Needs assistance ?Sitting-balance support: Feet supported ?Sitting balance-Leahy Scale: Good ?  ?  ?Standing balance support: Bilateral upper extremity supported ?Standing balance-Leahy Scale: Fair ?Standing balance comment: reliant on RW + UE support due to pain more so than balance deficits ?  ?  ?  ?  ?  ?  ?  ?  ?  ?  ?  ?  ? ?  ?Cognition Arousal/Alertness: Awake/alert ?Behavior During Therapy: [facility] for tasks assessed/performed ?Overall Cognitive Status: Within Functional Limits for tasks  assessed ?  ?  ?  ?  ?  ?  ?  ?  ?  ?  ?  ?  ?  ?  ?  ?  ?  ?  ?  ? ?  ?Exercises Total Joint Exercises ?Ankle Circles/Pumps: AROM, 10 reps ?Quad Sets: Strengthening, 10 reps ?Short Arc Quad: AAROM, 5 reps ?Heel Slides: AAROM, 5 reps (with gentle leg ext resistance) ?Hip ABduction/ADduction: AROM, AAROM, 10 reps ?Knee Flexion: PROM, 5 reps ?Goniometric ROM: 5-78 ? ?  ?General Comments   ?  ?  ? ?Pertinent Vitals/Pain Pain  Assessment ?Pain Assessment: 0-10 ?Pain Score: 10-Worst pain ever ?Pain Location: L knee  ? ? ?Home Living Family/patient expects to be discharged to:: Private residence ?Living Arrangements: Children;Other relatives ?Available Help at Discharge: Family ?Type of Home: House ?Home Access: Ramped entrance ?  ?  ?  ?Home Layout: Two level;Able to live on main level with bedroom/bathroom ?Home Equipment: Toilet riser;Grab bars - toilet;Grab bars - tub/shower;Shower seat - built in;Cane - single Barista (2 wheels);BSC/3in1;Shower seat;Other (comment) ?Additional Comments: Hand held shower head  ?  ?Prior Function    ?  ?  ?   ? ?PT Goals (current goals can now be found in the care plan section) Progress towards PT goals: Progressing toward goals (slow progression due to pain) ? ?  ?Frequency ? ? ? BID ? ? ? ?  ?PT Plan Current plan remains appropriate  ? ? ?Co-evaluation   ?  ?  ?  ?  ? ?  ?AM-PAC PT "6 Clicks" Mobility   ?Outcome Measure ? Help needed turning from your back to your side while in a flat bed without using bedrails?: A Little ?Help needed moving from lying on your back to sitting on the side of a flat bed without using bedrails?: A Little ?Help needed moving to and from a bed to a chair (including a wheelchair)?: A Little ?Help needed standing up from a chair using your arms (e.g., wheelchair or bedside chair)?: A Little ?Help needed to walk in hospital room?: A Little ?Help needed climbing 3-5 steps with a railing? : A Lot ?6 Click Score: 17 ? ?  ?End of Session Equipment Utilized During Treatment: Gait belt ?Activity Tolerance: Patient limited by pain ?Patient left: with bed alarm set;with call bell/phone within reach ?Nurse Communication: Mobility status;Patient requests pain meds ?PT Visit Diagnosis: Other abnormalities of gait and mobility (R26.89);Muscle weakness (generalized) (M62.81);Pain ?Pain - Right/Left: Left ?Pain - part of body: Knee ?  ? ? ?Time: 9983-3825 ?PT Time Calculation  (min) (ACUTE ONLY): 43 min ? ?Charges:  $Gait Training: 8-22 mins ?$Therapeutic Exercise: 8-22 mins ?$Therapeutic Activity: 8-22 mins          ?          ? ?Kreg Shropshire, DPT ?04/22/2022, 6:03 PM ? ?

## 2022-04-22 NOTE — Progress Notes (Signed)
Met with the patient in the room at the bedside ?The patient lives at Home with his wife ?The patient  currently has rolator and toilet riser, grab bars and 3 in 1 ?The patient will need rolling walker Bariatric to be delivered by [employer] to the bedside ?They have transportation with wife ?They can afford their medication ? ?They are set up with [facility] for Home health services ? ? ?

## 2022-04-22 NOTE — Progress Notes (Signed)
? ?  Subjective: ?1 Day Post-Op Procedure(s) (LRB): ?TOTAL KNEE ARTHROPLASTY (Left) ?Patient reports pain as moderate and severe.   ?Patient is well, and has had no acute complaints or problems ?Denies any CP, SOB, ABD pain. ?We will continue therapy today.  ?Plan is to go Home after hospital stay. ? ?Objective: ?Vital signs in last 24 hours: ?Temp:  [97.3 ?F (36.3 ?C)-98.5 ?F (36.9 ?C)] 98.4 ?F (36.9 ?C) (05/17 0527) ?Pulse Rate:  [55-96] 79 (05/17 0527) ?Resp:  [10-20] 16 (05/17 0527) ?BP: (85-118)/(43-70) 117/66 (05/17 0527) ?SpO2:  [96 %-99 %] 98 % (05/17 0527) ? ?Intake/Output from previous day: ?05/16 0701 - 05/17 0700 ?In: 1975 [P.O.:240; I.V.:3200; IV Piggyback:200] ?Out: 2050 [Urine:2000; Blood:50] ?Intake/Output this shift: ?No intake/output data recorded. ? ?Recent Labs  ?  04/21/22 ?1132  ?HGB 14.6  ? ?Recent Labs  ?  04/21/22 ?1132  ?WBC 7.2  ?RBC 5.24  ?HCT 45.4  ?PLT 152  ? ?Recent Labs  ?  04/21/22 ?1132  ?CREATININE 1.12  ? ?No results for input(s): LABPT, INR in the last 72 hours. ? ?EXAM ?General - Patient is Alert, Appropriate, and Oriented ?Extremity - Neurovascular intact ?Sensation intact distally ?Intact pulses distally ?Dorsiflexion/Plantar flexion intact ?Dressing - dressing C/D/I and no drainage, proevena intact without drainage ?Motor Function - intact, moving foot and toes well on exam.  ? ?Past Medical History:  ?Diagnosis Date  ? ADD (attention deficit disorder)   ? Anemia   ? Anxiety   ? Arthritis   ? Depression   ? Diabetes mellitus without complication ([place])   ? Enlarged prostate   ? GERD (gastroesophageal reflux disease)   ? History of kidney stones   ? Hypertension   ? Knee pain   ? Neuromuscular disorder ([place])   ? lower legs- neuropathy  ? Sleep apnea   ? ? ?Assessment/Plan:   ?1 Day Post-Op Procedure(s) (LRB): ?TOTAL KNEE ARTHROPLASTY (Left) ?Principal Problem: ?  S/P TKR (total knee replacement) using cement, left ? ?Estimated body mass index is 43.87 kg/m? as calculated from  the following: ?  Height as of this encounter: '5\' 10"'$  (1.778 m). ?  Weight as of this encounter: 138.7 kg. ?Advance diet ?Up with therapy ?Vital signs are stable ?Pain is moderate to severe.  Educated patient on pain regimen, has only had 1 dose of Norco since surgery and this was 930 last night. ?Labs pending ?Care management to assist with discharge to home with home health PT pending completion of PT goals. ? ?DVT Prophylaxis - TED hose and SCDs, Lovenox ?Weight-Bearing as tolerated to left leg ? ? ?T. Rachelle Hora, PA-C ?[place] ?04/22/2022, 7:20 AM ? ? ?

## 2022-04-23 DIAGNOSIS — F988 Other specified behavioral and emotional disorders with onset usually occurring in childhood and adolescence: Secondary | ICD-10-CM | POA: Diagnosis present

## 2022-04-23 DIAGNOSIS — F419 Anxiety disorder, unspecified: Secondary | ICD-10-CM | POA: Diagnosis present

## 2022-04-23 DIAGNOSIS — I10 Essential (primary) hypertension: Secondary | ICD-10-CM | POA: Diagnosis present

## 2022-04-23 DIAGNOSIS — Z7982 Long term (current) use of aspirin: Secondary | ICD-10-CM | POA: Diagnosis not present

## 2022-04-23 DIAGNOSIS — Z7984 Long term (current) use of oral hypoglycemic drugs: Secondary | ICD-10-CM | POA: Diagnosis not present

## 2022-04-23 DIAGNOSIS — Z794 Long term (current) use of insulin: Secondary | ICD-10-CM | POA: Diagnosis not present

## 2022-04-23 DIAGNOSIS — Z87442 Personal history of urinary calculi: Secondary | ICD-10-CM | POA: Diagnosis not present

## 2022-04-23 DIAGNOSIS — Z79899 Other long term (current) drug therapy: Secondary | ICD-10-CM | POA: Diagnosis not present

## 2022-04-23 DIAGNOSIS — E1140 Type 2 diabetes mellitus with diabetic neuropathy, unspecified: Secondary | ICD-10-CM | POA: Diagnosis present

## 2022-04-23 DIAGNOSIS — F32A Depression, unspecified: Secondary | ICD-10-CM | POA: Diagnosis present

## 2022-04-23 DIAGNOSIS — K219 Gastro-esophageal reflux disease without esophagitis: Secondary | ICD-10-CM | POA: Diagnosis present

## 2022-04-23 DIAGNOSIS — N400 Enlarged prostate without lower urinary tract symptoms: Secondary | ICD-10-CM | POA: Diagnosis present

## 2022-04-23 DIAGNOSIS — Z6841 Body Mass Index (BMI) 40.0 and over, adult: Secondary | ICD-10-CM | POA: Diagnosis not present

## 2022-04-23 DIAGNOSIS — G4730 Sleep apnea, unspecified: Secondary | ICD-10-CM | POA: Diagnosis present

## 2022-04-23 DIAGNOSIS — Z791 Long term (current) use of non-steroidal anti-inflammatories (NSAID): Secondary | ICD-10-CM | POA: Diagnosis not present

## 2022-04-23 DIAGNOSIS — M170 Bilateral primary osteoarthritis of knee: Secondary | ICD-10-CM | POA: Diagnosis present

## 2022-04-23 LAB — GLUCOSE, CAPILLARY
Glucose-Capillary: 191 mg/dL — ABNORMAL HIGH (ref 70–99)
Glucose-Capillary: 171 mg/dL — ABNORMAL HIGH (ref 70–99)
Glucose-Capillary: 174 mg/dL — ABNORMAL HIGH (ref 70–99)
Glucose-Capillary: 129 mg/dL — ABNORMAL HIGH (ref 70–99)

## 2022-04-23 MED ORDER — LOSARTAN POTASSIUM 50 MG PO TABS
50.0000 mg | ORAL_TABLET | Freq: Every day | ORAL | Status: DC
  Administered 2022-04-24: 50 mg via ORAL
  Filled 2022-04-23: qty 1

## 2022-04-23 MED ORDER — CHLORTHALIDONE 25 MG PO TABS
25.0000 mg | ORAL_TABLET | Freq: Every day | ORAL | Status: DC
  Administered 2022-04-24: 25 mg via ORAL
  Filled 2022-04-23: qty 1

## 2022-04-23 NOTE — NC FL2 (Signed)
[place] LEVEL OF CARE SCREENING TOOL     IDENTIFICATION  Patient Name: Eric Andrews Birthdate: Feb 14, 1951 Sex: male Admission Date (Current Location): 04/21/2022  [facility] and [place] Number:  Engineering geologist and Address:  [facility], [address] [place], [place] [address]      Provider Number: [id number]  Attending Physician Name and Address:  Hessie Knows, MD  Relative Name and Phone Number:  Asencion Partridge daughter [phone]    Current Level of Care: Hospital Recommended Level of Care: [place] Prior Approval Number:    Date Approved/Denied:   PASRR Number: [id number]  Discharge Plan: SNF    Current Diagnoses: Patient Active Problem List   Diagnosis Date Noted   S/P TKR (total knee replacement) using cement, left 04/21/2022   Bilateral primary osteoarthritis of knee 12/17/2021   Gastroesophageal reflux disease without esophagitis 07/08/2021   Environmental and seasonal allergies 02/27/2021   Hyperlipidemia associated with type 2 diabetes mellitus ([place]) 02/27/2021   Morbid obesity with BMI of 40.0-44.9, adult ([place]) 06/26/2020   Major depressive disorder, recurrent, moderate ([place]) 06/25/2020   GAD (generalized anxiety disorder) 06/25/2020   Type 2 diabetes mellitus with other specified complication ([place]) 73/53/2992   Chronic pain of both knees 05/27/2020   Primary osteoarthritis involving multiple joints 05/27/2020   Cataract associated with type 2 diabetes mellitus ([place]) 05/27/2020   History of torn meniscus of left knee 05/27/2020   DDD (degenerative disc disease), lumbar 05/27/2020    Orientation RESPIRATION BLADDER Height & Weight     Self, Situation, Time, Place  Normal Continent Weight: (!) 138.7 kg Height:  '5\' 10"'$  (177.8 cm)  BEHAVIORAL SYMPTOMS/MOOD NEUROLOGICAL BOWEL NUTRITION STATUS      Continent Diet (regular)  AMBULATORY STATUS COMMUNICATION OF NEEDS Skin   Extensive Assist Verbally  Normal, Surgical wounds                       Personal Care Assistance Level of Assistance  Bathing, Feeding, Dressing Bathing Assistance: Limited assistance Feeding assistance: Independent Dressing Assistance: Limited assistance     Functional Limitations Info             SPECIAL CARE FACTORS FREQUENCY  PT (By licensed PT), OT (By licensed OT)     PT Frequency: 5 times per week OT Frequency: 5 times per week            Contractures Contractures Info: Not present    Additional Factors Info  Code Status, Allergies Code Status Info: Full code Allergies Info: Dust Mite           Current Medications (04/23/2022):  This is the current hospital active medication list Current Facility-Administered Medications  Medication Dose Route Frequency Provider Last Rate Last Admin   0.9 %  sodium chloride infusion   Intravenous Continuous Hessie Knows, MD   Stopped at 04/21/22 1900   acetaminophen (TYLENOL) tablet 325-650 mg  325-650 mg Oral Q6H PRN Hessie Knows, MD       acidophilus (RISAQUAD) capsule 1 capsule  1 capsule Oral Daily Hessie Knows, MD   1 capsule at 04/23/22 0919   alum & mag hydroxide-simeth (MAALOX/MYLANTA) 200-200-20 MG/5ML suspension 30 mL  30 mL Oral Q4H PRN Hessie Knows, MD       ascorbic acid (VITAMIN C) tablet 1,000 mg  1,000 mg Oral Daily Hessie Knows, MD   1,000 mg at 04/23/22 0919   aspirin EC tablet 81 mg  81 mg Oral  Daily Hessie Knows, MD   81 mg at 04/23/22 0919   atorvastatin (LIPITOR) tablet 20 mg  20 mg Oral QHS Hessie Knows, MD   20 mg at 04/22/22 2154   bisacodyl (DULCOLAX) EC tablet 5 mg  5 mg Oral Daily PRN Hessie Knows, MD   5 mg at 04/23/22 1159   busPIRone (BUSPAR) tablet 7.5 mg  7.5 mg Oral BID Hessie Knows, MD   7.5 mg at 04/23/22 0920   [START ON 04/24/2022] chlorthalidone (HYGROTON) tablet 25 mg  25 mg Oral Daily Duanne Guess, PA-C       cholecalciferol (VITAMIN D3) tablet 1,000 Units  1,000 Units Oral Daily Hessie Knows,  MD   1,000 Units at 04/23/22 0919   dapagliflozin propanediol (FARXIGA) tablet 10 mg  10 mg Oral QAC breakfast Hessie Knows, MD   10 mg at 04/23/22 0749   diphenhydrAMINE (BENADRYL) 12.5 MG/5ML elixir 12.5-25 mg  12.5-25 mg Oral Q4H PRN Hessie Knows, MD       docusate sodium (COLACE) capsule 100 mg  100 mg Oral BID Hessie Knows, MD   100 mg at 04/23/22 0919   enoxaparin (LOVENOX) injection 30 mg  30 mg Subcutaneous Q12H Hessie Knows, MD   30 mg at 04/23/22 0751   fenofibrate (TRICOR) tablet 145 mg  145 mg Oral QHS Hessie Knows, MD       finasteride (PROSCAR) tablet 5 mg  5 mg Oral Daily Hessie Knows, MD   5 mg at 04/23/22 0919   gabapentin (NEURONTIN) tablet 600 mg  600 mg Oral QID Hessie Knows, MD   600 mg at 04/23/22 1302   insulin aspart (novoLOG) injection 0-15 Units  0-15 Units Subcutaneous TID WC Hessie Knows, MD   3 Units at 04/23/22 1158   insulin glargine-yfgn (SEMGLEE) injection 66 Units  66 Units Subcutaneous QHS Hessie Knows, MD   66 Units at 04/22/22 2156   loratadine (CLARITIN) tablet 10 mg  10 mg Oral Daily Hessie Knows, MD   10 mg at 04/23/22 0919   [START ON 04/24/2022] losartan (COZAAR) tablet 50 mg  50 mg Oral Daily Duanne Guess, PA-C       magnesium hydroxide (MILK OF MAGNESIA) suspension 30 mL  30 mL Oral QHS Hessie Knows, MD   30 mL at 04/22/22 2155   menthol-cetylpyridinium (CEPACOL) lozenge 3 mg  1 lozenge Oral PRN Hessie Knows, MD       Or   phenol (CHLORASEPTIC) mouth spray 1 spray  1 spray Mouth/Throat PRN Hessie Knows, MD       metFORMIN (GLUCOPHAGE-XR) 24 hr tablet 500 mg  500 mg Oral BID WC Hessie Knows, MD   500 mg at 04/23/22 0750   methocarbamol (ROBAXIN) tablet 500 mg  500 mg Oral Q6H PRN Hessie Knows, MD       Or   methocarbamol (ROBAXIN) 500 mg in dextrose 5 % 50 mL IVPB  500 mg Intravenous Q6H PRN Hessie Knows, MD       metoCLOPramide (REGLAN) tablet 5-10 mg  5-10 mg Oral Q8H PRN Hessie Knows, MD       Or   metoCLOPramide (REGLAN)  injection 5-10 mg  5-10 mg Intravenous Q8H PRN Hessie Knows, MD       morphine (PF) 2 MG/ML injection 0.5-1 mg  0.5-1 mg Intravenous Q2H PRN Hessie Knows, MD   1 mg at 04/21/22 1315   multivitamin with minerals tablet 1 tablet  1 tablet Oral Daily Hessie Knows, MD  1 tablet at 04/23/22 0919   ondansetron (ZOFRAN) tablet 4 mg  4 mg Oral Q6H PRN Hessie Knows, MD       Or   ondansetron [facility]) injection 4 mg  4 mg Intravenous Q6H PRN Hessie Knows, MD       oxyCODONE (Oxy IR/ROXICODONE) immediate release tablet 10 mg  10 mg Oral Q3H PRN Hessie Knows, MD   10 mg at 04/23/22 1302   pantoprazole (PROTONIX) EC tablet 40 mg  40 mg Oral Daily Hessie Knows, MD   40 mg at 04/23/22 0919   pioglitazone (ACTOS) tablet 15 mg  15 mg Oral Daily Hessie Knows, MD   15 mg at 04/23/22 0920   polyethylene glycol (MIRALAX / GLYCOLAX) packet 17 g  17 g Oral Daily PRN Hessie Knows, MD   17 g at 04/22/22 1739   sodium phosphate (FLEET) 7-19 GM/118ML enema 1 enema  1 enema Rectal Once PRN Hessie Knows, MD       tamsulosin [facility]) capsule 0.4 mg  0.4 mg Oral QPC supper Hessie Knows, MD   0.4 mg at 04/22/22 1707   traMADol (ULTRAM) tablet 50 mg  50 mg Oral Q6H Hessie Knows, MD   50 mg at 04/23/22 1159   traZODone (DESYREL) tablet 100 mg  100 mg Oral QHS Hessie Knows, MD   100 mg at 04/22/22 2155   venlafaxine XR (EFFEXOR-XR) 24 hr capsule 150 mg  150 mg Oral Q breakfast Hessie Knows, MD   150 mg at 04/23/22 0750   zolpidem (AMBIEN) tablet 5 mg  5 mg Oral QHS PRN Hessie Knows, MD         Discharge Medications: Please see discharge summary for a list of discharge medications.  Relevant Imaging Results:  Relevant Lab Results:   Additional Information SS# [id number]  Conception Oms, RN

## 2022-04-23 NOTE — Progress Notes (Signed)
   Subjective: 2 Days Post-Op Procedure(s) (LRB): TOTAL KNEE ARTHROPLASTY (Left) Patient reports pain as moderate.   Patient is well, and has had no acute complaints or problems Denies any CP, SOB, ABD pain. We will continue therapy today.  Plan is to go Home after hospital stay.  Objective: Vital signs in last 24 hours: Temp:  [98.2 F (36.8 C)-99.8 F (37.7 C)] 99.2 F (37.3 C) (05/18 0439) Pulse Rate:  [68-87] 68 (05/18 0447) Resp:  [14-20] 18 (05/18 0439) BP: (99-128)/(48-69) 109/63 (05/18 0447) SpO2:  [97 %-99 %] 97 % (05/18 0439)  Intake/Output from previous day: 05/17 0701 - 05/18 0700 In: 720 [P.O.:720] Out: 1250 [Urine:1250] Intake/Output this shift: No intake/output data recorded.  Recent Labs    04/21/22 1132  HGB 14.6   Recent Labs    04/21/22 1132  WBC 7.2  RBC 5.24  HCT 45.4  PLT 152   Recent Labs    04/21/22 1132  CREATININE 1.12   No results for input(s): LABPT, INR in the last 72 hours.  EXAM General - Patient is Alert, Appropriate, and Oriented Extremity - Neurovascular intact Sensation intact distally Intact pulses distally Dorsiflexion/Plantar flexion intact Dressing - dressing C/D/I and no drainage, proevena intact without drainage Motor Function - intact, moving foot and toes well on exam.   Past Medical History:  Diagnosis Date   ADD (attention deficit disorder)    Anemia    Anxiety    Arthritis    Depression    Diabetes mellitus without complication (HCC)    Enlarged prostate    GERD (gastroesophageal reflux disease)    History of kidney stones    Hypertension    Knee pain    Neuromuscular disorder (HCC)    lower legs- neuropathy   Sleep apnea     Assessment/Plan:   2 Days Post-Op Procedure(s) (LRB): TOTAL KNEE ARTHROPLASTY (Left) Principal Problem:   S/P TKR (total knee replacement) using cement, left  Estimated body mass index is 43.87 kg/m as calculated from the following:   Height as of this encounter: 5'  10" (1.778 m).   Weight as of this encounter: 138.7 kg. Advance diet Up with therapy Vital signs are stable, BP soft. Hold BP meds this am Pain is moderate, continue with current pain regimen.  Care management to assist with discharge to home with home health PT pending completion of PT goals.  DVT Prophylaxis - TED hose and SCDs, Lovenox Weight-Bearing as tolerated to left leg   T. Rachelle Hora, PA-C [place] 04/23/2022, 7:45 AM

## 2022-04-23 NOTE — Progress Notes (Signed)
Occupational Therapy Treatment Patient Details Name: Eric Andrews MRN: [id number] DOB: March 17, 1951 Today's Date: 04/23/2022   History of present illness Pt is a 71 y.o. male s/p L TKA secondary to primary localized OA 5/16.  PMH includes B LE neuropathy, sleep apnea with CPAP, htn, anxiety, and DM.   OT comments  Pt seen for OT treatment on this date. Upon arrival to room pt awake and alert. Pt seated upright in chair. Pt agreeable to tx and requested to lay in bed. Pt required CGA for STS and reported 10/10 pain in L knee while standing. Pt instructed in donning/doffing polar care and how to use bed sheet as leg lifter for sit to supine t/f. Pt required CGA + RW for chair to bed t/f and MIN cues don/doff polar care in sitting. Pt verbalized understanding of instruction provided. Pt making good progress toward goals. Pt continues to benefit from skilled OT services to maximize return to PLOF and minimize risk of future falls, injury, caregiver burden, and readmission. Will continue to follow POC. Discharge recommendation remains appropriate.     Recommendations for follow up therapy are one component of a multi-disciplinary discharge planning process, led by the attending physician.  Recommendations may be updated based on patient status, additional functional criteria and insurance authorization.    Follow Up Recommendations  Home health OT    Assistance Recommended at Discharge Set up Supervision/Assistance  Patient can return home with the following  A little help with walking and/or transfers;A little help with bathing/dressing/bathroom   Equipment Recommendations  BSC/3in1    Recommendations for Other Services      Precautions / Restrictions Precautions Precautions: Knee;Fall Precaution Booklet Issued: Yes (comment) Restrictions Weight Bearing Restrictions: Yes LLE Weight Bearing: Weight bearing as tolerated       Mobility Bed Mobility Overal bed mobility: Needs Assistance Bed  Mobility: Sit to Supine       Sit to supine: Supervision   General bed mobility comments: Pt required verbal cues and bed sheet to lift LLE into bed    Transfers Overall transfer level: Needs assistance Equipment used: Rolling walker (2 wheels) Transfers: Bed to chair/wheelchair/BSC, Sit to/from Stand Sit to Stand: Min guard     Step pivot transfers: Min guard           Balance Overall balance assessment: Needs assistance Sitting-balance support: Feet supported Sitting balance-Leahy Scale: Good     Standing balance support: Bilateral upper extremity supported Standing balance-Leahy Scale: Good                             ADL either performed or assessed with clinical judgement   ADL Overall ADL's : Needs assistance/impaired                                       General ADL Comments: CGA + RW for chair to bed t/f. MIN cues don/doff polar care in sitting.     Cognition Arousal/Alertness: Awake/alert Behavior During Therapy: WFL for tasks assessed/performed Overall Cognitive Status: Within Functional Limits for tasks assessed                                                General Comments Reviewed how to  use the ice machine    Pertinent Vitals/ Pain       Pain Assessment Pain Assessment: 0-10 Pain Score: 10-Worst pain ever Pain Location: L knee Pain Descriptors / Indicators: Aching, Discomfort, Grimacing Pain Intervention(s): Limited activity within patient's tolerance, Repositioned, Ice applied   Frequency  Min 2X/week        Progress Toward Goals  OT Goals(current goals can now be found in the care plan section)  Progress towards OT goals: Progressing toward goals  Acute Rehab OT Goals Patient Stated Goal: to improve pain OT Goal Formulation: With patient Time For Goal Achievement: 05/06/22 Potential to Achieve Goals: Good ADL Goals Pt Will Perform Grooming: Independently;standing Pt Will  Perform Lower Body Dressing: with modified independence;sit to/from stand Pt Will Transfer to Toilet: with modified independence;ambulating;regular height toilet  Plan Discharge plan remains appropriate;Frequency remains appropriate       AM-PAC OT "6 Clicks" Daily Activity     Outcome Measure   Help from another person eating meals?: None Help from another person taking care of personal grooming?: A Little Help from another person toileting, which includes using toliet, bedpan, or urinal?: A Little Help from another person bathing (including washing, rinsing, drying)?: A Little Help from another person to put on and taking off regular upper body clothing?: None Help from another person to put on and taking off regular lower body clothing?: A Little 6 Click Score: 20    End of Session Equipment Utilized During Treatment: Rolling walker (2 wheels)  OT Visit Diagnosis: Other abnormalities of gait and mobility (R26.89)   Activity Tolerance Patient tolerated treatment well   Patient Left in bed;with call bell/phone within reach   Nurse Communication          Time: 0263-7858 OT Time Calculation (min): 14 min  Charges: OT General Charges $OT Visit: 1 Visit OT Treatments $Self Care/Home Management : 8-22 mins  [employer], OTDS  [employer] 04/23/2022, 4:17 PM

## 2022-04-23 NOTE — Progress Notes (Signed)
Physical Therapy Treatment Patient Details Name: Eric Andrews MRN: [id number] DOB: [birth year] Today's Date: 04/23/2022   History of Present Illness Pt is a 71 y.o. male s/p L TKA secondary to primary localized OA 5/16.  PMH includes B LE neuropathy, sleep apnea with CPAP, htn, anxiety, and DM.    PT Comments    Pt was sitting in recliner upon arriving. He is A and O x 4. Agrees to session and is cooperative throughout. Pt continues to endorse pain however he said it was slightly better than earlier. He was able to progress ambulation and required less assistance with transfers. Overall pt has been pain limited throughout admission but does seem to be progressing and tolerating more. Discussed importance of there ex and performance of ROM. He states understanding. Overall pt is progressing. He is hopeful to continue to progress quick enough to be able to DC directly home from hospital. PT will continue efforts to progress pt to maximal independence.    Recommendations for follow up therapy are one component of a multi-disciplinary discharge planning process, led by the attending physician.  Recommendations may be updated based on patient status, additional functional criteria and insurance authorization.  Follow Up Recommendations  Follow physician's recommendations for discharge plan and follow up therapies (pt has ramp entry to home and w/c." I'll have alot of help at home." Pt does not want to got to rehab. PT will continue to progress pt to maximial independence.)     Assistance Recommended at Discharge Intermittent Supervision/Assistance  Patient can return home with the following A little help with walking and/or transfers;A lot of help with bathing/dressing/bathroom;Assistance with cooking/housework;Direct supervision/assist for medications management;Direct supervision/assist for financial management;Assist for transportation;Help with stairs or ramp for entrance   Equipment  Recommendations  None recommended by PT (pt has recieved personal equipment)       Precautions / Restrictions Precautions Precautions: Knee;Fall Precaution Booklet Issued: Yes (comment) Restrictions Weight Bearing Restrictions: Yes LLE Weight Bearing: Weight bearing as tolerated     Mobility  Bed Mobility  General bed mobility comments: Pt was in recliner pre/post session    Transfers Overall transfer level: Needs assistance Equipment used: Rolling walker (2 wheels) Transfers: Sit to/from Stand Sit to Stand: Min guard    General transfer comment: CGA to STS to/from recliner. No Vcs required, pt did demonstarte proper performance. Takes increased time due to pain    Ambulation/Gait Ambulation/Gait assistance: Min guard, Supervision Gait Distance (Feet): 80 Feet Assistive device: Rolling walker (2 wheels) Gait Pattern/deviations: Step-to pattern, Antalgic, Trunk flexed Gait velocity: decreased     General Gait Details: Pt was able to ambulate 80 ft with RW. Continues to be pain limited but was able to progress distances. " It hurts a little less."    Balance Overall balance assessment: Needs assistance Sitting-balance support: Feet supported Sitting balance-Leahy Scale: Good     Standing balance support: Bilateral upper extremity supported Standing balance-Leahy Scale: Fair Standing balance comment: reliant on RW + UE support due to pain more so than balance deficits    Cognition Arousal/Alertness: Awake/alert Behavior During Therapy: WFL for tasks assessed/performed Overall Cognitive Status: Within Functional Limits for tasks assessed      General Comments: Pt is A and O x 4        Exercises Total Joint Exercises Goniometric ROM: 4-78    General Comments General comments (skin integrity, edema, etc.): reviewed importance of performing ther  ex and strengthening      Pertinent Vitals/Pain Pain  Assessment Pain Assessment: 0-10 Pain Score: 4  Pain  Location: L knee - pain increases excessively with WBing and ROM Pain Descriptors / Indicators: Aching, Tender, Sore Pain Intervention(s): Limited activity within patient's tolerance, Monitored during session, Premedicated before session, Repositioned, Ice applied     PT Goals (current goals can now be found in the care plan section) Acute Rehab PT Goals Patient Stated Goal: to be able to walk Progress towards PT goals: Progressing toward goals    Frequency    BID      PT Plan Discharge plan needs to be updated       AM-PAC PT "6 Clicks" Mobility   Outcome Measure  Help needed turning from your back to your side while in a flat bed without using bedrails?: A Little Help needed moving from lying on your back to sitting on the side of a flat bed without using bedrails?: A Little Help needed moving to and from a bed to a chair (including a wheelchair)?: A Little Help needed standing up from a chair using your arms (e.g., wheelchair or bedside chair)?: A Little Help needed to walk in hospital room?: A Little Help needed climbing 3-5 steps with a railing? : Total 6 Click Score: 16    End of Session Equipment Utilized During Treatment: Gait belt Activity Tolerance: Patient tolerated treatment well;Patient limited by pain Patient left: in chair;with call bell/phone within reach;with chair alarm set Nurse Communication: Mobility status PT Visit Diagnosis: Other abnormalities of gait and mobility (R26.89);Muscle weakness (generalized) (M62.81);Pain Pain - Right/Left: Left Pain - part of body: Knee     Time: 1420-1453 PT Time Calculation (min) (ACUTE ONLY): 33 min  Charges:  $Gait Training: 8-22 mins $Therapeutic Exercise: 8-22 mins $Therapeutic Activity: 8-22 mins                    Julaine Fusi PTA 04/23/22, 3:08 PM

## 2022-04-23 NOTE — Progress Notes (Signed)
Physical Therapy Treatment Patient Details Name: Eric Andrews MRN: [id number] DOB: Aug 20, 1951 Today's Date: 04/23/2022   History of Present Illness Pt is a 72 y.o. male s/p L TKA secondary to primary localized OA 5/16.  PMH includes B LE neuropathy, sleep apnea with CPAP, htn, anxiety, and DM.    PT Comments    Pt continues to be pleasant and motivated but struggles severely with pain t/o the session.  He struggled with essentially all aspects of session including ROM, WBing, ambulation, and all against gravity/resisted activities.  He did manage to ambulate 25 ft with very slow, labored, UE reliant effort.  No buckling in L knee but unable to attain TKE and clearly needing UEs on walker during L WBing with L decreased stance time/limp.  Pt's BP 110s/60s in multiple positions with no symptomatic dizziness/etc t/o the session.  Pt was hoping to go home at d/c, but has struggled with all functional mobility.  Recommendations for follow up therapy are one component of a multi-disciplinary discharge planning process, led by the attending physician.  Recommendations may be updated based on patient status, additional functional criteria and insurance authorization.  Follow Up Recommendations  Skilled nursing-short term rehab (<3 hours/day) (per progress and MD recs)     Assistance Recommended at Discharge Intermittent Supervision/Assistance  Patient can return home with the following A lot of help with walking and/or transfers;A lot of help with bathing/dressing/bathroom;Assistance with cooking/housework;Assistance with feeding;Assist for transportation;Help with stairs or ramp for entrance   Equipment Recommendations  Rolling walker (2 wheels) (bari)    Recommendations for Other Services       Precautions / Restrictions Precautions Precautions: Knee;Fall Restrictions LLE Weight Bearing: Weight bearing as tolerated     Mobility  Bed Mobility Overal bed mobility: Needs Assistance Bed  Mobility: Supine to Sit     Supine to sit: Min assist     General bed mobility comments: Pt made a great effort with getting LEs out of bed, but struggled to complete the effort without direct assist.  Assist to lift trunk in getting to EOB with rail use.    Transfers Overall transfer level: Needs assistance Equipment used: Rolling walker (2 wheels) Transfers: Sit to/from Stand Sit to Stand: Min assist           General transfer comment: Unable to rise from standard height bed, raised bed ~3" and further encouraged appropriate UE use.  Even with plenty of set up, cuing and encouragment he needed direct assist to keep weight shifting forward and ultimately attain standing    Ambulation/Gait Ambulation/Gait assistance: Min guard Gait Distance (Feet): 25 Feet Assistive device: Rolling walker (2 wheels)         General Gait Details: Extremely slow and labored bout of ambulation, great effort but simply unable to confidently accept much weight on the L and lacks TKE making him very reliant (and therefore quick to fatigue) in the UEs using the walker.  Pt c/o pain and fatigue   Stairs             Wheelchair Mobility    Modified Rankin (Stroke Patients Only)       Balance Overall balance assessment: Needs assistance Sitting-balance support: Feet supported Sitting balance-Leahy Scale: Good     Standing balance support: Bilateral upper extremity supported Standing balance-Leahy Scale: Fair Standing balance comment: reliant on RW + UE support due to pain more so than balance deficits  Cognition Arousal/Alertness: Awake/alert Behavior During Therapy: WFL for tasks assessed/performed Overall Cognitive Status: Within Functional Limits for tasks assessed                                          Exercises Total Joint Exercises Ankle Circles/Pumps: AROM, 10 reps Quad Sets: Strengthening, 10 reps Short Arc  Quad: PROM, AAROM, 10 reps (pt unable to hold against gravity at all, very poor quad engagement) Heel Slides: AAROM, 10 reps Hip ABduction/ADduction: 10 reps, Strengthening Straight Leg Raises: PROM, 5 reps (pt unable to tolerate) Long Arc Quad: AAROM, 5 reps Knee Flexion: PROM, 5 reps Goniometric ROM: 5-70    General Comments        Pertinent Vitals/Pain Pain Assessment Pain Score: 3  Pain Location: L knee - pain increases excessively with WBing and ROM Pain Intervention(s): Limited activity within patient's tolerance, Patient requesting pain meds-RN notified    Home Living                          Prior Function            PT Goals (current goals can now be found in the care plan section) Progress towards PT goals: Progressing toward goals (slow progress)    Frequency    BID      PT Plan Discharge plan needs to be updated    Co-evaluation              AM-PAC PT "6 Clicks" Mobility   Outcome Measure  Help needed turning from your back to your side while in a flat bed without using bedrails?: A Little Help needed moving from lying on your back to sitting on the side of a flat bed without using bedrails?: A Little Help needed moving to and from a bed to a chair (including a wheelchair)?: A Little Help needed standing up from a chair using your arms (e.g., wheelchair or bedside chair)?: A Lot Help needed to walk in hospital room?: A Little Help needed climbing 3-5 steps with a railing? : Total 6 Click Score: 15    End of Session Equipment Utilized During Treatment: Gait belt Activity Tolerance: Patient limited by pain Patient left: with bed alarm set;with call bell/phone within reach Nurse Communication: Mobility status;Patient requests pain meds PT Visit Diagnosis: Other abnormalities of gait and mobility (R26.89);Muscle weakness (generalized) (M62.81);Pain Pain - Right/Left: Left Pain - part of body: Knee     Time: 1045-1130 PT Time  Calculation (min) (ACUTE ONLY): 45 min  Charges:  $Gait Training: 8-22 mins $Therapeutic Exercise: 8-22 mins $Therapeutic Activity: 8-22 mins                     Kreg Shropshire, DPT 04/23/2022, 2:40 PM

## 2022-04-23 NOTE — TOC Progression Note (Signed)
Transition of Care [facility]) - Progression Note    Patient Details  Name: Asaph Serena MRN: [id number] Date of Birth: November 12, 1951  [facility]) CM/SW [place], RN Phone Number: 04/23/2022, 2:00 PM  Clinical Narrative:     Patient will likely need to go to STR SNF Agreeable to bedsearch  PASSR obtained, Fl2 completed, Bedsearch sent   Expected Discharge Plan: [place] Barriers to Discharge: Continued Medical Work up  Expected Discharge Plan and Services Expected Discharge Plan: [employer]   Discharge Planning Services: CM Consult   Living arrangements for the past 2 months: Single Family Home                 DME Arranged: Gilford Rile rolling DME Agency: [employer] Date DME Agency Contacted: 04/22/22 Time DME Agency Contacted: [phone] Representative spoke with at DME Agency: intake HH Arranged: PT [place]: [place] Date [place]: 04/22/22 Time [place]: [phone] Representative spoke with at [place]: [place] Determinants of Health ([place]) Interventions    Readmission Risk Interventions     View : No data to display.

## 2022-04-24 LAB — GLUCOSE, CAPILLARY
Glucose-Capillary: 173 mg/dL — ABNORMAL HIGH (ref 70–99)
Glucose-Capillary: 205 mg/dL — ABNORMAL HIGH (ref 70–99)

## 2022-04-24 MED ORDER — OXYCODONE HCL 10 MG PO TABS: 10.0000 mg | ORAL_TABLET | ORAL | 0 refills | Status: DC | PRN

## 2022-04-24 MED ORDER — MAGNESIUM HYDROXIDE 400 MG/5ML PO SUSP
30.0000 mL | Freq: Once | ORAL | Status: AC
  Administered 2022-04-24: 30 mL via ORAL
  Filled 2022-04-24: qty 30

## 2022-04-24 MED ORDER — TRAMADOL HCL 50 MG PO TABS: 50.0000 mg | ORAL_TABLET | Freq: Four times a day (QID) | ORAL | 0 refills | Status: DC | PRN

## 2022-04-24 NOTE — Progress Notes (Signed)
Discharge instructions reviewed with pt.and spouse. The both voiced understanding of instructions. Discharge packet given to patient

## 2022-04-24 NOTE — Plan of Care (Signed)
Patient discharged per MD orders at this time.All discharge instructions,education and medications reviewed with the patient.Pt expressed understanding and will comply with dc instructions.follow up appointments was also communicated to the patient.no verbal c/o or any ssx of distress.Pt was discharged home with HH/PT services per order.Pt was transported home by spouse in a privately owned vehicle. 

## 2022-04-24 NOTE — Progress Notes (Addendum)
   Subjective: 3 Days Post-Op Procedure(s) (LRB): TOTAL KNEE ARTHROPLASTY (Left) Patient reports pain as moderate  Patient is well, and has had no acute complaints or problems Denies any CP, SOB, ABD pain. We will continue therapy today.  Slow progress of physical therapy Plan is to go Skilled nursing facility after hospital stay.  Objective: Vital signs in last 24 hours: Temp:  [98.8 F (37.1 C)-99.2 F (37.3 C)] 99.1 F (37.3 C) (05/19 0452) Pulse Rate:  [50-101] 50 (05/19 0452) Resp:  [16-18] 18 (05/19 0452) BP: (104-153)/(66-95) 130/77 (05/19 0452) SpO2:  [96 %-100 %] 97 % (05/19 0452)  Intake/Output from previous day: 05/18 0701 - 05/19 0700 In: 360 [P.O.:360] Out: -  Intake/Output this shift: No intake/output data recorded.  Recent Labs    04/21/22 1132  HGB 14.6   Recent Labs    04/21/22 1132  WBC 7.2  RBC 5.24  HCT 45.4  PLT 152   Recent Labs    04/21/22 1132  CREATININE 1.12   No results for input(s): LABPT, INR in the last 72 hours.  EXAM General - Patient is Alert, Appropriate, and Oriented Extremity - Neurovascular intact Sensation intact distally Intact pulses distally Dorsiflexion/Plantar flexion intact Dressing - dressing C/D/I and no drainage, proevena intact without drainage Motor Function - intact, moving foot and toes well on exam.   Past Medical History:  Diagnosis Date   ADD (attention deficit disorder)    Anemia    Anxiety    Arthritis    Depression    Diabetes mellitus without complication (HCC)    Enlarged prostate    GERD (gastroesophageal reflux disease)    History of kidney stones    Hypertension    Knee pain    Neuromuscular disorder (HCC)    lower legs- neuropathy   Sleep apnea     Assessment/Plan:   3 Days Post-Op Procedure(s) (LRB): TOTAL KNEE ARTHROPLASTY (Left) Principal Problem:   S/P TKR (total knee replacement) using cement, left  Estimated body mass index is 43.87 kg/m as calculated from the  following:   Height as of this encounter: '5\' 10"'$  (1.778 m).   Weight as of this encounter: 138.7 kg. Advance diet Up with therapy Vital signs are stable Pain is moderate, continue with current pain regimen.  Care management to assist with discharge.  Patient with good progress of physical therapy today.  Will discharge home with home health PT  DVT Prophylaxis - TED hose and SCDs, Lovenox Weight-Bearing as tolerated to left leg   T. Rachelle Hora, PA-C [place] 04/24/2022, 7:22 AM

## 2022-04-24 NOTE — Progress Notes (Addendum)
Physical Therapy Treatment Patient Details Name: Eric Andrews MRN: [id number] DOB: 05-Feb-1951 Today's Date: 04/24/2022   History of Present Illness Pt is a 71 y.o. male s/p L TKA secondary to primary localized OA 5/16.  PMH includes B LE neuropathy, sleep apnea with CPAP, htn, anxiety, and DM.    PT Comments    Pt was long sitting in bed with supportive spouse at bedside. CM entered room with author to discuss DC disposition. Pt continues to not want to go home and feels he/family can safely manage at home with HHPT. Chief Strategy Officer discussed that he is not moving as well as he should be by this point after surgery. He remained set on going home at DC. Spouse did witness the amount of assistance he required throughout session and feels that she and daughters will be able to provide that level of care. He required min assist + use of bed rails to exit bed. Does endorse 3/10 pain with ambulation/wt bearing. He stood and ambulated to BR but has extremely slow, antalgic, step to gait pattern. Session greatly limited by pt needing to have BM. Author will return later this date to advance ROM, strength, and gait. He does have equipment needs met. Acute PT will continue to follow per current POC.   Author returned after pt had BM and he was able to stand and ambulate 100 ft with RW. Continues to have extremely slow, antalgic gait pattern. PT will continue to progress pt as able per current POC.     Recommendations for follow up therapy are one component of a multi-disciplinary discharge planning process, led by the attending physician.  Recommendations may be updated based on patient status, additional functional criteria and insurance authorization.  Follow Up Recommendations  Follow physician's recommendations for discharge plan and follow up therapies (Pt continues to feel that he will be able to safely manage at home versus needing to go to rehab.)     Assistance Recommended at Discharge Frequent or constant  Supervision/Assistance  Patient can return home with the following A little help with walking and/or transfers;A lot of help with bathing/dressing/bathroom;Assistance with cooking/housework;Direct supervision/assist for medications management;Direct supervision/assist for financial management;Assist for transportation;Help with stairs or ramp for entrance   Equipment Recommendations  None recommended by PT       Precautions / Restrictions Precautions Precautions: Knee;Fall Precaution Booklet Issued: Yes (comment) Restrictions Weight Bearing Restrictions: Yes LLE Weight Bearing: Weight bearing as tolerated     Mobility  Bed Mobility Overal bed mobility: Needs Assistance Bed Mobility: Supine to Sit  Supine to sit: Min assist  General bed mobility comments: pt required min assist to exit bed. Tried to simulate home environment    Transfers Overall transfer level: Needs assistance Equipment used: Rolling walker (2 wheels) Transfers: Sit to/from Stand Sit to Stand: Min guard, Min assist  General transfer comment: CGA for safety to from slightly elevated bed height, min assist to stand from lower toilet surface height    Ambulation/Gait Ambulation/Gait assistance: Min guard, Supervision Gait Distance (Feet): 50 Feet/ 100 ft  Assistive device: Rolling walker (2 wheels) Gait Pattern/deviations: Step-to pattern, Antalgic, Trunk flexed Gait velocity: decreased     General Gait Details: pt ambulates with extremely slow antalgic step to gait pattern.    Balance Overall balance assessment: Needs assistance Sitting-balance support: Feet supported Sitting balance-Leahy Scale: Good     Standing balance support: Bilateral upper extremity supported Standing balance-Leahy Scale: Fair Standing balance comment: reliant on RW + UE support due  to pain more so than balance deficits       Cognition Arousal/Alertness: Awake/alert Behavior During Therapy: WFL for tasks  assessed/performed Overall Cognitive Status: Within Functional Limits for tasks assessed      General Comments: Pt is A and O x 4           General Comments General comments (skin integrity, edema, etc.): reviewed importance of stretching and ther ex to promote maximal ROM/strength in operative LE. will address in PM session.      Pertinent Vitals/Pain Pain Assessment Pain Assessment: 0-10 Pain Score: 3  Pain Location: L knee Pain Descriptors / Indicators: Aching, Discomfort, Grimacing Pain Intervention(s): Limited activity within patient's tolerance, Monitored during session, Premedicated before session, Repositioned, Ice applied     PT Goals (current goals can now be found in the care plan section) Acute Rehab PT Goals Patient Stated Goal: go home when Im ready Progress towards PT goals: Progressing toward goals    Frequency    BID      PT Plan Other (comment);Current plan remains appropriate       AM-PAC PT "6 Clicks" Mobility   Outcome Measure  Help needed turning from your back to your side while in a flat bed without using bedrails?: A Little Help needed moving from lying on your back to sitting on the side of a flat bed without using bedrails?: A Little Help needed moving to and from a bed to a chair (including a wheelchair)?: A Little Help needed standing up from a chair using your arms (e.g., wheelchair or bedside chair)?: A Little Help needed to walk in hospital room?: A Little Help needed climbing 3-5 steps with a railing? : A Lot 6 Click Score: 17    End of Session   Activity Tolerance: Patient tolerated treatment well Patient left: Other (comment) (in BR for BM with RN tech present) Nurse Communication: Mobility status PT Visit Diagnosis: Other abnormalities of gait and mobility (R26.89);Muscle weakness (generalized) (M62.81);Pain Pain - Right/Left: Left Pain - part of body: Knee     Time: 1040-1120 PT Time Calculation (min) (ACUTE ONLY): 40  min  Charges:  $Gait Training: 23-37 mins $Therapeutic Activity: 8-22 mins                     Julaine Fusi PTA 04/24/22, 11:48 AM

## 2022-04-25 DIAGNOSIS — M150 Primary generalized (osteo)arthritis: Secondary | ICD-10-CM | POA: Diagnosis not present

## 2022-04-25 DIAGNOSIS — G8929 Other chronic pain: Secondary | ICD-10-CM | POA: Diagnosis not present

## 2022-04-25 DIAGNOSIS — D649 Anemia, unspecified: Secondary | ICD-10-CM | POA: Diagnosis not present

## 2022-04-25 DIAGNOSIS — Z471 Aftercare following joint replacement surgery: Secondary | ICD-10-CM | POA: Diagnosis not present

## 2022-04-25 DIAGNOSIS — Z96652 Presence of left artificial knee joint: Secondary | ICD-10-CM | POA: Diagnosis not present

## 2022-04-27 DIAGNOSIS — D649 Anemia, unspecified: Secondary | ICD-10-CM | POA: Diagnosis not present

## 2022-04-27 DIAGNOSIS — Z96652 Presence of left artificial knee joint: Secondary | ICD-10-CM | POA: Diagnosis not present

## 2022-04-27 DIAGNOSIS — M150 Primary generalized (osteo)arthritis: Secondary | ICD-10-CM | POA: Diagnosis not present

## 2022-04-27 DIAGNOSIS — Z471 Aftercare following joint replacement surgery: Secondary | ICD-10-CM | POA: Diagnosis not present

## 2022-04-27 DIAGNOSIS — G8929 Other chronic pain: Secondary | ICD-10-CM | POA: Diagnosis not present

## 2022-04-29 DIAGNOSIS — M150 Primary generalized (osteo)arthritis: Secondary | ICD-10-CM | POA: Diagnosis not present

## 2022-04-29 DIAGNOSIS — Z471 Aftercare following joint replacement surgery: Secondary | ICD-10-CM | POA: Diagnosis not present

## 2022-04-29 DIAGNOSIS — D649 Anemia, unspecified: Secondary | ICD-10-CM | POA: Diagnosis not present

## 2022-04-29 DIAGNOSIS — Z96652 Presence of left artificial knee joint: Secondary | ICD-10-CM | POA: Diagnosis not present

## 2022-04-29 DIAGNOSIS — G8929 Other chronic pain: Secondary | ICD-10-CM | POA: Diagnosis not present

## 2022-05-01 ENCOUNTER — Other Ambulatory Visit: Payer: Self-pay | Admitting: Family Medicine

## 2022-05-01 DIAGNOSIS — Z471 Aftercare following joint replacement surgery: Secondary | ICD-10-CM | POA: Diagnosis not present

## 2022-05-01 DIAGNOSIS — E1169 Type 2 diabetes mellitus with other specified complication: Secondary | ICD-10-CM

## 2022-05-01 DIAGNOSIS — Z96652 Presence of left artificial knee joint: Secondary | ICD-10-CM | POA: Diagnosis not present

## 2022-05-01 DIAGNOSIS — D649 Anemia, unspecified: Secondary | ICD-10-CM | POA: Diagnosis not present

## 2022-05-01 DIAGNOSIS — M150 Primary generalized (osteo)arthritis: Secondary | ICD-10-CM | POA: Diagnosis not present

## 2022-05-01 DIAGNOSIS — I10 Essential (primary) hypertension: Secondary | ICD-10-CM

## 2022-05-01 DIAGNOSIS — Z794 Long term (current) use of insulin: Secondary | ICD-10-CM

## 2022-05-01 DIAGNOSIS — G8929 Other chronic pain: Secondary | ICD-10-CM | POA: Diagnosis not present

## 2022-05-04 DIAGNOSIS — D649 Anemia, unspecified: Secondary | ICD-10-CM | POA: Diagnosis not present

## 2022-05-04 DIAGNOSIS — G8929 Other chronic pain: Secondary | ICD-10-CM | POA: Diagnosis not present

## 2022-05-04 DIAGNOSIS — Z96652 Presence of left artificial knee joint: Secondary | ICD-10-CM | POA: Diagnosis not present

## 2022-05-04 DIAGNOSIS — M150 Primary generalized (osteo)arthritis: Secondary | ICD-10-CM | POA: Diagnosis not present

## 2022-05-04 DIAGNOSIS — Z471 Aftercare following joint replacement surgery: Secondary | ICD-10-CM | POA: Diagnosis not present

## 2022-05-05 ENCOUNTER — Other Ambulatory Visit: Payer: Self-pay | Admitting: Family Medicine

## 2022-05-05 DIAGNOSIS — E1169 Type 2 diabetes mellitus with other specified complication: Secondary | ICD-10-CM

## 2022-05-05 NOTE — Telephone Encounter (Signed)
Requested Prescriptions  Pending Prescriptions Disp Refills  . pioglitazone (ACTOS) 15 MG tablet [Pharmacy Med Name: PIOGLITAZONE 15MG TABLETS] 90 tablet 0    Sig: TAKE 1 TABLET(15 MG) BY MOUTH DAILY     Endocrinology:  Diabetes - Glitazones - pioglitazone Failed - 05/01/2022  9:56 AM      Failed - HBA1C is between 0 and 7.9 and within 180 days    Hgb A1c MFr Bld  Date Value Ref Range Status  07/01/2021 8.5 (H) <5.7 % of total Hgb Final    Comment:    For someone without known diabetes, a hemoglobin A1c value of 6.5% or greater indicates that they may have  diabetes and this should be confirmed with a follow-up  test. . For someone with known diabetes, a value <7% indicates  that their diabetes is well controlled and a value  greater than or equal to 7% indicates suboptimal  control. A1c targets should be individualized based on  duration of diabetes, age, comorbid conditions, and  other considerations. . Currently, no consensus exists regarding use of hemoglobin A1c for diagnosis of diabetes for children. Renella Cunas - Valid encounter within last 6 months    Recent Outpatient Visits          4 months ago Chronic pain of both knees   [facility] [place], [name], DO   9 months ago Intestinal infection due to enteropathogenic E. coli   [facility] [place], Coralie Keens, NP   10 months ago Annual physical exam   [facility], [name], DO   1 year ago [facility] [place], [name], DO   1 year ago Type 2 diabetes mellitus with other specified complication, with long-term current use of insulin ([place])   [place], [name], DO      Future Appointments            Tomorrow Duanne Guess, [facility]   In 2 months  [place], [place]   In 11 months Ralene Bathe, MD            . losartan (COZAAR)  50 MG tablet [Pharmacy Med Name: LOSARTAN 50MG TABLETS] 90 tablet 0    Sig: TAKE 1 TABLET(50 MG) BY MOUTH DAILY     Cardiovascular:  Angiotensin Receptor Blockers Passed - 05/01/2022  9:56 AM      Passed - Cr in normal range and within 180 days    Creat  Date Value Ref Range Status  07/01/2021 1.12 0.70 - 1.35 mg/dL Final   Creatinine, Ser  Date Value Ref Range Status  04/21/2022 1.12 0.61 - 1.24 mg/dL Final         Passed - K in normal range and within 180 days    Potassium  Date Value Ref Range Status  04/10/2022 3.7 3.5 - 5.1 mmol/L Final         Passed - Patient is not pregnant      Passed - Last BP in normal range    BP Readings from Last 1 Encounters:  04/24/22 138/82         Passed - Valid encounter within last 6 months    Recent Outpatient Visits          4 months ago Chronic pain of both knees   [place],  [name], DO   9 months ago Intestinal infection due to enteropathogenic E. coli   [facility], Coralie Keens, NP   10 months ago Annual physical exam   [facility] [place], [name], DO   1 year ago [facility] [place], [name], DO   1 year ago Type 2 diabetes mellitus with other specified complication, with long-term current use of insulin ([place])   [place], [name], DO      Future Appointments            Tomorrow Duanne Guess, [facility]   In 2 months  [place], [place]   In 11 months Ralene Bathe, MD            . metFORMIN (GLUCOPHAGE-XR) 500 MG 24 hr tablet [Pharmacy Med Name: METFORMIN ER 500MG 24HR TABS] 120 tablet 0    Sig: TAKE 2 TABLETS(1000 MG) BY MOUTH TWICE DAILY WITH A MEAL     Endocrinology:  Diabetes - Biguanides Failed - 05/01/2022  9:56 AM      Failed - HBA1C is between 0 and 7.9 and within 180 days    Hgb A1c MFr Bld  Date Value Ref Range Status   07/01/2021 8.5 (H) <5.7 % of total Hgb Final    Comment:    For someone without known diabetes, a hemoglobin A1c value of 6.5% or greater indicates that they may have  diabetes and this should be confirmed with a follow-up  test. . For someone with known diabetes, a value <7% indicates  that their diabetes is well controlled and a value  greater than or equal to 7% indicates suboptimal  control. A1c targets should be individualized based on  duration of diabetes, age, comorbid conditions, and  other considerations. . Currently, no consensus exists regarding use of hemoglobin A1c for diagnosis of diabetes for children. .          Failed - B12 Level in normal range and within 720 days    No results found for: VITAMINB12       Passed - Cr in normal range and within 360 days    Creat  Date Value Ref Range Status  07/01/2021 1.12 0.70 - 1.35 mg/dL Final   Creatinine, Ser  Date Value Ref Range Status  04/21/2022 1.12 0.61 - 1.24 mg/dL Final         Passed - eGFR in normal range and within 360 days    GFR, Estimated  Date Value Ref Range Status  04/21/2022 >60 >60 mL/min Final    Comment:    (NOTE) Calculated using the CKD-EPI Creatinine Equation (2021) Performed at [facility], [address] [place], [place] [address]    eGFR  Date Value Ref Range Status  07/01/2021 71 > OR = 60 mL/min/1.98m Final    Comment:    The eGFR is based on the CKD-EPI 2021 equation. To calculate  the new eGFR from a previous Creatinine or Cystatin C result, go to https://www.kidney.org/professionals/ kdoqi/gfr%5Fcalculator          Passed - Valid encounter within last 6 months    Recent Outpatient Visits          4 months ago [place] pain of both knees   [place]   9 months ago Intestinal infection due to enteropathogenic E. coli   [place], NP   10 months ago Annual physical exam    [facility] [place], [name], DO   1 year ago [facility] [name], [name], DO   1 year ago Type 2 diabetes mellitus with other specified complication, with long-term current use of insulin ([place])   [place], [name], DO      Future Appointments            Tomorrow Duanne Guess, [facility]   In 2 months  [place], [place]   In 11 months Ralene Bathe, MD            Passed - CBC within normal limits and completed in the last 12 months    WBC  Date Value Ref Range Status  04/21/2022 7.2 4.0 - 10.5 K/uL Final   RBC  Date Value Ref Range Status  04/21/2022 5.24 4.22 - 5.81 MIL/uL Final   Hemoglobin  Date Value Ref Range Status  04/21/2022 14.6 13.0 - 17.0 g/dL Final   HCT  Date Value Ref Range Status  04/21/2022 45.4 39.0 - 52.0 % Final   MCHC  Date Value Ref Range Status  04/21/2022 32.2 30.0 - 36.0 g/dL Final   [facility]  Date Value Ref Range Status  04/21/2022 27.9 26.0 - 34.0 pg Final   MCV  Date Value Ref Range Status  04/21/2022 86.6 80.0 - 100.0 fL Final   No results found for: PLTCOUNTKUC, LABPLAT, POCPLA RDW  Date Value Ref Range Status  04/21/2022 13.9 11.5 - 15.5 % Final         . chlorthalidone (HYGROTON) 25 MG tablet [Pharmacy Med Name: CHLORTHALIDONE 25MG TABLETS] 90 tablet 0    Sig: TAKE 1 TABLET(25 MG) BY MOUTH DAILY     Cardiovascular: Diuretics - Thiazide Passed - 05/01/2022  9:56 AM      Passed - Cr in normal range and within 180 days    Creat  Date Value Ref Range Status  07/01/2021 1.12 0.70 - 1.35 mg/dL Final   Creatinine, Ser  Date Value Ref Range Status  04/21/2022 1.12 0.61 - 1.24 mg/dL Final         Passed - K in normal range and within 180 days    Potassium  Date Value Ref Range Status  04/10/2022 3.7 3.5 - 5.1 mmol/L Final         Passed - Na in normal range and within 180 days    Sodium   Date Value Ref Range Status  04/10/2022 137 135 - 145 mmol/L Final  12/19/2019 140 137 - 147 Final         Passed - Last BP in normal range    BP Readings from Last 1 Encounters:  04/24/22 138/82         Passed - Valid encounter within last 6 months    Recent Outpatient Visits          4 months ago Chronic pain of both knees   [place], [name], DO   9 months ago Intestinal infection due to enteropathogenic E. coli   [facility] [place], Coralie Keens, NP   10 months ago Annual physical exam   [facility] [name], DO   1 year ago [place], DO   1 year  ago Type 2 diabetes mellitus with other specified complication, with long-term current use of insulin ([place])   [place], [name], DO      Future Appointments            Tomorrow Duanne Guess, PA-C [facility]   In 2 months  [place], [place]   In 11 months Ralene Bathe, MD

## 2022-05-06 DIAGNOSIS — D649 Anemia, unspecified: Secondary | ICD-10-CM | POA: Diagnosis not present

## 2022-05-06 DIAGNOSIS — Z471 Aftercare following joint replacement surgery: Secondary | ICD-10-CM | POA: Diagnosis not present

## 2022-05-06 DIAGNOSIS — M150 Primary generalized (osteo)arthritis: Secondary | ICD-10-CM | POA: Diagnosis not present

## 2022-05-06 DIAGNOSIS — G8929 Other chronic pain: Secondary | ICD-10-CM | POA: Diagnosis not present

## 2022-05-06 DIAGNOSIS — Z96652 Presence of left artificial knee joint: Secondary | ICD-10-CM | POA: Diagnosis not present

## 2022-05-06 NOTE — Telephone Encounter (Signed)
Medication was refilled 05/05/22 by PCP for 30 days, will refuse duplicate request.  Requested Prescriptions  Pending Prescriptions Disp Refills  . metFORMIN (GLUCOPHAGE-XR) 500 MG 24 hr tablet [Pharmacy Med Name: METFORMIN ER 500MG 24HR TABS] 360 tablet 0    Sig: TAKE 2 TABLETS(1000 MG) BY MOUTH TWICE DAILY WITH A MEAL     Endocrinology:  Diabetes - Biguanides Failed - 05/05/2022 11:10 AM      Failed - HBA1C is between 0 and 7.9 and within 180 days    Hgb A1c MFr Bld  Date Value Ref Range Status  07/01/2021 8.5 (H) <5.7 % of total Hgb Final    Comment:    For someone without known diabetes, a hemoglobin A1c value of 6.5% or greater indicates that they may have  diabetes and this should be confirmed with a follow-up  test. . For someone with known diabetes, a value <7% indicates  that their diabetes is well controlled and a value  greater than or equal to 7% indicates suboptimal  control. A1c targets should be individualized based on  duration of diabetes, age, comorbid conditions, and  other considerations. . Currently, no consensus exists regarding use of hemoglobin A1c for diagnosis of diabetes for children. .          Failed - B12 Level in normal range and within 720 days    No results found for: VITAMINB12       Passed - Cr in normal range and within 360 days    Creat  Date Value Ref Range Status  07/01/2021 1.12 0.70 - 1.35 mg/dL Final   Creatinine, Ser  Date Value Ref Range Status  04/21/2022 1.12 0.61 - 1.24 mg/dL Final         Passed - eGFR in normal range and within 360 days    GFR, Estimated  Date Value Ref Range Status  04/21/2022 >60 >60 mL/min Final    Comment:    (NOTE) Calculated using the CKD-EPI Creatinine Equation (2021) Performed at [facility], [address] [place], [place] [address]    eGFR  Date Value Ref Range Status  07/01/2021 71 > OR = 60 mL/min/1.48m Final    Comment:    The eGFR is based on the CKD-EPI 2021  equation. To calculate  the new eGFR from a previous Creatinine or Cystatin C result, go to https://www.kidney.org/professionals/ kdoqi/gfr%5Fcalculator          Passed - Valid encounter within last 6 months    Recent Outpatient Visits          4 months ago Chronic pain of both knees   [place]   9 months ago Intestinal infection due to enteropathogenic E. coli   [facility]   10 months ago Annual physical exam   [facility]   1 year ago [place]   1 year ago Type 2 diabetes mellitus with other specified complication, with long-term current use of insulin ([facility]   [facility]      Future Appointments            In 2 months  [facility] [place]  In 10 months [place]alene Bathe MD [place] Citywithin normal limits  and completed in the last 12 months    WBC  Date Value Ref Range Status  04/21/2022 7.2 4.0 - 10.5 K/uL Final   RBC  Date Value Ref Range Status  04/21/2022 5.24 4.22 - 5.81 MIL/uL Final   Hemoglobin  Date Value Ref Range Status  04/21/2022 14.6 13.0 - 17.0 g/dL Final   HCT  Date Value Ref Range Status  04/21/2022 45.4 39.0 - 52.0 % Final   MCHC  Date Value Ref Range Status  04/21/2022 32.2 30.0 - 36.0 g/dL Final   [facility]  Date Value Ref Range Status  04/21/2022 27.9 26.0 - 34.0 pg Final   MCV  Date Value Ref Range Status  04/21/2022 86.6 80.0 - 100.0 fL Final   No results found for: PLTCOUNTKUC, LABPLAT, POCPLA RDW  Date Value Ref Range Status  04/21/2022 13.9 11.5 - 15.5 % Final

## 2022-05-08 DIAGNOSIS — Z471 Aftercare following joint replacement surgery: Secondary | ICD-10-CM | POA: Diagnosis not present

## 2022-05-08 DIAGNOSIS — Z96652 Presence of left artificial knee joint: Secondary | ICD-10-CM | POA: Diagnosis not present

## 2022-05-08 DIAGNOSIS — D649 Anemia, unspecified: Secondary | ICD-10-CM | POA: Diagnosis not present

## 2022-05-08 DIAGNOSIS — M150 Primary generalized (osteo)arthritis: Secondary | ICD-10-CM | POA: Diagnosis not present

## 2022-05-08 DIAGNOSIS — G8929 Other chronic pain: Secondary | ICD-10-CM | POA: Diagnosis not present

## 2022-05-11 DIAGNOSIS — Z471 Aftercare following joint replacement surgery: Secondary | ICD-10-CM | POA: Diagnosis not present

## 2022-05-11 DIAGNOSIS — Z96652 Presence of left artificial knee joint: Secondary | ICD-10-CM | POA: Diagnosis not present

## 2022-05-11 DIAGNOSIS — G8929 Other chronic pain: Secondary | ICD-10-CM | POA: Diagnosis not present

## 2022-05-11 DIAGNOSIS — D649 Anemia, unspecified: Secondary | ICD-10-CM | POA: Diagnosis not present

## 2022-05-11 DIAGNOSIS — M150 Primary generalized (osteo)arthritis: Secondary | ICD-10-CM | POA: Diagnosis not present

## 2022-05-13 DIAGNOSIS — Z96652 Presence of left artificial knee joint: Secondary | ICD-10-CM | POA: Diagnosis not present

## 2022-05-13 DIAGNOSIS — M25562 Pain in left knee: Secondary | ICD-10-CM | POA: Diagnosis not present

## 2022-05-13 DIAGNOSIS — M6281 Muscle weakness (generalized): Secondary | ICD-10-CM | POA: Diagnosis not present

## 2022-05-13 DIAGNOSIS — M25662 Stiffness of left knee, not elsewhere classified: Secondary | ICD-10-CM | POA: Diagnosis not present

## 2022-05-14 ENCOUNTER — Telehealth: Payer: Self-pay

## 2022-05-14 NOTE — Telephone Encounter (Signed)
I called and spoke with the patient daughter Asencion Partridge and notified her that her fathers medications are available for pick up.

## 2022-05-15 DIAGNOSIS — Z96652 Presence of left artificial knee joint: Secondary | ICD-10-CM | POA: Diagnosis not present

## 2022-05-15 DIAGNOSIS — M25562 Pain in left knee: Secondary | ICD-10-CM | POA: Diagnosis not present

## 2022-05-19 DIAGNOSIS — R2689 Other abnormalities of gait and mobility: Secondary | ICD-10-CM | POA: Diagnosis not present

## 2022-05-19 DIAGNOSIS — M542 Cervicalgia: Secondary | ICD-10-CM | POA: Diagnosis not present

## 2022-05-19 DIAGNOSIS — R200 Anesthesia of skin: Secondary | ICD-10-CM | POA: Diagnosis not present

## 2022-05-19 DIAGNOSIS — G8929 Other chronic pain: Secondary | ICD-10-CM | POA: Diagnosis not present

## 2022-05-19 DIAGNOSIS — M79602 Pain in left arm: Secondary | ICD-10-CM | POA: Diagnosis not present

## 2022-05-19 DIAGNOSIS — M545 Low back pain, unspecified: Secondary | ICD-10-CM | POA: Diagnosis not present

## 2022-05-19 DIAGNOSIS — G479 Sleep disorder, unspecified: Secondary | ICD-10-CM | POA: Diagnosis not present

## 2022-05-26 DIAGNOSIS — M25562 Pain in left knee: Secondary | ICD-10-CM | POA: Diagnosis not present

## 2022-05-26 DIAGNOSIS — Z96652 Presence of left artificial knee joint: Secondary | ICD-10-CM | POA: Diagnosis not present

## 2022-05-28 DIAGNOSIS — Z96652 Presence of left artificial knee joint: Secondary | ICD-10-CM | POA: Diagnosis not present

## 2022-05-28 DIAGNOSIS — M25562 Pain in left knee: Secondary | ICD-10-CM | POA: Diagnosis not present

## 2022-06-02 DIAGNOSIS — M25562 Pain in left knee: Secondary | ICD-10-CM | POA: Diagnosis not present

## 2022-06-02 DIAGNOSIS — Z96652 Presence of left artificial knee joint: Secondary | ICD-10-CM | POA: Diagnosis not present

## 2022-06-03 DIAGNOSIS — Z96652 Presence of left artificial knee joint: Secondary | ICD-10-CM | POA: Diagnosis not present

## 2022-06-04 DIAGNOSIS — Z96652 Presence of left artificial knee joint: Secondary | ICD-10-CM | POA: Diagnosis not present

## 2022-06-04 DIAGNOSIS — M25562 Pain in left knee: Secondary | ICD-10-CM | POA: Diagnosis not present

## 2022-06-11 ENCOUNTER — Other Ambulatory Visit: Payer: Self-pay | Admitting: Family Medicine

## 2022-06-11 DIAGNOSIS — E1169 Type 2 diabetes mellitus with other specified complication: Secondary | ICD-10-CM

## 2022-06-11 NOTE — Telephone Encounter (Signed)
Refilled 05/05/2022 #[id number] refills. Requested Prescriptions  Pending Prescriptions Disp Refills  . metFORMIN (GLUCOPHAGE-XR) 500 MG 24 hr tablet [Pharmacy Med Name: METFORMIN ER 500MG 24HR TABS] 360 tablet     Sig: TAKE 2 TABLETS(1000 MG) BY MOUTH TWICE DAILY WITH A MEAL     Endocrinology:  Diabetes - Biguanides Failed - 06/11/2022 10:32 AM      Failed - HBA1C is between 0 and 7.9 and within 180 days    Hgb A1c MFr Bld  Date Value Ref Range Status  07/01/2021 8.5 (H) <5.7 % of total Hgb Final    Comment:    For someone without known diabetes, a hemoglobin A1c value of 6.5% or greater indicates that they may have  diabetes and this should be confirmed with a follow-up  test. . For someone with known diabetes, a value <7% indicates  that their diabetes is well controlled and a value  greater than or equal to 7% indicates suboptimal  control. A1c targets should be individualized based on  duration of diabetes, age, comorbid conditions, and  other considerations. . Currently, no consensus exists regarding use of hemoglobin A1c for diagnosis of diabetes for children. .          Failed - B12 Level in normal range and within 720 days    No results found for: "VITAMINB12"       Passed - Cr in normal range and within 360 days    Creat  Date Value Ref Range Status  07/01/2021 1.12 0.70 - 1.35 mg/dL Final   Creatinine, Ser  Date Value Ref Range Status  04/21/2022 1.12 0.61 - 1.24 mg/dL Final         Passed - eGFR in normal range and within 360 days    GFR, Estimated  Date Value Ref Range Status  04/21/2022 >60 >60 mL/min Final    Comment:    (NOTE) Calculated using the CKD-EPI Creatinine Equation (2021) Performed at [facility], [address] [place], [place] [address]    eGFR  Date Value Ref Range Status  07/01/2021 71 > OR = 60 mL/min/1.42m Final    Comment:    The eGFR is based on the CKD-EPI 2021 equation. To calculate  the new eGFR from a previous  Creatinine or Cystatin C result, go to https://www.kidney.org/professionals/ kdoqi/gfr%5Fcalculator          Passed - Valid encounter within last 6 months    Recent Outpatient Visits          5 months ago Chronic pain of both knees   [place]   10 months ago Intestinal infection due to enteropathogenic E. coli   [facility]   11 months ago Annual physical exam   [facility]   1 year ago [facility] [place] DO   1 year ago Type 2 diabetes mellitus with other specified complication, with long-term current use of insulin ([facility]   [place] DO      Future Appointments            In 1 month  [facility] [place]  In 9 months KRalene Bathe MD APatterson Springswithin normal limits and completed in the last 12 months  WBC  Date Value Ref Range Status  04/21/2022 7.2 4.0 - 10.5 K/uL Final   RBC  Date Value Ref Range Status  04/21/2022 5.24 4.22 - 5.81 MIL/uL Final   Hemoglobin  Date Value Ref Range Status  04/21/2022 14.6 13.0 - 17.0 g/dL Final   HCT  Date Value Ref Range Status  04/21/2022 45.4 39.0 - 52.0 % Final   MCHC  Date Value Ref Range Status  04/21/2022 32.2 30.0 - 36.0 g/dL Final   [facility]  Date Value Ref Range Status  04/21/2022 27.9 26.0 - 34.0 pg Final   MCV  Date Value Ref Range Status  04/21/2022 86.6 80.0 - 100.0 fL Final   No results found for: "PLTCOUNTKUC", "LABPLAT", "POCPLA" RDW  Date Value Ref Range Status  04/21/2022 13.9 11.5 - 15.5 % Final

## 2022-06-12 ENCOUNTER — Other Ambulatory Visit: Payer: Self-pay

## 2022-06-12 DIAGNOSIS — E1169 Type 2 diabetes mellitus with other specified complication: Secondary | ICD-10-CM

## 2022-06-15 ENCOUNTER — Other Ambulatory Visit: Payer: Medicare Other

## 2022-06-15 DIAGNOSIS — E1169 Type 2 diabetes mellitus with other specified complication: Secondary | ICD-10-CM

## 2022-06-15 DIAGNOSIS — Z96652 Presence of left artificial knee joint: Secondary | ICD-10-CM | POA: Diagnosis not present

## 2022-06-15 DIAGNOSIS — Z794 Long term (current) use of insulin: Secondary | ICD-10-CM | POA: Diagnosis not present

## 2022-06-16 LAB — HEMOGLOBIN A1C
Hgb A1c MFr Bld: 6.5 % of total Hgb — ABNORMAL HIGH (ref <5.7)
Mean Plasma Glucose: 140 mg/dL
eAG (mmol/L): 7.7 mmol/L

## 2022-06-17 DIAGNOSIS — M25562 Pain in left knee: Secondary | ICD-10-CM | POA: Diagnosis not present

## 2022-06-17 DIAGNOSIS — Z96652 Presence of left artificial knee joint: Secondary | ICD-10-CM | POA: Diagnosis not present

## 2022-06-28 ENCOUNTER — Other Ambulatory Visit: Payer: Self-pay | Admitting: Family Medicine

## 2022-06-28 DIAGNOSIS — E1169 Type 2 diabetes mellitus with other specified complication: Secondary | ICD-10-CM

## 2022-06-29 DIAGNOSIS — R197 Diarrhea, unspecified: Secondary | ICD-10-CM | POA: Diagnosis not present

## 2022-06-29 DIAGNOSIS — R1032 Left lower quadrant pain: Secondary | ICD-10-CM | POA: Diagnosis not present

## 2022-06-30 ENCOUNTER — Other Ambulatory Visit: Payer: Self-pay | Admitting: Family Medicine

## 2022-06-30 DIAGNOSIS — E1169 Type 2 diabetes mellitus with other specified complication: Secondary | ICD-10-CM

## 2022-06-30 NOTE — Telephone Encounter (Signed)
Requested Prescriptions  Pending Prescriptions Disp Refills  . metFORMIN (GLUCOPHAGE-XR) 500 MG 24 hr tablet [Pharmacy Med Name: METFORMIN ER 500MG 24HR TABS] 360 tablet     Sig: TAKE 2 TABLETS(1000 MG) BY MOUTH TWICE DAILY WITH A MEAL     Endocrinology:  Diabetes - Biguanides Failed - 06/28/2022 10:45 AM      Failed - B12 Level in normal range and within 720 days    No results found for: "VITAMINB12"       Failed - Valid encounter within last 6 months    Recent Outpatient Visits          6 months ago Chronic pain of both knees   [facility] [place], [name], DO   11 months ago Intestinal infection due to enteropathogenic E. coli   [facility] [place], Coralie Keens, NP   11 months ago Annual physical exam   [facility] [place], [name], DO   1 year ago [place], DO   1 year ago Type 2 diabetes mellitus with other specified complication, with long-term current use of insulin ([place])   [facility] [name], [name], DO      Future Appointments            In 3 weeks  [facility] [place]   In 9 months Ralene Bathe, MD [place] in normal range and within 360 days    Creat  Date Value Ref Range Status  07/01/2021 1.12 0.70 - 1.35 mg/dL Final   Creatinine, Ser  Date Value Ref Range Status  04/21/2022 1.12 0.61 - 1.24 mg/dL Final         Passed - HBA1C is between 0 and 7.9 and within 180 days    Hgb A1c MFr Bld  Date Value Ref Range Status  06/15/2022 6.5 (H) <5.7 % of total Hgb Final    Comment:    For someone without known diabetes, a hemoglobin A1c value of 6.5% or greater indicates that they may have  diabetes and this should be confirmed with a follow-up  test. . For someone with known diabetes, a value <7% indicates  that their diabetes is well controlled and a value  greater  than or equal to 7% indicates suboptimal  control. A1c targets should be individualized based on  duration of diabetes, age, comorbid conditions, and  other considerations. . Currently, no consensus exists regarding use of hemoglobin A1c for diagnosis of diabetes for children. .          Passed - eGFR in normal range and within 360 days    GFR, Estimated  Date Value Ref Range Status  04/21/2022 >60 >60 mL/min Final    Comment:    (NOTE) Calculated using the CKD-EPI Creatinine Equation (2021) Performed at [facility], [address] [place], [place] [address]    eGFR  Date Value Ref Range Status  07/01/2021 71 > OR = 60 mL/min/1.52m Final    Comment:    The eGFR is based on the CKD-EPI 2021 equation. To calculate  the new eGFR from a previous Creatinine or Cystatin C result, go to https://www.kidney.org/professionals/ kdoqi/gfr%5Fcalculator          Passed - CBC within normal limits and completed in the last 12 months    WBC  Date Value  Ref Range Status  04/21/2022 7.2 4.0 - 10.5 K/uL Final   RBC  Date Value Ref Range Status  04/21/2022 5.24 4.22 - 5.81 MIL/uL Final   Hemoglobin  Date Value Ref Range Status  04/21/2022 14.6 13.0 - 17.0 g/dL Final   HCT  Date Value Ref Range Status  04/21/2022 45.4 39.0 - 52.0 % Final   MCHC  Date Value Ref Range Status  04/21/2022 32.2 30.0 - 36.0 g/dL Final   [facility]  Date Value Ref Range Status  04/21/2022 27.9 26.0 - 34.0 pg Final   MCV  Date Value Ref Range Status  04/21/2022 86.6 80.0 - 100.0 fL Final   No results found for: "PLTCOUNTKUC", "LABPLAT", "POCPLA" RDW  Date Value Ref Range Status  04/21/2022 13.9 11.5 - 15.5 % Final

## 2022-06-30 NOTE — Telephone Encounter (Signed)
Pt called, unable to leave VM d/t phone just keeps ringing. Pt is due for an appt for DM fu and med refill.

## 2022-07-01 ENCOUNTER — Other Ambulatory Visit: Payer: Self-pay | Admitting: Family Medicine

## 2022-07-01 DIAGNOSIS — Z96652 Presence of left artificial knee joint: Secondary | ICD-10-CM | POA: Diagnosis not present

## 2022-07-01 DIAGNOSIS — M1712 Unilateral primary osteoarthritis, left knee: Secondary | ICD-10-CM | POA: Diagnosis not present

## 2022-07-01 DIAGNOSIS — M159 Polyosteoarthritis, unspecified: Secondary | ICD-10-CM

## 2022-07-01 DIAGNOSIS — E1169 Type 2 diabetes mellitus with other specified complication: Secondary | ICD-10-CM

## 2022-07-01 DIAGNOSIS — N401 Enlarged prostate with lower urinary tract symptoms: Secondary | ICD-10-CM

## 2022-07-01 DIAGNOSIS — Z794 Long term (current) use of insulin: Secondary | ICD-10-CM

## 2022-07-01 DIAGNOSIS — M5136 Other intervertebral disc degeneration, lumbar region: Secondary | ICD-10-CM

## 2022-07-01 NOTE — Telephone Encounter (Signed)
refilled 06/30/22  Requested Prescriptions  Refused Prescriptions Disp Refills  . metFORMIN (GLUCOPHAGE-XR) 500 MG 24 hr tablet [Pharmacy Med Name: METFORMIN ER 500MG 24HR TABS] 360 tablet     Sig: TAKE 2 TABLETS(1000 MG) BY MOUTH TWICE DAILY WITH A MEAL     Endocrinology:  Diabetes - Biguanides Failed - 06/30/2022 10:47 AM      Failed - B12 Level in normal range and within 720 days    No results found for: "VITAMINB12"       Failed - Valid encounter within last 6 months    Recent Outpatient Visits          6 months ago Chronic pain of both knees   [facility] [name], DO   11 months ago Intestinal infection due to enteropathogenic E. coli   [facility] [place], Coralie Keens, NP   11 months ago Annual physical exam   [facility] [place], [name], DO   1 year ago [place], [name], DO   1 year ago Type 2 diabetes mellitus with other specified complication, with long-term current use of insulin ([place])   [facility] [name], [name], DO      Future Appointments            In 2 weeks  [facility], [place]   In 9 months Ralene Bathe, MD [place] in normal range and within 360 days    Creat  Date Value Ref Range Status  07/01/2021 1.12 0.70 - 1.35 mg/dL Final   Creatinine, Ser  Date Value Ref Range Status  04/21/2022 1.12 0.61 - 1.24 mg/dL Final         Passed - HBA1C is between 0 and 7.9 and within 180 days    Hgb A1c MFr Bld  Date Value Ref Range Status  06/15/2022 6.5 (H) <5.7 % of total Hgb Final    Comment:    For someone without known diabetes, a hemoglobin A1c value of 6.5% or greater indicates that they may have  diabetes and this should be confirmed with a follow-up  test. . For someone with known diabetes, a value <7% indicates  that their diabetes is well controlled and  a value  greater than or equal to 7% indicates suboptimal  control. A1c targets should be individualized based on  duration of diabetes, age, comorbid conditions, and  other considerations. . Currently, no consensus exists regarding use of hemoglobin A1c for diagnosis of diabetes for children. .          Passed - eGFR in normal range and within 360 days    GFR, Estimated  Date Value Ref Range Status  04/21/2022 >60 >60 mL/min Final    Comment:    (NOTE) Calculated using the CKD-EPI Creatinine Equation (2021) Performed at [facility], [address] [place], [place] [address]    eGFR  Date Value Ref Range Status  07/01/2021 71 > OR = 60 mL/min/1.22m Final    Comment:    The eGFR is based on the CKD-EPI 2021 equation. To calculate  the new eGFR from a previous Creatinine or Cystatin C result, go to https://www.kidney.org/professionals/ kdoqi/gfr%5Fcalculator          Passed - CBC within normal limits and completed in the last 12 months    WBC  Date Value Ref Range Status  04/21/2022 7.2 4.0 - 10.5 K/uL Final   RBC  Date Value Ref Range Status  04/21/2022 5.24 4.22 - 5.81 MIL/uL Final   Hemoglobin  Date Value Ref Range Status  04/21/2022 14.6 13.0 - 17.0 g/dL Final   HCT  Date Value Ref Range Status  04/21/2022 45.4 39.0 - 52.0 % Final   MCHC  Date Value Ref Range Status  04/21/2022 32.2 30.0 - 36.0 g/dL Final   [facility]  Date Value Ref Range Status  04/21/2022 27.9 26.0 - 34.0 pg Final   MCV  Date Value Ref Range Status  04/21/2022 86.6 80.0 - 100.0 fL Final   No results found for: "PLTCOUNTKUC", "LABPLAT", "POCPLA" RDW  Date Value Ref Range Status  04/21/2022 13.9 11.5 - 15.5 % Final

## 2022-07-02 NOTE — Telephone Encounter (Signed)
D/C 04/24/22. Requested Prescriptions  Signed Prescriptions Disp Refills   atorvastatin (LIPITOR) 20 MG tablet 90 tablet 0    Sig: TAKE 1 TABLET BY MOUTH AT BEDTIME     Cardiovascular:  Antilipid - Statins Failed - 07/01/2022  6:22 AM      Failed - Lipid Panel in normal range within the last 12 months    Cholesterol  Date Value Ref Range Status  07/01/2021 122 <200 mg/dL Final   LDL Cholesterol (Calc)  Date Value Ref Range Status  07/01/2021 62 mg/dL (calc) Final    Comment:    Reference range: <100 . Desirable range <100 mg/dL for primary prevention;   <70 mg/dL for patients with CHD or diabetic patients  with > or = 2 CHD risk factors. Marland Kitchen LDL-C is now calculated using the Martin-Hopkins  calculation, which is a validated novel method providing  better accuracy than the Friedewald equation in the  estimation of LDL-C.  Cresenciano Genre et al. Annamaria Helling. 2440;102(72): 2061-2068  (http://education.Quest[employer].com/faq/FAQ164)    HDL  Date Value Ref Range Status  07/01/2021 33 (L) > OR = 40 mg/dL Final   Triglycerides  Date Value Ref Range Status  07/01/2021 209 (H) <150 mg/dL Final    Comment:    . If a non-fasting specimen was collected, consider repeat triglyceride testing on a fasting specimen if clinically indicated.  Yates Decamp et al. J. of Clin. Lipidol. 5366;4:403-474. Marland Kitchen          Passed - Patient is not pregnant      Passed - Valid encounter within last 12 months    Recent Outpatient Visits          6 months ago Chronic pain of both knees   [facility] [place], [name], DO   11 months ago Intestinal infection due to enteropathogenic E. coli   [facility], Coralie Keens, NP   11 months ago Annual physical exam   [facility] [name], DO   1 year ago [place], DO   1 year ago Type 2 diabetes mellitus with other specified  complication, with long-term current use of insulin ([place])   [facility] [name], DO      Future Appointments            In 2 weeks  [facility], [place]   In 9 months Ralene Bathe, MD [place]            finasteride (PROSCAR) 5 MG tablet 90 tablet 0    Sig: TAKE 1 TABLET(5 MG) BY MOUTH EVERY EVENING     Urology: 5-alpha Reductase Inhibitors Failed - 07/01/2022  6:22 AM      Failed - PSA in normal range and within 360 days    PSA  Date Value Ref Range Status  07/01/2021 0.10 < OR = 4.00 ng/mL Final    Comment:    The total PSA value from this assay system is  standardized against the WHO standard. The test  result will be approximately 20% lower when compared  to the equimolar-standardized total PSA (Beckman  Coulter). Comparison of serial PSA results should be  interpreted with this fact in mind. . This test was performed using the Siemens  chemiluminescent method. Values obtained from  different assay methods cannot be used interchangeably. PSA levels, regardless of value, should not be interpreted as absolute evidence  of the presence or absence of disease.          Passed - Valid encounter within last 12 months    Recent Outpatient Visits          6 months ago Chronic pain of both knees   [facility] [place], [name], DO   11 months ago Intestinal infection due to enteropathogenic E. coli   [facility], Coralie Keens, NP   11 months ago Annual physical exam   [facility] [name], DO   1 year ago [place], DO   1 year ago Type 2 diabetes mellitus with other specified complication, with long-term current use of insulin [facility])   [facility] [name], [name], DO      Future Appointments            In 2 weeks  [facility], [place]   In 9  months Ralene Bathe, MD [place]  . celecoxib (CELEBREX) 200 MG capsule [Pharmacy Med Name: CELECOXIB 200MG CAPSULES] 180 capsule 0    Sig: TAKE 1 CAPSULE(200 MG) BY MOUTH TWICE DAILY     Analgesics:  COX2 Inhibitors Failed - 07/01/2022  6:22 AM      Failed - Manual Review: Labs are only required if the patient has taken medication for more than 8 weeks.      Failed - ALT in normal range and within 360 days    ALT  Date Value Ref Range Status  04/10/2022 51 (H) 0 - 44 U/L Final         Passed - HGB in normal range and within 360 days    Hemoglobin  Date Value Ref Range Status  04/21/2022 14.6 13.0 - 17.0 g/dL Final         Passed - Cr in normal range and within 360 days    Creat  Date Value Ref Range Status  07/01/2021 1.12 0.70 - 1.35 mg/dL Final   Creatinine, Ser  Date Value Ref Range Status  04/21/2022 1.12 0.61 - 1.24 mg/dL Final         Passed - HCT in normal range and within 360 days    HCT  Date Value Ref Range Status  04/21/2022 45.4 39.0 - 52.0 % Final         Passed - AST in normal range and within 360 days    AST  Date Value Ref Range Status  04/10/2022 39 15 - 41 U/L Final         Passed - eGFR is 30 or above and within 360 days    GFR, Estimated  Date Value Ref Range Status  04/21/2022 >60 >60 mL/min Final    Comment:    (NOTE) Calculated using the CKD-EPI Creatinine Equation (2021) Performed at [facility], [address] [place], [place] [address]    eGFR  Date Value Ref Range Status  07/01/2021 71 > OR = 60 mL/min/1.24m Final    Comment:    The eGFR is based on the CKD-EPI 2021 equation. To calculate  the new eGFR from a previous Creatinine or Cystatin C result, go to https://www.kidney.org/professionals/ kdoqi/gfr%5Fcalculator          Passed - Patient is not pregnant      Passed - Valid encounter within  last 12 months    Recent Outpatient Visits           6 months ago Chronic pain of both knees   [place], DO   11 months ago Intestinal infection due to enteropathogenic E. coli   [facility] [place], Coralie Keens, NP   11 months ago Annual physical exam   [facility] [name], DO   1 year ago [facility] [place], [name], DO   1 year ago Type 2 diabetes mellitus with other specified complication, with long-term current use of insulin [facility])   [place], DO      Future Appointments            In 2 weeks  [facility], [place]   In 9 months Ralene Bathe, MD [place]

## 2022-07-04 ENCOUNTER — Other Ambulatory Visit: Payer: Self-pay | Admitting: Family Medicine

## 2022-07-04 DIAGNOSIS — K219 Gastro-esophageal reflux disease without esophagitis: Secondary | ICD-10-CM

## 2022-07-06 NOTE — Telephone Encounter (Signed)
Requested Prescriptions  Pending Prescriptions Disp Refills  . omeprazole (PRILOSEC) 20 MG capsule [Pharmacy Med Name: OMEPRAZOLE '20MG'$  CAPSULES] 90 capsule 0    Sig: TAKE ONE CAPSULE BY MOUTH DAILY BEFORE BREAKFAST     Gastroenterology: Proton Pump Inhibitors Passed - 07/04/2022  9:16 PM      Passed - Valid encounter within last 12 months    Recent Outpatient Visits          6 months ago Chronic pain of both knees   [facility] [place], [name], DO   11 months ago Intestinal infection due to enteropathogenic E. coli   [facility], Coralie Keens, NP   12 months ago Annual physical exam   [facility] [name], DO   1 year ago [place], DO   1 year ago Type 2 diabetes mellitus with other specified complication, with long-term current use of insulin ([place])   [facility] [name], [name], DO      Future Appointments            In 2 weeks  [facility], [place]   In 8 months Ralene Bathe, MD [place]

## 2022-07-20 DIAGNOSIS — H35372 Puckering of macula, left eye: Secondary | ICD-10-CM | POA: Diagnosis not present

## 2022-07-20 DIAGNOSIS — E119 Type 2 diabetes mellitus without complications: Secondary | ICD-10-CM | POA: Diagnosis not present

## 2022-07-20 DIAGNOSIS — H2513 Age-related nuclear cataract, bilateral: Secondary | ICD-10-CM | POA: Diagnosis not present

## 2022-07-20 DIAGNOSIS — H43393 Other vitreous opacities, bilateral: Secondary | ICD-10-CM | POA: Diagnosis not present

## 2022-07-20 LAB — HM DIABETES EYE EXAM

## 2022-07-21 ENCOUNTER — Ambulatory Visit: Payer: Medicare Other

## 2022-07-24 ENCOUNTER — Other Ambulatory Visit: Payer: Self-pay | Admitting: Family Medicine

## 2022-07-24 DIAGNOSIS — E1169 Type 2 diabetes mellitus with other specified complication: Secondary | ICD-10-CM

## 2022-07-24 DIAGNOSIS — I10 Essential (primary) hypertension: Secondary | ICD-10-CM

## 2022-07-24 NOTE — Telephone Encounter (Signed)
Requested medication (s) are due for refill today: yes  Requested medication (s) are on the active medication list: yes  Last refill:  05/05/22 except for metformin 06/30/22  Future visit scheduled: yes AWV 07/27/22  Notes to clinic:  pt is due for appt.      Requested Prescriptions  Pending Prescriptions Disp Refills   pioglitazone (ACTOS) 15 MG tablet [Pharmacy Med Name: PIOGLITAZONE 15MG TABLETS] 90 tablet 0    Sig: TAKE 1 TABLET(15 MG) BY MOUTH DAILY     Endocrinology:  Diabetes - Glitazones - pioglitazone Failed - 07/24/2022  6:20 AM      Failed - Valid encounter within last 6 months    Recent Outpatient Visits           7 months ago Chronic pain of both knees   [facility] [place], [name], DO   11 months ago Intestinal infection due to enteropathogenic E. coli   [facility], Coralie Keens, NP   1 year ago Annual physical exam   [facility], [name], DO   1 year ago [facility] [place], [name], DO   1 year ago Type 2 diabetes mellitus with other specified complication, with long-term current use of insulin ([place])   [facility] [name], [name], DO       Future Appointments             In 8 months Ralene Bathe, MD [place] is between 0 and 7.9 and within 180 days    Hgb A1c MFr Bld  Date Value Ref Range Status  06/15/2022 6.5 (H) <5.7 % of total Hgb Final    Comment:    For someone without known diabetes, a hemoglobin A1c value of 6.5% or greater indicates that they may have  diabetes and this should be confirmed with a follow-up  test. . For someone with known diabetes, a value <7% indicates  that their diabetes is well controlled and a value  greater than or equal to 7% indicates suboptimal  control. A1c targets should be individualized based on  duration of diabetes, age,  comorbid conditions, and  other considerations. . Currently, no consensus exists regarding use of hemoglobin A1c for diagnosis of diabetes for children. .           losartan (COZAAR) 50 MG tablet [Pharmacy Med Name: LOSARTAN 50MG TABLETS] 90 tablet 0    Sig: TAKE 1 TABLET(50 MG) BY MOUTH DAILY     Cardiovascular:  Angiotensin Receptor Blockers Failed - 07/24/2022  6:20 AM      Failed - Valid encounter within last 6 months    Recent Outpatient Visits           7 months ago Chronic pain of both knees   [place], DO   11 months ago Intestinal infection due to enteropathogenic E. coli   [facility] [place], Coralie Keens, NP   1 year ago Annual physical exam   [facility] [name], DO   1 year ago [place], DO   1 year ago Type 2 diabetes mellitus with other specified complication, with long-term current use of insulin [facility])   [place], [name], DO  Future Appointments             In 8 months Ralene Bathe, MD [place] in normal range and within 180 days    Creat  Date Value Ref Range Status  07/01/2021 1.12 0.70 - 1.35 mg/dL Final   Creatinine, Ser  Date Value Ref Range Status  04/21/2022 1.12 0.61 - 1.24 mg/dL Final         Passed - K in normal range and within 180 days    Potassium  Date Value Ref Range Status  04/10/2022 3.7 3.5 - 5.1 mmol/L Final         Passed - Patient is not pregnant      Passed - Last BP in normal range    BP Readings from Last 1 Encounters:  04/24/22 138/82          chlorthalidone (HYGROTON) 25 MG tablet [Pharmacy Med Name: CHLORTHALIDONE 25MG TABLETS] 90 tablet 0    Sig: TAKE 1 TABLET(25 MG) BY MOUTH DAILY     Cardiovascular: Diuretics - Thiazide Failed - 07/24/2022  6:20 AM      Failed - Valid encounter  within last 6 months    Recent Outpatient Visits           7 months ago Chronic pain of both knees   [place], DO   11 months ago Intestinal infection due to enteropathogenic E. coli   [facility] [place], Coralie Keens, NP   1 year ago Annual physical exam   [facility] [name], DO   1 year ago [place], [name], DO   1 year ago Type 2 diabetes mellitus with other specified complication, with long-term current use of insulin ([place])   [facility] [name], [name], DO       Future Appointments             In 8 months Ralene Bathe, MD [place] in normal range and within 180 days    Creat  Date Value Ref Range Status  07/01/2021 1.12 0.70 - 1.35 mg/dL Final   Creatinine, Ser  Date Value Ref Range Status  04/21/2022 1.12 0.61 - 1.24 mg/dL Final         Passed - K in normal range and within 180 days    Potassium  Date Value Ref Range Status  04/10/2022 3.7 3.5 - 5.1 mmol/L Final         Passed - Na in normal range and within 180 days    Sodium  Date Value Ref Range Status  04/10/2022 137 135 - 145 mmol/L Final  12/19/2019 140 137 - 147 Final         Passed - Last BP in normal range    BP Readings from Last 1 Encounters:  04/24/22 138/82          metFORMIN (GLUCOPHAGE-XR) 500 MG 24 hr tablet [Pharmacy Med Name: METFORMIN ER 500MG 24HR TABS] 360 tablet     Sig: TAKE 2 TABLETS(1000 MG) BY MOUTH TWICE DAILY WITH A MEAL     Endocrinology:  Diabetes - Biguanides Failed - 07/24/2022  6:20 AM      Failed - B12 Level in normal range and  within 720 days    No results found for: "VITAMINB12"       Failed - Valid encounter within last 6 months    Recent Outpatient Visits           7 months ago Chronic pain of both knees   [facility] [place],  [name], DO   11 months ago Intestinal infection due to enteropathogenic E. coli   [facility] [place], Coralie Keens, NP   1 year ago Annual physical exam   [facility] [name], DO   1 year ago [place], DO   1 year ago Type 2 diabetes mellitus with other specified complication, with long-term current use of insulin ([place])   [facility] [name], DO       Future Appointments             In 8 months Ralene Bathe, MD [place] in normal range and within 360 days    Creat  Date Value Ref Range Status  07/01/2021 1.12 0.70 - 1.35 mg/dL Final   Creatinine, Ser  Date Value Ref Range Status  04/21/2022 1.12 0.61 - 1.24 mg/dL Final         Passed - HBA1C is between 0 and 7.9 and within 180 days    Hgb A1c MFr Bld  Date Value Ref Range Status  06/15/2022 6.5 (H) <5.7 % of total Hgb Final    Comment:    For someone without known diabetes, a hemoglobin A1c value of 6.5% or greater indicates that they may have  diabetes and this should be confirmed with a follow-up  test. . For someone with known diabetes, a value <7% indicates  that their diabetes is well controlled and a value  greater than or equal to 7% indicates suboptimal  control. A1c targets should be individualized based on  duration of diabetes, age, comorbid conditions, and  other considerations. . Currently, no consensus exists regarding use of hemoglobin A1c for diagnosis of diabetes for children. .          Passed - eGFR in normal range and within 360 days    GFR, Estimated  Date Value Ref Range Status  04/21/2022 >60 >60 mL/min Final    Comment:    (NOTE) Calculated using the CKD-EPI Creatinine Equation (2021) Performed at [facility], [address] [place], [place] [address]    eGFR  Date Value Ref Range Status   07/01/2021 71 > OR = 60 mL/min/1.70m Final    Comment:    The eGFR is based on the CKD-EPI 2021 equation. To calculate  the new eGFR from a previous Creatinine or Cystatin C result, go to https://www.kidney.org/professionals/ kdoqi/gfr%5Fcalculator          Passed - CBC within normal limits and completed in the last 12 months    WBC  Date Value Ref Range Status  04/21/2022 7.2 4.0 - 10.5 K/uL Final   RBC  Date Value Ref Range Status  04/21/2022 5.24 4.22 - 5.81 MIL/uL Final   Hemoglobin  Date Value Ref Range Status  04/21/2022 14.6 13.0 - 17.0 g/dL Final   HCT  Date Value Ref Range Status  04/21/2022 45.4 39.0 - 52.0 % Final   MCHC  Date Value Ref Range Status  04/21/2022 32.2 30.0 -  36.0 g/dL Final   [facility]  Date Value Ref Range Status  04/21/2022 27.9 26.0 - 34.0 pg Final   MCV  Date Value Ref Range Status  04/21/2022 86.6 80.0 - 100.0 fL Final   No results found for: "PLTCOUNTKUC", "LABPLAT", "POCPLA" RDW  Date Value Ref Range Status  04/21/2022 13.9 11.5 - 15.5 % Final

## 2022-07-27 ENCOUNTER — Ambulatory Visit (INDEPENDENT_AMBULATORY_CARE_PROVIDER_SITE_OTHER): Payer: Medicare Other

## 2022-07-27 ENCOUNTER — Other Ambulatory Visit: Payer: Self-pay

## 2022-07-27 VITALS — BP 117/61 | HR 83 | Temp 98.4°F | Resp 18 | Ht 69.0 in | Wt 305.6 lb

## 2022-07-27 DIAGNOSIS — Z0000 Encounter for general adult medical examination without abnormal findings: Secondary | ICD-10-CM

## 2022-07-27 DIAGNOSIS — K219 Gastro-esophageal reflux disease without esophagitis: Secondary | ICD-10-CM

## 2022-07-27 DIAGNOSIS — J3089 Other allergic rhinitis: Secondary | ICD-10-CM

## 2022-07-27 DIAGNOSIS — Z1211 Encounter for screening for malignant neoplasm of colon: Secondary | ICD-10-CM

## 2022-07-27 DIAGNOSIS — N401 Enlarged prostate with lower urinary tract symptoms: Secondary | ICD-10-CM

## 2022-07-27 DIAGNOSIS — E1169 Type 2 diabetes mellitus with other specified complication: Secondary | ICD-10-CM

## 2022-07-27 DIAGNOSIS — F331 Major depressive disorder, recurrent, moderate: Secondary | ICD-10-CM

## 2022-07-27 MED ORDER — FINASTERIDE 5 MG PO TABS: ORAL_TABLET | ORAL | 0 refills | Status: DC

## 2022-07-27 MED ORDER — FENOFIBRATE 134 MG PO CAPS: 134.0000 mg | ORAL_CAPSULE | Freq: Every day | ORAL | 0 refills | Status: DC

## 2022-07-27 MED ORDER — CETIRIZINE HCL 10 MG PO TABS: ORAL_TABLET | ORAL | 0 refills | Status: DC

## 2022-07-27 MED ORDER — TAMSULOSIN HCL 0.4 MG PO CAPS: ORAL_CAPSULE | ORAL | 0 refills | Status: DC

## 2022-07-27 MED ORDER — ATORVASTATIN CALCIUM 20 MG PO TABS: 20.0000 mg | ORAL_TABLET | Freq: Every day | ORAL | 0 refills | Status: DC

## 2022-07-27 MED ORDER — OMEPRAZOLE 20 MG PO CPDR: DELAYED_RELEASE_CAPSULE | ORAL | 0 refills | Status: DC

## 2022-07-27 NOTE — Patient Instructions (Signed)
Colonoscopy, Adult A colonoscopy is a procedure to look at the entire large intestine. This procedure is done using a long, thin, flexible tube that has a camera on the end. You may have a colonoscopy: As a part of normal colorectal screening. If you have certain symptoms, such as: A low number of red blood cells in your blood (anemia). Diarrhea that does not go away. Pain in your abdomen. Blood in your stool. A colonoscopy can help screen for and diagnose medical problems, including: An abnormal growth of cells or tissue (tumor). Abnormal growths within the lining of your intestine (polyps). Inflammation. Areas of bleeding. Tell your health care provider about: Any allergies you have. All medicines you are taking, including vitamins, herbs, eye drops, creams, and over-the-counter medicines. Any problems you or family members have had with anesthetic medicines. Any bleeding problems you have. Any surgeries you have had. Any medical conditions you have. Any problems you have had with having bowel movements. Whether you are pregnant or may be pregnant. What are the risks? Generally, this is a safe procedure. However, problems may occur, including: Bleeding. Damage to your intestine. Allergic reactions to medicines given during the procedure. Infection. This is rare. What happens before the procedure? Eating and drinking restrictions Follow instructions from your health care provider about eating or drinking restrictions, which may include: A few days before the procedure: Follow a low-fiber diet. Avoid nuts, seeds, dried fruit, raw fruits, and vegetables. 1-3 days before the procedure: Eat only gelatin dessert or ice pops. Drink only clear liquids, such as water, clear juice, clear broth or bouillon, black coffee or tea, or clear soft drinks or sports drinks. Avoid liquids that contain red or purple dye. The day of the procedure: Do not eat solid foods. You may continue to drink  clear liquids until up to 2 hours before the procedure. Do not eat or drink anything starting 2 hours before the procedure, or within the time period that your health care provider recommends. Bowel prep If you were prescribed a bowel prep to take by mouth (orally) to clean out your colon: Take it as told by your health care provider. Starting the day before your procedure, you will need to drink a large amount of liquid medicine. The liquid will cause you to have many bowel movements of loose stool until your stool becomes almost clear or light green. If your skin or the opening between the buttocks (anus) gets irritated from diarrhea, you may relieve the irritation using: Wipes with medicine in them, such as adult wet wipes with aloe and vitamin E. A product to soothe skin, such as petroleum jelly. If you vomit while drinking the bowel prep: Take a break for up to 60 minutes. Begin the bowel prep again. Call your health care provider if you keep vomiting or you cannot take the bowel prep without vomiting. To clean out your colon, you may also be given: Laxative medicines. These help you have a bowel movement. Instructions for enema use. An enema is liquid medicine injected into your rectum. Medicines Ask your health care provider about: Changing or stopping your regular medicines or supplements. This is especially important if you are taking iron supplements, diabetes medicines, or blood thinners. Taking medicines such as aspirin and ibuprofen. These medicines can thin your blood. Do not take these medicines unless your health care provider tells you to take them. Taking over-the-counter medicines, vitamins, herbs, and supplements. General instructions Ask your health care provider what steps will be  taken to help prevent infection. These may include washing skin with a germ-killing soap. If you will be going home right after the procedure, plan to have a responsible adult: Take you home  from the hospital or clinic. You will not be allowed to drive. Care for you for the time you are told. What happens during the procedure?  An IV will be inserted into one of your veins. You will be given a medicine to make you fall asleep (general anesthetic). You will lie on your side with your knees bent. A lubricant will be put on the tube. Then the tube will be: Inserted into your anus. Gently eased through all parts of your large intestine. Air will be sent into your colon to keep it open. This may cause some pressure or cramping. Images will be taken with the camera and will appear on a screen. A small tissue sample may be removed to be looked at under a microscope (biopsy). The tissue may be sent to a lab for testing if any signs of problems are found. If small polyps are found, they may be removed and checked for cancer cells. When the procedure is finished, the tube will be removed. The procedure may vary among health care providers and hospitals. What happens after the procedure? Your blood pressure, heart rate, breathing rate, and blood oxygen level will be monitored until you leave the hospital or clinic. You may have a small amount of blood in your stool. You may pass gas and have mild cramping or bloating in your abdomen. This is caused by the air that was used to open your colon during the exam. If you were given a sedative during the procedure, it can affect you for several hours. Do not drive or operate machinery until your health care provider says that it is safe. It is up to you to get the results of your procedure. Ask your health care provider, or the department that is doing the procedure, when your results will be ready. Summary A colonoscopy is a procedure to look at the entire large intestine. Follow instructions from your health care provider about eating and drinking before the procedure. If you were prescribed an oral bowel prep to clean out your colon, take it  as told by your health care provider. During the colonoscopy, a flexible tube with a camera on its end is inserted into the anus and then passed into all parts of the large intestine. This information is not intended to replace advice given to you by your health care provider. Make sure you discuss any questions you have with your health care provider. Document Revised: 11/17/2021 Document Reviewed: 07/16/2021 Elsevier Patient Education  [place] for Type 2 Diabetes  A screening test for type 2 diabetes (type 2 diabetes mellitus) is a blood test to measure your blood sugar (glucose) level. This test is done to check for early signs of diabetes, before you develop symptoms.  Type 2 diabetes is a long-term (chronic) disease. In type 2 diabetes, one or both of these problems may be present: The pancreas does not make enough of a hormone called insulin. Cells in the body do not respond properly to insulin that the body makes (insulin resistance). Normally, insulin allows blood sugar (glucose) to enter cells in the body. The cells use glucose for energy. Insulin resistance or lack of insulin causes excess glucose to build up in the blood instead of going into cells. This results in high  blood glucose levels (hyperglycemia), which can cause many complications. You may be screened for type 2 diabetes as part of your regular health care, especially if you have a high risk for diabetes. Screening can help to identify type 2 diabetes at its early stage (prediabetes). Identifying and treating prediabetes may delay or prevent the development of type 2 diabetes. Tell a health care provider about: All medicines you are taking, including vitamins, herbs, eye drops, creams, and over-the-counter medicines. Any bleeding problems you have. Any medical conditions you have. Whether you are pregnant or may be pregnant. Who should be screened for type 2 diabetes? Adults Adults age 109 and older.  These adults should be screened once every three years. Adults who are any age, are overweight, and have one other risk factor. These adults should be screened once every three years. Adults who have normal blood glucose levels and two or more risk factors. These adults may be screened once every year (annually). Women who have had gestational diabetes in the past. These women should be screened once every three years. Pregnant women who have risk factors. These women should be screened at their first prenatal visit and again between weeks 24 and 28 of pregnancy. Children and adolescents Children and adolescents should be screened for type 2 diabetes if they are overweight and have any of the following risk factors: A family history of type 2 diabetes. Being a member of a high-risk ethnic group. Signs of insulin resistance or conditions that are associated with insulin resistance. A mother who had gestational diabetes while pregnant. Screening should be done at least once every three years, starting at age 54 or at the onset of puberty, whichever comes first. Your health care provider or your child's health care provider may recommend having a screening more or less often. What are the risk factors for type 2 diabetes? The following are factors that may make you more likely to develop type 2 diabetes and can be modified: Not getting enough exercise. Having high blood pressure. Having low levels of good cholesterol (HDL-C) or high levels of blood fats (triglycerides). Having high blood glucose in a previous blood test. Being overweight or obese. The following are factors that may make you more likely to develop type 2 diabetes and can not be modified: Having a parent or sibling (first-degree relative) who has diabetes. Being of American-Indian, African-American, Hispanic/Latino, Asian, or [place] descent. Being older than age 32. Having a history of diabetes during pregnancy  (gestational diabetes). Having certain diseases or conditions that may be caused by insulin resistance, including: Acanthosis nigricans. This is a condition that causes dark skin on the neck, armpits, and groin. Polycystic ovary syndrome (PCOS). Cardiovascular heart disease. What happens during screening? During screening, your health care provider may ask questions about: Your health and your risk factors, including your activity level and any medical conditions that you have. The health of your first-degree relatives. Past pregnancies, if this applies. Your health care provider will also do a physical exam, including a blood pressure measurement and blood tests. There are four blood tests that can be used to screen for type 2 diabetes. You may have one or more of the following: A fasting blood glucose (FBG) test. You will not be allowed to eat (you will fast) for 8 hours or more before a blood sample is taken. A random blood glucose test. This test checks your blood glucose at any time of the day regardless of when you ate. An oral  glucose tolerance test (OGTT). This test measures your blood glucose at two times: After you have not eaten (have fasted) overnight. This is your baseline glucose level. Two hours after you drink a glucose-containing beverage. An A1C (hemoglobin A1C) blood test. This test provides information about blood glucose control over the previous 2-3 months. What do the results mean? Your test results are a measurement of how much glucose is in your blood. Normal blood glucose levels mean that you do not have diabetes or prediabetes. High blood glucose levels may mean that you have prediabetes or diabetes. Depending on the results, other tests may be needed to confirm the diagnosis. You may be diagnosed with type 2 diabetes if: Your FBG level is 126 mg/dL (7.0 mmol/L) or higher. Your random blood glucose level is 200 mg/dL (11.1 mmol/L) or higher. Your A1C level is 6.5% or  higher. Your OGTT result is higher than 200 mg/dL (11.1 mmol/L). These blood tests may be repeated to confirm your diagnosis. Talk with your health care provider about what your results mean. Summary A screening test for type 2 diabetes (type 2 diabetes mellitus) is a blood test to measure your blood sugar (glucose) level. Know what your risk factors are for developing type 2 diabetes. If you are at risk, get screening tests as often as told by your health care provider. Screening may help you identify type 2 diabetes at its early stage (prediabetes). Identifying and treating prediabetes may delay or prevent the development of type 2 diabetes. This information is not intended to replace advice given to you by your health care provider. Make sure you discuss any questions you have with your health care provider. Document Revised: 02/17/2021 Document Reviewed: 02/17/2021 Elsevier Patient Education  [place] Maintenance, Male Adopting a healthy lifestyle and getting preventive care are important in promoting health and wellness. Ask your health care provider about: The right schedule for you to have regular tests and exams. Things you can do on your own to prevent diseases and keep yourself healthy. What should I know about diet, weight, and exercise? Eat a healthy diet  Eat a diet that includes plenty of vegetables, fruits, low-fat dairy products, and lean protein. Do not eat a lot of foods that are high in solid fats, added sugars, or sodium. Maintain a healthy weight Body mass index (BMI) is a measurement that can be used to identify possible weight problems. It estimates body fat based on height and weight. Your health care provider can help determine your BMI and help you achieve or maintain a healthy weight. Get regular exercise Get regular exercise. This is one of the most important things you can do for your health. Most adults should: Exercise for at least 150  minutes each week. The exercise should increase your heart rate and make you sweat (moderate-intensity exercise). Do strengthening exercises at least twice a week. This is in addition to the moderate-intensity exercise. Spend less time sitting. Even light physical activity can be beneficial. Watch cholesterol and blood lipids Have your blood tested for lipids and cholesterol at 70 years of age, then have this test every 5 years. You may need to have your cholesterol levels checked more often if: Your lipid or cholesterol levels are high. You are older than 70 years of age. You are at high risk for heart disease. What should I know about cancer screening? Many types of cancers can be detected early and may often be prevented. Depending on your health  history and family history, you may need to have cancer screening at various ages. This may include screening for: Colorectal cancer. Prostate cancer. Skin cancer. Lung cancer. What should I know about heart disease, diabetes, and high blood pressure? Blood pressure and heart disease High blood pressure causes heart disease and increases the risk of stroke. This is more likely to develop in people who have high blood pressure readings or are overweight. Talk with your health care provider about your target blood pressure readings. Have your blood pressure checked: Every 3-5 years if you are 82-34 years of age. Every year if you are 87 years old or older. If you are between the ages of 62 and 36 and are a current or former smoker, ask your health care provider if you should have a one-time screening for abdominal aortic aneurysm (AAA). Diabetes Have regular diabetes screenings. This checks your fasting blood sugar level. Have the screening done: Once every three years after age 105 if you are at a normal weight and have a low risk for diabetes. More often and at a younger age if you are overweight or have a high risk for diabetes. What should I  know about preventing infection? Hepatitis B If you have a higher risk for hepatitis B, you should be screened for this virus. Talk with your health care provider to find out if you are at risk for hepatitis B infection. Hepatitis C Blood testing is recommended for: Everyone born from [birth year] through 1964. Anyone with known risk factors for hepatitis C. Sexually transmitted infections (STIs) You should be screened each year for STIs, including gonorrhea and chlamydia, if: You are sexually active and are younger than 70 years of age. You are older than 70 years of age and your health care provider tells you that you are at risk for this type of infection. Your sexual activity has changed since you were last screened, and you are at increased risk for chlamydia or gonorrhea. Ask your health care provider if you are at risk. Ask your health care provider about whether you are at high risk for HIV. Your health care provider may recommend a prescription medicine to help prevent HIV infection. If you choose to take medicine to prevent HIV, you should first get tested for HIV. You should then be tested every 3 months for as long as you are taking the medicine. Follow these instructions at home: Alcohol use Do not drink alcohol if your health care provider tells you not to drink. If you drink alcohol: Limit how much you have to 0-2 drinks a day. Know how much alcohol is in your drink. In the [place] one drink equals one 12 oz bottle of beer (355 mL), one 5 oz glass of wine (148 mL), or one 1 oz glass of hard liquor (44 mL). Lifestyle Do not use any products that contain nicotine or tobacco. These products include cigarettes, chewing tobacco, and vaping devices, such as e-cigarettes. If you need help quitting, ask your health care provider. Do not use street drugs. Do not share needles. Ask your health care provider for help if you need support or information about quitting drugs. General  instructions Schedule regular health, dental, and eye exams. Stay current with your vaccines. Tell your health care provider if: You often feel depressed. You have ever been abused or do not feel safe at home. Summary Adopting a healthy lifestyle and getting preventive care are important in promoting health and wellness. Follow your health care provider's  instructions about healthy diet, exercising, and getting tested or screened for diseases. Follow your health care provider's instructions on monitoring your cholesterol and blood pressure. This information is not intended to replace advice given to you by your health care provider. Make sure you discuss any questions you have with your health care provider. Document Revised: 04/14/2021 Document Reviewed: 04/14/2021 Elsevier Patient Education  [place].

## 2022-07-27 NOTE — Progress Notes (Signed)
Subjective:   Eric Andrews is a 71 y.o. male who presents for Medicare Annual/Subsequent preventive examination.  Review of Systems    Per HPI unless specifically indicated below Cardiac Risk Factors include: advanced age (>87mn, >>58women);male gender, hyperlipidemia, morbid obesity, and Type 2 diabetes mellitus.         Objective:    Today's Vitals   07/27/22 1328 07/27/22 1341  BP: 117/61   Pulse: 83   Resp: 18   Temp: 98.4 F (36.9 C)   TempSrc: Oral   SpO2: 99%   Weight: (!) 305 lb 9.6 oz (138.6 kg)   Height: 5' 9"  (1.753 m)   PainSc: 1  1   PainLoc: Shoulder    Body mass index is 45.13 kg/m.     04/21/2022    6:22 AM 04/10/2022   10:27 AM 07/29/2021    7:29 PM 07/15/2021   10:24 AM 09/12/2020    3:43 PM 07/09/2020    9:45 AM  Advanced Directives  Does Patient Have a Medical Advance Directive? No No No No No No  Would patient like information on creating a medical advance directive? No - Patient declined    No - Patient declined     Current Medications (verified) Outpatient Encounter Medications as of 07/27/2022  Medication Sig   Ascorbic Acid (VITAMIN C) 1000 MG tablet Take 1,000 mg by mouth daily.   aspirin 81 MG EC tablet Take 81 mg by mouth daily.   atorvastatin (LIPITOR) 20 MG tablet TAKE 1 TABLET BY MOUTH AT BEDTIME   Bacillus Coagulans-Inulin (PROBIOTIC) 1-250 BILLION-MG CAPS Take 1 capsule by mouth daily.   busPIRone (BUSPAR) 7.5 MG tablet Take 7.5 mg by mouth 2 (two) times daily.   cetirizine (ZYRTEC) 10 MG tablet TAKE 1 TABLET(10 MG) BY MOUTH AT BEDTIME   chlorthalidone (HYGROTON) 25 MG tablet TAKE 1 TABLET(25 MG) BY MOUTH DAILY   cholecalciferol (VITAMIN D3) 25 MCG (1000 UNIT) tablet Take 1,000 Units by mouth in the morning and at bedtime.   docusate sodium (COLACE) 100 MG capsule Take 1 capsule (100 mg total) by mouth 2 (two) times daily.   FARXIGA 10 MG TABS tablet Take 10 mg by mouth daily before breakfast.   fenofibrate micronized (LOFIBRA) 134 MG  capsule TAKE 1 CAPSULE(134 MG) BY MOUTH DAILY BEFORE BREAKFAST (Patient taking differently: Take 134 mg by mouth at bedtime.)   finasteride (PROSCAR) 5 MG tablet TAKE 1 TABLET(5 MG) BY MOUTH EVERY EVENING   gabapentin (NEURONTIN) 600 MG tablet Take 600 mg by mouth in the morning, at noon, in the evening, and at bedtime.   hydrocortisone 2.5 % cream Apply to aa's of face and ears QD on Monday, Wednesday, and Friday. (Patient taking differently: Apply 1 application  topically daily as needed (irritation).)   insulin degludec (TRESIBA FLEXTOUCH) 100 UNIT/ML FlexTouch Pen Inject 68 Units into the skin at bedtime. (Patient taking differently: Inject 60 Units into the skin every evening.)   Insulin Pen Needle 31G X 5 MM MISC Use with insulin to inject into skin 3 times daily as directed   ketoconazole (NIZORAL) 2 % cream Apply to aa's face and ears QD on Tuesday, Thursday, and Saturday. (Patient taking differently: Apply 1 application  topically daily as needed for irritation.)   losartan (COZAAR) 50 MG tablet TAKE 1 TABLET(50 MG) BY MOUTH DAILY   metFORMIN (GLUCOPHAGE-XR) 500 MG 24 hr tablet TAKE 2 TABLETS(1000 MG) BY MOUTH TWICE DAILY WITH A MEAL   Multiple Vitamin (MULTIVITAMIN)  capsule Take 1 capsule by mouth daily.   NOVOLOG FLEXPEN 100 UNIT/ML FlexPen Inject 14 Units into the skin 3 (three) times daily with meals. Sliding scale   Omega-3 Fatty Acids (FISH OIL) 1200 MG CAPS Take 1,200 mg by mouth in the morning and at bedtime.   omeprazole (PRILOSEC) 20 MG capsule TAKE ONE CAPSULE BY MOUTH DAILY BEFORE BREAKFAST   Oxycodone HCl 10 MG TABS Take 1 tablet (10 mg total) by mouth every 3 (three) hours as needed. (Patient taking differently: Take 10 mg by mouth 3 (three) times daily as needed.)   OZEMPIC, 1 MG/DOSE, 4 MG/3ML SOPN Inject 1 mg into the skin once a week.   pioglitazone (ACTOS) 15 MG tablet TAKE 1 TABLET(15 MG) BY MOUTH DAILY   tamsulosin (FLOMAX) 0.4 MG CAPS capsule TAKE 1 CAPSULE BY MOUTH  EVERY NIGHT AT BEDTIME FOR URINE RETENTION   traZODone (DESYREL) 100 MG tablet Take 1 tablet (100 mg total) by mouth at bedtime. (Patient taking differently: Take 150 mg by mouth at bedtime.)   venlafaxine XR (EFFEXOR-XR) 150 MG 24 hr capsule Take 1 capsule (150 mg total) by mouth daily with breakfast.   polyethylene glycol (MIRALAX / GLYCOLAX) 17 g packet Take 17 g by mouth daily as needed for mild constipation. (Patient not taking: Reported on 07/27/2022)   traMADol (ULTRAM) 50 MG tablet Take 1 tablet (50 mg total) by mouth every 6 (six) hours as needed. (Patient not taking: Reported on 07/27/2022)   [DISCONTINUED] enoxaparin (LOVENOX) 40 MG/0.4ML injection Inject 0.4 mLs (40 mg total) into the skin daily for 14 days.   [DISCONTINUED] methocarbamol (ROBAXIN) 500 MG tablet Take 1 tablet (500 mg total) by mouth every 6 (six) hours as needed for muscle spasms. (Patient not taking: Reported on 07/27/2022)   No facility-administered encounter medications on file as of 07/27/2022.    Allergies (verified) Dust mite extract   History: Past Medical History:  Diagnosis Date   ADD (attention deficit disorder)    Anemia    Anxiety    Arthritis    Depression    Diabetes mellitus without complication (HCC)    Enlarged prostate    GERD (gastroesophageal reflux disease)    History of kidney stones    Hypertension    Knee pain    Neuromuscular disorder (HCC)    lower legs- neuropathy   Sleep apnea    Past Surgical History:  Procedure Laterality Date   APPENDECTOMY     CARPEL TUNEL Bilateral    COLONOSCOPY W/ POLYPECTOMY     HERNIA REPAIR     Left   KNEE ARTHROSCOPY     Left   RIGHT WRIST SX X2     TOTAL KNEE ARTHROPLASTY Left 04/21/2022   Procedure: TOTAL KNEE ARTHROPLASTY;  Surgeon: Hessie Knows, MD;  Location: [facility] ORS;  Service: Orthopedics;  Laterality: Left;   vascetomy     Family History  Problem Relation Age of Onset   Heart attack Mother 76   Lung cancer Father    Alzheimer's  disease Brother    Heart attack Maternal Grandfather 64   Social History   Socioeconomic History   Marital status: Married    Spouse name: Eric Andrews   Number of children: 6   Years of education: Not on file   Highest education level: Not on file  Occupational History   Occupation: retired  Tobacco Use   Smoking status: Never   Smokeless tobacco: Never  Vaping Use   Vaping Use: Never  used  Substance and Sexual Activity   Alcohol use: Not Currently   Drug use: Never   Sexual activity: Not Currently  Other Topics Concern   Not on file  Social History Narrative   Live with 2 daughters, 3 grandkids, 1 great grandskids   Social Determinants of Health   Financial Resource Strain: Low Risk  (07/27/2022)   Overall Financial Resource Strain (CARDIA)    Difficulty of Paying Living Expenses: Not hard at all  Food Insecurity: No Food Insecurity (07/27/2022)   Hunger Vital Sign    Worried About Running Out of Food in the Last Year: Never true    Ran Out of Food in the Last Year: Never true  Transportation Needs: No Transportation Needs (07/27/2022)   PRAPARE - Hydrologist (Medical): No    Lack of Transportation (Non-Medical): No  Physical Activity: Insufficiently Active (07/27/2022)   Exercise Vital Sign    Days of Exercise per Week: 3 days    Minutes of Exercise per Session: 20 min  Stress: No Stress Concern Present (07/27/2022)   [place]    Feeling of Stress : Not at all  Social Connections: [place] (07/27/2022)   Social Connection and Isolation Panel [NHANES]    Frequency of Communication with Friends and Family: More than three times a week    Frequency of Social Gatherings with Friends and Family: More than three times a week    Attends Religious Services: More than 4 times per year    Active Member of [employer] or Organizations: Yes    Attends Archivist Meetings:  Never    Marital Status: Married    Tobacco Counseling Counseling given: Not Answered No counseling needed. No history of smoking   Clinical Intake:  Pre-visit preparation completed: Yes  Pain : 0-10 Pain Score: 1  Pain Type: Chronic pain Pain Location: Knee Pain Orientation: Left Pain Descriptors / Indicators: Aching Pain Frequency: Intermittent     Nutritional Status: BMI > 30  Obese Nutritional Risks: None  How often do you need to have someone help you when you read instructions, pamphlets, or other written materials from your doctor or pharmacy?: 1 - Never  Diabetic?Nutrition Risk Assessment:  Has the patient had any N/V/D within the last 2 months?  No  Does the patient have any non-healing wounds?  No  Has the patient had any unintentional weight loss or weight gain?  No   Diabetes:  Is the patient diabetic?  Yes  If diabetic, was a CBG obtained today?  Yes  Did the patient bring in their glucometer from home?  No  How often do you monitor your CBG's? Daily .   Financial Strains and Diabetes Management:  Are you having any financial strains with the device, your supplies or your medication? No .  Does the patient want to be seen by Chronic Care Management for management of their diabetes?  No  Would the patient like to be referred to a Nutritionist or for Diabetic Management?  No   Diabetic Exams:  Diabetic Eye Exam: Completed 07/16/20. The pt reported having a recent eye exam approximately 2 weeks ago by Dr. Ellin Mayhew. I requested a copy of his most recent results.  Diabetic Foot Exam: Completed 07/08/2021. Appt scheduled with his PCP for a follow up on chronic conditions. Foot exam can be done at that appt.        Information entered by ::  Donnie Mesa, CMA   Activities of Daily Living    07/27/2022    1:33 PM 04/21/2022   11:22 AM  In your present state of health, do you have any difficulty performing the following activities:  Hearing? 0 0   Vision? 1 0  Difficulty concentrating or making decisions? 1 0  Walking or climbing stairs? 1 0  Comment neuropathy causes issues with walking   Dressing or bathing? 0 0  Doing errands, shopping? 0 0    Patient Care Team: [name], DO as PCP - General (Family Medicine) [name], Virl Diamond, RPH-CPP (Pharmacist)  Indicate any recent Medical Services you may have received from other than [facility] providers in the past year (date may be approximate).  No hospitalization in the past 12 months.    Assessment:   This is a routine wellness examination for Eric Andrews.  Hearing/Vision screen Denies any hearing issues [facility]. Wear glasses  Dietary issues and exercise activities discussed: Current Exercise Habits: Home exercise routine, Type of exercise: Other - see comments (yardwork), Time (Minutes): 20, Frequency (Times/Week): 4, Weekly Exercise (Minutes/Week): 80, Intensity: Mild Pt reports that he is eating a low carb, low sugar diet, reduce portion size, and continue improving been more active.   Goals Addressed   None    Depression Screen    07/27/2022    1:35 PM 12/17/2021    2:59 PM 07/15/2021   10:27 AM 07/08/2021    9:17 AM 04/21/2021    2:26 PM 02/27/2021    4:22 PM 10/10/2020   11:14 AM  PHQ 2/9 Scores  PHQ - 2 Score 1 1 0 2 2 2 2   PHQ- 9 Score 10 4  14 5 14 15     Fall Risk    07/27/2022    1:34 PM 12/17/2021    2:59 PM 07/15/2021   10:25 AM 07/08/2021    9:17 AM 04/21/2021    2:26 PM  Fall Risk   Falls in the past year? 1 0 1 1 1   Comment   riding bike    Number falls in past yr: 1 0 0 1 1  Injury with Fall? 1 0 0 0 0  Risk for fall due to : History of fall(s);Impaired balance/gait No Fall Risks Impaired balance/gait;Medication side effect    Follow up Falls evaluation completed Falls evaluation completed Falls evaluation completed;Education provided;Falls prevention discussed Falls evaluation completed Falls evaluation completed     FALL RISK PREVENTION PERTAINING TO THE HOME:  Any stairs in or around the home? Yes  If so, are there any without handrails? No  Home free of loose throw rugs in walkways, pet beds, electrical cords, etc? Yes  Adequate lighting in your home to reduce risk of falls? Yes   ASSISTIVE DEVICES UTILIZED TO PREVENT FALLS:  Life alert? No  Use of a cane, walker or w/c? No  Grab bars in the bathroom? Yes  Shower chair or bench in shower? No  Elevated toilet seat or a handicapped toilet? Yes   TIMED UP AND GO:  Was the test performed? Yes .  Length of time to ambulate 10 feet: 10 sec.   Gait slow and steady without use of assistive device  Cognitive Function:        07/27/2022    1:43 PM 07/15/2021   10:32 AM 07/09/2020    9:53 AM  6CIT Screen  What Year? 0 points 0 points 0 points  What month?  0 points 0 points 0 points  What time? 0 points 0 points 0 points  Count back from 20 0 points 0 points 0 points  Months in reverse 0 points 0 points 0 points  Repeat phrase 0 points 0 points 0 points  Total Score 0 points 0 points 0 points    Immunizations Immunization History  Administered Date(s) Administered   Fluad Quad(high Dose 65+) 10/10/2020, 11/06/2021   Moderna Sars-Covid-2 Vaccination 02/09/2020, 03/13/2020   PFIZER SARS-COV-2 Pediatric Vaccination 5-25yr 10/18/2020, 04/30/2021   PNEUMOCOCCAL CONJUGATE-20 11/06/2021   Tdap 01/16/2018    TDAP status: Up to date  Flu Vaccine status: Up to date  Pneumococcal vaccine status: Up to date  Covid-19 vaccine status: Completed vaccines  Qualifies for Shingles Vaccine? Yes   Zostavax completed No   Shingrix Completed?: No.    Education has been provided regarding the importance of this vaccine. Patient has been advised to call insurance company to determine out of pocket expense if they have not yet received this vaccine. Advised may also receive vaccine at local pharmacy or Health Dept. Verbalized acceptance and  understanding.  Screening Tests Health Maintenance  Topic Date Due   Hepatitis C Screening  Never done   Zoster Vaccines- Shingrix (1 of 2) Never done   COVID-19 Vaccine (3 - Moderna risk series) 05/28/2021   OPHTHALMOLOGY EXAM  07/16/2021   INFLUENZA VACCINE  07/07/2022   FOOT EXAM  07/08/2022   HEMOGLOBIN A1C  12/16/2022   COLONOSCOPY (Pts 45-456yrInsurance coverage will need to be confirmed)  02/10/2023   TETANUS/TDAP  01/17/2028   Pneumonia Vaccine 6531Years old  Completed   HPV VACCINES  Aged Out    Health Maintenance  Health Maintenance Due  Topic Date Due   Hepatitis C Screening  Never done   Zoster Vaccines- Shingrix (1 of 2) Never done   COVID-19 Vaccine (3 - Moderna risk series) 05/28/2021   OPHTHALMOLOGY EXAM  07/16/2021   INFLUENZA VACCINE  07/07/2022   FOOT EXAM  07/08/2022    Colorectal cancer screening: Referral to GI placed [facility]. Pt aware the office will call re: appt.  Lung Cancer Screening: (Low Dose CT Chest recommended if Age 70-80ears, 30 pack-year currently smoking OR have quit w/in 15years.) does not qualify.   Lung Cancer Screening Referral: not required   Additional Screening:  Hepatitis C Screening: does not qualify;  Vision Screening: Recommended annual ophthalmology exams for early detection of glaucoma and other disorders of the eye. Is the patient up to date with their annual eye exam?  Yes  Who is the provider or what is the name of the office in which the patient attends annual eye exams[facility] provider, would they like to be referred to a provider to establish care? No .   Dental Screening: Recommended annual dental exams for proper oral hygiene  Community Resource Referral / Chronic Care Management: CRR required this visit?  No   CCM required this visit?  No      Plan:     I have personally reviewed and noted the following in the patient's chart:   Medical and social  history Use of alcohol, tobacco or illicit drugs  Current medications and supplements including opioid prescriptions. Patient is currently taking opioid prescriptions. Information provided to patient regarding non-opioid alternatives. Patient advised to discuss non-opioid treatment plan with their provider. Functional ability and status Nutritional status Physical activity Advanced directives List  of other physicians Hospitalizations, surgeries, and ER visits in previous 12 months Vitals Screenings to include cognitive, depression, and falls Referrals and appointments  In addition, I have reviewed and discussed with patient certain preventive protocols, quality metrics, and best practice recommendations. A written personalized care plan for preventive services as well as general preventive health recommendations were provided to patient.    Mr. Stuard , Thank you for taking time to come for your Medicare Wellness Visit. I appreciate your ongoing commitment to your health goals. Please review the following plan we discussed and let me know if I can assist you in the future.   These are the goals we discussed:  Goals      Monitoring/follow up     -Monitor home CBGs, keep log of results and contact office for any low blood sugar readings or readings outside established parameters  -Continue to monitor home blood pressure, keep log of results and bring log with him to medical appointments  -Contact PCP in 1-2 weeks if needed for mouth symptoms or for any new or worsening medical concerns     Patient Stated     07/09/2020, wants to weigh 300 pounds     Patient Stated     07/15/2021, wants legs to work better     Pharmacy Goals     Our goal A1c is less than 7%. This corresponds with fasting sugars less than 130 and 2 hour after meal sugars less than 180. Please check your blood sugar and keep a log of the results  Please check your home blood pressure, keep a log of the results and bring  this with you to your medical appointments.  Our goal bad cholesterol, or LDL, is less than 70 . This is why it is important to continue taking your atorvastatin.  Wallace Cullens, PharmD, [place] [phone]     RNCM: Jacqlyn Larsen with Chronic Pain     Timeframe:  Long-Range Goal Priority:  Medium Start Date:       07-17-2021                      Expected End Date:    07-17-2022                   Follow Up Date 07/28/2021    - learn how to meditate - learn relaxation techniques - practice acceptance of chronic pain - practice relaxation or meditation daily - spend time with positive people - tell myself I can (not I can't) - think of new ways to do favorite things    Why is this important?   Stress makes chronic pain feel worse.  Feelings like depression, anxiety, stress and anger can make your body more sensitive to pain.  Learning ways to cope with stress or depression may help you find some relief from the pain.     Notes:      RNCM: Monitor and Manage My Blood Sugar-Diabetes Type 2     Timeframe:  Long-Range Goal Priority:  Medium Start Date:       07-17-2021                      Expected End Date:       07-17-2022                Follow Up Date 07/28/2021    - check blood sugar at prescribed times -  check blood sugar before and after exercise - check blood sugar if I feel it is too high or too low - enter blood sugar readings and medication or insulin into daily log - take the blood sugar log to all doctor visits    Why is this important?   Checking your blood sugar at home helps to keep it from getting very high or very low.  Writing the results in a diary or log helps the doctor know how to care for you.  Your blood sugar log should have the time, date and the results.  Also, write down the amount of insulin or other medicine that you take.  Other information, like what you ate, exercise done and how you were  feeling, will also be helpful.     Notes: 07-17-2021: Last hemoglobin A1C was 8.5 on 07-08-2021     RNCM: Track and Manage My Symptoms-Depression     Timeframe:  Long-Range Goal Priority:  Medium Start Date:    07-17-2021                         Expected End Date:   07-17-2022                    Follow Up Date 07/28/2021    - avoid negative self-talk - develop a personal safety plan - develop a plan to deal with triggers like holidays, anniversaries - exercise at least 2 to 3 times per week - have a plan for how to handle bad days - journal feelings and what helps to feel better or worse - spend time or talk with others at least 2 to 3 times per week - spend time or talk with others every day - watch for early signs of feeling worse    Why is this important?   Keeping track of your progress will help your treatment team find the right mix of medicine and therapy for you.  Write in your journal every day.  Day-to-day changes in depression symptoms are normal. It may be more helpful to check your progress at the end of each week instead of every day.     Notes:      SW-Manage My Emotions     Timeframe:  Long-Range Goal Priority:  Medium Start Date:  11/19/20                        Expected End Date: 02/18/20                      Follow Up Date- 90 days from 11/19/20   - begin personal counseling - call and visit an old friend - check out volunteer opportunities - join a support group - laugh; watch a funny movie or comedian - learn and use visualization or guided imagery - perform a random act of kindness - practice relaxation or meditation daily - start or continue a personal journal - talk about feelings with a friend, family or spiritual advisor - practice positive thinking and self-talk    Why is this important?   When you are stressed, down or upset, your body reacts too.  For example, your blood pressure may get higher; you may have a headache or stomachache.  When  your emotions get the best of you, your body's ability to fight off cold and flu gets weak.  These steps will help you manage your  emotions.    Objective:  Depression screen [facility] 2/9 10/10/2020 09/02/2020 07/09/2020  Decreased Interest 1 3 0  Down, Depressed, Hopeless 1 3 1   PHQ - 2 Score 2 6 1   Altered sleeping 2 3 0  Tired, decreased energy 2 3 3   Change in appetite 3 3 3   Feeling bad or failure about yourself  2 3 3   Trouble concentrating 1 2 3   Moving slowly or fidgety/restless 2 2 0  Suicidal thoughts 1 1 0  PHQ-9 Score 15 23 13   Difficult doing work/chores Somewhat difficult Somewhat difficult Somewhat difficult    GAD 7 : Generalized Anxiety Score 10/10/2020 09/02/2020 06/25/2020  Nervous, Anxious, on Edge 3 3 2   Control/stop worrying 2 3 3   Worry too much - different things 2 3 3   Trouble relaxing 3 3 3   Restless 1 1 1   Easily annoyed or irritable 3 3 1   Afraid - awful might happen 2 2 2   Total GAD 7 Score 16 18 15   Anxiety Difficulty Somewhat difficult Very difficult Somewhat difficult  Assessment, progress and current barriers:  Patient is currently experiencing symptoms of  anxiety and depression which seems to be exacerbated by his chronic pain.   Lacks knowledge of where and how to connect for mental health support. LCSW will provide Support, Education, and Care Coordination in order to meet unmet need.   Clinical Goal(s): Over the next 120 days, patient will work with SW to reduce or manage symptoms of anxiety, compulsions, depression, insomnia, mood instability, and stress until connected for ongoing counseling.   Clinical Interventions:  Assessed thoughts of SI, plan and access to means. Assessed patient's previous treatment, needs, coping skills, current treatment, support system and barriers to care Patient interviewed and appropriate assessments performed: brief mental health assessment Provided basic mental health support, education and interventions  Discussed  several options for long term counseling based on need and insurance. Assisted patient with narrowing the options down to [facility].  Reviewed mental health medications with patient prescribed by PCP and discussed compliance  Referral placed to [facility] on 11/19/20. However, when patient spoke with agency, he did not feel welcome. He states that he no longer is interested in pursing mental health resource implementation at this time. CCM LCSW re-sent email with list of mental health providers on 12/19/20 for him to revert back in the case that he changes his mind.  Assisted patient/caregiver with obtaining information about health plan benefits Provided education and assistance to client regarding Advanced Directives. Provided education to patient/caregiver about Hospice and/or Palliative Care services LCSW provided education on healthy sleep hygiene and what that looks like. LCSW encouraged patient to implement a night time routine into his schedule that works best for him and that he is able to maintain. Advised patient to implement deep breathing/grounding/meditation/self-care exercises into his nightly routine to combat racing thoughts at night. LCSW encouraged patient to wake up at the same time each day, make her sleeping environment comfortable, exercise when able, to limit naps and to not eat or drink anything right before bed.  Other interventions include:Solution-Focused Strategies implemented   Follow Up Plan:  Patient will contact CCM LCSW directly if he wishes to consider mental health support resource implementation. Education and resources sent out again on 12/19/20.          This is a list of the screening recommended for you and due dates:  Health Maintenance  Topic Date Due   Hepatitis C Screening: USPSTF Recommendation to  screen - Ages 109-79 yo.  Never done   Zoster (Shingles) Vaccine (1 of 2) Never done   COVID-19 Vaccine (3 - Moderna risk series) 05/28/2021   Eye exam for diabetics   07/16/2021   Flu Shot  07/07/2022   Complete foot exam   07/08/2022   Hemoglobin A1C  12/16/2022   Colon Cancer Screening  02/10/2023   Tetanus Vaccine  01/17/2028   Pneumonia Vaccine  Completed   HPV Vaccine  Aged [address] [place], [place]   07/27/2022   Nurse Notes: Approximately 40 minute face-to-face visit

## 2022-07-28 ENCOUNTER — Telehealth: Payer: Self-pay

## 2022-07-28 DIAGNOSIS — Z1211 Encounter for screening for malignant neoplasm of colon: Secondary | ICD-10-CM

## 2022-07-28 NOTE — Telephone Encounter (Signed)
Gastroenterology Pre-Procedure Review  Request Date: Colonoscopy Not Due Until 02/10/2023 Requesting Physician: Dr. Dellie Catholic Upon Scheduling next year.  PATIENT REVIEW QUESTIONS: The patient responded to the following health history questions as indicated:    1. Are you having any GI issues? no 2. Do you have a personal history of Polyps? Performed 02/09/18 at [facility] [place], mild sig diverticulo, no polyp,  recommended repeat in 5 yr 3. Do you have a family history of Colon Cancer or Polyps?  Colon polyps  possibly 4. Diabetes Mellitus? yes (takes metformin advised to hold 2 days prior ) 5. Joint replacements in the past 12 months?yes (3 months [facility]) 6. Major health problems in the past 3 months?no 7. Any artificial heart valves, MVP, or defibrillator?no    MEDICATIONS & ALLERGIES:    Patient reports the following regarding taking any anticoagulation/antiplatelet therapy:   Plavix, Coumadin, Eliquis, Xarelto, Lovenox, Pradaxa, Brilinta, or Effient? no Aspirin? yes (13m daily)  Patient confirms/reports the following medications:  Current Outpatient Medications  Medication Sig Dispense Refill   Ascorbic Acid (VITAMIN C) 1000 MG tablet Take 1,000 mg by mouth daily.     aspirin 81 MG EC tablet Take 81 mg by mouth daily.     atorvastatin (LIPITOR) 20 MG tablet Take 1 tablet (20 mg total) by mouth at bedtime. 90 tablet 0   Bacillus Coagulans-Inulin (PROBIOTIC) 1-250 BILLION-MG CAPS Take 1 capsule by mouth daily. 30 capsule 1   busPIRone (BUSPAR) 7.5 MG tablet Take 7.5 mg by mouth 2 (two) times daily.     cetirizine (ZYRTEC) 10 MG tablet TAKE 1 TABLET(10 MG) BY MOUTH AT BEDTIME 90 tablet 0   chlorthalidone (HYGROTON) 25 MG tablet TAKE 1 TABLET(25 MG) BY MOUTH DAILY 90 tablet 0   cholecalciferol (VITAMIN D3) 25 MCG (1000 UNIT) tablet Take 1,000 Units by mouth in the morning and at bedtime.     docusate sodium (COLACE) 100 MG capsule Take 1 capsule  (100 mg total) by mouth 2 (two) times daily. 10 capsule 0   FARXIGA 10 MG TABS tablet Take 10 mg by mouth daily before breakfast. 30 tablet 0   fenofibrate micronized (LOFIBRA) 134 MG capsule Take 1 capsule (134 mg total) by mouth daily before breakfast. TAKE 1 CAPSULE(134 MG) BY MOUTH DAILY BEFORE BREAKFAST Strength: 134 mg 90 capsule 0   finasteride (PROSCAR) 5 MG tablet TAKE 1 TABLET(5 MG) BY MOUTH EVERY EVENING 90 tablet 0   gabapentin (NEURONTIN) 600 MG tablet Take 600 mg by mouth in the morning, at noon, in the evening, and at bedtime.     hydrocortisone 2.5 % cream Apply to aa's of face and ears QD on Monday, Wednesday, and Friday. (Patient taking differently: Apply 1 application  topically daily as needed (irritation).) 28 g 6   insulin degludec (TRESIBA FLEXTOUCH) 100 UNIT/ML FlexTouch Pen Inject 68 Units into the skin at bedtime. (Patient taking differently: Inject 60 Units into the skin every evening.) 21 mL 5   Insulin Pen Needle 31G X 5 MM MISC Use with insulin to inject into skin 3 times daily as directed 400 each 3   ketoconazole (NIZORAL) 2 % cream Apply to aa's face and ears QD on Tuesday, Thursday, and Saturday. (Patient taking differently: Apply 1 application  topically daily as needed for irritation.) 30 g 6   losartan (COZAAR) 50 MG tablet TAKE 1 TABLET(50 MG) BY MOUTH DAILY 90 tablet 0   metFORMIN (GLUCOPHAGE-XR) 500 MG 24 hr tablet  TAKE 2 TABLETS(1000 MG) BY MOUTH TWICE DAILY WITH A MEAL 360 tablet 0   Multiple Vitamin (MULTIVITAMIN) capsule Take 1 capsule by mouth daily.     NOVOLOG FLEXPEN 100 UNIT/ML FlexPen Inject 14 Units into the skin 3 (three) times daily with meals. Sliding scale 15 mL 11   Omega-3 Fatty Acids (FISH OIL) 1200 MG CAPS Take 1,200 mg by mouth in the morning and at bedtime.     omeprazole (PRILOSEC) 20 MG capsule TAKE ONE CAPSULE BY MOUTH DAILY BEFORE BREAKFAST 90 capsule 0   Oxycodone HCl 10 MG TABS Take 1 tablet (10 mg total) by mouth every 3 (three)  hours as needed. (Patient taking differently: Take 10 mg by mouth 3 (three) times daily as needed.) 30 tablet 0   OZEMPIC, 1 MG/DOSE, 4 MG/3ML SOPN Inject 1 mg into the skin once a week. 12 mL 3   pioglitazone (ACTOS) 15 MG tablet TAKE 1 TABLET(15 MG) BY MOUTH DAILY 90 tablet 0   polyethylene glycol (MIRALAX / GLYCOLAX) 17 g packet Take 17 g by mouth daily as needed for mild constipation. (Patient not taking: Reported on 07/27/2022) 14 each 0   tamsulosin (FLOMAX) 0.4 MG CAPS capsule TAKE 1 CAPSULE BY MOUTH EVERY NIGHT AT BEDTIME FOR URINE RETENTION 90 capsule 0   traMADol (ULTRAM) 50 MG tablet Take 1 tablet (50 mg total) by mouth every 6 (six) hours as needed. (Patient not taking: Reported on 07/27/2022) 30 tablet 0   traZODone (DESYREL) 100 MG tablet Take 1 tablet (100 mg total) by mouth at bedtime. (Patient taking differently: Take 150 mg by mouth at bedtime.) 90 tablet 3   venlafaxine XR (EFFEXOR-XR) 150 MG 24 hr capsule Take 1 capsule (150 mg total) by mouth daily with breakfast. 90 capsule 3   No current facility-administered medications for this visit.    Patient confirms/reports the following allergies:  Allergies  Allergen Reactions   Dust Mite Extract     No orders of the defined types were placed in this encounter.   AUTHORIZATION INFORMATION Primary Insurance: 1D#: Group #:  Secondary Insurance: 1D#: Group #:  SCHEDULE INFORMATION: Date: Not Due Yet Time: Location:

## 2022-08-11 ENCOUNTER — Ambulatory Visit (INDEPENDENT_AMBULATORY_CARE_PROVIDER_SITE_OTHER): Payer: Medicare Other | Admitting: Family Medicine

## 2022-08-11 ENCOUNTER — Encounter: Payer: Self-pay | Admitting: Family Medicine

## 2022-08-11 ENCOUNTER — Other Ambulatory Visit: Payer: Self-pay | Admitting: Family Medicine

## 2022-08-11 VITALS — BP 147/75 | HR 83 | Ht 69.0 in | Wt 300.8 lb

## 2022-08-11 DIAGNOSIS — F331 Major depressive disorder, recurrent, moderate: Secondary | ICD-10-CM

## 2022-08-11 DIAGNOSIS — K219 Gastro-esophageal reflux disease without esophagitis: Secondary | ICD-10-CM

## 2022-08-11 DIAGNOSIS — J3089 Other allergic rhinitis: Secondary | ICD-10-CM

## 2022-08-11 DIAGNOSIS — I700 Atherosclerosis of aorta: Secondary | ICD-10-CM | POA: Diagnosis not present

## 2022-08-11 DIAGNOSIS — Z794 Long term (current) use of insulin: Secondary | ICD-10-CM

## 2022-08-11 DIAGNOSIS — B370 Candidal stomatitis: Secondary | ICD-10-CM

## 2022-08-11 DIAGNOSIS — I10 Essential (primary) hypertension: Secondary | ICD-10-CM

## 2022-08-11 DIAGNOSIS — K5792 Diverticulitis of intestine, part unspecified, without perforation or abscess without bleeding: Secondary | ICD-10-CM

## 2022-08-11 DIAGNOSIS — E1169 Type 2 diabetes mellitus with other specified complication: Secondary | ICD-10-CM

## 2022-08-11 DIAGNOSIS — N401 Enlarged prostate with lower urinary tract symptoms: Secondary | ICD-10-CM

## 2022-08-11 MED ORDER — TAMSULOSIN HCL 0.4 MG PO CAPS: ORAL_CAPSULE | ORAL | 3 refills | Status: DC

## 2022-08-11 MED ORDER — CETIRIZINE HCL 10 MG PO TABS: ORAL_TABLET | ORAL | 3 refills | Status: DC

## 2022-08-11 MED ORDER — LOSARTAN POTASSIUM 50 MG PO TABS: 50.0000 mg | ORAL_TABLET | Freq: Every day | ORAL | 3 refills | Status: DC

## 2022-08-11 MED ORDER — FINASTERIDE 5 MG PO TABS: ORAL_TABLET | ORAL | 3 refills | Status: DC

## 2022-08-11 MED ORDER — METFORMIN HCL ER 500 MG PO TB24: 1000.0000 mg | ORAL_TABLET | Freq: Two times a day (BID) | ORAL | 3 refills | Status: DC

## 2022-08-11 MED ORDER — OMEPRAZOLE 20 MG PO CPDR: DELAYED_RELEASE_CAPSULE | ORAL | 3 refills | Status: DC

## 2022-08-11 MED ORDER — AMOXICILLIN-POT CLAVULANATE 875-125 MG PO TABS: 1.0000 | ORAL_TABLET | Freq: Two times a day (BID) | ORAL | 0 refills | Status: DC

## 2022-08-11 MED ORDER — FLUCONAZOLE 200 MG PO TABS: 200.0000 mg | ORAL_TABLET | ORAL | 0 refills | Status: DC

## 2022-08-11 MED ORDER — FENOFIBRATE 134 MG PO CAPS: 134.0000 mg | ORAL_CAPSULE | Freq: Every day | ORAL | 3 refills | Status: DC

## 2022-08-11 MED ORDER — ATORVASTATIN CALCIUM 20 MG PO TABS: 20.0000 mg | ORAL_TABLET | Freq: Every day | ORAL | 3 refills | Status: DC

## 2022-08-11 MED ORDER — CHLORTHALIDONE 25 MG PO TABS: 25.0000 mg | ORAL_TABLET | Freq: Every day | ORAL | 3 refills | Status: DC

## 2022-08-11 MED ORDER — VENLAFAXINE HCL ER 150 MG PO CP24: 150.0000 mg | ORAL_CAPSULE | Freq: Every day | ORAL | 3 refills | Status: DC

## 2022-08-11 MED ORDER — PIOGLITAZONE HCL 15 MG PO TABS: 15.0000 mg | ORAL_TABLET | Freq: Every day | ORAL | 3 refills | Status: DC

## 2022-08-11 MED ORDER — TRAZODONE HCL 100 MG PO TABS: 150.0000 mg | ORAL_TABLET | Freq: Every day | ORAL | 3 refills | Status: AC

## 2022-08-11 NOTE — Patient Instructions (Addendum)
Thank you for coming to the office today.  Recent Labs    06/15/22 0934  HGBA1C 6.5*    Keep up the great work!  If you develop diverticulitis persistent abdominal pain can take the Augmentin antibiotic course  Use Diflucan every other day for 2 weeks for the oral thrush   Please schedule a Follow-up Appointment to: Return in about 3 months (around 11/10/2022) for 3 month DM A1c.  If you have any other questions or concerns, please feel free to call the office or send a message through [place]. You may also schedule an earlier appointment if necessary.  Additionally, you may be receiving a survey about your experience at our office within a few days to 1 week by e-mail or mail. We value your feedback.  Nobie Putnam, DO [place]

## 2022-08-11 NOTE — Progress Notes (Unsigned)
Subjective:    Patient ID: Eric Andrews, male    DOB: [birth year], 71 y.o.   MRN: [id number]  Eric Andrews is a 71 y.o. male presenting on 08/11/2022 for Follow-up, Mouth Lesions, and Diabetes   HPI  Oral Thrush Reports sore spots on tongue, bad taste, question if candida. ***  R Abdominal Pain, episodic History of Diverticulitis Episodic, temporary pain only. Follow up CT Abdomen from 07/2021 ***  Last Colonoscopy >5 years . ***  Discussion with Dr Melrose Nakayama Neurology Question about Gabapentin dosing, has been on high dose int he past 600 QID Asking about dose adjustment. ***  Atherosclerosis Aorta On CT imaging 07/2021 Stable***      07/27/2022    1:35 PM 12/17/2021    2:59 PM 07/15/2021   10:27 AM  Depression screen PHQ 2/9  Decreased Interest 0 1 0  Down, Depressed, Hopeless 1 0 0  PHQ - 2 Score 1 1 0  Altered sleeping 3 0   Tired, decreased energy 0 1   Change in appetite 0 1   Feeling bad or failure about yourself  3 1   Trouble concentrating 3 0   Moving slowly or fidgety/restless 0 0   Suicidal thoughts 0 0   PHQ-9 Score 10 4   Difficult doing work/chores Not difficult at all Not difficult at all     Social History   Tobacco Use   Smoking status: Never   Smokeless tobacco: Never  Vaping Use   Vaping Use: Never used  Substance Use Topics   Alcohol use: Not Currently   Drug use: Never    Review of Systems Per HPI unless specifically indicated above     Objective:    BP (!) 147/75   Pulse 83   Ht 5' 9"  (1.753 m)   Wt (!) 300 lb 12.8 oz (136.4 kg)   SpO2 98%   BMI 44.42 kg/m   Wt Readings from Last 3 Encounters:  08/11/22 (!) 300 lb 12.8 oz (136.4 kg)  07/27/22 (!) 305 lb 9.6 oz (138.6 kg)  04/21/22 (!) 305 lb 12.5 oz (138.7 kg)    Physical Exam Vitals and nursing note reviewed.  Constitutional:      General: He is not in acute distress.    Appearance: He is well-developed. He is not diaphoretic.     Comments: Well-appearing, comfortable,  cooperative  HENT:     Head: Normocephalic and atraumatic.     Mouth/Throat:     Comments: Oral thrush Eyes:     General:        Right eye: No discharge.        Left eye: No discharge.     Conjunctiva/sclera: Conjunctivae normal.  Neck:     Thyroid: No thyromegaly.  Cardiovascular:     Rate and Rhythm: Normal rate and regular rhythm.     Pulses: Normal pulses.     Heart sounds: Normal heart sounds. No murmur heard. Pulmonary:     Effort: Pulmonary effort is normal. No respiratory distress.     Breath sounds: Normal breath sounds. No wheezing or rales.  Musculoskeletal:        General: Normal range of motion.     Cervical back: Normal range of motion and neck supple.  Lymphadenopathy:     Cervical: No cervical adenopathy.  Skin:    General: Skin is warm and dry.     Findings: No erythema or rash.  Neurological:     Mental Status: He is alert  and oriented to person, place, and time. Mental status is at baseline.  Psychiatric:        Behavior: Behavior normal.     Comments: Well groomed, good eye contact, normal speech and thoughts     I have personally reviewed the radiology report from *** on ***.  CLINICAL DATA:  Abdominal pain and fever.   EXAM: CT ABDOMEN AND PELVIS WITH CONTRAST   TECHNIQUE: Multidetector CT imaging of the abdomen and pelvis was performed using the standard protocol following bolus administration of intravenous contrast.   CONTRAST:  138m OMNIPAQUE IOHEXOL 350 MG/ML SOLN   COMPARISON:  None.   FINDINGS: Lower chest: Small right lower lobe pneumatocele. The visualized lung bases are otherwise clear.   No intra-abdominal free air or free fluid.   Hepatobiliary: Diffuse fatty liver. No intrahepatic biliary ductal dilatation. The gallbladder is unremarkable   Pancreas: Unremarkable. No pancreatic ductal dilatation or surrounding inflammatory changes.   Spleen: Normal in size without focal abnormality.   Adrenals/Urinary Tract: The  adrenal glands unremarkable there is a 3 mm nonobstructing right renal inferior pole calculus. No hydronephrosis. There is no hydronephrosis or nephrolithiasis on the left. There is symmetric enhancement of the kidneys. The visualized ureters and urinary bladder appear unremarkable.   Stomach/Bowel: Several scattered colonic diverticula without active inflammatory changes. There is inflammatory changes and thickening of the colon primarily involving the ascending and transverse colon and to a lesser degree in the distal colon consistent with pancolitis. Correlation with clinical exam and stool cultures recommended. Loose stool noted within the colon compatible with diarrheal state. There is no bowel obstruction. Appendectomy.   Vascular/Lymphatic: Mild aortoiliac atherosclerotic disease. IVC is unremarkable no pain venous gas. There is no adenopathy.   Reproductive: Prostate and seminal vesicles are grossly unremarkable. No pelvic masses   Other: None   Musculoskeletal: None osteopenia with degenerative changes of the spine. No acute osseous pathology.   IMPRESSION: 1. Pancolitis with diarrheal state. Correlation with clinical exam and stool cultures recommended. No bowel obstruction. 2. Fatty liver. 3. A 3 mm nonobstructing right renal inferior pole calculus. No hydronephrosis. 4. Aortic Atherosclerosis (ICD10-I70.0).     Electronically Signed   By: AAnner CreteM.D.   On: 07/30/2021 02:59  Results for orders placed or performed in visit on 07/27/22  HM DIABETES EYE EXAM  Result Value Ref Range   HM Diabetic Eye Exam No Retinopathy No Retinopathy      Assessment & Plan:   Problem List Items Addressed This Visit   None Visit Diagnoses     Oral thrush    -  Primary   Relevant Medications   fluconazole (DIFLUCAN) 200 MG tablet   Diverticulitis       Relevant Medications   amoxicillin-clavulanate (AUGMENTIN) 875-125 MG tablet   Aortic atherosclerosis (HCC)            Meds ordered this encounter  Medications   fluconazole (DIFLUCAN) 200 MG tablet    Sig: Take 1 tablet (200 mg total) by mouth every other day. For 7 doses    Dispense:  7 tablet    Refill:  0   amoxicillin-clavulanate (AUGMENTIN) 875-125 MG tablet    Sig: Take 1 tablet by mouth 2 (two) times daily. For 7 days for diverticulitis, if develops    Dispense:  14 tablet    Refill:  0      Follow up plan: Return in about 3 months (around 11/10/2022) for 3 month DM A1c.  Nobie Putnam, [facility] 08/11/2022, 2:43 PM

## 2022-08-23 ENCOUNTER — Other Ambulatory Visit: Payer: Self-pay | Admitting: Family Medicine

## 2022-08-23 DIAGNOSIS — E1169 Type 2 diabetes mellitus with other specified complication: Secondary | ICD-10-CM

## 2022-08-24 NOTE — Telephone Encounter (Signed)
Unable to refill per protocol, last refill by provider 08/11/22 for 360 and 3 refills. E-Prescribing Status: Receipt confirmed by pharmacy (08/11/2022 2:57). Will refuse duplicate request.   Requested Prescriptions  Pending Prescriptions Disp Refills  . metFORMIN (GLUCOPHAGE-XR) 500 MG 24 hr tablet [Pharmacy Med Name: METFORMIN ER 500MG 24HR TABS] 360 tablet 3    Sig: TAKE 2 TABLETS(1000 MG) BY MOUTH TWICE DAILY WITH A MEAL     Endocrinology:  Diabetes - Biguanides Failed - 08/23/2022 12:21 PM      Failed - B12 Level in normal range and within 720 days    No results found for: "VITAMINB12"       Passed - Cr in normal range and within 360 days    Creat  Date Value Ref Range Status  07/01/2021 1.12 0.70 - 1.35 mg/dL Final   Creatinine, Ser  Date Value Ref Range Status  04/21/2022 1.12 0.61 - 1.24 mg/dL Final         Passed - HBA1C is between 0 and 7.9 and within 180 days    Hgb A1c MFr Bld  Date Value Ref Range Status  06/15/2022 6.5 (H) <5.7 % of total Hgb Final    Comment:    For someone without known diabetes, a hemoglobin A1c value of 6.5% or greater indicates that they may have  diabetes and this should be confirmed with a follow-up  test. . For someone with known diabetes, a value <7% indicates  that their diabetes is well controlled and a value  greater than or equal to 7% indicates suboptimal  control. A1c targets should be individualized based on  duration of diabetes, age, comorbid conditions, and  other considerations. . Currently, no consensus exists regarding use of hemoglobin A1c for diagnosis of diabetes for children. .          Passed - eGFR in normal range and within 360 days    GFR, Estimated  Date Value Ref Range Status  04/21/2022 >60 >60 mL/min Final    Comment:    (NOTE) Calculated using the CKD-EPI Creatinine Equation (2021) Performed at [facility], [address] [place], [place] [address]    eGFR  Date Value Ref Range Status   07/01/2021 71 > OR = 60 mL/min/1.56m Final    Comment:    The eGFR is based on the CKD-EPI 2021 equation. To calculate  the new eGFR from a previous Creatinine or Cystatin C result, go to https://www.kidney.org/professionals/ kdoqi/gfr%5Fcalculator          Passed - Valid encounter within last 6 months    Recent Outpatient Visits          1 week ago Oral thrush   [place]   8 months ago Chronic pain of both knees   [place]   1 year ago Intestinal infection due to enteropathogenic E. coli   [facility]   1 year ago Annual physical exam   [facility]   1 year ago [place]      Future Appointments            In 7 months KRalene Bathe MD [place]St[place] normal limits and completed in the last 12 months  WBC  Date Value Ref Range Status  04/21/2022 7.2 4.0 - 10.5 K/uL Final   RBC  Date Value Ref Range Status  04/21/2022 5.24 4.22 - 5.81 MIL/uL Final   Hemoglobin  Date Value Ref Range Status  04/21/2022 14.6 13.0 - 17.0 g/dL Final   HCT  Date Value Ref Range Status  04/21/2022 45.4 39.0 - 52.0 % Final   MCHC  Date Value Ref Range Status  04/21/2022 32.2 30.0 - 36.0 g/dL Final   [facility]  Date Value Ref Range Status  04/21/2022 27.9 26.0 - 34.0 pg Final   MCV  Date Value Ref Range Status  04/21/2022 86.6 80.0 - 100.0 fL Final   No results found for: "PLTCOUNTKUC", "LABPLAT", "POCPLA" RDW  Date Value Ref Range Status  04/21/2022 13.9 11.5 - 15.5 % Final

## 2022-08-26 ENCOUNTER — Ambulatory Visit: Payer: Self-pay

## 2022-08-26 ENCOUNTER — Telehealth: Payer: Self-pay | Admitting: Family Medicine

## 2022-08-26 DIAGNOSIS — M5136 Other intervertebral disc degeneration, lumbar region: Secondary | ICD-10-CM

## 2022-08-26 DIAGNOSIS — B370 Candidal stomatitis: Secondary | ICD-10-CM

## 2022-08-26 DIAGNOSIS — M159 Polyosteoarthritis, unspecified: Secondary | ICD-10-CM

## 2022-08-26 MED ORDER — NYSTATIN 100000 UNIT/ML MT SUSP: 5.0000 mL | Freq: Four times a day (QID) | OROMUCOSAL | 0 refills | Status: DC

## 2022-08-26 NOTE — Telephone Encounter (Signed)
Please notify patient that I will send Nystatin oral solution. Take 4 times a day for 14 days, swish for few minutes in mouth and swallow.  Sent rx to [place] 08/26/2022, 12:50 PM

## 2022-08-26 NOTE — Telephone Encounter (Signed)
Requested medication (s) are due for refill today - no  Requested medication (s) are on the active medication list -no  Future visit scheduled -no  Last refill: 04/01/22  Notes to clinic: medication no longer listed on current medication list- sent for provider review   Requested Prescriptions  Pending Prescriptions Disp Refills   celecoxib (CELEBREX) 200 MG capsule [Pharmacy Med Name: CELECOXIB 200MG CAPSULES] 180 capsule 0    Sig: TAKE 1 CAPSULE(200 MG) BY MOUTH TWICE DAILY     Analgesics:  COX2 Inhibitors Failed - 08/26/2022  9:06 AM      Failed - Manual Review: Labs are only required if the patient has taken medication for more than 8 weeks.      Failed - ALT in normal range and within 360 days    ALT  Date Value Ref Range Status  04/10/2022 51 (H) 0 - 44 U/L Final         Passed - HGB in normal range and within 360 days    Hemoglobin  Date Value Ref Range Status  04/21/2022 14.6 13.0 - 17.0 g/dL Final         Passed - Cr in normal range and within 360 days    Creat  Date Value Ref Range Status  07/01/2021 1.12 0.70 - 1.35 mg/dL Final   Creatinine, Ser  Date Value Ref Range Status  04/21/2022 1.12 0.61 - 1.24 mg/dL Final         Passed - HCT in normal range and within 360 days    HCT  Date Value Ref Range Status  04/21/2022 45.4 39.0 - 52.0 % Final         Passed - AST in normal range and within 360 days    AST  Date Value Ref Range Status  04/10/2022 39 15 - 41 U/L Final         Passed - eGFR is 30 or above and within 360 days    GFR, Estimated  Date Value Ref Range Status  04/21/2022 >60 >60 mL/min Final    Comment:    (NOTE) Calculated using the CKD-EPI Creatinine Equation (2021) Performed at [facility], [address] [place], [place] [address]    eGFR  Date Value Ref Range Status  07/01/2021 71 > OR = 60 mL/min/1.44m Final    Comment:    The eGFR is based on the CKD-EPI 2021 equation. To calculate  the new eGFR from a previous  Creatinine or Cystatin C result, go to https://www.kidney.org/professionals/ kdoqi/gfr%5Fcalculator          Passed - Patient is not pregnant      Passed - Valid encounter within last 12 months    Recent Outpatient Visits           2 weeks ago Oral thrush   [place]   8 months ago Chronic pain of both knees   [facility] [address] [facility]   1 year ago Intestinal infection due to enteropathogenic E. coli   [facility]   1 year ago Annual physical exam   [facility]   1 year ago [place]       Future Appointments             In 7 months KRalene Bathe MD [place]  Requested Prescriptions  Pending Prescriptions Disp Refills   celecoxib (CELEBREX) 200 MG capsule [Pharmacy Med Name: CELECOXIB 200MG CAPSULES] 180 capsule 0    Sig: TAKE 1 CAPSULE(200 MG) BY MOUTH TWICE DAILY     Analgesics:  COX2 Inhibitors Failed - 08/26/2022  9:06 AM      Failed - Manual Review: Labs are only required if the patient has taken medication for more than 8 weeks.      Failed - ALT in normal range and within 360 days    ALT  Date Value Ref Range Status  04/10/2022 51 (H) 0 - 44 U/L Final         Passed - HGB in normal range and within 360 days    Hemoglobin  Date Value Ref Range Status  04/21/2022 14.6 13.0 - 17.0 g/dL Final         Passed - Cr in normal range and within 360 days    Creat  Date Value Ref Range Status  07/01/2021 1.12 0.70 - 1.35 mg/dL Final   Creatinine, Ser  Date Value Ref Range Status  04/21/2022 1.12 0.61 - 1.24 mg/dL Final         Passed - HCT in normal range and within 360 days    HCT  Date Value Ref Range Status  04/21/2022 45.4 39.0 - 52.0 % Final         Passed - AST in normal range and within 360  days    AST  Date Value Ref Range Status  04/10/2022 39 15 - 41 U/L Final         Passed - eGFR is 30 or above and within 360 days    GFR, Estimated  Date Value Ref Range Status  04/21/2022 >60 >60 mL/min Final    Comment:    (NOTE) Calculated using the CKD-EPI Creatinine Equation (2021) Performed at [facility], [address] [place], [place] [address]    eGFR  Date Value Ref Range Status  07/01/2021 71 > OR = 60 mL/min/1.80m Final    Comment:    The eGFR is based on the CKD-EPI 2021 equation. To calculate  the new eGFR from a previous Creatinine or Cystatin C result, go to https://www.kidney.org/professionals/ kdoqi/gfr%5Fcalculator          Passed - Patient is not pregnant      Passed - Valid encounter within last 12 months    Recent Outpatient Visits           2 weeks ago Oral thrush   [place]   8 months ago Chronic pain of both knees   [facility]   1 year ago Intestinal infection due to enteropathogenic E. coli   [facility]   1 year ago Annual physical exam   [facility]   1 year ago [place] DO       Future Appointments             In 7 months KRalene Bathe MD [place]

## 2022-08-26 NOTE — Telephone Encounter (Signed)
Patient aware.

## 2022-08-26 NOTE — Telephone Encounter (Signed)
  Chief Complaint: oral thrush Symptoms: white patches in mouth, on tongue, sensitive area on tip of tongue Frequency: since 08/11/22 Pertinent Negatives: NA Disposition: [] ED /[] Urgent Care (no appt availability in office) / [] Appointment(In office/virtual)/ []  [facility]/ [] Home Care/ [] Refused Recommended Disposition /[] [facility]/ [x]  Follow-up with PCP Additional Notes: pt was seen on 08/11/22 and given Diflucan EOD x 7 days. Pt completed on Monday 08/24/22 and states thrush got better but now is back just as bad if not worse. Pt preferred to see if Dr. Raliegh Ip can send in Nystatin mouthwash since he has had good experience with that in the past. Advised him I would send message back and if any questions would give CB. Pt verbalized understanding.   Summary: Trush?   Patient states that he was seen by pcp on 9/5 and told he had oral thrush. Patient was given Diflucan and finished them on 9/18. Patient states that the thrush was getting better but now has come back worse than before he was seen. No available appointments until 9/22. Please advise patient.      Reason for Disposition  [1] White patches that stick to tongue or inner cheek AND [2] can be wiped off  Answer Assessment - Initial Assessment Questions 1. SYMPTOM: "What's the main symptom you're concerned about?" (e.g., chapped lips, dry mouth, lump, sores)     thrush 2. ONSET: "When did the  sx  start?"     08/11/22 3. PAIN: "Is there any pain?" If Yes, ask: "How bad is it?" (Scale: 1-10; mild, moderate, severe)   - MILD (1-3):  doesn't interfere with eating or normal activities   - MODERATE (4-7): interferes with eating some solids and normal activities   - SEVERE (8-10):  excruciating pain, interferes with most normal activities   - SEVERE DYSPHAGIA: can't swallow liquids, drooling     Sensitive  4. CAUSE: "What do you think is causing the symptoms?"     Oral thrush 5. OTHER SYMPTOMS: "Do you have any other  symptoms?" (e.g., fever, sore throat, toothache, swelling)     White patches in mouth and on tongue  Protocols used: Mouth Symptoms-A-AH

## 2022-08-26 NOTE — Addendum Note (Signed)
Addended by: [name] on: 08/26/2022 12:50 PM   Modules accepted: Orders

## 2022-08-28 MED ORDER — CELECOXIB 200 MG PO CAPS: 200.0000 mg | ORAL_CAPSULE | Freq: Two times a day (BID) | ORAL | 1 refills | Status: DC

## 2022-08-28 NOTE — Addendum Note (Signed)
Addended by: [name] on: 08/28/2022 04:41 PM   Modules accepted: Orders

## 2022-08-28 NOTE — Telephone Encounter (Signed)
Pt called to inquire why his refill request for Celecoxib was denied / it shows it was denied due to prescriber not working at this office / this medication has been prescribed to pt from Dr. Raliegh Ip / please advise about refill and send to [facility] in [place]

## 2022-09-01 DIAGNOSIS — R200 Anesthesia of skin: Secondary | ICD-10-CM | POA: Diagnosis not present

## 2022-09-01 DIAGNOSIS — G479 Sleep disorder, unspecified: Secondary | ICD-10-CM | POA: Diagnosis not present

## 2022-09-01 DIAGNOSIS — M79602 Pain in left arm: Secondary | ICD-10-CM | POA: Diagnosis not present

## 2022-09-01 DIAGNOSIS — M542 Cervicalgia: Secondary | ICD-10-CM | POA: Diagnosis not present

## 2022-09-01 DIAGNOSIS — R2681 Unsteadiness on feet: Secondary | ICD-10-CM | POA: Diagnosis not present

## 2022-09-01 DIAGNOSIS — M79661 Pain in right lower leg: Secondary | ICD-10-CM | POA: Diagnosis not present

## 2022-09-01 DIAGNOSIS — M79662 Pain in left lower leg: Secondary | ICD-10-CM | POA: Diagnosis not present

## 2022-09-01 DIAGNOSIS — G4733 Obstructive sleep apnea (adult) (pediatric): Secondary | ICD-10-CM | POA: Diagnosis not present

## 2022-09-03 ENCOUNTER — Telehealth: Payer: Self-pay

## 2022-09-03 DIAGNOSIS — Z0000 Encounter for general adult medical examination without abnormal findings: Secondary | ICD-10-CM

## 2022-09-03 DIAGNOSIS — R351 Nocturia: Secondary | ICD-10-CM

## 2022-09-03 DIAGNOSIS — N401 Enlarged prostate with lower urinary tract symptoms: Secondary | ICD-10-CM

## 2022-09-03 DIAGNOSIS — Z794 Long term (current) use of insulin: Secondary | ICD-10-CM

## 2022-09-03 DIAGNOSIS — I10 Essential (primary) hypertension: Secondary | ICD-10-CM

## 2022-09-03 DIAGNOSIS — F331 Major depressive disorder, recurrent, moderate: Secondary | ICD-10-CM

## 2022-09-03 DIAGNOSIS — E1169 Type 2 diabetes mellitus with other specified complication: Secondary | ICD-10-CM

## 2022-09-03 DIAGNOSIS — I700 Atherosclerosis of aorta: Secondary | ICD-10-CM

## 2022-09-03 NOTE — Telephone Encounter (Signed)
Last A1c 06/2022  I saw him on 08/11/22 for other reasons. I advised him at that time, we could wait until 3 more months to do his labs with A1c. So I suggested early December 2023.  Since I just saw him early Sept. I am okay if he would like to schedule Fasting Lab in late Nov / early December and then return to see me 1 week later.  He is due for all physical labs including cholesterol. I would say he can schedule it as an Annual Physical in December.  Future lab Orders are in  Thanks!  Nobie Putnam, [facility] 09/03/2022, 3:58 PM

## 2022-09-03 NOTE — Telephone Encounter (Signed)
Copied from [place] [id number]. Topic: Appointment Scheduling - Scheduling Inquiry for Clinic >> Sep 02, 2022  4:57 PM Erskine Squibb wrote: Reason for CRM: The patient called in stating he received a call to schedule lab work to check his A1C .   --He said he received an automated phone call telling him he was due to have his A1c checked. I told him I'd have to let him know what Dr. Raliegh Ip said.  Looks like his three month A1c check is due in Oct but he doesn't have an upcoming appt with [place].   Should he come in for the lab?

## 2022-09-16 ENCOUNTER — Encounter: Payer: Self-pay | Admitting: Pulmonary Disease

## 2022-09-16 ENCOUNTER — Ambulatory Visit: Payer: Medicare Other | Admitting: Pulmonary Disease

## 2022-09-16 VITALS — BP 120/82 | HR 76 | Temp 97.5°F | Ht 69.0 in | Wt 304.6 lb

## 2022-09-16 DIAGNOSIS — Z96652 Presence of left artificial knee joint: Secondary | ICD-10-CM | POA: Diagnosis not present

## 2022-09-16 DIAGNOSIS — R0602 Shortness of breath: Secondary | ICD-10-CM

## 2022-09-16 DIAGNOSIS — G4733 Obstructive sleep apnea (adult) (pediatric): Secondary | ICD-10-CM | POA: Diagnosis not present

## 2022-09-16 DIAGNOSIS — Z6841 Body Mass Index (BMI) 40.0 and over, adult: Secondary | ICD-10-CM

## 2022-09-16 NOTE — Patient Instructions (Signed)
Compliance with the CPAP is excellent.  We will see you in follow-up in 6 months time call sooner should any new problems arise.   Su uso de la CPAP es excelente.  Nos veremos en cita de seguimiento dentro de 6 meses ; por favor llamar antes si surge algn problema nuevo.

## 2022-09-16 NOTE — Progress Notes (Signed)
Subjective:    Patient ID: Eric Andrews, male    DOB: 1951-02-04, 71 y.o.   MRN: [id number] Patient Care Team: [name], DO as PCP - General (Family Medicine) [name], Virl Diamond, RPH-CPP (Pharmacist)  Chief Complaint  Patient presents with   Follow-up    OSA (obstructive sleep apnea). No SOB, wheezing or cough.    HPI Patient is a 71 year old lifelong never smoker with a history of obstructive sleep apnea and issues with dyspnea who follows for the same.  This is a scheduled visit.  He was last seen here on 12 March 2021.  Patient is maintained on an auto CPAP of 5 to 20 cm H2O and tolerating it well.  He states that he gets restful sleep and is able to sleep all night with it.  His compliance is 100% by his report which is from 58 September through 10 October, average pressures are around 9 to 11 cm of water pressure. AHI  events are controlled.   With regards to his dyspnea he has grade 1 diastolic dysfunction noted on 2D echo performed in November 2021 with LVEF of 55 to 60%.  PFTs performed on November 2021 showed relatively normal flows and volumes with a very severely decreased ERV at 2% consistent with obesity. Diffusion capacity was normal.  Suspect that most of his issues are due to obesity.  Weight loss has been recommended.  There is no evidence of obstructive lung disease.  Patient notes that since he has been on the CPAP his dyspnea has been markedly better.  He had left knee replacement surgery on 21 Apr 2022.  He states that he got quite deconditioned during the recovery.  He has now completed rehab as outpatient.  He feels overall that he is doing well.  Continues to struggle with his weight issues.   Review of Systems A 10 point review of systems was performed and it is as noted above otherwise negative.  Patient Active Problem List   Diagnosis Date Noted   S/P TKR (total knee replacement) using cement, left 04/21/2022   Bilateral primary osteoarthritis  of knee 12/17/2021   Gastroesophageal reflux disease without esophagitis 07/08/2021   Environmental and seasonal allergies 02/27/2021   Hyperlipidemia associated with type 2 diabetes mellitus ([place]) 02/27/2021   Morbid obesity with BMI of 40.0-44.9, adult ([place]) 06/26/2020   Major depressive disorder, recurrent, moderate ([place]) 06/25/2020   GAD (generalized anxiety disorder) 06/25/2020   Type 2 diabetes mellitus with other specified complication ([place]) 59/56/3875   Chronic pain of both knees 05/27/2020   Primary osteoarthritis involving multiple joints 05/27/2020   Cataract associated with type 2 diabetes mellitus ([place]) 05/27/2020   History of torn meniscus of left knee 05/27/2020   DDD (degenerative disc disease), lumbar 05/27/2020   Social History   Tobacco Use   Smoking status: Never   Smokeless tobacco: Never  Substance Use Topics   Alcohol use: Not Currently   Allergies  Allergen Reactions   Dust Mite Extract    Current Meds  Medication Sig   Ascorbic Acid (VITAMIN C) 1000 MG tablet Take 1,000 mg by mouth daily.   aspirin 81 MG EC tablet Take 81 mg by mouth daily.   atorvastatin (LIPITOR) 20 MG tablet Take 1 tablet (20 mg total) by mouth at bedtime.   busPIRone (BUSPAR) 7.5 MG tablet Take 7.5 mg by mouth 2 (two) times daily.   celecoxib (CELEBREX) 200 MG capsule Take 1 capsule (200 mg total) by mouth 2 (  two) times daily.   cetirizine (ZYRTEC) 10 MG tablet TAKE 1 TABLET(10 MG) BY MOUTH AT BEDTIME   chlorthalidone (HYGROTON) 25 MG tablet Take 1 tablet (25 mg total) by mouth daily.   cholecalciferol (VITAMIN D3) 25 MCG (1000 UNIT) tablet Take 1,000 Units by mouth in the morning and at bedtime.   FARXIGA 10 MG TABS tablet Take 10 mg by mouth daily before breakfast.   fenofibrate micronized (LOFIBRA) 134 MG capsule Take 1 capsule (134 mg total) by mouth daily before breakfast. TAKE 1 CAPSULE(134 MG) BY MOUTH DAILY BEFORE BREAKFAST   finasteride (PROSCAR) 5 MG tablet TAKE 1 TABLET(5  MG) BY MOUTH EVERY EVENING   gabapentin (NEURONTIN) 600 MG tablet Take 600 mg by mouth in the morning, at noon, in the evening, and at bedtime.   hydrocortisone 2.5 % cream Apply to aa's of face and ears QD on Monday, Wednesday, and Friday. (Patient taking differently: Apply 1 application  topically daily as needed (irritation).)   insulin degludec (TRESIBA FLEXTOUCH) 100 UNIT/ML FlexTouch Pen Inject 68 Units into the skin at bedtime. (Patient taking differently: Inject 60 Units into the skin every evening.)   Insulin Pen Needle 31G X 5 MM MISC Use with insulin to inject into skin 3 times daily as directed   ketoconazole (NIZORAL) 2 % cream Apply to aa's face and ears QD on Tuesday, Thursday, and Saturday. (Patient taking differently: Apply 1 application  topically daily as needed for irritation.)   losartan (COZAAR) 50 MG tablet Take 1 tablet (50 mg total) by mouth daily.   metFORMIN (GLUCOPHAGE-XR) 500 MG 24 hr tablet Take 2 tablets (1,000 mg total) by mouth 2 (two) times daily with a meal. Add refills for future   Multiple Vitamin (MULTIVITAMIN) capsule Take 1 capsule by mouth daily.   NOVOLOG FLEXPEN 100 UNIT/ML FlexPen Inject 14 Units into the skin 3 (three) times daily with meals. Sliding scale   Omega-3 Fatty Acids (FISH OIL) 1200 MG CAPS Take 1,200 mg by mouth in the morning and at bedtime.   omeprazole (PRILOSEC) 20 MG capsule TAKE ONE CAPSULE BY MOUTH DAILY BEFORE BREAKFAST   OZEMPIC, 1 MG/DOSE, 4 MG/3ML SOPN Inject 1 mg into the skin once a week.   pioglitazone (ACTOS) 15 MG tablet Take 1 tablet (15 mg total) by mouth daily.   tamsulosin (FLOMAX) 0.4 MG CAPS capsule TAKE 1 CAPSULE BY MOUTH EVERY NIGHT AT BEDTIME FOR URINE RETENTION   traMADol (ULTRAM) 50 MG tablet Take 1 tablet (50 mg total) by mouth every 6 (six) hours as needed. (Patient taking differently: Take 50 mg by mouth daily.)   traZODone (DESYREL) 100 MG tablet Take 1.5 tablets (150 mg total) by mouth at bedtime.   venlafaxine  XR (EFFEXOR-XR) 150 MG 24 hr capsule Take 1 capsule (150 mg total) by mouth daily with breakfast.   Immunization History  Administered Date(s) Administered   Fluad Quad(high Dose 65+) 10/10/2020, 11/06/2021   Moderna Sars-Covid-2 Vaccination 02/09/2020, 03/13/2020   PFIZER SARS-COV-2 Pediatric Vaccination 5-75yr 10/18/2020, 04/30/2021   PNEUMOCOCCAL CONJUGATE-20 11/06/2021   Tdap 01/16/2018       Objective:   Physical Exam BP 120/82 (BP Location: Left Arm, Cuff Size: Large)   Pulse 76   Temp (!) 97.5 F (36.4 C)   Ht 5' 9"  (1.753 m)   Wt (!) 304 lb 9.6 oz (138.2 kg)   SpO2 94%   BMI 44.98 kg/m  GENERAL: Morbidly obese man, no acute distress.  Fully ambulatory. HEAD: Normocephalic, atraumatic.  EYES: Pupils equal, round, reactive to light.  No scleral icterus.  MOUTH: Oral mucosa moist, no thrush. NECK: Supple. No thyromegaly. Trachea midline. No JVD.  No adenopathy. PULMONARY: Good air entry bilaterally.  No adventitious sounds. CARDIOVASCULAR: S1 and S2. Regular rate and rhythm.  ABDOMEN: Massive truncal obesity. MUSCULOSKELETAL: No joint deformity, no clubbing, no edema.  NEUROLOGIC: No focal deficit, no gait disturbance, speech is fluent. SKIN: Intact,warm,dry.  No rashes noted. PSYCH: Mood and behavior normal      Assessment & Plan:     ICD-10-CM   1. OSA (obstructive sleep apnea)  G47.33    Compliant with auto CPAP at 5 to 20 cm H2O Continue same Follow-up 6 months    2. Shortness of breath  R06.02    Markedly improved on CPAP Main issues are due to obesity/deconditioning    3. Morbid obesity with BMI of 40.0-44.9, adult ([facility])  E66.01    Z68.41    Recommend weight loss This issue adds complexity to his management    4. S/P TKR (total knee replacement) using cement, left  D47.185    Has completed rehab     Patient had perfect compliance noted on the Pap download.  Recommend continuation of CPAP.  We will see the patient in follow-up in 6 months time he  is to contact us prior to that time should any new difficulties arise.  Renold Don, MD Advanced Bronchoscopy PCCM [facility]    *This note was dictated using voice recognition software/Dragon.  Despite best efforts to proofread, errors can occur which can change the meaning. Any transcriptional errors that result from this process are unintentional and may not be fully corrected at the time of dictation.

## 2022-09-24 ENCOUNTER — Ambulatory Visit
Admission: EM | Admit: 2022-09-24 | Discharge: 2022-09-24 | Disposition: A | Discharge status: Home / Self Care | Payer: Medicare Other | Attending: Physician Assistant | Admitting: Physician Assistant

## 2022-09-24 ENCOUNTER — Other Ambulatory Visit: Payer: Self-pay

## 2022-09-24 DIAGNOSIS — N200 Calculus of kidney: Secondary | ICD-10-CM | POA: Diagnosis not present

## 2022-09-24 LAB — URINALYSIS, MICROSCOPIC (REFLEX)
Bacteria, UA: NONE SEEN
Squamous Epithelial / HPF: NONE SEEN (ref 0–5)
WBC, UA: NONE SEEN WBC/hpf (ref 0–5)

## 2022-09-24 LAB — URINALYSIS, ROUTINE W REFLEX MICROSCOPIC
Bilirubin Urine: NEGATIVE
Glucose, UA: 500 mg/dL — AB
Ketones, ur: NEGATIVE mg/dL
Leukocytes,Ua: NEGATIVE
Nitrite: NEGATIVE
Protein, ur: NEGATIVE mg/dL
Specific Gravity, Urine: 1.015 (ref 1.005–1.030)
pH: 7 (ref 5.0–8.0)

## 2022-09-24 NOTE — ED Provider Notes (Signed)
[facility]    CSN: [id number] Arrival date & time: 09/24/22  1048      History   Chief Complaint Chief Complaint  Patient presents with   Flank Pain   Urinary Frequency    HPI Eric Andrews is a 71 y.o. male.   Patient presents for evaluation of right-sided flank pain, urinary frequency, urgency, mild dysuria and nausea and vomiting beginning this morning.  Endorses that he had severe right-sided flank pain that occurred 2 weeks ago, symptoms lasted for 24 hours before resolution.  Nausea and vomiting have already begun to subside today.  Has not attempted to eat.  Endorses history of kidney stones.  Manage pain with old prescription of oxycodone 5 mg which was effective.  Currently taking Flomax for urinary retention.  Denies penile discharge, penile or testicle swelling, fever, chills.    Past Medical History:  Diagnosis Date   ADD (attention deficit disorder)    Anemia    Anxiety    Arthritis    Depression    Diabetes mellitus without complication ([place])    Enlarged prostate    GERD (gastroesophageal reflux disease)    History of kidney stones    Hypertension    Knee pain    Neuromuscular disorder (HCC)    lower legs- neuropathy   Sleep apnea     Patient Active Problem List   Diagnosis Date Noted   S/P TKR (total knee replacement) using cement, left 04/21/2022   Bilateral primary osteoarthritis of knee 12/17/2021   Gastroesophageal reflux disease without esophagitis 07/08/2021   Environmental and seasonal allergies 02/27/2021   Hyperlipidemia associated with type 2 diabetes mellitus ([place]) 02/27/2021   Morbid obesity with BMI of 40.0-44.9, adult ([place]) 06/26/2020   Major depressive disorder, recurrent, moderate ([place]) 06/25/2020   GAD (generalized anxiety disorder) 06/25/2020   Type 2 diabetes mellitus with other specified complication ([place]) 28/78/6767   Chronic pain of both knees 05/27/2020   Primary osteoarthritis involving multiple joints 05/27/2020    Cataract associated with type 2 diabetes mellitus ([place]) 05/27/2020   History of torn meniscus of left knee 05/27/2020   DDD (degenerative disc disease), lumbar 05/27/2020    Past Surgical History:  Procedure Laterality Date   APPENDECTOMY     CARPEL TUNEL Bilateral    COLONOSCOPY W/ POLYPECTOMY     HERNIA REPAIR     Left   KNEE ARTHROSCOPY     Left   RIGHT WRIST SX X2     TOTAL KNEE ARTHROPLASTY Left 04/21/2022   Procedure: TOTAL KNEE ARTHROPLASTY;  Surgeon: Hessie Knows, MD;  Location: [facility] ORS;  Service: Orthopedics;  Laterality: Left;   vascetomy         Home Medications    Prior to Admission medications   Medication Sig Start Date End Date Taking? Authorizing Provider  amoxicillin-clavulanate (AUGMENTIN) 875-125 MG tablet Take 1 tablet by mouth 2 (two) times daily. For 7 days for diverticulitis, if develops Patient not taking: Reported on 09/16/2022 08/11/22   [name], DO  Ascorbic Acid (VITAMIN C) 1000 MG tablet Take 1,000 mg by mouth daily.    [provider]  aspirin 81 MG EC tablet Take 81 mg by mouth daily.    [provider]  atorvastatin (LIPITOR) 20 MG tablet Take 1 tablet (20 mg total) by mouth at bedtime. 08/11/22   Karamalegos, [name], DO  Bacillus Coagulans-Inulin (PROBIOTIC) 1-250 BILLION-MG CAPS Take 1 capsule by mouth daily. 07/30/21   Ward, Delice Bison, DO  busPIRone (BUSPAR) 7.5 MG tablet Take 7.5 mg by mouth 2 (two) times daily. 03/23/22   [provider]  celecoxib (CELEBREX) 200 MG capsule Take 1 capsule (200 mg total) by mouth 2 (two) times daily. 08/28/22   Karamalegos, [name], DO  cetirizine (ZYRTEC) 10 MG tablet TAKE 1 TABLET(10 MG) BY MOUTH AT BEDTIME 08/11/22   Karamalegos, [name], DO  chlorthalidone (HYGROTON) 25 MG tablet Take 1 tablet (25 mg total) by mouth daily. 08/11/22   Karamalegos, [name], DO  cholecalciferol (VITAMIN D3) 25 MCG (1000 UNIT) tablet Take 1,000 Units by mouth in the morning  and at bedtime.    [provider]  docusate sodium (COLACE) 100 MG capsule Take 1 capsule (100 mg total) by mouth 2 (two) times daily. Patient not taking: Reported on 09/16/2022 04/22/22   Duanne Guess, PA-C  FARXIGA 10 MG TABS tablet Take 10 mg by mouth daily before breakfast. 01/21/21   [name], [name], DO  fenofibrate micronized (LOFIBRA) 134 MG capsule Take 1 capsule (134 mg total) by mouth daily before breakfast. TAKE 1 CAPSULE(134 MG) BY MOUTH DAILY BEFORE BREAKFAST 08/11/22   Karamalegos, [name], DO  finasteride (PROSCAR) 5 MG tablet TAKE 1 TABLET(5 MG) BY MOUTH EVERY EVENING 08/11/22   Karamalegos, [name], DO  fluconazole (DIFLUCAN) 200 MG tablet Take 1 tablet (200 mg total) by mouth every other day. For 7 doses Patient not taking: Reported on 09/16/2022 08/11/22   [name], DO  gabapentin (NEURONTIN) 600 MG tablet Take 600 mg by mouth in the morning, at noon, in the evening, and at bedtime. 10/21/21 10/21/22  [provider]  hydrocortisone 2.5 % cream Apply to aa's of face and ears QD on Monday, Wednesday, and Friday. Patient taking differently: Apply 1 application  topically daily as needed (irritation). 09/22/21   Ralene Bathe, MD  insulin degludec (TRESIBA FLEXTOUCH) 100 UNIT/ML FlexTouch Pen Inject 68 Units into the skin at bedtime. Patient taking differently: Inject 60 Units into the skin every evening. 01/29/22   [name], [name], DO  Insulin Pen Needle 31G X 5 MM MISC Use with insulin to inject into skin 3 times daily as directed 10/10/20   [name], DO  ketoconazole (NIZORAL) 2 % cream Apply to aa's face and ears QD on Tuesday, Thursday, and Saturday. Patient taking differently: Apply 1 application  topically daily as needed for irritation. 09/22/21   Ralene Bathe, MD  losartan (COZAAR) 50 MG tablet Take 1 tablet (50 mg total) by mouth daily. 08/11/22   Karamalegos, [name], DO  metFORMIN  (GLUCOPHAGE-XR) 500 MG 24 hr tablet Take 2 tablets (1,000 mg total) by mouth 2 (two) times daily with a meal. Add refills for future 08/11/22   [name], DO  Multiple Vitamin (MULTIVITAMIN) capsule Take 1 capsule by mouth daily.    [provider]  NOVOLOG FLEXPEN 100 UNIT/ML FlexPen Inject 14 Units into the skin 3 (three) times daily with meals. Sliding scale 03/11/21   Karamalegos, [name], DO  nystatin (MYCOSTATIN) 100000 UNIT/ML suspension Use as directed 5 mLs (500,000 Units total) in the mouth or throat 4 (four) times daily. Swish in mouth and hold for several minutes before swallowing. For 14 days. Patient not taking: Reported on 09/16/2022 08/26/22   [name], DO  Omega-3 Fatty Acids (FISH OIL) 1200 MG CAPS Take 1,200 mg by mouth in the morning and at bedtime.    [provider]  omeprazole (  PRILOSEC) 20 MG capsule TAKE ONE CAPSULE BY MOUTH DAILY BEFORE BREAKFAST 08/11/22   Karamalegos, [name], DO  Oxycodone HCl 10 MG TABS Take 1 tablet (10 mg total) by mouth every 3 (three) hours as needed. 04/24/22   Duanne Guess, PA-C  OZEMPIC, 1 MG/DOSE, 4 MG/3ML SOPN Inject 1 mg into the skin once a week. 01/29/22   Karamalegos, [name], DO  pioglitazone (ACTOS) 15 MG tablet Take 1 tablet (15 mg total) by mouth daily. 08/11/22   Karamalegos, [name], DO  polyethylene glycol (MIRALAX / GLYCOLAX) 17 g packet Take 17 g by mouth daily as needed for mild constipation. Patient not taking: Reported on 09/16/2022 04/21/22   Duanne Guess, PA-C  tamsulosin (FLOMAX) 0.4 MG CAPS capsule TAKE 1 CAPSULE BY MOUTH EVERY NIGHT AT BEDTIME FOR URINE RETENTION 08/11/22   [name], [name], DO  traMADol (ULTRAM) 50 MG tablet Take 1 tablet (50 mg total) by mouth every 6 (six) hours as needed. Patient taking differently: Take 50 mg by mouth daily. 04/24/22   Duanne Guess, PA-C  traZODone (DESYREL) 100 MG tablet Take 1.5 tablets (150 mg total) by mouth at  bedtime. 08/11/22   [name], [name], DO  venlafaxine XR (EFFEXOR-XR) 150 MG 24 hr capsule Take 1 capsule (150 mg total) by mouth daily with breakfast. 08/11/22   [name], DO    Family History Family History  Problem Relation Age of Onset   Heart attack Mother 56   Lung cancer Father    Alzheimer's disease Brother    Heart attack Maternal Grandfather 80    Social History Social History   Tobacco Use   Smoking status: Never   Smokeless tobacco: Never  Vaping Use   Vaping Use: Never used  Substance Use Topics   Alcohol use: Not Currently   Drug use: Never     Allergies   Dust mite extract   Review of Systems Review of Systems  Constitutional: Negative.   HENT: Negative.    Respiratory: Negative.    Cardiovascular: Negative.   Genitourinary:  Positive for dysuria, flank pain, frequency and urgency. Negative for decreased urine volume, difficulty urinating, enuresis, genital sores, hematuria, penile discharge, penile pain, penile swelling, scrotal swelling and testicular pain.  Skin: Negative.      Physical Exam Triage Vital Signs ED Triage Vitals  Enc Vitals Group     BP 09/24/22 1131 136/74     Pulse Rate 09/24/22 1131 73     Resp 09/24/22 1131 18     Temp 09/24/22 1131 98.9 F (37.2 C)     Temp Source 09/24/22 1131 Oral     SpO2 09/24/22 1131 98 %     Weight 09/24/22 1127 (!) 304 lb 3.8 oz (138 kg)     Height 09/24/22 1127 5' 9"  (1.753 m)     Head Circumference --      Peak Flow --      Pain Score 09/24/22 1126 3     Pain Loc --      Pain Edu? --      Excl. in [place]? --    No data found.  Updated Vital Signs BP 136/74 (BP Location: Left Arm)   Pulse 73   Temp 98.9 F (37.2 C) (Oral)   Resp 18   Ht 5' 9"  (1.753 m)   Wt (!) 304 lb 3.8 oz (138 kg)   SpO2 98%   BMI 44.93 kg/m   Visual Acuity Right  Eye Distance:   Left Eye Distance:   Bilateral Distance:    Right Eye Near:   Left Eye Near:    Bilateral Near:     Physical  Exam Constitutional:      Appearance: Normal appearance.  HENT:     Head: Normocephalic.  Pulmonary:     Effort: Pulmonary effort is normal.  Abdominal:     General: Abdomen is flat. Bowel sounds are normal. There is no distension.     Palpations: Abdomen is soft.     Tenderness: There is no abdominal tenderness. There is no right CVA tenderness, left CVA tenderness or guarding.  Skin:    General: Skin is warm and dry.  Neurological:     Mental Status: He is alert and oriented to person, place, and time. Mental status is at baseline.  Psychiatric:        Mood and Affect: Mood normal.        Behavior: Behavior normal.      UC Treatments / Results  Labs (all labs ordered are listed, but only abnormal results are displayed) Labs Reviewed  URINALYSIS, ROUTINE W REFLEX MICROSCOPIC - Abnormal; Notable for the following components:      Result Value   Glucose, UA 500 (*)    Hgb urine dipstick MODERATE (*)    All other components within normal limits  URINALYSIS, MICROSCOPIC (REFLEX)    EKG   Radiology No results found.  Procedures Procedures (including critical care time)  Medications Ordered in UC Medications - No data to display  Initial Impression / Assessment and Plan / UC Course  I have reviewed the triage vital signs and the nursing notes.  Pertinent labs & imaging results that were available during my care of the patient were reviewed by me and considered in my medical decision making (see chart for details).  Kidney stone  Symptomology and presentation is consistent with nephrolithiasis, discussed with patient, urinalysis is negative for infection, showing moderate hemoglobin, patient currently has medication for pain as well as is taking tamsulosin, declined medication for nausea and vomiting, endorses that he has had kidney stones in the past and this in agreement that this is most likely cause, recommended follow-up with primary doctor for outpatient CT if  symptoms continue to persist or recur, given strict precautions for emergency department follow-up, verbalized understanding Final Clinical Impressions(s) / UC Diagnoses   Final diagnoses:  None   Discharge Instructions   None    ED Prescriptions   None    PDMP not reviewed this encounter.   Hans Eden, NP 09/24/22 1250

## 2022-09-24 NOTE — ED Triage Notes (Addendum)
Pt with right flank pain and n/v. Started two weeks ago. Went away and then came back. Hx kidney stones. Endorses urinary urgency

## 2022-09-24 NOTE — Discharge Instructions (Signed)
Today you are being treated prophlyactically for a kidney stone. In the urgent care setting, I do not have definitive imaging to determine if a kidney stone is present. A x-ray will only show large stones meaning a stone could be present but it will not be seen. This decision was made based on your symptoms and urinalysis.  Please notify your primary doctor today of your symptoms so that they ideally may complete a follow-up visit in the next 1 to 2 weeks and ideally complete outpatient CT imaging to visualize your stones  Your urinalysis showed blood, did not show any signs of infection, blood can occur as the stone touches the urethral lining  Continue tamsulosin. This medication relaxes the urethra and will allow the stone to move through easier   Continue taking oxycodone as needed when pain is flared  At any point if your pain becomes more severe, urine in blood begins or increases, you become lightheaded or dizzy please go to the nearest emergency department for further evaluation

## 2022-09-28 ENCOUNTER — Encounter: Payer: Self-pay | Admitting: Family Medicine

## 2022-09-28 ENCOUNTER — Ambulatory Visit (INDEPENDENT_AMBULATORY_CARE_PROVIDER_SITE_OTHER): Payer: Medicare Other | Admitting: Family Medicine

## 2022-09-28 VITALS — BP 130/72 | HR 78 | Temp 97.1°F | Wt 306.0 lb

## 2022-09-28 DIAGNOSIS — Q6211 Congenital occlusion of ureteropelvic junction: Secondary | ICD-10-CM

## 2022-09-28 DIAGNOSIS — Z23 Encounter for immunization: Secondary | ICD-10-CM | POA: Diagnosis not present

## 2022-09-28 DIAGNOSIS — N200 Calculus of kidney: Secondary | ICD-10-CM

## 2022-09-28 DIAGNOSIS — R109 Unspecified abdominal pain: Secondary | ICD-10-CM

## 2022-09-28 NOTE — Patient Instructions (Addendum)
Thank you for coming to the office today.  We will schedule the CT Scan within 1-2 days to check for kidney stone.  Based on those results, most likely I will refer you to a local Urologist.  [facility] [place],  [place]  [address] Phone: [phone]  They can remove or treat the stone, or if no stone seen, we may need their opinion.   Please schedule a Follow-up Appointment to: Return if symptoms worsen or fail to improve.  If you have any other questions or concerns, please feel free to call the office or send a message through [place]. You may also schedule an earlier appointment if necessary.  Additionally, you may be receiving a survey about your experience at our office within a few days to 1 week by e-mail or mail. We value your feedback.  Nobie Putnam, DO [place]

## 2022-09-28 NOTE — Progress Notes (Signed)
Subjective:    Patient ID: Eric Andrews, male    DOB: November 05, 1951, 71 y.o.   MRN: [id number]  Eric Andrews is a 71 y.o. male presenting on 09/28/2022 for Nephrolithiasis   HPI  Right Flank Pain Suspected Nephrolithiasis  Recent update, [facility] 09/24/22 for same issue Describes severe R sided flank pain, urinary frequency urgency also with dysuria. He even had nausea vomiting at once. He had similar 2 weeks ago and then it disappeared. The pain would resolve after several hours, and if he took Oxycodone pain would subside and then return next 1-2 days.  Urinalysis done at Urgent Care showed RBC blood. No sign of infection. No urine culture collected  - History of Kidney of stones. - He takes Flomax regularly for urinary retention  Now here to follow-up again for same problem. Last 6 days, 3 episodes, 1-2 days in between each episode, seems to wake him up with same pain usually overnight or at 4am. When pain has resolved, he can urinate normally and empty whole bladder, but if in pain it can interfere with his urine.  Today is good day not bothering him but has had recurrent episodes, would like further imaging. Urgent Care recommended outpatient CT renal stone study       08/11/2022    3:21 PM 07/27/2022    1:35 PM 12/17/2021    2:59 PM  Depression screen PHQ 2/9  Decreased Interest 0 0 1  Down, Depressed, Hopeless 1 1 0  PHQ - 2 Score 1 1 1   Altered sleeping 2 3 0  Tired, decreased energy 2 0 1  Change in appetite 3 0 1  Feeling bad or failure about yourself  1 3 1   Trouble concentrating 1 3 0  Moving slowly or fidgety/restless 0 0 0  Suicidal thoughts 0 0 0  PHQ-9 Score 10 10 4   Difficult doing work/chores Not difficult at all Not difficult at all Not difficult at all    Social History   Tobacco Use   Smoking status: Never   Smokeless tobacco: Never  Vaping Use   Vaping Use: Never used  Substance Use Topics   Alcohol use: Not Currently   Drug use: Never     Review of Systems Per HPI unless specifically indicated above     Objective:    BP 130/72 (BP Location: Left Arm, Patient Position: Sitting, Cuff Size: Large)   Pulse 78   Temp (!) 97.1 F (36.2 C) (Temporal)   Wt (!) 306 lb (138.8 kg)   SpO2 98%   BMI 45.19 kg/m   Wt Readings from Last 3 Encounters:  09/28/22 (!) 306 lb (138.8 kg)  09/24/22 (!) 304 lb 3.8 oz (138 kg)  09/16/22 (!) 304 lb 9.6 oz (138.2 kg)    Physical Exam Vitals and nursing note reviewed.  Constitutional:      General: He is not in acute distress.    Appearance: Normal appearance. He is well-developed. He is not diaphoretic.     Comments: Well-appearing, comfortable, cooperative  HENT:     Head: Normocephalic and atraumatic.  Eyes:     General:        Right eye: No discharge.        Left eye: No discharge.     Conjunctiva/sclera: Conjunctivae normal.  Cardiovascular:     Rate and Rhythm: Normal rate.  Pulmonary:     Effort: Pulmonary effort is normal.  Skin:    General: Skin is warm and  dry.     Findings: No erythema or rash.  Neurological:     Mental Status: He is alert and oriented to person, place, and time.  Psychiatric:        Mood and Affect: Mood normal.        Behavior: Behavior normal.        Thought Content: Thought content normal.     Comments: Well groomed, good eye contact, normal speech and thoughts    Results for orders placed or performed during the hospital encounter of 09/24/22  Urinalysis, Routine w reflex microscopic  Result Value Ref Range   Color, Urine YELLOW YELLOW   APPearance CLEAR CLEAR   Specific Gravity, Urine 1.015 1.005 - 1.030   pH 7.0 5.0 - 8.0   Glucose, UA 500 (A) NEGATIVE mg/dL   Hgb urine dipstick MODERATE (A) NEGATIVE   Bilirubin Urine NEGATIVE NEGATIVE   Ketones, ur NEGATIVE NEGATIVE mg/dL   Protein, ur NEGATIVE NEGATIVE mg/dL   Nitrite NEGATIVE NEGATIVE   Leukocytes,Ua NEGATIVE NEGATIVE  Urinalysis, Microscopic (reflex)  Result Value Ref  Range   RBC / HPF 21-50 0 - 5 RBC/hpf   WBC, UA NONE SEEN 0 - 5 WBC/hpf   Bacteria, UA NONE SEEN NONE SEEN   Squamous Epithelial / LPF NONE SEEN 0 - 5      Assessment & Plan:   Problem List Items Addressed This Visit   None Visit Diagnoses     Right nephrolithiasis    -  Primary   Relevant Orders   CT RENAL STONE STUDY   Need for influenza vaccination       Relevant Orders   Flu Vaccine QUAD High Dose(Fluad) (Completed)   Right flank pain       Relevant Orders   CT RENAL STONE STUDY       Suspected acute R Nephrolithiasis in setting of recurrent severe acute R Flank Pain Urinalysis with microscopic RBC large amount recently at urgent care, no sign of UTI infection History of stones before  Pursue STAT imaging CT Renal Stone Study ordered today, to be scheduled, patient notified.  If abnormal with stone found and based on size location or complications would refer to Urology and also if no stone found, but still having significant urological symptoms would still anticipate referral to Urology for further management and assistance.  He has pain medication PRN if need  He is on Flomax  He declines nausea medication  Return criteria reviewed   Flu Shot today   Orders Placed This Encounter  Procedures   CT RENAL STONE STUDY    Standing Status:   Future    Standing Expiration Date:   09/29/2023    Order Specific Question:   Preferred imaging location?    Answer:   [facility]    Order Specific Question:   Release to patient    Answer:   Immediate    Order Specific Question:   Call Results- Best Contact Number?    Answer:   [phone]   Flu Vaccine QUAD High Dose(Fluad)     No orders of the defined types were placed in this encounter.     Follow up plan: Return if symptoms worsen or fail to improve.  Nobie Putnam, [facility] 09/28/2022, 11:10 AM

## 2022-09-29 ENCOUNTER — Ambulatory Visit
Admission: RE | Admit: 2022-09-29 | Discharge: 2022-09-29 | Disposition: A | Discharge status: Home / Self Care | Payer: Medicare Other | Source: Ambulatory Visit | Attending: Family Medicine | Admitting: Family Medicine

## 2022-09-29 DIAGNOSIS — K5730 Diverticulosis of large intestine without perforation or abscess without bleeding: Secondary | ICD-10-CM | POA: Diagnosis not present

## 2022-09-29 DIAGNOSIS — K753 Granulomatous hepatitis, not elsewhere classified: Secondary | ICD-10-CM | POA: Diagnosis not present

## 2022-09-29 DIAGNOSIS — N3289 Other specified disorders of bladder: Secondary | ICD-10-CM | POA: Diagnosis not present

## 2022-09-29 DIAGNOSIS — N200 Calculus of kidney: Secondary | ICD-10-CM | POA: Diagnosis not present

## 2022-09-29 DIAGNOSIS — R109 Unspecified abdominal pain: Secondary | ICD-10-CM | POA: Insufficient documentation

## 2022-09-29 DIAGNOSIS — N132 Hydronephrosis with renal and ureteral calculous obstruction: Secondary | ICD-10-CM | POA: Diagnosis not present

## 2022-09-29 NOTE — Addendum Note (Signed)
Addended by: [name] on: 09/29/2022 06:31 PM   Modules accepted: Orders

## 2022-10-01 ENCOUNTER — Ambulatory Visit: Payer: Medicare Other | Admitting: Urology

## 2022-10-01 ENCOUNTER — Ambulatory Visit
Admission: RE | Admit: 2022-10-01 | Discharge: 2022-10-01 | Disposition: A | Discharge status: Home / Self Care | Payer: Medicare Other | Source: Ambulatory Visit | Attending: Urology | Admitting: Urology

## 2022-10-01 ENCOUNTER — Encounter: Payer: Self-pay | Admitting: Urology

## 2022-10-01 VITALS — BP 130/76 | HR 91 | Ht 70.0 in | Wt 304.0 lb

## 2022-10-01 DIAGNOSIS — N200 Calculus of kidney: Secondary | ICD-10-CM | POA: Insufficient documentation

## 2022-10-01 DIAGNOSIS — N201 Calculus of ureter: Secondary | ICD-10-CM

## 2022-10-01 DIAGNOSIS — R109 Unspecified abdominal pain: Secondary | ICD-10-CM | POA: Diagnosis not present

## 2022-10-01 DIAGNOSIS — N132 Hydronephrosis with renal and ureteral calculous obstruction: Secondary | ICD-10-CM

## 2022-10-01 DIAGNOSIS — N23 Unspecified renal colic: Secondary | ICD-10-CM

## 2022-10-01 DIAGNOSIS — N1330 Unspecified hydronephrosis: Secondary | ICD-10-CM

## 2022-10-01 NOTE — Progress Notes (Signed)
10/01/2022 2:25 PM   Eric Andrews 1951/05/04 [id number]  Referring provider: [name], DO [address] [place],  [place] [address]  Chief Complaint  Patient presents with   Nephrolithiasis    HPI: Eric Andrews is a 71 y.o. male referred for evaluation of a right ureteral calculus.  Seen [facility] 09/24/2022 complaining of right flank pain associated with urinary frequency, urgency, nausea and vomiting. Episode of severe flank pain 2 weeks prior to that visit which lasted 24 hours Urinalysis at that visit with 21-50 RBCs.  He was felt to have a ureteral calculus and it was recommended he follow-up with his PCP who he saw 09/29/2019 Stone protocol CT abdomen pelvis performed 09/29/2022 which showed a 5 mm right UVJ calculus with mild hydronephrosis/hydroureter Prior history of stone disease not requiring intervention On finasteride and tamsulosin for BPH Continues to have intermittent pain which is controlled with oxycodone. No fever or chills   PMH: Past Medical History:  Diagnosis Date   ADD (attention deficit disorder)    Anemia    Anxiety    Arthritis    Depression    Diabetes mellitus without complication (HCC)    Enlarged prostate    GERD (gastroesophageal reflux disease)    History of kidney stones    Hypertension    Knee pain    Neuromuscular disorder (HCC)    lower legs- neuropathy   Sleep apnea     Surgical History: Past Surgical History:  Procedure Laterality Date   APPENDECTOMY     CARPEL TUNEL Bilateral    COLONOSCOPY W/ POLYPECTOMY     HERNIA REPAIR     Left   KNEE ARTHROSCOPY     Left   RIGHT WRIST SX X2     TOTAL KNEE ARTHROPLASTY Left 04/21/2022   Procedure: TOTAL KNEE ARTHROPLASTY;  Surgeon: Hessie Knows, MD;  Location: [facility] ORS;  Service: Orthopedics;  Laterality: Left;   vascetomy      Home Medications:  Allergies as of 10/01/2022       Reactions   Dust Mite Extract         Medication List        Accurate  as of October 01, 2022  2:25 PM. If you have any questions, ask your nurse or doctor.          aspirin EC 81 MG tablet Take 81 mg by mouth daily.   atorvastatin 20 MG tablet Commonly known as: LIPITOR Take 1 tablet (20 mg total) by mouth at bedtime.   busPIRone 7.5 MG tablet Commonly known as: BUSPAR Take 7.5 mg by mouth 2 (two) times daily.   celecoxib 200 MG capsule Commonly known as: CeleBREX Take 1 capsule (200 mg total) by mouth 2 (two) times daily.   cetirizine 10 MG tablet Commonly known as: ZYRTEC TAKE 1 TABLET(10 MG) BY MOUTH AT BEDTIME   chlorthalidone 25 MG tablet Commonly known as: HYGROTON Take 1 tablet (25 mg total) by mouth daily.   cholecalciferol 25 MCG (1000 UNIT) tablet Commonly known as: VITAMIN D3 Take 1,000 Units by mouth in the morning and at bedtime.   docusate sodium 100 MG capsule Commonly known as: COLACE Take 1 capsule (100 mg total) by mouth 2 (two) times daily.   Farxiga 10 MG Tabs tablet Generic drug: dapagliflozin propanediol Take 10 mg by mouth daily before breakfast.   fenofibrate micronized 134 MG capsule Commonly known as: LOFIBRA Take 1 capsule (134 mg total) by mouth daily before  breakfast. TAKE 1 CAPSULE(134 MG) BY MOUTH DAILY BEFORE BREAKFAST   finasteride 5 MG tablet Commonly known as: PROSCAR TAKE 1 TABLET(5 MG) BY MOUTH EVERY EVENING   Fish Oil 1200 MG Caps Take 1,200 mg by mouth in the morning and at bedtime.   gabapentin 600 MG tablet Commonly known as: NEURONTIN Take 600 mg by mouth in the morning, at noon, in the evening, and at bedtime.   hydrocortisone 2.5 % cream Apply to aa's of face and ears QD on Monday, Wednesday, and Friday. What changed:  how much to take how to take this when to take this reasons to take this additional instructions   Insulin Pen Needle 31G X 5 MM Misc Use with insulin to inject into skin 3 times daily as directed   ketoconazole 2 % cream Commonly known as: NIZORAL Apply to  aa's face and ears QD on Tuesday, Thursday, and Saturday. What changed:  how much to take how to take this when to take this reasons to take this additional instructions   losartan 50 MG tablet Commonly known as: COZAAR Take 1 tablet (50 mg total) by mouth daily.   metFORMIN 500 MG 24 hr tablet Commonly known as: GLUCOPHAGE-XR Take 2 tablets (1,000 mg total) by mouth 2 (two) times daily with a meal. Add refills for future   multivitamin capsule Take 1 capsule by mouth daily.   NovoLOG FlexPen 100 UNIT/ML FlexPen Generic drug: insulin aspart Inject 14 Units into the skin 3 (three) times daily with meals. Sliding scale   nystatin 100000 UNIT/ML suspension Commonly known as: MYCOSTATIN Use as directed 5 mLs (500,000 Units total) in the mouth or throat 4 (four) times daily. Swish in mouth and hold for several minutes before swallowing. For 14 days.   omeprazole 20 MG capsule Commonly known as: PRILOSEC TAKE ONE CAPSULE BY MOUTH DAILY BEFORE BREAKFAST   Oxycodone HCl 10 MG Tabs Take 1 tablet (10 mg total) by mouth every 3 (three) hours as needed.   Ozempic (1 MG/DOSE) 4 MG/3ML Sopn Generic drug: Semaglutide (1 MG/DOSE) Inject 1 mg into the skin once a week.   pioglitazone 15 MG tablet Commonly known as: ACTOS Take 1 tablet (15 mg total) by mouth daily.   polyethylene glycol 17 g packet Commonly known as: MIRALAX / GLYCOLAX Take 17 g by mouth daily as needed for mild constipation.   Probiotic 1-250 BILLION-MG Caps Take 1 capsule by mouth daily.   tamsulosin 0.4 MG Caps capsule Commonly known as: FLOMAX TAKE 1 CAPSULE BY MOUTH EVERY NIGHT AT BEDTIME FOR URINE RETENTION   traMADol 50 MG tablet Commonly known as: ULTRAM Take 1 tablet (50 mg total) by mouth every 6 (six) hours as needed. What changed: when to take this   traZODone 100 MG tablet Commonly known as: DESYREL Take 1.5 tablets (150 mg total) by mouth at bedtime.   Tyler Aas FlexTouch 100 UNIT/ML FlexTouch  Pen Generic drug: insulin degludec Inject 68 Units into the skin at bedtime. What changed:  how much to take when to take this   venlafaxine XR 150 MG 24 hr capsule Commonly known as: EFFEXOR-XR Take 1 capsule (150 mg total) by mouth daily with breakfast.   vitamin C 1000 MG tablet Take 1,000 mg by mouth daily.        Allergies:  Allergies  Allergen Reactions   Dust Mite Extract     Family History: Family History  Problem Relation Age of Onset   Heart attack Mother 68  Lung cancer Father    Alzheimer's disease Brother    Heart attack Maternal Grandfather 110    Social History:  reports that he has never smoked. He has never used smokeless tobacco. He reports that he does not currently use alcohol. He reports that he does not use drugs.   Physical Exam: BP 130/76   Pulse 91   Ht 5' 10"  (1.778 m)   Wt (!) 304 lb (137.9 kg)   BMI 43.62 kg/m   Constitutional:  Alert and oriented, No acute distress. HEENT: [place] AT Respiratory: Normal respiratory effort, no increased work of breathing. Psychiatric: Normal mood and affect.   Pertinent Imaging: CT images were personally reviewed and interpreted  CT RENAL STONE STUDY   CT ABDOMEN AND PELVIS WITHOUT CONTRAST  TECHNIQUE: Multidetector CT imaging of the abdomen and pelvis was performed following the standard protocol without IV contrast.  RADIATION DOSE REDUCTION: This exam was performed according to the departmental dose-optimization program which includes automated exposure control, adjustment of the mA and/or kV according to patient size and/or use of iterative reconstruction technique.  COMPARISON:  CT 07/30/2021  FINDINGS: Lower chest: No acute abnormality.  Hepatobiliary: Unchanged calcified right inferior hepatic lobe granulomas. No focal liver abnormality is seen. The gallbladder is unremarkable.  Pancreas: Unremarkable. No pancreatic ductal dilatation or surrounding inflammatory  changes.  Spleen: Normal in size without focal abnormality.  Adrenals/Urinary Tract: Adrenal glands are unremarkable. There is mild right-sided hydroureteronephrosis due to obstructing 5 mm stone at the right UVJ. Mild perinephric and periureteral stranding. The bladder is mildly distended.  Stomach/Bowel: The stomach is within normal limits. There is no evidence of bowel obstruction.Prior appendectomy. Scattered colonic diverticula. No acute diverticulitis.  Vascular/Lymphatic: Scattered atherosclerosis. No AAA. No lymphadenopathy.  Reproductive: Unremarkable.  Other: Prior left inguinal hernia repair.  No ascites.  No free air.  Musculoskeletal: Multilevel degenerative changes of the spine. Physiologic anterior wedging at T12.  IMPRESSION: Obstructing 5 mm stone at the right UVJ, with mild upstream hydroureteronephrosis, perinephric and periureteral stranding.   Electronically Signed By: Maurine Simmering M.D. On: 09/29/2022 10:51   Assessment & Plan:    1.  Right distal ureteral calculus We discussed various treatment options for urolithiasis including observation with or without medical expulsive therapy, shockwave lithotripsy (SWL), ureteroscopy and laser lithotripsy with stent placement. We discussed that management is based on stone size, location, density, patient co-morbidities, and patient preference.  Stones <42m in size have a >80% spontaneous passage rate. Data surrounding the use of tamsulosin for medical expulsive therapy is controversial, but meta analyses suggests it is most efficacious for distal stones between 5-162min size. SWL has a lower stone free rate in a single procedure, but also a lower complication rate compared to ureteroscopy and avoids a stent and associated stent related symptoms. Possible complications include renal hematoma, steinstrasse, and need for additional treatment. Ureteroscopy with laser lithotripsy and stent placement has a higher stone  free rate than SWL in a single procedure, however increased complication rate including possible infection, ureteral injury, bleeding, and stent related morbidity. Common stent related symptoms include dysuria, urgency/frequency, and flank pain After an extensive discussion of the risks and benefits of the above treatment options, the patient would like to proceed with MET. KUB ordered to see if calculus visualized on plain x-ray   ScAbbie SonsMD  [facility] Ocean LaneSuOak Ridgeu[place]opolis[place] [id number]

## 2022-10-02 ENCOUNTER — Ambulatory Visit: Payer: Medicare Other | Admitting: Urology

## 2022-10-02 ENCOUNTER — Encounter: Payer: Self-pay | Admitting: Urology

## 2022-10-05 DIAGNOSIS — M48062 Spinal stenosis, lumbar region with neurogenic claudication: Secondary | ICD-10-CM | POA: Diagnosis not present

## 2022-10-05 DIAGNOSIS — M5126 Other intervertebral disc displacement, lumbar region: Secondary | ICD-10-CM | POA: Diagnosis not present

## 2022-10-05 DIAGNOSIS — M5416 Radiculopathy, lumbar region: Secondary | ICD-10-CM | POA: Diagnosis not present

## 2022-10-05 DIAGNOSIS — M5136 Other intervertebral disc degeneration, lumbar region: Secondary | ICD-10-CM | POA: Diagnosis not present

## 2022-10-07 ENCOUNTER — Telehealth: Payer: Self-pay | Admitting: *Deleted

## 2022-10-07 DIAGNOSIS — N201 Calculus of ureter: Secondary | ICD-10-CM

## 2022-10-07 NOTE — Telephone Encounter (Signed)
-----   Message from [name], MD sent at 10/07/2022  7:41 AM EDT ----- Eric Andrews difficult to visualize on regular x-ray.  If he desires a continued trial of passage recommend a repeat KUB in 1 week.

## 2022-10-07 NOTE — Telephone Encounter (Signed)
Notified patient as instructed, patient pleased. patient agrees

## 2022-10-14 ENCOUNTER — Ambulatory Visit: Payer: Medicare Other | Admitting: Pharmacist

## 2022-10-14 DIAGNOSIS — E1169 Type 2 diabetes mellitus with other specified complication: Secondary | ICD-10-CM

## 2022-10-14 MED ORDER — FARXIGA 10 MG PO TABS: 10.0000 mg | ORAL_TABLET | Freq: Every day | ORAL | 0 refills | Status: DC

## 2022-10-14 NOTE — Chronic Care Management (AMB) (Signed)
10/14/2022 Name: Eric Andrews MRN: [id number] DOB: 1951-04-23  Chief Complaint  Patient presents with   Medication Assistance    Zavon Boeding is a 71 y.o. year old male who presented for a telephone visit.   Patient was referred to the pharmacist by their PCP for assistance in managing medication access.    Subjective:  Today patient reports he was planning to have a follow up x-ray with Urology this week, but had Covid-19 exposure over the weekend and so will reschedule this appointment. Denies any symptoms since exposure  Care Team: Primary Care Provider: [name], DO Urology: [name], MD Neurology: Vickki Hearing, MD; Next Scheduled Visit: 10/19/2022 [facility] Physical Medicine and Rehabilitation; Next Scheduled Visit: 10/26/2022 Pulmonology: Tyler Pita, MD  Medication Access/Adherence  Current Pharmacy:  [facility] DR[facility] STORE [phone] [address], [place] AT [facility] [address] [place] [address] Phone: [phone] Fax: [phone]  [facility] DRUG STORE [place], [place] [address] AT [place] [address] Phone: [phone] Fax: [phone]   Patient reports affordability concerns with their medications: No  - patient currently enrolled in [place] and [employer] assistance programs Patient reports access/transportation concerns to their pharmacy: No  Patient reports adherence concerns with their medications:  No     Diabetes:  Current medications:  Tresiba 60 units daily in evening Novolog 14 units three times daily with meals  Metformin ER 500 mg - 2 tablets (1000 mg) twice daily Pioglitazone 15 mg once daily Farxiga 10 mg daily  Ozempic 1 mg weekly  Current glucose readings: recalls recent morning fasting ranging 140-150  Patient denies hypoglycemic s/sx including dizziness, shakiness, sweating.   Current  medication access support:  - Patient assistance from [employer] for [place], [place] through 12/06/2022 - Patient assistance from [employer] for [place] through 12/06/2022  Reports has a sufficient supply of Ozempic, Novolog, Tresiba and Farxiga to last through the end of current calendar year  Today patient requests assistance with re-enrollment in [employer] patient assistance program for 2024 calendar year  Reports received letter from [employer] stating that he does not qualify for patient assistance for Farxiga in 2024 based on income verification - Based on reported income from patient today, he does not meet criteria for [employer] patient assistance for 2024 calendar year - Patient interested in obtaining Farxiga instead through his Medicare Part D coverage through [place]   Objective: Lab Results  Component Value Date   HGBA1C 6.5 (H) 06/15/2022    Lab Results  Component Value Date   CREATININE 1.12 04/21/2022   BUN 31 (H) 04/10/2022   NA 137 04/10/2022   K 3.7 04/10/2022   CL 102 04/10/2022   CO2 26 04/10/2022    Lab Results  Component Value Date   CHOL 122 07/01/2021   HDL 33 (L) 07/01/2021   LDLCALC 62 07/01/2021   TRIG 209 (H) 07/01/2021   CHOLHDL 3.7 07/01/2021    Medications Reviewed Today     Reviewed by Rennis Petty, RPH-CPP (Pharmacist) on 10/14/22 at 1200  Med List Status: <None>   Medication Order Taking? Sig Documenting Provider Last Dose Status Informant  Ascorbic Acid (VITAMIN C) 1000 MG tablet [id number] Yes Take 1,000 mg by mouth daily. [provider] Taking Active Self  aspirin 81 MG EC tablet [id number] Yes Take  81 mg by mouth daily. [provider] Taking Active Self  atorvastatin (LIPITOR) 20 MG tablet [id number] Yes Take 1 tablet (20 mg total) by mouth at bedtime. Eric Hauser, DO Taking Active   Bacillus Coagulans-Inulin (PROBIOTIC) 1-250 BILLION-MG CAPS [id number] Yes Take 1 capsule by mouth  daily.  Patient taking differently: Take 1 capsule by mouth daily as needed.   Ward, Delice Bison, DO Taking Active Self  busPIRone (BUSPAR) 7.5 MG tablet [id number] Yes Take 7.5 mg by mouth 2 (two) times daily. [provider] Taking Active Self  celecoxib (CELEBREX) 200 MG capsule [id number] Yes Take 1 capsule (200 mg total) by mouth 2 (two) times daily. Eric Hauser, DO Taking Active   cetirizine (ZYRTEC) 10 MG tablet [id number] Yes TAKE 1 TABLET(10 MG) BY MOUTH AT BEDTIME Eric Hauser, DO Taking Active   chlorthalidone (HYGROTON) 25 MG tablet [id number] Yes Take 1 tablet (25 mg total) by mouth daily. Eric Hauser, DO Taking Active   cholecalciferol (VITAMIN D3) 25 MCG (1000 UNIT) tablet [id number] Yes Take 1,000 Units by mouth in the morning and at bedtime. [provider] Taking Active Self  FARXIGA 10 MG TABS tablet [id number] Yes Take 10 mg by mouth daily before breakfast. Eric Hauser, DO Taking Active Self  fenofibrate micronized (LOFIBRA) 134 MG capsule [id number] Yes Take 1 capsule (134 mg total) by mouth daily before breakfast. TAKE 1 CAPSULE(134 MG) BY MOUTH DAILY BEFORE BREAKFAST Karamalegos, [name], DO Taking Active   finasteride (PROSCAR) 5 MG tablet [id number] Yes TAKE 1 TABLET(5 MG) BY MOUTH EVERY EVENING Karamalegos, [name], DO Taking Active   gabapentin (NEURONTIN) 600 MG tablet [id number] Yes Take 600 mg by mouth in the morning, at noon, in the evening, and at bedtime. [provider] Taking Active Self           Med Note Nat Christen   Tue Apr 07, 2022  5:10 PM)    hydrocortisone 2.5 % cream [id number]  Apply to aa's of face and ears QD on Monday, Wednesday, and Friday.  Patient taking differently: Apply 1 application  topically daily as needed (irritation).   Ralene Bathe, MD  Active Self  insulin degludec [facility]) 100 UNIT/ML FlexTouch Pen [id number] Yes Inject 68 Units into the skin at  bedtime.  Patient taking differently: Inject 60 Units into the skin every evening.   Eric Hauser, DO Taking Active Self  Insulin Pen Needle 31G X 5 MM MISC [id number]  Use with insulin to inject into skin 3 times daily as directed Eric Hauser, DO  Active Self  ketoconazole (NIZORAL) 2 % cream [id number]  Apply to aa's face and ears QD on Tuesday, Thursday, and Saturday.  Patient taking differently: Apply 1 application  topically daily as needed for irritation.   Ralene Bathe, MD  Active Self  losartan (COZAAR) 50 MG tablet [id number] Yes Take 1 tablet (50 mg total) by mouth daily. Eric Hauser, DO Taking Active   metFORMIN (GLUCOPHAGE-XR) 500 MG 24 hr tablet [id number] Yes Take 2 tablets (1,000 mg total) by mouth 2 (two) times daily with a meal. Add refills for future Eric Hauser, DO Taking Active   Multiple Vitamin (MULTIVITAMIN) capsule [id number] Yes Take 1 capsule by mouth daily. [provider] Taking Active Self  NOVOLOG FLEXPEN 100 UNIT/ML FlexPen [id number] Yes Inject 14 Units into the skin 3 (three) times daily with meals. Sliding scale Eric Hauser, DO Taking Active Self  Omega-3 Fatty Acids (FISH OIL) 1200 MG CAPS [id number] Yes Take 1,200 mg by mouth in the morning and at bedtime. [provider] Taking Active Self  omeprazole (PRILOSEC) 20 MG capsule [id number] Yes TAKE ONE CAPSULE BY MOUTH DAILY BEFORE BREAKFAST Karamalegos, [name], DO Taking Active   OZEMPIC, 1 MG/DOSE, 4 MG/3ML SOPN [id number] Yes Inject 1 mg into the skin once a week. Eric Hauser, DO Taking Active Self  pioglitazone (ACTOS) 15 MG tablet [id number] Yes Take 1 tablet (15 mg total) by mouth daily. Eric Hauser, DO Taking Active   tamsulosin (FLOMAX) 0.4 MG CAPS capsule [id number] Yes TAKE 1 CAPSULE BY MOUTH EVERY NIGHT AT BEDTIME FOR URINE RETENTION Eric Hauser, DO Taking Active   traMADol (ULTRAM) 50 MG  tablet [id number] Yes Take 1 tablet (50 mg total) by mouth every 6 (six) hours as needed.  Patient taking differently: Take 50 mg by mouth daily.   Duanne Guess, PA-C Taking Active   traZODone (DESYREL) 100 MG tablet [id number] Yes Take 1.5 tablets (150 mg total) by mouth at bedtime.  Patient taking differently: Take 200 mg by mouth at bedtime.   Eric Hauser, DO Taking Active   venlafaxine XR (EFFEXOR-XR) 150 MG 24 hr capsule [id number] Yes Take 1 capsule (150 mg total) by mouth daily with breakfast. Eric Hauser, DO Taking Active               Assessment/Plan:   Comprehensive medication review performed; medication list updated in electronic medical record - Caution patient for increased risk of dizziness, sedation and falls with tramadol, trazodone and gabapentin, particularly when these medications are taken together  Patient reports will use caution, particularly overnight - Patient reports latest buspirone dose of 7.5 mg twice daily as prescribed by Dr. Melrose Nakayama is a reduction from previous dose. Feels like may need to adjust this dose back and also interested in a referral for counseling.  Reports plans to discuss buspirone dose and possible counseling referral with Dr. Melrose Nakayama at upcoming appointment  Diabetes: - Currently controlled - Reviewed goal A1c, goal fasting, and goal 2 hour post prandial glucose - Reviewed dietary modifications including having regular well balanced meals spread throughout the day, while limiting carbohydrate portion sizes - Recommend to check glucose and keep log of results - Meets financial criteria for Ozempic, Novolog and [place] patient assistance program through [employer]. Will collaborate with provider, CPhT, and patient to pursue assistance.  - As patient no longer meets financial criteria for patient assistance for Farxiga. Review patient's formulary for his [employer] plan for 2024 from [facility]  website today. Based on review of formulary and plan information, patient reports the tier 3 copayment for Wilder [place]ade will be affordable for him  CPP will send renewal of Wilder Glade Rx to patient's [place] as requested today    Follow Up Plan: Clinical Pharmacist will follow up with patient by telephone on 12/14/2022 at 11:30 am  Wallace Cullens, PharmD, Para [place], [facility] [phone]

## 2022-10-14 NOTE — Patient Instructions (Signed)
Goals Addressed             This Visit's Progress    Pharmacy Goals       Our goal A1c is less than 7%. This corresponds with fasting sugars less than 130 and 2 hour after meal sugars less than 180. Please check your blood sugar and keep a log of the results  Please check your home blood pressure, keep a log of the results and bring this with you to your medical appointments.  Our goal bad cholesterol, or LDL, is less than 70 . This is why it is important to continue taking your atorvastatin.  Wallace Cullens, PharmD, [place] [phone]

## 2022-10-19 ENCOUNTER — Ambulatory Visit
Admission: RE | Admit: 2022-10-19 | Discharge: 2022-10-19 | Disposition: A | Discharge status: Home / Self Care | Payer: Medicare Other | Attending: Urology | Admitting: Urology

## 2022-10-19 ENCOUNTER — Ambulatory Visit
Admission: RE | Admit: 2022-10-19 | Discharge: 2022-10-19 | Disposition: A | Discharge status: Home / Self Care | Payer: Medicare Other | Source: Ambulatory Visit | Attending: Urology | Admitting: Urology

## 2022-10-19 DIAGNOSIS — Z87442 Personal history of urinary calculi: Secondary | ICD-10-CM | POA: Diagnosis not present

## 2022-10-19 DIAGNOSIS — R200 Anesthesia of skin: Secondary | ICD-10-CM | POA: Diagnosis not present

## 2022-10-19 DIAGNOSIS — N201 Calculus of ureter: Secondary | ICD-10-CM

## 2022-10-19 DIAGNOSIS — G4733 Obstructive sleep apnea (adult) (pediatric): Secondary | ICD-10-CM | POA: Diagnosis not present

## 2022-10-19 DIAGNOSIS — G8929 Other chronic pain: Secondary | ICD-10-CM | POA: Diagnosis not present

## 2022-10-19 DIAGNOSIS — M79602 Pain in left arm: Secondary | ICD-10-CM | POA: Diagnosis not present

## 2022-10-19 DIAGNOSIS — I878 Other specified disorders of veins: Secondary | ICD-10-CM | POA: Diagnosis not present

## 2022-10-19 DIAGNOSIS — R2681 Unsteadiness on feet: Secondary | ICD-10-CM | POA: Diagnosis not present

## 2022-10-19 DIAGNOSIS — M79662 Pain in left lower leg: Secondary | ICD-10-CM | POA: Diagnosis not present

## 2022-10-19 DIAGNOSIS — G479 Sleep disorder, unspecified: Secondary | ICD-10-CM | POA: Diagnosis not present

## 2022-10-19 DIAGNOSIS — M545 Low back pain, unspecified: Secondary | ICD-10-CM | POA: Diagnosis not present

## 2022-10-19 DIAGNOSIS — M542 Cervicalgia: Secondary | ICD-10-CM | POA: Diagnosis not present

## 2022-10-19 DIAGNOSIS — I639 Cerebral infarction, unspecified: Secondary | ICD-10-CM | POA: Diagnosis not present

## 2022-10-19 DIAGNOSIS — M79661 Pain in right lower leg: Secondary | ICD-10-CM | POA: Diagnosis not present

## 2022-10-19 DIAGNOSIS — R2689 Other abnormalities of gait and mobility: Secondary | ICD-10-CM | POA: Diagnosis not present

## 2022-10-20 ENCOUNTER — Other Ambulatory Visit: Payer: Self-pay | Admitting: Urology

## 2022-10-20 DIAGNOSIS — N201 Calculus of ureter: Secondary | ICD-10-CM

## 2022-10-21 ENCOUNTER — Telehealth: Payer: Self-pay | Admitting: Pharmacy Technician

## 2022-10-21 DIAGNOSIS — Z596 Low income: Secondary | ICD-10-CM

## 2022-10-21 NOTE — Progress Notes (Signed)
Eric [facility])                                            [place] Team    10/21/2022  Eric Andrews 16-May-1951 [id number]                                      Medication Assistance Referral-FOR 2024 RE ENROLLMENT  Referral From: [facility] Embedded RPh Dorthula Perfect   Medication/Company: Eric Andrews Patient application portion:  Mailed Provider application portion: Faxed  to Dr. Nobie Putnam Provider address/fax verified via: Office website  Medication/Company: Eric Andrews Patient application portion:  Education officer, museum portion: Faxed  to Dr. Nobie Putnam Provider address/fax verified via: Office website  Medication/Company: Eric Andrews Patient application portion:  Mailed Provider application portion: Faxed  to Dr. Nobie Putnam Provider address/fax verified via: Office website  Eric Andrews Eric Andrews, [place]  [phone]

## 2022-10-22 ENCOUNTER — Telehealth: Payer: Self-pay | Admitting: *Deleted

## 2022-10-22 NOTE — Telephone Encounter (Signed)
Notified patient as instructed, patient pleased °

## 2022-10-22 NOTE — Telephone Encounter (Signed)
-----   Message from [name], MD sent at 10/20/2022  9:26 PM EST ----- Eric Andrews not definitely seen on KUB.  Recommend a follow-up CT to make sure the stone is gone.  Order was entered.

## 2022-10-26 DIAGNOSIS — M48062 Spinal stenosis, lumbar region with neurogenic claudication: Secondary | ICD-10-CM | POA: Diagnosis not present

## 2022-10-26 DIAGNOSIS — E1169 Type 2 diabetes mellitus with other specified complication: Secondary | ICD-10-CM | POA: Diagnosis not present

## 2022-10-28 ENCOUNTER — Ambulatory Visit
Admission: RE | Admit: 2022-10-28 | Discharge: 2022-10-28 | Disposition: A | Discharge status: Home / Self Care | Payer: Medicare Other | Source: Ambulatory Visit | Attending: Urology | Admitting: Urology

## 2022-10-28 DIAGNOSIS — N201 Calculus of ureter: Secondary | ICD-10-CM

## 2022-10-28 DIAGNOSIS — M5136 Other intervertebral disc degeneration, lumbar region: Secondary | ICD-10-CM | POA: Diagnosis not present

## 2022-11-02 ENCOUNTER — Ambulatory Visit: Payer: Medicare Other | Admitting: Pharmacist

## 2022-11-02 ENCOUNTER — Encounter: Payer: Self-pay | Admitting: *Deleted

## 2022-11-02 DIAGNOSIS — Z794 Long term (current) use of insulin: Secondary | ICD-10-CM

## 2022-11-02 NOTE — Patient Instructions (Signed)
Goals Addressed             This Visit's Progress    Pharmacy Goals       Our goal A1c is less than 7%. This corresponds with fasting sugars less than 130 and 2 hour after meal sugars less than 180. Please check your blood sugar and keep a log of the results  Please check your home blood pressure, keep a log of the results and bring this with you to your medical appointments.  Our goal bad cholesterol, or LDL, is less than 70 . This is why it is important to continue taking your atorvastatin.   Wallace Cullens, PharmD, [place] [phone]

## 2022-11-02 NOTE — Chronic Care Management (AMB) (Signed)
11/02/2022 Name: Eric Andrews MRN: [id number] DOB: [birth year]  Chief Complaint  Patient presents with   Medication Assistance   Receive voicemail from patient requesting a call back regarding questions about his medication assistance program eligibility  Eric Andrews is a 71 y.o. year old male who presented for a telephone visit.   They were referred to the pharmacist by their PCP for assistance in managing medication access.    Subjective:  Care Team: Primary Care Provider: [name], DO Urology: [name], MD Neurology: Vickki Hearing, MD; Next Scheduled Visit: 10/19/2022 [facility]; Next Scheduled Visit: 10/26/2022 Pulmonology: Tyler Pita, MD  Medication Access/Adherence  Current Pharmacy:  [facility] [phone] - [address], [address] [place] [address] Phone: [phone] Fax: [phone]  [id number], [address] [place] [address] Phone: [phone] Fax: [phone]   Patient reports affordability concerns with their medications: No  Patient reports access/transportation concerns to their pharmacy: No  Patient reports adherence concerns with their medications:  No     Medication Assistance:    Current medication access support:  - Patient assistance from [employer] for [place], [place] through 12/06/2022 - Patient assistance from [employer] for [place] through 12/06/2022   Reports has a sufficient supply of Ozempic, Novolog, Tresiba and Farxiga to last through the end of current calendar year  Today patient reports that he has reviewed his financial information and found that based on his income, he DOES meet criteria for [employer]Me patient assistance for 2024 calendar year - Note patient previously declined assistance with  [place] program re-enrollment as did not believe he would qualify/ had received letter from [employer] stating that he did not qualify for patient assistance for re-enrollment in 2024 based on income verification     Objective: Lab Results  Component Value Date   HGBA1C 6.5 (H) 06/15/2022    Lab Results  Component Value Date   CREATININE 1.12 04/21/2022   BUN 31 (H) 04/10/2022   NA 137 04/10/2022   K 3.7 04/10/2022   CL 102 04/10/2022   CO2 26 04/10/2022    Medications    Reviewed by Rennis Petty, RPH-CPP (Pharmacist) on 10/14/22 at 1200  Med List Status: <None>   Medication Order Taking? Sig Documenting Provider Last Dose Status Informant  Ascorbic Acid (VITAMIN C) 1000 MG tablet [id number] Yes Take 1,000 mg by mouth daily. [provider] Taking Active Self  aspirin 81 MG EC tablet [id number] Yes Take 81 mg by mouth daily. [provider] Taking Active Self  atorvastatin (LIPITOR) 20 MG tablet [id number] Yes Take 1 tablet (20 mg total) by mouth at bedtime. [name], DO Taking Active   Bacillus Coagulans-Inulin (PROBIOTIC) 1-250 BILLION-MG CAPS [id number] Yes Take 1 capsule by mouth daily.  Patient taking differently: Take 1 capsule by mouth daily as needed.   Ward, Delice Bison, DO Taking Active Self  busPIRone (BUSPAR) 7.5 MG tablet [id number] Yes Take 7.5 mg by mouth 2 (two) times daily. [provider] Taking Active Self  celecoxib (CELEBREX) 200 MG capsule [id number] Yes Take 1 capsule (200 mg total) by mouth 2 (two) times daily. [name], DO Taking Active   cetirizine (ZYRTEC) 10 MG tablet [id number] Yes TAKE 1 TABLET(10 MG) BY  MOUTH AT BEDTIME [name], DO Taking Active   chlorthalidone (HYGROTON) 25 MG tablet [id number] Yes Take 1 tablet (25 mg total) by mouth daily. [name], DO Taking Active   cholecalciferol (VITAMIN D3) 25 MCG (1000 UNIT) tablet [id number] Yes Take 1,000 Units by mouth in  the morning and at bedtime. [provider] Taking Active Self  FARXIGA 10 MG TABS tablet [id number] Yes Take 10 mg by mouth daily before breakfast. [name], DO Taking Active Self  fenofibrate micronized (LOFIBRA) 134 MG capsule [id number] Yes Take 1 capsule (134 mg total) by mouth daily before breakfast. TAKE 1 CAPSULE(134 MG) BY MOUTH DAILY BEFORE BREAKFAST Karamalegos, [name], DO Taking Active   finasteride (PROSCAR) 5 MG tablet [id number] Yes TAKE 1 TABLET(5 MG) BY MOUTH EVERY EVENING Karamalegos, [name], DO Taking Active   gabapentin (NEURONTIN) 600 MG tablet [id number] Yes Take 600 mg by mouth in the morning, at noon, in the evening, and at bedtime. [provider] Taking Active Self           Med Note Nat Christen   Tue Apr 07, 2022  5:10 PM)    hydrocortisone 2.5 % cream [id number]  Apply to aa's of face and ears QD on Monday, Wednesday, and Friday.  Patient taking differently: Apply 1 application  topically daily as needed (irritation).   Ralene Bathe, MD  Active Self  insulin degludec [facility]) 100 UNIT/ML FlexTouch Pen [id number] Yes Inject 68 Units into the skin at bedtime.  Patient taking differently: Inject 60 Units into the skin every evening.   [name], DO Taking Active Self  Insulin Pen Needle 31G X 5 MM MISC [id number]  Use with insulin to inject into skin 3 times daily as directed [name], DO  Active Self  ketoconazole (NIZORAL) 2 % cream [id number]  Apply to aa's face and ears QD on Tuesday, Thursday, and Saturday.  Patient taking differently: Apply 1 application  topically daily as needed for irritation.   Ralene Bathe, MD  Active Self  losartan (COZAAR) 50 MG tablet [id number] Yes Take 1 tablet (50 mg total) by mouth daily. [name], DO Taking Active   metFORMIN (GLUCOPHAGE-XR) 500 MG 24 hr tablet [id number] Yes Take 2 tablets (1,000 mg total) by mouth 2 (two) times  daily with a meal. Add refills for future [name], DO Taking Active   Multiple Vitamin (MULTIVITAMIN) capsule [id number] Yes Take 1 capsule by mouth daily. [provider] Taking Active Self  NOVOLOG FLEXPEN 100 UNIT/ML FlexPen [id number] Yes Inject 14 Units into the skin 3 (three) times daily with meals. Sliding scale [name], DO Taking Active Self  Omega-3 Fatty Acids (FISH OIL) 1200 MG CAPS [id number] Yes Take 1,200 mg by mouth in the morning and at bedtime. [provider] Taking Active Self  omeprazole (PRILOSEC) 20 MG capsule [id number] Yes TAKE ONE CAPSULE BY MOUTH DAILY BEFORE BREAKFAST Karamalegos, [name], DO Taking Active   OZEMPIC, 1 MG/DOSE, 4 MG/3ML SOPN [id number] Yes Inject 1 mg into the skin once a week. [name], DO Taking Active Self  pioglitazone (ACTOS) 15 MG tablet [id number] Yes Take 1 tablet (15 mg total) by mouth daily. [name], DO Taking Active   tamsulosin (FLOMAX) 0.4 MG CAPS capsule [id number] Yes TAKE 1 CAPSULE BY MOUTH EVERY NIGHT AT BEDTIME FOR URINE RETENTION [name], DO Taking Active   traMADol (ULTRAM) 50 MG tablet [id number]  Yes Take 1 tablet (50 mg total) by mouth every 6 (six) hours as needed.  Patient taking differently: Take 50 mg by mouth daily.   Duanne Guess, PA-C Taking Active   traZODone (DESYREL) 100 MG tablet [id number] Yes Take 1.5 tablets (150 mg total) by mouth at bedtime.  Patient taking differently: Take 200 mg by mouth at bedtime.   [name], DO Taking Active   venlafaxine XR (EFFEXOR-XR) 150 MG 24 hr capsule [id number] Yes Take 1 capsule (150 mg total) by mouth daily with breakfast. [name], DO Taking Active               Assessment/Plan:   - As patient reports meets financial criteria for Franne Grip, Novolog and [place] patient assistance program through [employer], will collaborate with provider,  CPhT, and patient to pursue assistance.    Follow Up Plan:  Clinical Pharmacist will follow up with patient by telephone on 12/14/2022 at 11:30 am   Wallace Cullens, PharmD, Para [place], [facility] [place] [phone]

## 2022-11-05 ENCOUNTER — Telehealth: Payer: Self-pay | Admitting: Pharmacy Technician

## 2022-11-05 DIAGNOSIS — Z596 Low income: Secondary | ICD-10-CM

## 2022-11-05 NOTE — Progress Notes (Signed)
[facility] [facility])                                            [place] Team    11/05/2022  Reynard Christoffersen 12/17/1950 [id number]                                      Medication Assistance Referral-For 2024 RE ENROLLMENT  Referral From: [facility] Embedded RPh Dorthula Perfect   Medication/Company: Wilder Glade / AZ[employer]ME Patient application portion:  Mailed Provider application portion: Faxed  to Dr. Nobie Putnam Provider address/fax verified via: Office website   Waynesha Rammel P. Bryor Rami, [place]  [phone]

## 2022-11-18 ENCOUNTER — Telehealth: Payer: Self-pay | Admitting: Pharmacy Technician

## 2022-11-18 DIAGNOSIS — Z596 Low income: Secondary | ICD-10-CM

## 2022-11-18 NOTE — Progress Notes (Signed)
[facility])                                            [place] Team    11/18/2022  Eric Andrews February 19, 1951 [id number]  Received both patient and provider portion(s) of patient assistance application(s) for [place]. Faxed completed application and required documents into AZ[employer]ME.  Also am re mailing the [employer] application for [employer], [facility] as patient informs he did not receive that application.  [name] P. Donelle Hise, [place]  [phone]

## 2022-11-23 ENCOUNTER — Ambulatory Visit (INDEPENDENT_AMBULATORY_CARE_PROVIDER_SITE_OTHER): Payer: Medicare Other | Admitting: Family Medicine

## 2022-11-23 ENCOUNTER — Encounter: Payer: Self-pay | Admitting: Family Medicine

## 2022-11-23 VITALS — BP 138/84 | HR 87 | Temp 98.6°F | Ht 70.0 in | Wt 304.0 lb

## 2022-11-23 DIAGNOSIS — J0110 Acute frontal sinusitis, unspecified: Secondary | ICD-10-CM

## 2022-11-23 DIAGNOSIS — J9801 Acute bronchospasm: Secondary | ICD-10-CM | POA: Diagnosis not present

## 2022-11-23 MED ORDER — AMOXICILLIN-POT CLAVULANATE 875-125 MG PO TABS: 1.0000 | ORAL_TABLET | Freq: Two times a day (BID) | ORAL | 0 refills | Status: DC

## 2022-11-23 MED ORDER — FLUTICASONE PROPIONATE 50 MCG/ACT NA SUSP: 2.0000 | Freq: Every day | NASAL | 3 refills | Status: DC

## 2022-11-23 MED ORDER — ALBUTEROL SULFATE HFA 108 (90 BASE) MCG/ACT IN AERS: 2.0000 | INHALATION_SPRAY | Freq: Four times a day (QID) | RESPIRATORY_TRACT | 0 refills | Status: DC | PRN

## 2022-11-23 NOTE — Patient Instructions (Addendum)
Thank you for coming to the office today.  1. It sounds like you have a Sinusitis (Bacterial Infection) - this most likely started as an Upper Respiratory Virus that has settled into an infection. Allergies can also cause this. - Start Augmentin 1 pill twice daily (breakfast and dinner, with food and plenty of water) for 10 days, complete entire course, do not stop early even if feeling better - Wean down or taper off of Oxymetazoline - START instead the other nose spray steroid as prescribed at night is fine -  Flonase 2 sprays in each nostril daily for next 4-6 weeks, then you may stop and use seasonally or as needed - Recommend may also try using Nasal Saline spray multiple times a day to help flush out congestion and clear sinuses - Improve hydration by drinking plenty of clear fluids (water, gatorade) to reduce secretions and thin congestion - Congestion draining down throat can cause irritation. May try warm herbal tea with honey, cough drops - Can take Tylenol or Ibuprofen as needed for fevers - May continue over the counter cold medicine as you are, I would not use any decongestant or mucinex longer than 7 days.  ----------------------------  [place] Address: [address], [place], [place] [address] Hours: Open 8AM-5PM Phone: [phone]  [facility] [place], [place] [address] Hours - M-F 8-5 Phone: [phone] phone  [facility] [address] [place], [place] [address] Hours: Ludwig Lean / W 8-12noon Phone: [phone]   Please schedule a Follow-up Appointment to: Return if symptoms worsen or fail to improve.  If you have any other questions or concerns, please feel free to call the office or send a message through [place]. You may also schedule an earlier appointment if necessary.  Additionally, you may be receiving a survey about your experience at our office within a few days to 1 week by  e-mail or mail. We value your feedback.  Nobie Putnam, DO [place]

## 2022-11-23 NOTE — Progress Notes (Signed)
Subjective:    Patient ID: Eric Andrews, male    DOB: September 05, 1951, 71 y.o.   MRN: [id number]  Eric Andrews is a 71 y.o. male presenting on 11/23/2022 for Cough and Nasal Congestion   HPI  Sinusitis Reports 1 month ago with bad cold symptoms, for few days had difficulty with coughing spells, he then had some persistent sinus congestion drainage. - He admits now persistent sinus drainage congestion, cough is worse during the day and at night can keep him awake - Oxymetazoline nightly - using OTC cough medicine  Upcoming trip to [place] he asks about COVID vaccine      11/23/2022   11:45 AM 08/11/2022    3:21 PM 07/27/2022    1:35 PM  Depression screen PHQ 2/9  Decreased Interest 2 0 0  Down, Depressed, Hopeless 1 1 1   PHQ - 2 Score 3 1 1   Altered sleeping 1 2 3   Tired, decreased energy 1 2 0  Change in appetite 3 3 0  Feeling bad or failure about yourself  1 1 3   Trouble concentrating 3 1 3   Moving slowly or fidgety/restless 1 0 0  Suicidal thoughts 0 0 0  PHQ-9 Score 13 10 10   Difficult doing work/chores Somewhat difficult Not difficult at all Not difficult at all    Social History   Tobacco Use   Smoking status: Never   Smokeless tobacco: Never  Vaping Use   Vaping Use: Never used  Substance Use Topics   Alcohol use: Not Currently   Drug use: Never    Review of Systems Per HPI unless specifically indicated above     Objective:    BP 138/84   Pulse 87   Temp 98.6 F (37 C) (Oral)   Ht 5' 10"  (1.778 m)   Wt (!) 304 lb (137.9 kg)   SpO2 97%   BMI 43.62 kg/m   Wt Readings from Last 3 Encounters:  11/23/22 (!) 304 lb (137.9 kg)  10/01/22 (!) 304 lb (137.9 kg)  09/28/22 (!) 306 lb (138.8 kg)    Physical Exam Vitals and nursing note reviewed.  Constitutional:      General: He is not in acute distress.    Appearance: He is well-developed. He is obese. He is not diaphoretic.     Comments: Well-appearing, comfortable, cooperative  HENT:     Head:  Normocephalic and atraumatic.  Eyes:     General:        Right eye: No discharge.        Left eye: No discharge.     Conjunctiva/sclera: Conjunctivae normal.  Neck:     Thyroid: No thyromegaly.  Cardiovascular:     Rate and Rhythm: Normal rate and regular rhythm.     Pulses: Normal pulses.     Heart sounds: Normal heart sounds. No murmur heard. Pulmonary:     Effort: Pulmonary effort is normal. No respiratory distress.     Breath sounds: No wheezing or rales.     Comments: Mild coarse bronchospasm with cough only Musculoskeletal:        General: Normal range of motion.     Cervical back: Normal range of motion and neck supple.  Lymphadenopathy:     Cervical: No cervical adenopathy.  Skin:    General: Skin is warm and dry.     Findings: No erythema or rash.  Neurological:     Mental Status: He is alert and oriented to person, place, and time. Mental status  is at baseline.  Psychiatric:        Behavior: Behavior normal.     Comments: Well groomed, good eye contact, normal speech and thoughts    Results for orders placed or performed during the hospital encounter of 09/24/22  Urinalysis, Routine w reflex microscopic  Result Value Ref Range   Color, Urine YELLOW YELLOW   APPearance CLEAR CLEAR   Specific Gravity, Urine 1.015 1.005 - 1.030   pH 7.0 5.0 - 8.0   Glucose, UA 500 (A) NEGATIVE mg/dL   Hgb urine dipstick MODERATE (A) NEGATIVE   Bilirubin Urine NEGATIVE NEGATIVE   Ketones, ur NEGATIVE NEGATIVE mg/dL   Protein, ur NEGATIVE NEGATIVE mg/dL   Nitrite NEGATIVE NEGATIVE   Leukocytes,Ua NEGATIVE NEGATIVE  Urinalysis, Microscopic (reflex)  Result Value Ref Range   RBC / HPF 21-50 0 - 5 RBC/hpf   WBC, UA NONE SEEN 0 - 5 WBC/hpf   Bacteria, UA NONE SEEN NONE SEEN   Squamous Epithelial / LPF NONE SEEN 0 - 5      Assessment & Plan:   Problem List Items Addressed This Visit   None Visit Diagnoses     Acute non-recurrent frontal sinusitis    -  Primary   Relevant  Medications   amoxicillin-clavulanate (AUGMENTIN) 875-125 MG tablet   fluticasone (FLONASE) 50 MCG/ACT nasal spray   Bronchospasm       Relevant Medications   albuterol (VENTOLIN HFA) 108 (90 Base) MCG/ACT inhaler       Consistent with acute frontal sinusitis, likely initially viral URI vs allergic rhinitis component with worsening concern for bacterial infection.   Plan: 1. Start Augmentin 875-186m PO BID x 10 days 2. Start nasal steroid Flonase 2 sprays in each nostril daily for 4-6 weeks, may repeat course seasonally or as needed 3. Albuterol AS NEEDED R. Return criteria reviewed  No orders of the defined types were placed in this encounter.    Meds ordered this encounter  Medications   amoxicillin-clavulanate (AUGMENTIN) 875-125 MG tablet    Sig: Take 1 tablet by mouth 2 (two) times daily.    Dispense:  20 tablet    Refill:  0   albuterol (VENTOLIN HFA) 108 (90 Base) MCG/ACT inhaler    Sig: Inhale 2 puffs into the lungs every 6 (six) hours as needed for wheezing or shortness of breath.    Dispense:  8 g    Refill:  0   fluticasone (FLONASE) 50 MCG/ACT nasal spray    Sig: Place 2 sprays into both nostrils daily. Use for 4-6 weeks then stop and use seasonally or as needed.    Dispense:  16 g    Refill:  3      Follow up plan: Return if symptoms worsen or fail to improve.   ANobie Putnam [facility] 11/23/2022, 11:48 AM

## 2022-11-27 ENCOUNTER — Telehealth: Payer: Self-pay | Admitting: Pharmacy Technician

## 2022-11-27 DIAGNOSIS — Z596 Low income: Secondary | ICD-10-CM

## 2022-11-27 NOTE — Progress Notes (Signed)
[facility])                                            [place] Team    11/27/2022  Eric Andrews Mar 13, 1951 [id number]  Received both patient and provider portion(s) of patient assistance application(s) for Ozempic, Novolog and [place]. Faxed completed application and required documents into [employer].    Sharlynn Seckinger P. [name], [name]  [phone]

## 2022-12-09 ENCOUNTER — Other Ambulatory Visit: Payer: Self-pay | Admitting: Orthopedic Surgery

## 2022-12-09 ENCOUNTER — Other Ambulatory Visit: Payer: Self-pay | Admitting: Family Medicine

## 2022-12-09 DIAGNOSIS — M25512 Pain in left shoulder: Secondary | ICD-10-CM | POA: Diagnosis not present

## 2022-12-09 DIAGNOSIS — E1169 Type 2 diabetes mellitus with other specified complication: Secondary | ICD-10-CM

## 2022-12-09 DIAGNOSIS — M19012 Primary osteoarthritis, left shoulder: Secondary | ICD-10-CM | POA: Diagnosis not present

## 2022-12-09 NOTE — Telephone Encounter (Signed)
Requested Prescriptions  Pending Prescriptions Disp Refills   pioglitazone (ACTOS) 15 MG tablet [Pharmacy Med Name: PIOGLITAZONE '15MG'$  TABLETS] 90 tablet 0    Sig: Take 1 tablet (15 mg total) by mouth daily. OFFICE VISIT NEEDED FOR ADDITIONAL REFILLS, CALL OFFICE TO SCHEDULE     Endocrinology:  Diabetes - Glitazones - pioglitazone Passed - 12/09/2022  6:23 AM      Passed - HBA1C is between 0 and 7.9 and within 180 days    Hgb A1c MFr Bld  Date Value Ref Range Status  06/15/2022 6.5 (H) <5.7 % of total Hgb Final    Comment:    For someone without known diabetes, a hemoglobin A1c value of 6.5% or greater indicates that they may have  diabetes and this should be confirmed with a follow-up  test. . For someone with known diabetes, a value <7% indicates  that their diabetes is well controlled and a value  greater than or equal to 7% indicates suboptimal  control. A1c targets should be individualized based on  duration of diabetes, age, comorbid conditions, and  other considerations. . Currently, no consensus exists regarding use of hemoglobin A1c for diagnosis of diabetes for children. Renella Cunas - Valid encounter within last 6 months    Recent Outpatient Visits           2 weeks ago Acute non-recurrent frontal sinusitis   [facility] [place], [name], DO   1 month ago Type 2 diabetes mellitus with other specified complication, with long-term current use of insulin ([place])   [place], Grayland Ormond A, RPH-CPP   1 month ago Type 2 diabetes mellitus with other specified complication, with long-term current use of insulin ([place])   [place], Grayland Ormond A, RPH-CPP   2 months ago Right nephrolithiasis   [place], DO   4 months ago Oral thrush   [place], DO       Future Appointments             In 3 months Nehemiah Massed  Monia Sabal, MD [place]   In 10 months [place], Ronda Fairly, [place]

## 2022-12-10 ENCOUNTER — Ambulatory Visit
Admission: RE | Admit: 2022-12-10 | Discharge: 2022-12-10 | Disposition: A | Discharge status: Home / Self Care | Payer: Medicare Other | Source: Ambulatory Visit | Attending: Orthopedic Surgery | Admitting: Orthopedic Surgery

## 2022-12-10 DIAGNOSIS — M19012 Primary osteoarthritis, left shoulder: Secondary | ICD-10-CM | POA: Diagnosis not present

## 2022-12-10 DIAGNOSIS — M25412 Effusion, left shoulder: Secondary | ICD-10-CM | POA: Diagnosis not present

## 2022-12-10 DIAGNOSIS — M24012 Loose body in left shoulder: Secondary | ICD-10-CM | POA: Diagnosis not present

## 2022-12-10 DIAGNOSIS — M25512 Pain in left shoulder: Secondary | ICD-10-CM | POA: Insufficient documentation

## 2022-12-14 ENCOUNTER — Ambulatory Visit: Payer: Medicare Other | Admitting: Pharmacist

## 2022-12-14 DIAGNOSIS — E1169 Type 2 diabetes mellitus with other specified complication: Secondary | ICD-10-CM

## 2022-12-14 NOTE — Progress Notes (Signed)
12/14/2022 Name: Eric Andrews MRN: [id number] DOB: [birth year]  Chief Complaint  Patient presents with   Medication Assistance    Eric Andrews is a 72 y.o. year old male who presented for a telephone visit.   They were referred to the pharmacist by their PCP for assistance in managing medication access.    Subjective:  Care Team: Primary Care Provider: [name], DO Urology: [name], MD Neurology: Vickki Hearing, MD; Next Scheduled Visit: 01/19/2023 [facility] Medicine and Rehabilitation Pulmonology: Tyler Pita, MD  Medication Access/Adherence  Patient reports he is scheduled to have left shoulder surgery on 01/05/2023  Current Pharmacy:  [facility] [place], [place] AT [address] [place] [address] Phone: [phone] Fax: [place], [place] [address] [place] [address] AT [place] [address] Phone: [phone] Fax: [phone]   Patient reports affordability concerns with their medications: No  Patient reports access/transportation concerns to their pharmacy: No  Patient reports adherence concerns with their medications:  No     Diabetes:   Current medications:  Tresiba 60 units daily in evening Novolog 14 units three times daily with meals  Metformin ER 500 mg - 2 tablets (1000 mg) twice daily Pioglitazone 15 mg once daily Farxiga 10 mg daily  Ozempic 1 mg weekly   Current glucose readings: recalls recent morning fasting ranging 100-130   Patient denies hypoglycemic s/sx including dizziness, shakiness, sweating.    Current medication access support: Collaborating with [place] for aid with re-enrollment applications from [employer] for [place], [place] and from [place] for [place] - Today reports he received a call from [employer] advising he was approved for  re-enrollment for [place] assistance through 12/07/2023 and received a shipment of Farxiga on 12/12/2022 - Reports has a sufficient supply of Novolog and Tresiba for now, but only has 2 more doses of Ozempic remaining     Health Maintenance  Health Maintenance Due  Topic Date Due   Diabetic kidney evaluation - Urine ACR  Never done   Hepatitis C Screening  Never done   Zoster Vaccines- Shingrix (1 of 2) Never done   COVID-19 Vaccine (3 - Moderna risk series) 05/28/2021   FOOT EXAM  07/08/2022     Objective: Lab Results  Component Value Date   HGBA1C 6.5 (H) 06/15/2022    Lab Results  Component Value Date   CREATININE 1.12 04/21/2022   BUN 31 (H) 04/10/2022   NA 137 04/10/2022   K 3.7 04/10/2022   CL 102 04/10/2022   CO2 26 04/10/2022    Lab Results  Component Value Date   CHOL 122 07/01/2021   HDL 33 (L) 07/01/2021   LDLCALC 62 07/01/2021   TRIG 209 (H) 07/01/2021   CHOLHDL 3.7 07/01/2021    Medications Reviewed Today     Reviewed by Rennis Petty, RPH-CPP (Pharmacist) on 12/14/22 at 77  Med List Status: <None>   Medication Order Taking? Sig Documenting Provider Last Dose Status Informant  albuterol (VENTOLIN HFA) 108 (90 Base) MCG/ACT inhaler [id number] No Inhale 2 puffs into the lungs every 6 (six) hours as needed for wheezing or shortness of breath.  Patient not taking: Reported on 12/14/2022   [name], DO Not Taking Active   Ascorbic Acid (VITAMIN C) 1000 MG  tablet [id number]  Take 1,000 mg by mouth daily. [provider]  Active Self  aspirin 81 MG EC tablet [id number]  Take 81 mg by mouth daily. [provider]  Active Self  atorvastatin (LIPITOR) 20 MG tablet [id number]  Take 1 tablet (20 mg total) by mouth at bedtime. Karamalegos, [name], DO  Active   Bacillus Coagulans-Inulin (PROBIOTIC) 1-250 BILLION-MG CAPS [id number]  Take 1 capsule by mouth daily.  Patient taking differently: Take 1 capsule by mouth daily as  needed.   Ward, Delice Bison, DO  Active Self  busPIRone (BUSPAR) 7.5 MG tablet [id number]  Take 7.5 mg by mouth 2 (two) times daily. [provider]  Active Self  celecoxib (CELEBREX) 200 MG capsule [id number]  Take 1 capsule (200 mg total) by mouth 2 (two) times daily. [name], DO  Active   cetirizine (ZYRTEC) 10 MG tablet [id number]  TAKE 1 TABLET(10 MG) BY MOUTH AT BEDTIME Karamalegos, [name], DO  Active   chlorthalidone (HYGROTON) 25 MG tablet [id number]  Take 1 tablet (25 mg total) by mouth daily. Karamalegos, [name], DO  Active   cholecalciferol (VITAMIN D3) 25 MCG (1000 UNIT) tablet [id number]  Take 1,000 Units by mouth in the morning and at bedtime. [provider]  Active Self  FARXIGA 10 MG TABS tablet [id number] Yes Take 1 tablet (10 mg total) by mouth daily before breakfast. [name], DO Taking Active   fenofibrate micronized (LOFIBRA) 134 MG capsule [id number]  Take 1 capsule (134 mg total) by mouth daily before breakfast. TAKE 1 CAPSULE(134 MG) BY MOUTH DAILY BEFORE BREAKFAST [name], DO  Active   finasteride (PROSCAR) 5 MG tablet [id number]  TAKE 1 TABLET(5 MG) BY MOUTH EVERY EVENING Karamalegos, [name], DO  Active   fluticasone (FLONASE) 50 MCG/ACT nasal spray [id number]  Place 2 sprays into both nostrils daily. Use for 4-6 weeks then stop and use seasonally or as needed. Karamalegos, [name], DO  Active   gabapentin (NEURONTIN) 600 MG tablet [id number]  Take 600 mg by mouth in the morning, at noon, in the evening, and at bedtime. [provider]  Expired 10/21/22 2359 Self           Med Note Kellie Simmering, Jenene Slicker   Tue Apr 07, 2022  5:10 PM)    hydrocortisone 2.5 % cream [id number]  Apply to aa's of face and ears QD on Monday, Wednesday, and Friday.  Patient taking differently: Apply 1 application  topically daily as needed (irritation).   Ralene Bathe, MD  Active Self  insulin degludec [facility]) 100 UNIT/ML FlexTouch Pen [id number] Yes Inject 68 Units into the skin at bedtime.  Patient taking differently: Inject 60 Units into the skin every evening.   [name], DO Taking Active Self  Insulin Pen Needle 31G X 5 MM MISC [id number]  Use with insulin to inject into skin 3 times daily as directed [name], DO  Active Self  ketoconazole (NIZORAL) 2 % cream [id number]  Apply to aa's face and ears QD on Tuesday, Thursday, and Saturday.  Patient taking differently: Apply 1 application  topically daily as needed for irritation.   Ralene Bathe, MD  Active Self  losartan (COZAAR) 50 MG tablet [id number]  Take 1 tablet (50 mg total) by mouth daily. Karamalegos, [name], DO  Active   metFORMIN (GLUCOPHAGE-XR) 500 MG 24 hr tablet [id number] Yes Take 2 tablets (1,000 mg total) by mouth 2 (two)  times daily with a meal. Add refills for future [name], DO Taking Active   Multiple Vitamin (MULTIVITAMIN) capsule [id number]  Take 1 capsule by mouth daily. [provider]  Active Self  NOVOLOG FLEXPEN 100 UNIT/ML FlexPen [id number] Yes Inject 14 Units into the skin 3 (three) times daily with meals. Sliding scale [name], DO Taking Active Self  Omega-3 Fatty Acids (FISH OIL) 1200 MG CAPS [id number]  Take 1,200 mg by mouth in the morning and at bedtime. [provider]  Active Self  omeprazole (PRILOSEC) 20 MG capsule [id number]  TAKE ONE CAPSULE BY MOUTH DAILY BEFORE BREAKFAST Karamalegos, [name], DO  Active   OZEMPIC, 1 MG/DOSE, 4 MG/3ML SOPN [id number] Yes Inject 1 mg into the skin once a week. [name], DO Taking Active Self  pioglitazone (ACTOS) 15 MG tablet [id number] Yes Take 1 tablet (15 mg total) by mouth daily. OFFICE VISIT NEEDED FOR ADDITIONAL REFILLS, CALL OFFICE TO SCHEDULE Karamalegos, [name], DO Taking Active   tamsulosin (FLOMAX) 0.4 MG CAPS capsule [id number]  TAKE 1 CAPSULE BY MOUTH  EVERY NIGHT AT BEDTIME FOR URINE RETENTION [name], DO  Active   traMADol (ULTRAM) 50 MG tablet [id number]  Take 1 tablet (50 mg total) by mouth every 6 (six) hours as needed.  Patient taking differently: Take 50 mg by mouth daily.   Duanne Guess, PA-C  Active   traZODone (DESYREL) 100 MG tablet [id number]  Take 1.5 tablets (150 mg total) by mouth at bedtime.  Patient taking differently: Take 200 mg by mouth at bedtime.   [name], DO  Active   venlafaxine XR (EFFEXOR-XR) 150 MG 24 hr capsule [id number]  Take 1 capsule (150 mg total) by mouth daily with breakfast. [name], DO  Active               Assessment/Plan:   Advise patient to contact PCP office to schedule due follow up appointment  Diabetes: - Currently controlled - Recommend to check glucose and keep log of results, have this log to review at upcoming appointments, but to contact office sooner if needed for readings outside of established parameters - Advise patient to [facility] [phone]) today to find out when next refill of Ozempic is scheduled to be shipped to him and, if needed, to request expedited shipment for medication to arrive before he will run out of current supply - Patient to call office or clinical pharmacist if needs to request Ozempic Rx be sent into local pharmacy while waiting on shipment from assistance program  Follow Up Plan: Clinical Pharmacist will follow up with patient by telephone on 01/04/2023 at 11:30 am  Wallace Cullens, PharmD, [place], [place] [phone]

## 2022-12-14 NOTE — Patient Instructions (Signed)
Goals Addressed             This Visit's Progress    Pharmacy Goals       Our goal A1c is less than 7%. This corresponds with fasting sugars less than 130 and 2 hour after meal sugars less than 180. Please check your blood sugar and keep a log of the results  Please check your home blood pressure, keep a log of the results and bring this with you to your medical appointments.  Our goal bad cholesterol, or LDL, is less than 70 . This is why it is important to continue taking your atorvastatin.  Eric Andrews, PharmD, BCACP Clinical Pharmacist [facility] [phone]        

## 2022-12-15 ENCOUNTER — Telehealth: Payer: Self-pay | Admitting: Pharmacy Technician

## 2022-12-15 DIAGNOSIS — Z596 Low income: Secondary | ICD-10-CM

## 2022-12-15 NOTE — Progress Notes (Signed)
[facility])                                            [place] Team    12/15/2022  Eric Andrews [birth year] [id number]  Care coordination call placed to [employer] in regard to [facility] application.  Spoke to [place] who informs patient is APPROVED 12/07/22-12/07/23. Medication will automatically fill and ship to patient's home address on file based on last fill date in 2023.  Eric Andrews P. [name], [place]  [phone]

## 2022-12-18 ENCOUNTER — Telehealth: Payer: Self-pay | Admitting: Pharmacy Technician

## 2022-12-18 ENCOUNTER — Other Ambulatory Visit: Payer: Self-pay | Admitting: Orthopedic Surgery

## 2022-12-18 DIAGNOSIS — Z596 Low income: Secondary | ICD-10-CM

## 2022-12-18 NOTE — Progress Notes (Signed)
[facility])                                            [place] Team    12/18/2022  Dawson Albers 03/13/1951 [id number]  Care coordination call placed to [employer] in regard to [place] and Ozempic application.  Spoke to [place] who informs patient is APPROVED 12/15/22-12/07/23. Medications will auto fill and ship based on last fill dates in 2023.  Letonia Stead P. Lealand Elting, [place]  [phone]

## 2022-12-22 ENCOUNTER — Encounter
Admission: RE | Admit: 2022-12-22 | Discharge: 2022-12-22 | Disposition: A | Discharge status: Home / Self Care | Payer: Medicare Other | Source: Ambulatory Visit | Attending: Orthopedic Surgery | Admitting: Orthopedic Surgery

## 2022-12-22 VITALS — BP 120/77 | HR 82 | Temp 98.3°F | Resp 18 | Ht 70.0 in | Wt 305.1 lb

## 2022-12-22 DIAGNOSIS — Z01818 Encounter for other preprocedural examination: Secondary | ICD-10-CM | POA: Diagnosis not present

## 2022-12-22 DIAGNOSIS — Z01810 Encounter for preprocedural cardiovascular examination: Secondary | ICD-10-CM | POA: Diagnosis not present

## 2022-12-22 DIAGNOSIS — Z01812 Encounter for preprocedural laboratory examination: Secondary | ICD-10-CM

## 2022-12-22 DIAGNOSIS — E1169 Type 2 diabetes mellitus with other specified complication: Secondary | ICD-10-CM | POA: Insufficient documentation

## 2022-12-22 DIAGNOSIS — I4510 Unspecified right bundle-branch block: Secondary | ICD-10-CM | POA: Insufficient documentation

## 2022-12-22 DIAGNOSIS — Z794 Long term (current) use of insulin: Secondary | ICD-10-CM | POA: Insufficient documentation

## 2022-12-22 LAB — CBC WITH DIFFERENTIAL/PLATELET
Abs Immature Granulocytes: 0.02 10*3/uL (ref 0.00–0.07)
Basophils Absolute: 0.1 10*3/uL (ref 0.0–0.1)
Basophils Relative: 1 %
Eosinophils Absolute: 0.2 10*3/uL (ref 0.0–0.5)
Eosinophils Relative: 4 %
HCT: 47.6 % (ref 39.0–52.0)
Hemoglobin: 15.5 g/dL (ref 13.0–17.0)
Immature Granulocytes: 0 %
Lymphocytes Relative: 33 %
Lymphs Abs: 2.1 10*3/uL (ref 0.7–4.0)
MCH: 27.4 pg (ref 26.0–34.0)
MCHC: 32.6 g/dL (ref 30.0–36.0)
MCV: 84.1 fL (ref 80.0–100.0)
Monocytes Absolute: 0.5 10*3/uL (ref 0.1–1.0)
Monocytes Relative: 9 %
Neutro Abs: 3.4 10*3/uL (ref 1.7–7.7)
Neutrophils Relative %: 53 %
Platelets: 168 10*3/uL (ref 150–400)
RBC: 5.66 MIL/uL (ref 4.22–5.81)
RDW: 14 % (ref 11.5–15.5)
WBC: 6.4 10*3/uL (ref 4.0–10.5)
nRBC: 0 % (ref 0.0–0.2)

## 2022-12-22 LAB — URINALYSIS, ROUTINE W REFLEX MICROSCOPIC
Bacteria, UA: NONE SEEN
Bilirubin Urine: NEGATIVE
Glucose, UA: >=500 mg/dL — AB
Hgb urine dipstick: NEGATIVE
Ketones, ur: NEGATIVE mg/dL
Leukocytes,Ua: NEGATIVE
Nitrite: NEGATIVE
Protein, ur: NEGATIVE mg/dL
Specific Gravity, Urine: 1.025 (ref 1.005–1.030)
Squamous Epithelial / HPF: NONE SEEN /HPF (ref 0–5)
pH: 6 (ref 5.0–8.0)

## 2022-12-22 LAB — SURGICAL PCR SCREEN
MRSA, PCR: NEGATIVE
Staphylococcus aureus: NEGATIVE

## 2022-12-22 LAB — COMPREHENSIVE METABOLIC PANEL
ALT: 48 U/L — ABNORMAL HIGH (ref 0–44)
AST: 33 U/L (ref 15–41)
Albumin: 4.3 g/dL (ref 3.5–5.0)
Alkaline Phosphatase: 31 U/L — ABNORMAL LOW (ref 38–126)
Anion gap: 7 (ref 5–15)
BUN: 27 mg/dL — ABNORMAL HIGH (ref 8–23)
CO2: 30 mmol/L (ref 22–32)
Calcium: 9.7 mg/dL (ref 8.9–10.3)
Chloride: 100 mmol/L (ref 98–111)
Creatinine, Ser: 1 mg/dL (ref 0.61–1.24)
GFR, Estimated: >60 mL/min (ref >60)
Glucose, Bld: 119 mg/dL — ABNORMAL HIGH (ref 70–99)
Potassium: 3.7 mmol/L (ref 3.5–5.1)
Sodium: 137 mmol/L (ref 135–145)
Total Bilirubin: 0.7 mg/dL (ref 0.3–1.2)
Total Protein: 7.3 g/dL (ref 6.5–8.1)

## 2022-12-22 LAB — TYPE AND SCREEN
ABO/RH(D): A POS
Antibody Screen: NEGATIVE

## 2022-12-22 LAB — HEMOGLOBIN A1C
Hgb A1c MFr Bld: 6.8 % — ABNORMAL HIGH (ref 4.8–5.6)
Mean Plasma Glucose: 148.46 mg/dL

## 2022-12-22 NOTE — Patient Instructions (Addendum)
Your procedure is scheduled on: Tuesday January 05, 2023. Report to Day Surgery inside [facility] 2nd floor, stop by registration desk before getting on elevator.  To find out your arrival time please call [phone] between 1PM - 3PM on Monday January 04, 2023.  Remember: Instructions that are not followed completely may result in serious medical risk,  up to and including death, or upon the discretion of your surgeon and anesthesiologist your  surgery may need to be rescheduled.     _X__ 1. Do not eat food after midnight the night before your procedure.                 No chewing gum or hard candies. You may drink clear liquids up to 2 hours                 before you are scheduled to arrive for your surgery- DO not drink clear                 liquids within 2 hours of the start of your surgery.                 Clear Liquids include:  water,   __X__2.   Complete the "Ensure Clear Pre-surgery Clear Carbohydrate Drink" provided to you, 2 hours before arrival. **If you are diabetic you will be provided with an alternative drink, Gatorade Zero or G2.  __X__3.  On the morning of surgery brush your teeth with toothpaste and water, you                may rinse your mouth with mouthwash if you wish.  Do not swallow any toothpaste of mouthwash.     _X__ 4.  No Alcohol for 24 hours before or after surgery.   _X__ 5.  Do Not Smoke or use e-cigarettes For 24 Hours Prior to Your Surgery.                 Do not use any chewable tobacco products for at least 6 hours prior to                 Surgery.  _X__  6.  Do not use any recreational drugs (marijuana, cocaine, heroin, ecstasy, MDMA or other)                For at least one week prior to your surgery.  Combination of these drugs with anesthesia                May have life threatening results.  ____  7.  Bring all medications with you on the day of surgery if instructed.   __X__ 8.  Notify your doctor if there is any  change in your medical condition      (cold, fever, infections).     Do not wear jewelry, make-up, hairpins, clips or nail polish. Do not wear lotions, powders, or perfumes. You may wear deodorant. Do not shave 48 hours prior to surgery. Men may shave face and neck. Do not bring valuables to the hospital.    [facility] is not responsible for any belongings or valuables.  Contacts, dentures or bridgework may not be worn into surgery. Leave your suitcase in the car. After surgery it may be brought to your room. For patients admitted to the hospital, discharge time is determined by your treatment team.   Patients discharged the day of surgery will not be allowed to drive home.  Make arrangements for someone to be with you for the first 24 hours of your Same Day Discharge.   __X__ Take these medicines the morning of surgery with A SIP OF WATER:    1. busPIRone (BUSPAR) 7.5 MG   2. gabapentin (NEURONTIN) 600 MG   3. omeprazole (PRILOSEC) 20 MG   4.  5.  6.  ____ Fleet Enema (as directed)   __X__ Use CHG Soap (or wipes) as directed  __X__ Use Benzoyl Peroxide Gel as instructed  ____ Use inhalers on the day of surgery  __X__ Stop metFORMIN (GLUCOPHAGE-XR) 500 MG 2 days prior to surgery (take last dose 01/02/23)  __X__ Stop FARXIGA 10 MG 3 days prior to surgery (take last dose 01/01/23)  __X__ Stop OZEMPIC, 1 MG/DOSE, 4 MG/3ML SOPN 1 week prior to surgery. (Take last dose 12/27/22)    __X__ Take 1/2 of usual insulin dose the night before surgery. insulin degludec (TRESIBA FLEXTOUCH) 100 UNIT/ML FlexTouch Pen      NO insulin the morning of surgery.   __X__ Stop aspirin 81 mg 7 days prior to surgery.(Take last dose 12/28/22)  __X__ One Week prior to surgery- Stop Anti-inflammatories such as Ibuprofen, Aleve, Advil, Motrin, meloxicam (MOBIC), diclofenac, etodolac, ketorolac, Toradol, Daypro, piroxicam, Goody's or BC powders. OK TO USE TYLENOL IF NEEDED   __X__ Stop supplements  until after surgery. Omega-3 Fatty Acids (FISH OIL) and Ascorbic Acid (VITAMIN C)   __X__ Bring C-Pap to the hospital.    If you have any questions regarding your pre-procedure instructions,  Please call Pre-admit Testing at [phone] Preparing for Total Shoulder Arthroplasty  Before surgery, you can play an important role by reducing the number of germs on your skin by using the following products:  Benzoyl Peroxide Gel  o Reduces the number of germs present on the skin  o Applied twice a day to shoulder area starting two days before surgery  Chlorhexidine Gluconate (CHG) Soap  o An antiseptic cleaner that kills germs and bonds with the skin to continue killing germs even after washing  o Used for showering the night before surgery and morning of surgery  BENZOYL PEROXIDE 5% GEL  Please do not use if you have an allergy to benzoyl peroxide. If your skin becomes reddened/irritated stop using the benzoyl peroxide.  Starting two days before surgery, apply as follows:  1. Apply benzoyl peroxide in the morning and at night. Apply after taking a shower. If you are not taking a shower clean entire shoulder front, back, and side along with the armpit with a clean wet washcloth.  2. Place a quarter-sized dollop on your shoulder and rub in thoroughly, making sure to cover the front, back, and side of your shoulder, along with the armpit.  2 days before ____ AM ____ PM 1 day before ____ AM ____ PM  3. Do this twice a day for two days. (Last application is the night before surgery, AFTER using the CHG soap).  4. Do NOT apply benzoyl peroxide gel on the day of surgery.     Preparing for Surgery with CHLORHEXIDINE GLUCONATE (CHG) Soap  Chlorhexidine Gluconate (CHG) Soap  o An antiseptic cleaner that kills germs and bonds with the skin to continue killing germs even after washing  o Used for showering the night before surgery and morning of surgery  Before surgery, you can play  an important role by reducing the number of germs on your skin.  CHG (Chlorhexidine gluconate) soap is an antiseptic cleanser which  kills germs and bonds with the skin to continue killing germs even after washing.  Please do not use if you have an allergy to CHG or antibacterial soaps. If your skin becomes reddened/irritated stop using the CHG.  1. Shower the NIGHT BEFORE SURGERY and the MORNING OF SURGERY with CHG soap.  2. If you choose to wash your hair, wash your hair first as usual with your normal shampoo.  3. After shampooing, rinse your hair and body thoroughly to remove the shampoo.  4. Use CHG as you would any other liquid soap. You can apply CHG directly to the skin and wash gently with a scrungie or a clean washcloth.  5. Apply the CHG soap to your body only from the neck down. Do not use on open wounds or open sores. Avoid contact with your eyes, ears, mouth, and genitals (private parts). Wash face and genitals (private parts) with your normal soap.  6. Wash thoroughly, paying special attention to the area where your surgery will be performed.  7. Thoroughly rinse your body with warm water.  8. Do not shower/wash with your normal soap after using and rinsing off the CHG soap.  9. Pat yourself dry with a clean towel.  10. Wear clean pajamas to bed the night before surgery.  12. Place clean sheets on your bed the night of your first shower and do not sleep with pets.  13. Shower again with the CHG soap on the day of surgery prior to arriving at the hospital.  14. Do not apply any deodorants/lotions/powders.  15. Please wear clean clothes to the hospital.

## 2022-12-22 NOTE — Progress Notes (Signed)
  Perioperative Services Pre-Admission/Anesthesia Testing    Date: 12/22/22  Name: Eric Andrews MRN:   [id number]  Re: GLP-1 clearance and provider recommendations   Planned Surgical Procedure(s):    Case: [id number] Date/Time: 01/05/23 0730   Procedure: Left total shoulder arthroplasty, biceps tenodesis (Left: Shoulder)   Anesthesia type: Choice   Pre-op diagnosis:      Osteoarthritis of glenohumeral joint, left M19.012     Left shoulder pain, unspecified chronicity M25.512   Location: [facility] OR ROOM 01 / [facility] ORS FOR ANESTHESIA GROUP   Surgeons: Leim Fabry, MD   Clinical Notes:  Patient is scheduled for the above procedure with the indicated provider/surgeon. In review of his medication reconciliation it was noted that patient is on a prescribed GLP-1 medication. Per guidelines issued by the American Society of Anesthesiologists (ASA), it is recommended that these medications be held for 7 days prior to the patient undergoing any type of elective surgical procedure. The patient is taking the following GLP-1 medication:  '[x]'$  SEMAGLUTIDE   '[]'$  EXENATIDE  '[]'$  LIRAGLUTIDE   '[]'$  LIXISENATIDE  '[]'$  DULAGLUTIDE     '[]'$  OTHER GLP-1 medication: _______________  Reached out to prescribing provider [name], MD) to make them aware of the guidelines from anesthesia. Given that this patient takes the prescribed GLP-1 medication for his  diabetes diagnosis, rather than for weight loss, recommendations from the prescribing provider were solicited. Prescribing provider made aware of the following so that informed decision/POC can be developed for this patient that may be taking medications belonging to these drug classes:  Oral GLP-1 medications will be held 1 day prior to surgery.  Injectable GLP-1 medications will be held 7 days prior to surgery.  Metformin is routinely held 48 hours prior to surgery due to renal concerns, potential need for contrasted imaging perioperatively, and the potential  for tissue hypoxia leading to drug induced lactic acidosis.  All SGLT2i medications are held 72 hours prior to surgery as they can be associated with the increased potential for developing euglycemic diabetic ketoacidosis (EDKA).   Impression and Plan:  Eric Andrews is on a prescribed GLP-1 medication, which induces the known side effect of decreased gastric emptying. Efforts are bring made to mitigate the risk of perioperative hyperglycemic events, as elevated blood glucose levels have been found to contribute to intra/postoperative complications. Additionally, hyperglycemic extremes can potentially necessitate the postponing of a patient's elective case in order to better optimize perioperative glycemic control, again with the aforementioned guidelines in place. With this in mind, recommendations have been sought from the prescribing provider, who has cleared patient to proceed with holding the prescribed GLP-1 as per the guidelines from the ASA.   Provider recommending: no further recommendations received from the prescribing provider.  Copy of signed clearance and recommendations placed on patient's chart for inclusion in their medical record and for review by the surgical/anesthetic team on the day of his procedure.   Honor Loh, MSN, APRN, FNP-C, CEN [facility]  Peri-operative Services Nurse Practitioner Phone: [phone] 12/22/22 3:21 PM  NOTE: This note has been prepared using Dragon dictation software. Despite my best ability to proofread, there is always the potential that unintentional transcriptional errors may still occur from this process.

## 2022-12-24 ENCOUNTER — Other Ambulatory Visit: Payer: Medicare Other

## 2023-01-04 ENCOUNTER — Ambulatory Visit: Payer: Medicare Other | Admitting: Pharmacist

## 2023-01-04 DIAGNOSIS — Z794 Long term (current) use of insulin: Secondary | ICD-10-CM

## 2023-01-04 NOTE — Patient Instructions (Signed)
Goals Addressed             This Visit's Progress    Pharmacy Goals       Our goal A1c is less than 7%. This corresponds with fasting sugars less than 130 and 2 hour after meal sugars less than 180. Please check your blood sugar and keep a log of the results  Please check your home blood pressure, keep a log of the results and bring this with you to your medical appointments.  Our goal bad cholesterol, or LDL, is less than 70 . This is why it is important to continue taking your atorvastatin.  Neola Worrall Talena Neira, PharmD, BCACP Clinical Pharmacist [facility] [place] [phone]        

## 2023-01-04 NOTE — Progress Notes (Signed)
01/04/2023 Name: Eric Andrews MRN: [id number] DOB: March 05, 1951  Chief Complaint  Patient presents with   Medication Assistance    Eric Andrews is a 72 y.o. year old male who presented for a telephone visit.   They were referred to the pharmacist by their PCP for assistance in managing medication access.    Subjective:  Note patient scheduled to have left shoulder surgery tomorrow, 01/05/2023    Care Team: Primary Care Provider: [name], DO Urology: [name], MD Neurology: Vickki Hearing, MD; Next Scheduled Visit: 03/02/2023 [facility] Pulmonology: Tyler Pita, MD  Medication Access/Adherence  Current Pharmacy:  [facility] [phone] [address], [address] [place] [address] Phone: [phone] Fax: [phone]  [facility] [place], [place] [address] AT [address] [place] [address] Phone: [phone] Fax: [phone]   Patient reports affordability concerns with their medications: No  Patient reports access/transportation concerns to their pharmacy: No  Patient reports adherence concerns with their medications:  No       Diabetes:   Current medications:  Tresiba 60 units daily in evening Novolog 14 units three times daily with meals  Metformin ER 500 mg - 2 tablets (1000 mg) twice daily Pioglitazone 15 mg once daily Farxiga 10 mg daily  Ozempic 1 mg weekly   Current glucose readings: recalls recent morning fasting ranging 80-150   Patient denies hypoglycemic s/sx including dizziness, shakiness, sweating.   Note patient scheduled to have left shoulder surgery tomorrow, 01/05/2023 - Reports received and following medication instructions as received from pre-admission testing appointment. - Denies further medication questions/concerns at this time   Current  medication access support: Patient approved for re-enrollment in assistance programs from [employer] for [place] and from [place] for [place] - Reports now has plenty of each of his medications   Objective:  Lab Results  Component Value Date   HGBA1C 6.8 (H) 12/22/2022    Lab Results  Component Value Date   CREATININE 1.00 12/22/2022   BUN 27 (H) 12/22/2022   NA 137 12/22/2022   K 3.7 12/22/2022   CL 100 12/22/2022   CO2 30 12/22/2022    Lab Results  Component Value Date   CHOL 122 07/01/2021   HDL 33 (L) 07/01/2021   LDLCALC 62 07/01/2021   TRIG 209 (H) 07/01/2021   CHOLHDL 3.7 07/01/2021   BP Readings from Last 3 Encounters:  12/22/22 120/77  11/23/22 138/84  10/01/22 130/76   Pulse Readings from Last 3 Encounters:  12/22/22 82  11/23/22 87  10/01/22 91     Medications Reviewed Today     Reviewed by Beatris Si, RN (Registered Nurse) on 12/22/22 at [place] List Status: RN Complete   Medication Order Taking? Sig Documenting Provider Last Dose Status Informant  albuterol (VENTOLIN HFA) 108 (90 Base) MCG/ACT inhaler [id number] No Inhale 2 puffs into the lungs every 6 (six) hours as needed for wheezing or shortness of breath.  Patient not taking: Reported on 12/14/2022   [name], DO Not Taking Active   Ascorbic Acid (VITAMIN C) 1000 MG tablet [id number] Yes Take 1,000 mg by mouth daily. [provider]  Active Self  aspirin 81 MG EC tablet [id number] Yes Take 81 mg by mouth daily. [provider]  Active Self  atorvastatin (LIPITOR) 20 MG tablet [id number] Yes Take 1 tablet (20 mg total) by mouth at bedtime. Karamalegos, [name], DO  Active   Bacillus Coagulans-Inulin (PROBIOTIC) 1-250 BILLION-MG CAPS [id number] Yes Take 1 capsule by mouth daily. Ward, Delice Bison, DO Past Month Active Self  busPIRone (BUSPAR) 7.5 MG tablet [id number] Yes Take 7.5 mg by mouth 2 (two) times daily. [provider]   Active Self  celecoxib (CELEBREX) 200 MG capsule [id number] Yes Take 1 capsule (200 mg total) by mouth 2 (two) times daily. [name], DO  Active   cetirizine (ZYRTEC) 10 MG tablet [id number] Yes TAKE 1 TABLET(10 MG) BY MOUTH AT BEDTIME Karamalegos, [name], DO  Active   chlorthalidone (HYGROTON) 25 MG tablet [id number] Yes Take 1 tablet (25 mg total) by mouth daily. Karamalegos, [name], DO  Active   cholecalciferol (VITAMIN D3) 25 MCG (1000 UNIT) tablet [id number] Yes Take 1,000 Units by mouth in the morning and at bedtime. [provider]  Active Self  FARXIGA 10 MG TABS tablet [id number] Yes Take 1 tablet (10 mg total) by mouth daily before breakfast. [name], DO  Active   fenofibrate micronized (LOFIBRA) 134 MG capsule [id number] Yes Take 1 capsule (134 mg total) by mouth daily before breakfast. TAKE 1 CAPSULE(134 MG) BY MOUTH DAILY BEFORE BREAKFAST [name], DO  Active   finasteride (PROSCAR) 5 MG tablet [id number] Yes TAKE 1 TABLET(5 MG) BY MOUTH EVERY EVENING Karamalegos, [name], DO  Active   fluticasone (FLONASE) 50 MCG/ACT nasal spray [id number] Yes Place 2 sprays into both nostrils daily. Use for 4-6 weeks then stop and use seasonally or as needed. Karamalegos, [name], DO  Active   gabapentin (NEURONTIN) 600 MG tablet [id number] Yes Take 600 mg by mouth in the morning, at noon, in the evening, and at bedtime. [provider]  Active Self           Med Note Nat Christen   Tue Apr 07, 2022  5:10 PM)    hydrocortisone 2.5 % cream [id number] Yes Apply to aa's of face and ears QD on Monday, Wednesday, and Friday.  Patient taking differently: Apply 1 application  topically daily as needed (irritation).   Ralene Bathe, MD  Active Self  insulin degludec [facility]) 100 UNIT/ML FlexTouch Pen [id number] Yes Inject 68 Units into the skin at bedtime.  Patient taking differently: Inject 60 Units into the skin  every evening.   [name], DO  Active Self  Insulin Pen Needle 31G X 5 MM MISC [id number] Yes Use with insulin to inject into skin 3 times daily as directed [name], DO  Active Self  ketoconazole (NIZORAL) 2 % cream [id number] Yes Apply to aa's face and ears QD on Tuesday, Thursday, and Saturday.  Patient taking differently: Apply 1 application  topically daily as needed for irritation.   Ralene Bathe, MD  Active Self  losartan (COZAAR) 50 MG tablet [id number] Yes Take 1 tablet (50 mg total) by mouth daily. Karamalegos, [name], DO  Active   metFORMIN (GLUCOPHAGE-XR) 500 MG 24 hr tablet [id number] Yes Take 2 tablets (1,000 mg total) by mouth 2 (two) times daily with a meal. Add refills for future [name], DO  Active   Multiple Vitamin (MULTIVITAMIN) capsule [id number] Yes Take 1 capsule by mouth daily. [provider] Past Month Active Self  NOVOLOG FLEXPEN 100 UNIT/ML FlexPen [id number] Yes Inject  14 Units into the skin 3 (three) times daily with meals. Sliding scale [name], DO  Active Self  Omega-3 Fatty Acids (FISH OIL) 1200 MG CAPS [id number] Yes Take 1,200 mg by mouth in the morning and at bedtime. [provider]  Active Self  omeprazole (PRILOSEC) 20 MG capsule [id number] Yes TAKE ONE CAPSULE BY MOUTH DAILY BEFORE BREAKFAST Karamalegos, [name], DO  Active   OZEMPIC, 1 MG/DOSE, 4 MG/3ML SOPN [id number] Yes Inject 1 mg into the skin once a week. [name], DO  Active Self  pioglitazone (ACTOS) 15 MG tablet [id number] Yes Take 1 tablet (15 mg total) by mouth daily. OFFICE VISIT NEEDED FOR ADDITIONAL REFILLS, CALL OFFICE TO SCHEDULE Karamalegos, [name], DO  Active   tamsulosin (FLOMAX) 0.4 MG CAPS capsule [id number] Yes TAKE 1 CAPSULE BY MOUTH EVERY NIGHT AT BEDTIME FOR URINE RETENTION [name], DO  Active   traMADol (ULTRAM) 50 MG tablet [id number] Yes Take 1 tablet (50 mg  total) by mouth every 6 (six) hours as needed.  Patient taking differently: Take 50 mg by mouth daily.   Duanne Guess, PA-C  Active   traZODone (DESYREL) 100 MG tablet [id number] Yes Take 1.5 tablets (150 mg total) by mouth at bedtime.  Patient taking differently: Take 200 mg by mouth at bedtime.   [name], DO  Active   venlafaxine XR (EFFEXOR-XR) 150 MG 24 hr capsule [id number] Yes Take 1 capsule (150 mg total) by mouth daily with breakfast. [name], DO  Active               Assessment/Plan:   Have advised patient to contact PCP office to schedule due follow up appointment   Diabetes: - Currently controlled - Recommend to check glucose and keep log of results, have this log to review at upcoming appointments, but to contact office sooner if needed for readings outside of established parameters - Review with patient the process for refilling his medications as needed from [employer] and [employer] patient assistance programs   Follow Up Plan:  Patient denies further medication questions or concerns today Clinical Pharmacist will follow up with patient by telephone again in November 2024 Confirms has contact information for clinic pharmacist to contact if needed sooner for medication questions/concerns   Wallace Cullens, PharmD, [place], [facility] [phone]

## 2023-01-05 ENCOUNTER — Other Ambulatory Visit: Payer: Self-pay

## 2023-01-05 ENCOUNTER — Encounter: Payer: Self-pay | Admitting: Orthopedic Surgery

## 2023-01-05 ENCOUNTER — Ambulatory Visit: Payer: Medicare Other

## 2023-01-05 ENCOUNTER — Encounter
Admission: RE | Disposition: A | Discharge status: Home / Self Care | Payer: Self-pay | Source: Home / Self Care | Attending: Orthopedic Surgery

## 2023-01-05 ENCOUNTER — Ambulatory Visit
Admission: RE | Admit: 2023-01-05 | Discharge: 2023-01-05 | Disposition: A | Discharge status: Home / Self Care | Payer: Medicare Other | Attending: Orthopedic Surgery | Admitting: Orthopedic Surgery

## 2023-01-05 ENCOUNTER — Ambulatory Visit: Payer: Medicare Other | Admitting: Urgent Care

## 2023-01-05 DIAGNOSIS — G8929 Other chronic pain: Secondary | ICD-10-CM | POA: Diagnosis not present

## 2023-01-05 DIAGNOSIS — G709 Myoneural disorder, unspecified: Secondary | ICD-10-CM | POA: Insufficient documentation

## 2023-01-05 DIAGNOSIS — E1169 Type 2 diabetes mellitus with other specified complication: Secondary | ICD-10-CM

## 2023-01-05 DIAGNOSIS — G4730 Sleep apnea, unspecified: Secondary | ICD-10-CM | POA: Diagnosis not present

## 2023-01-05 DIAGNOSIS — K219 Gastro-esophageal reflux disease without esophagitis: Secondary | ICD-10-CM | POA: Diagnosis not present

## 2023-01-05 DIAGNOSIS — F988 Other specified behavioral and emotional disorders with onset usually occurring in childhood and adolescence: Secondary | ICD-10-CM | POA: Insufficient documentation

## 2023-01-05 DIAGNOSIS — M24012 Loose body in left shoulder: Secondary | ICD-10-CM | POA: Diagnosis not present

## 2023-01-05 DIAGNOSIS — E119 Type 2 diabetes mellitus without complications: Secondary | ICD-10-CM | POA: Diagnosis not present

## 2023-01-05 DIAGNOSIS — M19012 Primary osteoarthritis, left shoulder: Secondary | ICD-10-CM | POA: Insufficient documentation

## 2023-01-05 DIAGNOSIS — I10 Essential (primary) hypertension: Secondary | ICD-10-CM | POA: Diagnosis not present

## 2023-01-05 DIAGNOSIS — Z96612 Presence of left artificial shoulder joint: Secondary | ICD-10-CM | POA: Diagnosis not present

## 2023-01-05 DIAGNOSIS — Z471 Aftercare following joint replacement surgery: Secondary | ICD-10-CM | POA: Diagnosis not present

## 2023-01-05 DIAGNOSIS — G8918 Other acute postprocedural pain: Secondary | ICD-10-CM | POA: Diagnosis not present

## 2023-01-05 DIAGNOSIS — M7582 Other shoulder lesions, left shoulder: Secondary | ICD-10-CM | POA: Diagnosis not present

## 2023-01-05 DIAGNOSIS — M7522 Bicipital tendinitis, left shoulder: Secondary | ICD-10-CM | POA: Diagnosis not present

## 2023-01-05 HISTORY — PX: TOTAL SHOULDER ARTHROPLASTY: SHX126

## 2023-01-05 LAB — GLUCOSE, CAPILLARY
Glucose-Capillary: 172 mg/dL — ABNORMAL HIGH (ref 70–99)
Glucose-Capillary: 214 mg/dL — ABNORMAL HIGH (ref 70–99)

## 2023-01-05 SURGERY — ARTHROPLASTY, SHOULDER, TOTAL
Anesthesia: General | Site: Shoulder | Laterality: Left

## 2023-01-05 MED ORDER — FENTANYL CITRATE (PF) 100 MCG/2ML IJ SOLN
INTRAMUSCULAR | Status: AC
  Filled 2023-01-05: qty 2

## 2023-01-05 MED ORDER — ACETAMINOPHEN 500 MG PO TABS: 1000.0000 mg | ORAL_TABLET | Freq: Three times a day (TID) | ORAL | 2 refills | Status: DC

## 2023-01-05 MED ORDER — EPHEDRINE SULFATE (PRESSORS) 50 MG/ML IJ SOLN
INTRAMUSCULAR | Status: DC | PRN
  Administered 2023-01-05: 15 mg via INTRAVENOUS
  Administered 2023-01-05 (×2): 5 mg via INTRAVENOUS
  Administered 2023-01-05: 10 mg via INTRAVENOUS

## 2023-01-05 MED ORDER — BUPIVACAINE HCL (PF) 0.25 % IJ SOLN
INTRAMUSCULAR | Status: AC
  Filled 2023-01-05: qty 30

## 2023-01-05 MED ORDER — DEXAMETHASONE SODIUM PHOSPHATE 10 MG/ML IJ SOLN
INTRAMUSCULAR | Status: AC
  Filled 2023-01-05: qty 1

## 2023-01-05 MED ORDER — SUCCINYLCHOLINE CHLORIDE 200 MG/10ML IV SOSY
PREFILLED_SYRINGE | INTRAVENOUS | Status: AC
  Filled 2023-01-05: qty 10

## 2023-01-05 MED ORDER — ONDANSETRON HCL 4 MG/2ML IJ SOLN
INTRAMUSCULAR | Status: DC | PRN
  Administered 2023-01-05: 4 mg via INTRAVENOUS

## 2023-01-05 MED ORDER — CEFAZOLIN IN SODIUM CHLORIDE 3-0.9 GM/100ML-% IV SOLN
3.0000 g | INTRAVENOUS | Status: AC
  Administered 2023-01-05: 3 g via INTRAVENOUS
  Filled 2023-01-05 (×2): qty 100

## 2023-01-05 MED ORDER — VANCOMYCIN HCL 1000 MG IV SOLR
INTRAVENOUS | Status: AC
  Filled 2023-01-05: qty 20

## 2023-01-05 MED ORDER — HEMOSTATIC AGENTS (NO CHARGE) OPTIME
TOPICAL | Status: DC | PRN
  Administered 2023-01-05: 1 via TOPICAL

## 2023-01-05 MED ORDER — DEXAMETHASONE SODIUM PHOSPHATE 10 MG/ML IJ SOLN
INTRAMUSCULAR | Status: DC | PRN
  Administered 2023-01-05: 10 mg via INTRAVENOUS

## 2023-01-05 MED ORDER — FENTANYL CITRATE (PF) 100 MCG/2ML IJ SOLN: 25.0000 ug | INTRAMUSCULAR | Status: DC | PRN

## 2023-01-05 MED ORDER — OXYCODONE HCL 5 MG/5ML PO SOLN: 5.0000 mg | Freq: Once | ORAL | Status: DC | PRN

## 2023-01-05 MED ORDER — CEFAZOLIN IN SODIUM CHLORIDE 3-0.9 GM/100ML-% IV SOLN
3.0000 g | Freq: Three times a day (TID) | INTRAVENOUS | Status: AC
  Administered 2023-01-05: 3 g via INTRAVENOUS
  Filled 2023-01-05: qty 100

## 2023-01-05 MED ORDER — SODIUM CHLORIDE 0.9 % IV SOLN: INTRAVENOUS | Status: DC

## 2023-01-05 MED ORDER — THROMBIN 5000 UNITS EX SOLR
CUTANEOUS | Status: DC | PRN
  Administered 2023-01-05: 5000 [IU] via TOPICAL

## 2023-01-05 MED ORDER — GELATIN ABSORBABLE 100 CM EX MISC
CUTANEOUS | Status: AC
  Filled 2023-01-05: qty 1

## 2023-01-05 MED ORDER — EPHEDRINE 5 MG/ML INJ
INTRAVENOUS | Status: AC
  Filled 2023-01-05: qty 5

## 2023-01-05 MED ORDER — TRANEXAMIC ACID-NACL 1000-0.7 MG/100ML-% IV SOLN
1000.0000 mg | INTRAVENOUS | Status: AC
  Administered 2023-01-05: 1000 mg via INTRAVENOUS

## 2023-01-05 MED ORDER — BUPIVACAINE LIPOSOME 1.3 % IJ SUSP
INTRAMUSCULAR | Status: DC | PRN
  Administered 2023-01-05: 20 mL via PERINEURAL

## 2023-01-05 MED ORDER — VANCOMYCIN HCL 1000 MG IV SOLR
INTRAVENOUS | Status: DC | PRN
  Administered 2023-01-05: 1000 mg via TOPICAL

## 2023-01-05 MED ORDER — FENTANYL CITRATE PF 50 MCG/ML IJ SOSY
PREFILLED_SYRINGE | INTRAMUSCULAR | Status: AC
  Administered 2023-01-05: 50 ug via INTRAVENOUS
  Filled 2023-01-05: qty 1

## 2023-01-05 MED ORDER — BUPIVACAINE LIPOSOME 1.3 % IJ SUSP
INTRAMUSCULAR | Status: AC
  Filled 2023-01-05: qty 20

## 2023-01-05 MED ORDER — TRANEXAMIC ACID-NACL 1000-0.7 MG/100ML-% IV SOLN
INTRAVENOUS | Status: AC
  Filled 2023-01-05: qty 100

## 2023-01-05 MED ORDER — SUGAMMADEX SODIUM 200 MG/2ML IV SOLN
INTRAVENOUS | Status: DC | PRN
  Administered 2023-01-05 (×2): 100 mg via INTRAVENOUS

## 2023-01-05 MED ORDER — LIDOCAINE HCL (PF) 2 % IJ SOLN
INTRAMUSCULAR | Status: AC
  Filled 2023-01-05: qty 5

## 2023-01-05 MED ORDER — ROCURONIUM BROMIDE 100 MG/10ML IV SOLN
INTRAVENOUS | Status: DC | PRN
  Administered 2023-01-05: 50 mg via INTRAVENOUS
  Administered 2023-01-05: 20 mg via INTRAVENOUS

## 2023-01-05 MED ORDER — MIDAZOLAM HCL 2 MG/2ML IJ SOLN
INTRAMUSCULAR | Status: AC
  Administered 2023-01-05: 1 mg via INTRAVENOUS
  Filled 2023-01-05: qty 2

## 2023-01-05 MED ORDER — FENTANYL CITRATE (PF) 100 MCG/2ML IJ SOLN
INTRAMUSCULAR | Status: DC | PRN
  Administered 2023-01-05 (×2): 50 ug via INTRAVENOUS

## 2023-01-05 MED ORDER — LACTATED RINGERS IV SOLN: INTRAVENOUS | Status: DC | PRN

## 2023-01-05 MED ORDER — NEOMYCIN-POLYMYXIN B GU 40-200000 IR SOLN
Status: AC
  Filled 2023-01-05: qty 20

## 2023-01-05 MED ORDER — THROMBIN 5000 UNITS EX SOLR
CUTANEOUS | Status: AC
  Filled 2023-01-05: qty 5000

## 2023-01-05 MED ORDER — PROPOFOL 10 MG/ML IV BOLUS
INTRAVENOUS | Status: DC | PRN
  Administered 2023-01-05: 150 mg via INTRAVENOUS

## 2023-01-05 MED ORDER — ORAL CARE MOUTH RINSE: 15.0000 mL | Freq: Once | OROMUCOSAL | Status: AC

## 2023-01-05 MED ORDER — LIDOCAINE HCL (PF) 1 % IJ SOLN
INTRAMUSCULAR | Status: AC
  Filled 2023-01-05: qty 5

## 2023-01-05 MED ORDER — OXYCODONE HCL 5 MG PO TABS: 5.0000 mg | ORAL_TABLET | Freq: Once | ORAL | Status: DC | PRN

## 2023-01-05 MED ORDER — GELATIN ABSORBABLE 12-7 MM EX MISC
CUTANEOUS | Status: AC
  Filled 2023-01-05: qty 1

## 2023-01-05 MED ORDER — LIDOCAINE HCL (CARDIAC) PF 100 MG/5ML IV SOSY
PREFILLED_SYRINGE | INTRAVENOUS | Status: DC | PRN
  Administered 2023-01-05: 100 mg via INTRAVENOUS

## 2023-01-05 MED ORDER — 0.9 % SODIUM CHLORIDE (POUR BTL) OPTIME
TOPICAL | Status: DC | PRN
  Administered 2023-01-05: 500 mL

## 2023-01-05 MED ORDER — SUCCINYLCHOLINE CHLORIDE 200 MG/10ML IV SOSY
PREFILLED_SYRINGE | INTRAVENOUS | Status: DC | PRN
  Administered 2023-01-05: 140 mg via INTRAVENOUS

## 2023-01-05 MED ORDER — CHLORHEXIDINE GLUCONATE 0.12 % MT SOLN
OROMUCOSAL | Status: AC
  Administered 2023-01-05: 15 mL via OROMUCOSAL
  Filled 2023-01-05: qty 15

## 2023-01-05 MED ORDER — SODIUM CHLORIDE 0.9 % IV SOLN
Status: DC | PRN
  Administered 2023-01-05: 3012 mL

## 2023-01-05 MED ORDER — CHLORHEXIDINE GLUCONATE 0.12 % MT SOLN: 15.0000 mL | Freq: Once | OROMUCOSAL | Status: AC

## 2023-01-05 MED ORDER — NOREPINEPHRINE 4 MG/250ML-% IV SOLN
INTRAVENOUS | Status: DC | PRN
  Administered 2023-01-05: 70 ug/min via INTRAVENOUS

## 2023-01-05 MED ORDER — ASPIRIN 325 MG PO TBEC: 325.0000 mg | DELAYED_RELEASE_TABLET | Freq: Every day | ORAL | 0 refills | Status: AC

## 2023-01-05 MED ORDER — ONDANSETRON HCL 4 MG/2ML IJ SOLN
INTRAMUSCULAR | Status: AC
  Filled 2023-01-05: qty 2

## 2023-01-05 MED ORDER — MIDAZOLAM HCL 2 MG/2ML IJ SOLN: 1.0000 mg | Freq: Once | INTRAMUSCULAR | Status: AC

## 2023-01-05 MED ORDER — FENTANYL CITRATE PF 50 MCG/ML IJ SOSY: 50.0000 ug | PREFILLED_SYRINGE | Freq: Once | INTRAMUSCULAR | Status: AC

## 2023-01-05 MED ORDER — PHENYLEPHRINE HCL-NACL 20-0.9 MG/250ML-% IV SOLN
INTRAVENOUS | Status: AC
  Filled 2023-01-05: qty 250

## 2023-01-05 MED ORDER — PROPOFOL 10 MG/ML IV BOLUS
INTRAVENOUS | Status: AC
  Filled 2023-01-05: qty 20

## 2023-01-05 MED ORDER — BUPIVACAINE HCL (PF) 0.5 % IJ SOLN
INTRAMUSCULAR | Status: DC | PRN
  Administered 2023-01-05: 10 mL via PERINEURAL

## 2023-01-05 MED ORDER — ONDANSETRON 4 MG PO TBDP: 4.0000 mg | ORAL_TABLET | Freq: Three times a day (TID) | ORAL | 0 refills | Status: DC | PRN

## 2023-01-05 MED ORDER — BUPIVACAINE HCL (PF) 0.5 % IJ SOLN
INTRAMUSCULAR | Status: AC
  Filled 2023-01-05: qty 10

## 2023-01-05 MED ORDER — OXYCODONE HCL 5 MG PO TABS: 5.0000 mg | ORAL_TABLET | ORAL | 0 refills | Status: DC | PRN

## 2023-01-05 MED ORDER — ROCURONIUM BROMIDE 10 MG/ML (PF) SYRINGE
PREFILLED_SYRINGE | INTRAVENOUS | Status: AC
  Filled 2023-01-05: qty 10

## 2023-01-05 MED ORDER — EPINEPHRINE PF 1 MG/ML IJ SOLN
INTRAMUSCULAR | Status: AC
  Filled 2023-01-05: qty 1

## 2023-01-05 MED ORDER — PHENYLEPHRINE HCL (PRESSORS) 10 MG/ML IV SOLN
INTRAVENOUS | Status: DC | PRN
  Administered 2023-01-05 (×2): 120 ug via INTRAVENOUS
  Administered 2023-01-05: 160 ug via INTRAVENOUS

## 2023-01-05 MED ORDER — BUPIVACAINE LIPOSOME 1.3 % IJ SUSP
INTRAMUSCULAR | Status: AC
  Filled 2023-01-05: qty 10

## 2023-01-05 SURGICAL SUPPLY — 88 items
ADH SKN CLS APL DERMABOND .7 (GAUZE/BANDAGES/DRESSINGS) ×2
APL PRP STRL LF DISP 70% ISPRP (MISCELLANEOUS) ×2
BLADE SAGITTAL WIDE XTHICK NO (BLADE) ×1 IMPLANT
BLADE SURG 15 STRL LF DISP TIS (BLADE) ×1 IMPLANT
BLADE SURG 15 STRL SS (BLADE) ×1
BOWL CEMENT MIX W/ADAPTER (MISCELLANEOUS) IMPLANT
CEMENT BONE 40GM (Cement) IMPLANT
CHLORAPREP W/TINT 26 (MISCELLANEOUS) ×1 IMPLANT
CNTNR URN SCR LID CUP LEK RST (MISCELLANEOUS) ×1 IMPLANT
CONT SPEC 4OZ STRL OR WHT (MISCELLANEOUS) ×1
COOLER POLAR GLACIER W/PUMP (MISCELLANEOUS) ×1 IMPLANT
COVER BACK TABLE REUSABLE LG (DRAPES) ×1 IMPLANT
DERMABOND ADVANCED .7 DNX12 (GAUZE/BANDAGES/DRESSINGS) ×2 IMPLANT
DRAPE 3/4 80X56 (DRAPES) ×2 IMPLANT
DRAPE INCISE IOBAN 66X45 STRL (DRAPES) ×2 IMPLANT
DRAPE U-SHAPE 47X51 STRL (DRAPES) ×1 IMPLANT
DRSG OPSITE POSTOP 4X8 (GAUZE/BANDAGES/DRESSINGS) ×1 IMPLANT
DRSG TEGADERM 2-3/8X2-3/4 SM (GAUZE/BANDAGES/DRESSINGS) ×1 IMPLANT
ELECT REM PT RETURN 9FT ADLT (ELECTROSURGICAL) ×1
ELECTRODE REM PT RTRN 9FT ADLT (ELECTROSURGICAL) ×1 IMPLANT
GAUZE XEROFORM 1X8 LF (GAUZE/BANDAGES/DRESSINGS) ×1 IMPLANT
GLENOID PEGGED CORTILOC M40 (Orthopedic Implant) IMPLANT
GLOVE BIO SURGEON STRL SZ7.5 (GLOVE) ×1 IMPLANT
GLOVE BIOGEL PI IND STRL 8 (GLOVE) ×2 IMPLANT
GLOVE SURG ORTHO 8.0 STRL STRW (GLOVE) ×1 IMPLANT
GOWN STRL REUS W/ TWL LRG LVL3 (GOWN DISPOSABLE) ×2 IMPLANT
GOWN STRL REUS W/ TWL XL LVL3 (GOWN DISPOSABLE) ×1 IMPLANT
GOWN STRL REUS W/TWL LRG LVL3 (GOWN DISPOSABLE) ×2
GOWN STRL REUS W/TWL XL LVL3 (GOWN DISPOSABLE) ×1
GUIDE GLENOID PATIENT (MISCELLANEOUS) IMPLANT
GUIDE PIN 3X75 SHOULDER (PIN) ×2
GUIDEWIRE GLENOID 2.5X220 (WIRE) IMPLANT
HEAD HUMERAL 52X19 (Head) IMPLANT
HEMOSTAT SURGICEL 2X14 (HEMOSTASIS) IMPLANT
HEMOSTAT SURGICEL 2X3 (HEMOSTASIS) ×2 IMPLANT
HEMOVAC 400CC 10FR (MISCELLANEOUS) IMPLANT
HOLDER FOLEY CATH W/STRAP (MISCELLANEOUS) ×1 IMPLANT
HOOD PEEL AWAY T7 (MISCELLANEOUS) ×3 IMPLANT
IMPL HUM NUCLEUS SZ3 (Orthopedic Implant) IMPLANT
IV NS IRRIG 3000ML ARTHROMATIC (IV SOLUTION) ×1 IMPLANT
KIT STABILIZATION SHOULDER (MISCELLANEOUS) ×1 IMPLANT
KIT TURNOVER KIT A (KITS) ×1 IMPLANT
MANIFOLD NEPTUNE II (INSTRUMENTS) ×1 IMPLANT
MASK FACE SPIDER DISP (MASK) ×1 IMPLANT
MAT ABSORB  FLUID 56X50 GRAY (MISCELLANEOUS) ×1
MAT ABSORB FLUID 56X50 GRAY (MISCELLANEOUS) ×1 IMPLANT
NDL REVERSE CUT 1/2 CRC (NEEDLE) ×1 IMPLANT
NDL SAFETY ECLIP 18X1.5 (MISCELLANEOUS) ×1 IMPLANT
NDL SPNL 20GX3.5 QUINCKE YW (NEEDLE) ×1 IMPLANT
NEEDLE REVERSE CUT 1/2 CRC (NEEDLE) IMPLANT
NEEDLE SPNL 20GX3.5 QUINCKE YW (NEEDLE) ×1 IMPLANT
NS IRRIG 1000ML POUR BTL (IV SOLUTION) ×1 IMPLANT
NS IRRIG 500ML POUR BTL (IV SOLUTION) IMPLANT
PACK ARTHROSCOPY SHOULDER (MISCELLANEOUS) ×1 IMPLANT
PAD ARMBOARD 7.5X6 YLW CONV (MISCELLANEOUS) ×2 IMPLANT
PAD WRAPON POLAR SHDR UNIV (MISCELLANEOUS) ×1 IMPLANT
PAD WRAPON POLAR SHDR XLG (MISCELLANEOUS) IMPLANT
PIN GUIDE 3X75 SHOULDER (PIN) IMPLANT
PULSAVAC PLUS IRRIG FAN TIP (DISPOSABLE) ×1
SLING ULTRA II LG (MISCELLANEOUS) IMPLANT
SLING ULTRA II M (MISCELLANEOUS) IMPLANT
SPONGE GAUZE 2X2 8PLY STRL LF (GAUZE/BANDAGES/DRESSINGS) ×1 IMPLANT
SPONGE T-LAP 18X18 ~~LOC~~+RFID (SPONGE) ×4 IMPLANT
STAPLER SKIN PROX 35W (STAPLE) IMPLANT
STRAP SAFETY 5IN WIDE (MISCELLANEOUS) ×2 IMPLANT
SUT FIBERWIRE #2 38 BLUE 1/2 (SUTURE) ×4
SUT MNCRL 4-0 (SUTURE) ×1
SUT MNCRL 4-0 27XMFL (SUTURE) ×1
SUT MNCRL AB 4-0 PS2 18 (SUTURE) IMPLANT
SUT TICRON 2-0 30IN 311381 (SUTURE) ×3 IMPLANT
SUT VIC AB 0 CT1 27 (SUTURE) ×1
SUT VIC AB 0 CT1 27XCR 8 STRN (SUTURE) ×1 IMPLANT
SUT VIC AB 2-0 CT2 27 (SUTURE) ×2 IMPLANT
SUTURE FIBERWR #2 38 BLUE 1/2 (SUTURE) ×4 IMPLANT
SUTURE MNCRL 4-0 27XMF (SUTURE) IMPLANT
SYR 20ML LL LF (SYRINGE) ×1 IMPLANT
SYR 30ML LL (SYRINGE) ×2 IMPLANT
SYR 5ML LL (SYRINGE) ×1 IMPLANT
SYR TOOMEY IRRIG 70ML (MISCELLANEOUS) ×1
SYRINGE TOOMEY IRRIG 70ML (MISCELLANEOUS) ×1 IMPLANT
TAPE TRANSPORE STRL 2 31045 (GAUZE/BANDAGES/DRESSINGS) IMPLANT
TIP FAN IRRIG PULSAVAC PLUS (DISPOSABLE) ×1 IMPLANT
TRAP FLUID SMOKE EVACUATOR (MISCELLANEOUS) ×1 IMPLANT
TRAY FOLEY SLVR 16FR LF STAT (SET/KITS/TRAYS/PACK) ×1 IMPLANT
WATER STERILE IRR 1000ML POUR (IV SOLUTION) IMPLANT
WATER STERILE IRR 500ML POUR (IV SOLUTION) ×1 IMPLANT
WRAPON POLAR PAD SHDR UNIV (MISCELLANEOUS)
WRAPON POLAR PAD SHDR XLG (MISCELLANEOUS) ×1

## 2023-01-05 NOTE — Anesthesia Procedure Notes (Signed)
Procedure Name: Intubation Date/Time: 01/05/2023 11:31 AM  Performed by: Fredderick Phenix, CRNAPre-anesthesia Checklist: Patient identified, Emergency Drugs available, Suction available and Patient being monitored Patient Re-evaluated:Patient Re-evaluated prior to induction Oxygen Delivery Method: Circle system utilized Preoxygenation: Pre-oxygenation with 100% oxygen Induction Type: IV induction Ventilation: Mask ventilation without difficulty Laryngoscope Size: McGraph and 4 Grade View: Grade I Tube type: Oral Tube size: 7.5 mm Number of attempts: 1 Airway Equipment and Method: Stylet and Oral airway Placement Confirmation: ETT inserted through vocal cords under direct vision, positive ETCO2 and breath sounds checked- equal and bilateral Secured at: 22 cm Tube secured with: Tape Dental Injury: Teeth and Oropharynx as per pre-operative assessment

## 2023-01-05 NOTE — Discharge Instructions (Addendum)
Eric Mulligan, MD  [facility]  Phone: [phone]  Fax: [phone]   Discharge Instructions after Total Shoulder Replacement    1. Activity/Sling: You are to be non-weight bearing on operative extremity. A sling/shoulder immobilizer has been provided for you. Only remove the sling to perform the motion exercises (attached) and hygiene/dressing. Active reaching and lifting are not permitted. You will be given further instructions on sling use at your first physical therapy visit and postoperative visit with Dr. Posey Pronto.   2. Dressings: Dressing may be removed at 1st physical therapy visit (~3-4 days after surgery). Afterwards, you may either leave open to air (if no drainage) or cover with dry, sterile dressing. If you have steri-strips on your wound, please do not remove them. They will fall off on their own. You may shower 5 days after surgery. Please pat incision dry. Do not rub or place any shear forces across incision. If there is drainage or any opening of incision after 5 days, please notify our offices immediately.    3. Driving:  Plan on not driving for six weeks. Please note that you are advised NOT to drive while taking narcotic pain medications as you may be impaired and unsafe to drive.   4. Medications:  - You have been provided a prescription for narcotic pain medicine (usually oxycodone). After surgery, take 1-2 narcotic tablets every 4 hours if needed for severe pain. Please start this as soon as you begin to start having pain (if you received a nerve block, start taking as soon as this wears off).  - A prescription for anti-nausea medication will be provided in case the narcotic medicine causes nausea - take 1 tablet every 6 hours only if nauseated.  - Take enteric coated aspirin 325 mg once daily for 6 weeks to prevent blood clots. Do not take aspirin if you have an aspirin sensitivity/allergy or asthma or are on an anticoagulant (blood thinner) already. If so, then your  home anticoagulant will be resume and managed - do not take aspirin. -Take tylenol '1000mg'$  (2 Extra strength or 3 regular strength tablets) every 8 hours for pain. This will reduce the amount of narcotic medication needed. May stop tylenol when you are having minimal pain. - Take a stool softener (Colace, Dulcolax or Senakot) if you are using narcotic pain medications to help with constipation that is associated with narcotic use. - DO NOT take ANY nonsteroidal anti-inflammatory pain medications: Advil, Motrin, Ibuprofen, Aleve, Naproxen, or Naprosyn.   If you are taking prescription medication for anxiety, depression, insomnia, muscle spasm, chronic pain, or for attention deficit disorder you are advised that you are at a higher risk of adverse effects with use of narcotics post-op, including narcotic addiction/dependence, depressed breathing, death. If you use non-prescribed substances: alcohol, marijuana, cocaine, heroin, methamphetamines, etc., you are at a higher risk of adverse effects with use of narcotics post-op, including narcotic addiction/dependence, depressed breathing, death. You are advised that taking > 50 morphine milligram equivalents (MME) of narcotic pain medication per day results in twice the risk of overdose or death. For your prescription provided: oxycodone 5 mg - taking more than 6 tablets per day after the first few days of surgery.   5. Physical Therapy: 1-2 times per week for ~12 weeks. Therapy typically starts on post operative Day 3 or 4. You have been provided an order for physical therapy. The therapist will provide home exercises. Please contact our offices if this appointment has not been scheduled.  6. Work: May do light duty/desk job in approximately 2 weeks when off of narcotics, pain is well-controlled, and swelling has decreased if able to function with one arm in sling. Full work may take 6 weeks if light motions and function of both arms is required. Lifting  jobs may require 12 weeks.   7. Post-Op Appointments: Your first post-op appointment will be with Dr. Posey Pronto in approximately 2 weeks time.    If you find that they have not been scheduled please call the Orthopaedic Appointment front desk at [phone].                       Home Exercises ? Perform passive, assisted forward flexion and external rotation (outward turning) exercises with the operative arm. You were taught these exercises prior to discharge. Both exercises should be done with the non-operative arm used as the "therapist arm" while the operative arm remains completely relaxed.  ? 10 of each exercise should be done 5 times daily, work up to the max degrees  Forward Flexion Maximum: 130 deg. Lie flat on your back, completely relax your operative arm like a wet noodle, and grasp the wrist of the operative shoulder with your opposite hand. Using the power in your opposite arm, bring the stiff arm up only to the maximum indicated above (90 degrees indicates your arm pointed straight ahead). Start holding it for ten seconds and then work up to where you can hold it for a count of 30. Breathe slowly and deeply while the arm is moved. Repeat this stretch ten times.      External rotation Maximum: 30 deg. External rotation is turning the arm out to the side while your elbow stays close to your body. It is best stretched while you are lying on your back. Hold a cane, yardstick, broom handle, or golf club in both hands. Bend both elbows to a right angle. With your operative arm completely relaxed, use steady, gentle force from your normal arm to rotate the hand of the stiff shoulder out away from your body. Continue the rotation only to the maximum indicated above (90 degrees indicates your arm pointed straight ahead). Holding it there for a count of 10. Repeat this exercise ten times.   Eric Mulligan, MD  [facility]  Phone: [phone]  Fax:  [phone]   Total Shoulder Arthroplasty Rehabilitation Framework  The following is a basic framework from which to work during rehabilitation following a Shoulder Arthroplasty. However, it is critical to communicate with the surgeon in order to be aware of the condition of the tissue at the time of repair, any concomitant procedures that might have been performed, etc, that might impact the progression that is appropriate for each specific patient.  1-2x/week for 10-12 weeks.   Subscapularis Safe Zones established intraoperatively by the surgeon ? These ranges can start on Post-op day 1, but may require a few weeks to achieve depending on patient comfort ? Supine, passive, well-arm assisted: ? 130/30 Program: Max. forward flexion to 130 degrees ; Max. External rotation to 30 degrees ? No abduction  ----------------------------------------------------------------------------------------------------------------------  Phase I: Passive Motion - 0-6 weeks post-op  Goals: ? PROM - 130 degrees of flexion, ER of 30 by the end of week 6 (see above) ? Decrease pain, Decrease muscle atrophy, Educate regarding joint protection ? Provide the patient with instructions for home exercises (last pages) 5 x per day  Precautions: ? Stay within safe zone determined at  surgery (see above) ? Week 1-2: Sling with abduction pillow at all times, removed only for 5x/day exercises, showering, and dressing ? Week 3-6: Sling while out of home/uncontrolled environment, continue wearing during sleep if patient is an active sleeper. ? Week 3-6: Ok to perform waist level activities WITH ELBOW AT SIDE in front of the body o Typing, eating utensils, combing hair and washing face with elbow at side o No lifting, reaching or pulling heavier than coffee cup with elbow at side  Teaching: ? Emphasize home, supine, passive well-arm assisted PROM (FF and ER as above) ? Instruct in regular icing techniques or cold  therapy device (use as much as possible out of 24 hours for 8-10 days) ? Ice packs for 20 - 30 minutes intervals, especially at the end of an exercise session ? Monitor for edema in forearm, hand, or finger  Exercises: ? Pendulum exercises o With the arm hanging, the patient gently swings the hand forward and backward, then side-to-side, and then clockwise and counterclockwise ? Passive, supine well-arm assisted forward flexion, in front of the plane of the scapula as pain allows per safe zone above (140/40) ? Active scapular retraction, elevation in sitting or standing ? Active elbow, wrist, hand ROM - Grasping and gripping lightweight objects  ----------------------------------------------------------------------------------------------------------------------  Phase II: Active Range of Motion (6-10 weeks post-op)  Goals: ? Full range of motion by end of week 10. After 6 week physician visit, patient and therapist can move beyond the safe zones as pain allows. Please notify physician if range of motion is not improving by week 10.  ? Emphasis should be on range of motion before strengthening. ? Improve strength, Decrease pain, Increase functional activities, Scapular stabilization Precautions: ? No sling use ? No resisted internal rotation until 10 weeks post-op Teaching: ? Encourage continued stretching at home. Limited only by pain ? Ice after exercise. Exercises: ? Encourage patient to use smooth, natural movement patterns ? Continue to work on Passive ROM as in Phase I ? Begin AROM and AAROM (using a cane), progressively, to full range of motion ? Assisted forward flexion supine using uninvolved arm to assist - progressing to active motion in a reclined position and then to sitting ? Side lying ER against gravity ? Encourage normal scapular mechanics with active motion ? Add Theraband exercises or light dumbbell weights (2lbs) for flexion, extension, external rotation ?  Scapulothoracic strengthening (prone extension, prone T, etc.) ? Aquatic therapy, if available, can begin no earlier than 1 month post op if wound is completely healed.  o Week 1-6: Stay within established safe zone listed above. Passive motion only o Week 6 +: Shoulder fully submerged - slow, active motions for flexion, elevation, ER/IR and horizontal abduction/adduction out to scapular plane, range of motion limited by pain Only.  ----------------------------------------------------------------------------------------------------------------------  Phase III: Final Strengthening - 10+ weeks  Goals: ? If acceptable motion has been achieved (>150 FF, >50 ER, IR T12 or above), then Maximize strength--otherwise continue with stretching program ? Improve neuromuscular control ? Increase functional activities Precautions: ? No sudden, forceful resisted IR (e.g. golfing, wood splitting, swimming) until >3 months post-op Teaching: ? Continue home stretching minimum 1x per day to maintain full range of motion Exercises: ? Continue to increase difficulty of theraband and dumbbell exercises as tolerated ? Increase resistance exercises - must be light enough weight that >20 reps are achieved per set ? Continue aerobic training as tolerated, and modalities as appropriate ? Continue to progress home program  NOTES: 1. With proper exercise, motion, strength, and function continue to improve even after one year. 2. The complication rate after surgery is 5 - 8%. Complications include infection, fracture, heterotopic bone formation, nerve injury, instability, rotator cuff tear, and tuberosity nonunion. Please look for clinical signs, unusual symptoms, or lack of progress with therapy and report those to Dr. Posey Pronto. Prefer more communication than less.  3. The therapy plan above only serves as a guide. Please be aware of specific individualized patient instructions as written on the prescription or  through discussions with the surgeon. 4. Please call Dr. Posey Pronto if you have any specific questions or concerns [phone]. 5. The patient's "Home exercise stretching program" (critical for first 10 weeks) is attached.     AMBULATORY SURGERY  DISCHARGE INSTRUCTIONS   The drugs that you were given will stay in your system until tomorrow so for the next 24 hours you should not:  Drive an automobile Make any legal decisions Drink any alcoholic beverage   You may resume regular meals tomorrow.  Today it is better to start with liquids and gradually work up to solid foods.  You may eat anything you prefer, but it is better to start with liquids, then soup and crackers, and gradually work up to solid foods.   Please notify your doctor immediately if you have any unusual bleeding, trouble breathing, redness and pain at the surgery site, drainage, fever, or pain not relieved by medication.   Your post-operative visit with Dr.                                       is: Date:                        Time:    Please call to schedule your post-operative visit.  Additional Instructions: PLEASE LEAVE GREEN ARMBAND ON FOR 4 DAYS  POLAR CARE INFORMATION  [email or web address]  How to use [place] Cold Therapy System?  YouTube   [email or web address]  OPERATING INSTRUCTIONS  Start the product With dry hands, connect the transformer to the electrical connection located on the top of the cooler. Next, plug the transformer into an appropriate electrical outlet. The unit will automatically start running at this point.  To stop the pump, disconnect electrical power.  Unplug to stop the product when not in use. Unplugging the Polar Care unit turns it off. Always unplug immediately after use. Never leave it plugged in while unattended. Remove pad.    FIRST ADD WATER TO FILL LINE, THEN ICE---Replace ice when existing ice is almost melted  1 Discuss Treatment with  your [place] Practitioner and Use Only as Prescribed 2 Apply Insulation Barrier & Cold Therapy Pad 3 Check for Moisture 4 Inspect Skin Regularly  Tips and Trouble Shooting Usage Tips 1. Use cubed or chunked ice for optimal performance. 2. It is recommended to drain the Pad between uses. To drain the pad, hold the Pad upright with the hose pointed toward the ground. Depress the black plunger and allow water to drain out. 3. You may disconnect the Pad from the unit without removing the pad from the affected area by depressing the silver tabs on the hose coupling and gently pulling the hoses apart. The Pad and unit will seal itself and will not leak. Note: Some dripping during release is normal. 4. DO NOT  RUN PUMP WITHOUT WATER! The pump in this unit is designed to run with water. Running the unit without water will cause permanent damage to the pump. 5. Unplug unit before removing lid.  TROUBLESHOOTING GUIDE Pump not running, Water not flowing to the pad, Pad is not getting cold 1. Make sure the transformer is plugged into the wall outlet. 2. Confirm that the ice and water are filled to the indicated levels. 3. Make sure there are no kinks in the pad. 4. Gently pull on the blue tube to make sure the tube/pad junction is straight. 5. Remove the pad from the treatment site and ll it while the pad is lying at; then reapply. 6. Confirm that the pad couplings are securely attached to the unit. Listen for the double clicks (Figure 1) to confirm the pad couplings are securely attached.  Leaks    Note: Some condensation on the lines, controller, and pads is unavoidable, especially in warmer climates. 1. If using a [place] Polar Care Cold Therapy unit with a detachable Cold Therapy Pad, and a leak exists (other than condensation on the lines) disconnect the pad couplings. Make sure the silver tabs on the couplings are depressed before reconnecting the pad to the pump hose; then confirm both  sides of the coupling are properly clicked in. 2. If the coupling continues to leak or a leak is detected in the pad itself, stop using it and call [facility] at [phone].  Cleaning After use, empty and dry the unit with a soft cloth. Warm water and mild detergent may be used occasionally to clean the pump and tubes.  WARNING: The [place] can be cold enough to cause serious injury, including full skin necrosis. Follow these Operating Instructions, and carefully read the Product Insert (see pouch on side of unit) and the Cold Therapy Pad Fitting Instructions (provided with each Cold Therapy Pad) prior to use.  SHOULDER SLING IMMOBILIZER   VIDEO Slingshot 2 Shoulder Brace Application - YouTube ---[email or web address]  INSTRUCTIONS While supporting the injured arm, slide the forearm into the sling. Wrap the adjustable shoulder strap around the neck and shoulders and attach the strap end to the sling using  the "alligator strap tab."  Adjust the shoulder strap to the required length. Position the shoulder pad behind the neck. To secure the shoulder pad location (optional), pull the shoulder strap away from the shoulder pad, unfold the hook material on the top of the pad, then press the shoulder strap back onto the hook material to secure the pad in place. Attach the closure strap across the open top of the sling. Position the strap so that it holds the arm securely in the sling. Next, attach the thumb strap to the open end of the sling between the thumb and fingers. After sling has been fit, it may be easily removed and reapplied using the quick release buckle on shoulder strap. If a neutral pillow or 15 abduction pillow is included, place the pillow at the waistline. Attach the sling to the pillow, lining up hook material on the pillow with the loop on sling. Adjust the waist strap to fit.  If waist strap is too long, cut it to fit. Use the small piece  of double sided hook material (located on top of the pillow) to secure the strap end. Place the double sided hook material on the inside of the cut strap end and secure it to the waist strap.     If no  pillow is included, attach the waist strap to the sling and adjust to fit.    Washing Instructions: Straps and sling must be removed and cleaned regularly depending on your activity level and perspiration. Hand wash straps and sling in cold water with mild detergent, rinse, air dry        Interscalene Nerve Block with Exparel   For your surgery you have received an Interscalene Nerve Block with Exparel. Nerve Blocks affect many types of nerves, including nerves that control movement, pain and normal sensation.  You may experience feelings such as numbness, tingling, heaviness, weakness or the inability to move your arm or the feeling or sensation that your arm has "fallen asleep". A nerve block with Exparel can last up to 5 days.  Usually the weakness wears off first.  The tingling and heaviness usually wear off next.  Finally you may start to notice pain.  Keep in mind that this may occur in any order.  Once a nerve block starts to wear off it is usually completely gone within 60 minutes. ISNB may cause mild shortness of breath, a hoarse voice, blurry vision, unequal pupils, or drooping of the face on the same side as the nerve block.  These symptoms will usually resolve with the numbness.  Very rarely the procedure itself can cause mild seizures. If needed, your surgeon will give you a prescription for pain medication.  It will take about 60 minutes for the oral pain medication to become fully effective.  So, it is recommended that you start taking this medication before the nerve block first begins to wear off, or when you first begin to feel discomfort. Take your pain medication only as prescribed.  Pain medication can cause sedation and decrease your breathing if you take more than you need for the  level of pain that you have. Nausea is a common side effect of many pain medications.  You may want to eat something before taking your pain medicine to prevent nausea. After an Interscalene nerve block, you cannot feel pain, pressure or extremes in temperature in the effected arm.  Because your arm is numb it is at an increased risk for injury.  To decrease the possibility of injury, please practice the following:  While you are awake change the position of your arm frequently to prevent too much pressure on any one area for prolonged periods of time.  If you have a cast or tight dressing, check the color or your fingers every couple of hours.  Call your surgeon with the appearance of any discoloration (white or blue). If you are given a sling to wear before you go home, please wear it  at all times until the block has completely worn off.  Do not get up at night without your sling. Please contact [facility] or your surgeon if you do not begin to regain sensation after 7 days from the surgery.  Anesthesia may be contacted by calling the Same Day Surgery Department, Mon. through Fri., 6 am to 4 pm at [phone].   If you experience any other problems or concerns, please contact your surgeon's office. If you experience severe or prolonged shortness of breath go to the nearest emergency department.

## 2023-01-05 NOTE — Transfer of Care (Signed)
Immediate Anesthesia Transfer of Care Note  Patient: Eric Andrews  Procedure(s) Performed: Left total shoulder arthroplasty, biceps tenodesis (Left: Shoulder)  Patient Location: PACU  Anesthesia Type:GA combined with regional for post-op pain  Level of Consciousness: drowsy  Airway & Oxygen Therapy: Patient Spontanous Breathing and Patient connected to face mask oxygen  Post-op Assessment: Report given to RN and Post -op Vital signs reviewed and stable  Post vital signs: Reviewed and stable  Last Vitals:  Vitals Value Taken Time  BP 102/65 01/05/23 1519  Temp    Pulse 99 01/05/23 1527  Resp 18 01/05/23 1527  SpO2 98 % 01/05/23 1527  Vitals shown include unvalidated device data.  Last Pain:  Vitals:   01/05/23 1016  TempSrc: Temporal  PainSc: 2          Complications: No notable events documented.

## 2023-01-05 NOTE — Evaluation (Signed)
Occupational Therapy Evaluation Patient Details Name: Eric Andrews MRN: [id number] DOB: 1951/03/25 Today's Date: 01/05/2023   History of Present Illness Pt is a 72 y.o. male s/p L TSA on 01/05/23. PMH includes B LE neuropathy, sleep apnea with CPAP, htn, anxiety, DM, and L TKA on 04/21/22   Clinical Impression   Eric Andrews was seen for OT evaluation this date. Prior to hospital admission, pt was IND. Pt lives with children, wife available to assist. Pt currently requires MAX A don/doff sling, shirt, and polar care sitting - MOD cues for wife to complete. MOD A don pants, assist for pulling up in standing. MIN A + HHA sit<>stand and bed>chair t/f. Pt instructed in polar care mgt, sling/immobilizer mgt, ROM exercises for LUE, LUE precautions, adaptive strategies for dressing/toileting, and falls prevention strategies. Education complete, will sign off. Upon hospital discharge, recommend assist for all ADLs.    Recommendations for follow up therapy are one component of a multi-disciplinary discharge planning process, led by the attending physician.  Recommendations may be updated based on patient status, additional functional criteria and insurance authorization.   Follow Up Recommendations  Follow physician's recommendations for discharge plan and follow up therapies     Assistance Recommended at Discharge Frequent or constant Supervision/Assistance  Patient can return home with the following A little help with walking and/or transfers;A lot of help with bathing/dressing/bathroom;Help with stairs or ramp for entrance    Functional Status Assessment  Patient has had a recent decline in their functional status and demonstrates the ability to make significant improvements in function in a reasonable and predictable amount of time.  Equipment Recommendations  None recommended by OT    Recommendations for Other Services       Precautions / Restrictions Precautions Precautions:  Shoulder;Fall Shoulder Interventions: Shoulder sling/immobilizer;Shoulder abduction pillow;Off for dressing/bathing/exercises Required Braces or Orthoses: Sling Restrictions Weight Bearing Restrictions: Yes LUE Weight Bearing: Non weight bearing      Mobility Bed Mobility Overal bed mobility: Needs Assistance Bed Mobility: Supine to Sit     Supine to sit: Min assist     General bed mobility comments: requires rail or hand to pull up on    Transfers Overall transfer level: Needs assistance Equipment used: 1 person hand held assist Transfers: Sit to/from Stand Sit to Stand: Min assist                  Balance Overall balance assessment: Needs assistance Sitting-balance support: No upper extremity supported, Feet supported Sitting balance-Leahy Scale: Good     Standing balance support: No upper extremity supported Standing balance-Leahy Scale: Fair                             ADL either performed or assessed with clinical judgement   ADL Overall ADL's : Needs assistance/impaired                                       General ADL Comments: MAX A don/doff sling, shirt, and polar care sitting - MOD cues for wife to complete. MOD A don pants, assist for pulling up in standing. MIN A + HHA simulated toilet t/f      Pertinent Vitals/Pain Pain Assessment Pain Assessment: No/denies pain     Hand Dominance Right (reports metal plate in R wrist so uses LUE for toileting)   Extremity/Trunk Assessment  Upper Extremity Assessment Upper Extremity Assessment: LUE deficits/detail LUE: Unable to fully assess due to immobilization   Lower Extremity Assessment Lower Extremity Assessment: Overall WFL for tasks assessed       Communication Communication Communication: No difficulties   Cognition Arousal/Alertness: Awake/alert Behavior During Therapy: WFL for tasks assessed/performed Overall Cognitive Status: Within Functional Limits for  tasks assessed                                 General Comments: expected anesthesia related lethargy however follows all commands      Home Living Family/patient expects to be discharged to:: Private residence Living Arrangements: Children (Pt's 2 daughters, 3 grandchildren, and 1 great grandchild; pt's wife very supportive) Available Help at Discharge: Family Type of Home: House Home Access: Ramped entrance     Home Layout: Two level;Able to live on main level with bedroom/bathroom     Bathroom Shower/Tub: Occupational psychologist: Standard     Home Equipment: Toilet riser;Grab bars - toilet;Grab bars - tub/shower;Shower seat - built in;Cane - single Barista (2 wheels);BSC/3in1;Shower seat;Hand held shower head          Prior Functioning/Environment Prior Level of Function : Independent/Modified Independent             Mobility Comments: no AD use          OT Problem List: Decreased activity tolerance;Impaired balance (sitting and/or standing);Impaired UE functional use         OT Goals(Current goals can be found in the care plan section) Acute Rehab OT Goals Patient Stated Goal: to go home OT Goal Formulation: With patient/family Time For Goal Achievement: 01/19/23 Potential to Achieve Goals: Good   AM-PAC OT "6 Clicks" Daily Activity     Outcome Measure Help from another person eating meals?: None Help from another person taking care of personal grooming?: A Little Help from another person toileting, which includes using toliet, bedpan, or urinal?: A Lot Help from another person bathing (including washing, rinsing, drying)?: A Lot Help from another person to put on and taking off regular upper body clothing?: A Lot Help from another person to put on and taking off regular lower body clothing?: A Lot 6 Click Score: 15   End of Session    Activity Tolerance: Patient tolerated treatment well Patient left: in chair;with  family/visitor present;with nursing/sitter in room  OT Visit Diagnosis: Other abnormalities of gait and mobility (R26.89)                Time: 2703-5009 OT Time Calculation (min): 25 min Charges:  OT General Charges $OT Visit: 1 Visit OT Evaluation $OT Eval Low Complexity: 1 Low OT Treatments $Self Care/Home Management : 8-22 mins  Eric Andrews, M.S. OTR/L  01/05/23, 4:42 PM  ascom [phone]

## 2023-01-05 NOTE — Evaluation (Signed)
Physical Therapy Evaluation Patient Details Name: Eric Andrews MRN: [id number] DOB: 06-25-1951 Today's Date: 01/05/2023  History of Present Illness  Pt is a 72 y.o. male s/p L TSA on 01/05/23. PMH includes B LE neuropathy, sleep apnea with CPAP, htn, anxiety, DM, and L TKA on 04/21/22  Clinical Impression  Pt was able to ambulate ~200 ft with SPC and negotiate up/down 4 steps w/o phyiscal assist. Pt with occasional low grade unsteadiness that he is able to self arrest and he reports as baseline ("from the neuropathy").  Otherwise he showed good confidence with mobility and is safe to return home. Introduced distal UE and proximal PROM exercises, trouble shot with pt and wife about sleeping set up and need for increased assistance with some ADLs, all questions answered.  Pt will need continued PT per total shoulder protocol but showed mobility and safety awareness to d/c home when medically ready.      Recommendations for follow up therapy are one component of a multi-disciplinary discharge planning process, led by the attending physician.  Recommendations may be updated based on patient status, additional functional criteria and insurance authorization.  Follow Up Recommendations Follow physician's recommendations for discharge plan and follow up therapies      Assistance Recommended at Discharge Intermittent Supervision/Assistance  Patient can return home with the following  A little help with bathing/dressing/bathroom;Assistance with cooking/housework;Help with stairs or ramp for entrance;Assist for transportation    Equipment Recommendations None recommended by PT  Recommendations for Other Services       Functional Status Assessment Patient has had a recent decline in their functional status and demonstrates the ability to make significant improvements in function in a reasonable and predictable amount of time.     Precautions / Restrictions Precautions Precautions:  Shoulder;Fall Shoulder Interventions: Shoulder sling/immobilizer;Shoulder abduction pillow;Off for dressing/bathing/exercises Required Braces or Orthoses: Sling Restrictions Weight Bearing Restrictions: Yes LUE Weight Bearing: Non weight bearing      Mobility  Bed Mobility               General bed mobility comments: in recliner pre/post session    Transfers Overall transfer level: Needs assistance Equipment used: Straight cane Transfers: Sit to/from Stand Sit to Stand: Min guard           General transfer comment: Pt able to rise from standing w/o assist, good ability to shift weight forward over BOS    Ambulation/Gait Ambulation/Gait assistance: Modified independent (Device/Increase time) Gait Distance (Feet): 200 Feet Assistive device: Straight cane         General Gait Details: Pt with slow but consistent cadence (reports baseline waddle from his neuropathy).  He was not overly reliant on the cane, but did have a stagger step trying w/o it and AD is recommended at this time (pt/family agree).  He did have some fatigue with HR getting to ~130 with the effort, O2 remains in the high 90s.  Stairs Stairs: Yes Stairs assistance: Supervision Stair Management: One rail Right, Alternating pattern Number of Stairs: 4 General stair comments: Pt was able to negotiate up/down steps safely and with only minimal cuing.  Wheelchair Mobility    Modified Rankin (Stroke Patients Only)       Balance     Sitting balance-Leahy Scale: Good     Standing balance support: Single extremity supported Standing balance-Leahy Scale: Fair  Pertinent Vitals/Pain Pain Assessment Pain Assessment: No/denies pain    Home Living Family/patient expects to be discharged to:: Private residence Living Arrangements: Children (Pt's 2 daughters, 3 grandchildren, and 1 great grandchild; pt's wife very supportive) Available Help at Discharge:  Family Type of Home: House Home Access: Ramped entrance       Home Layout: Two level;Able to live on main level with bedroom/bathroom Home Equipment: Toilet riser;Grab bars - toilet;Grab bars - tub/shower;Shower seat - built in;Cane - single Barista (2 wheels);BSC/3in1;Shower seat;Hand held shower head Additional Comments: Hand held shower head    Prior Function Prior Level of Function : Independent/Modified Independent             Mobility Comments: has but does not typically use AD       Hand Dominance   Dominant Hand: Right    Extremity/Trunk Assessment   Upper Extremity Assessment Upper Extremity Assessment: LUE deficits/detail LUE: Unable to fully assess due to immobilization    Lower Extremity Assessment Lower Extremity Assessment: Overall WFL for tasks assessed (chronic b/l distal neuropathy)       Communication   Communication: No difficulties  Cognition Arousal/Alertness: Awake/alert Behavior During Therapy: WFL for tasks assessed/performed Overall Cognitive Status: Within Functional Limits for tasks assessed                                          General Comments General comments (skin integrity, edema, etc.): Pt did well with POD0 PT exam and gait training, he and wife feeling good about ability to return home    Exercises     Assessment/Plan    PT Assessment Patient needs continued PT services  PT Problem List Decreased strength;Decreased range of motion;Decreased activity tolerance;Decreased balance;Decreased mobility;Decreased knowledge of use of DME;Decreased safety awareness;Decreased knowledge of precautions;Cardiopulmonary status limiting activity;Pain       PT Treatment Interventions DME instruction;Gait training;Functional mobility training;Therapeutic activities;Therapeutic exercise;Balance training;Neuromuscular re-education;Patient/family education    PT Goals (Current goals can be found in the Care  Plan section)  Acute Rehab PT Goals Patient Stated Goal: go home and eat PT Goal Formulation: With patient Time For Goal Achievement: 01/18/23 Potential to Achieve Goals: Good    Frequency 7X/week     Co-evaluation               AM-PAC PT "6 Clicks" Mobility  Outcome Measure Help needed turning from your back to your side while in a flat bed without using bedrails?: A Little Help needed moving from lying on your back to sitting on the side of a flat bed without using bedrails?: A Little Help needed moving to and from a bed to a chair (including a wheelchair)?: None Help needed standing up from a chair using your arms (e.g., wheelchair or bedside chair)?: None Help needed to walk in hospital room?: None Help needed climbing 3-5 steps with a railing? : None 6 Click Score: 22    End of Session Equipment Utilized During Treatment: Gait belt Activity Tolerance: Patient tolerated treatment well Patient left: in chair;with call bell/phone within reach;with family/visitor present;with nursing/sitter in room Nurse Communication: Mobility status PT Visit Diagnosis: Muscle weakness (generalized) (M62.81);Pain;Unsteadiness on feet (R26.81) Pain - Right/Left: Left Pain - part of body: Shoulder    Time: 1245-8099 PT Time Calculation (min) (ACUTE ONLY): 24 min   Charges:   PT Evaluation $PT Eval Low Complexity: 1 Low  PT Treatments $Therapeutic Exercise: 8-22 mins        Kreg Shropshire, DPT 01/05/2023, 5:11 PM

## 2023-01-05 NOTE — Anesthesia Preprocedure Evaluation (Signed)
Anesthesia Evaluation  Patient identified by MRN, date of birth, ID band Patient awake    Reviewed: Allergy & Precautions, NPO status , Patient's Chart, lab work & pertinent test results  Airway Mallampati: III  TM Distance: <3 FB Neck ROM: full    Dental  (+) Chipped, Poor Dentition, Missing   Pulmonary neg shortness of breath, sleep apnea and Continuous Positive Airway Pressure Ventilation    Pulmonary exam normal        Cardiovascular Exercise Tolerance: Good hypertension, (-) angina (-) Past MI and (-) DOE Normal cardiovascular exam     Neuro/Psych  PSYCHIATRIC DISORDERS       Neuromuscular disease    GI/Hepatic Neg liver ROS,GERD  Controlled,,  Endo/Other  diabetes, Type 2    Renal/GU      Musculoskeletal   Abdominal   Peds  Hematology negative hematology ROS (+)   Anesthesia Other Findings Past Medical History: No date: ADD (attention deficit disorder) No date: Anemia No date: Anxiety No date: Arthritis No date: Depression No date: Diabetes mellitus without complication (HCC) No date: Enlarged prostate No date: GERD (gastroesophageal reflux disease) No date: History of kidney stones No date: Hypertension No date: Knee pain No date: Neuromuscular disorder (HCC)     Comment:  lower legs- neuropathy No date: Sleep apnea  Past Surgical History: No date: APPENDECTOMY No date: CARPEL TUNEL; Bilateral No date: COLONOSCOPY W/ POLYPECTOMY No date: HERNIA REPAIR     Comment:  Left No date: KNEE ARTHROSCOPY     Comment:  Left No date: RIGHT WRIST SX X2 04/21/2022: TOTAL KNEE ARTHROPLASTY; Left     Comment:  Procedure: TOTAL KNEE ARTHROPLASTY;  Surgeon: Hessie Knows, MD;  Location: [facility] ORS;  Service: Orthopedics;               Laterality: Left; No date: vascetomy     Reproductive/Obstetrics negative OB ROS                             Anesthesia  Physical Anesthesia Plan  ASA: 3  Anesthesia Plan: General ETT   Post-op Pain Management: Regional block*   Induction: Intravenous  PONV Risk Score and Plan: Ondansetron, Dexamethasone, Midazolam and Treatment may vary due to age or medical condition  Airway Management Planned: Oral ETT  Additional Equipment:   Intra-op Plan:   Post-operative Plan: Extubation in OR  Informed Consent: I have reviewed the patients History and Physical, chart, labs and discussed the procedure including the risks, benefits and alternatives for the proposed anesthesia with the patient or authorized representative who has indicated his/her understanding and acceptance.     Dental Advisory Given  Plan Discussed with: Anesthesiologist, CRNA and Surgeon  Anesthesia Plan Comments: (Patient consented for risks of anesthesia including but not limited to:  - adverse reactions to medications - damage to eyes, teeth, lips or other oral mucosa - nerve damage due to positioning  - sore throat or hoarseness - Damage to heart, brain, nerves, lungs, other parts of body or loss of life  Patient voiced understanding.)       Anesthesia Quick Evaluation

## 2023-01-05 NOTE — H&P (Signed)
Paper H&P to be scanned into permanent record. H&P reviewed. No significant changes noted.  

## 2023-01-05 NOTE — Op Note (Signed)
Operative Note    SURGERY DATE: 01/05/2023   PRE-OP DIAGNOSIS:  1. Left shoulder osteoarthritis 2. Left biceps tendinopathy 3. Left shoulder loose bodies   POST-OP DIAGNOSIS:  1. Left shoulder osteoarthritis 2. Left biceps tendinopathy 3. Left shoulder loose bodies   PROCEDURES:  1. Left total shoulder arthroplasty 2. Left biceps tenodesis 3. Left shoulder loose body removal   SURGEON: Cato Mulligan, MD  ASSISTANTS: Reche Dixon, PA; Linward Natal, PA-S    ANESTHESIA: Gen + Exparel interscalene block   ESTIMATED BLOOD LOSS: 400cc   TOTAL IV FLUIDS: per anesthesia  IMPLANTS: Tornier Aequalis:  - Perform Cortiloc Pegged Glenoid E95  - Simplicity - Size 3 Nucleus - Humeral Head 52x19 mm   INDICATION(S):  Eric Andrews is a 72 y.o. male with chronic shoulder pain. There is clinical and radiographic evidence for the diagnosis of shoulder arthritis. Extensive conservative measures have not provided adequate relief. After discussion of risks, benefits, and alternatives to surgery, the patient elected to proceed.    OPERATIVE FINDINGS: end-stage glenohumeral arthritis, biceps tendinopathy, multiple loose bodies     OPERATIVE REPORT:   I identified Eric Andrews  in the pre-operative holding area. Informed consent was obtained and the surgical site was marked. I reviewed the risks and benefits of the proposed surgical intervention and the patient wished to proceed. An interscalene block with Exparel was administered by the Anesthesia team. The patient was transferred to the operative suite and general anesthesia was administered. The patient was placed in the beach chair position with the head of the bed elevated approximately 45 degrees. All down side pressure points were appropriately padded. Pre-op exam under anesthesia confirmed some stiffness and crepitus. Appropriate IV antibiotics were administered within 30 minutes before incision. Tranexamic acid was also administered after  verifying that the patient had no contraindications. The extremity was then prepped and draped in standard fashion. A time out was performed confirming the correct extremity, correct patient, and correct procedure.   We used the standard deltopectoral incision from the coracoid to ~12cm distal. We found the cephalic vein and took it laterally. We opened the deltopectoral interval widely and placed retractors under the CA ligament in the subacromial space and under the deltoid tendon at its insertion. We then abducted and internally rotated the arm and released the underlying bursa between these retractors, taking care not to damage the circumflex branch of the axillary nerve.   Next, we brought the arm back in adduction at slight forward flexion with external rotation. We opened the clavipectoral fascia lateral to the conjoint tendon. We gently palpated the axillary nerve and verified its position and continuity on both sides of the humerus with a Tug test. Note, this test was repeated multiple times during the procedure for nerve localization and confirmed to be intact at the end of the case. We then cauterized the anterior humeral circumflex ("Three sisters") vessels. The arm was then internally rotated, we cut the falciform ligament at approximately 1 cm of the upper portion of the pectoralis major insertion. Next we unroofed the bicipital groove. The biceps tendon was tendinopathic with inflammatory synovium surrounding.  At this point, multiple loose bodies were noted within the joint.  The largest loose body measured approximately 15 x 10 mm.  We then proceeded with a soft tissue biceps tenodesis given the pathology of the tendon.  After opening the biceps tendon sheath all the way to the supraglenoid tubercle, we performed a biceps tenodesis with two #2 TiCron sutures to  the upper boarder of the pectoralis major. The proximal portion of the tendon was excised.   At this point we visualized the rotator  cuff, and it was intact.  Additionally, there was increased bleeding noted at this point and the cephalic vein was noted to be nicked in multiple places from the retractors.  Therefore the cephalic vein was ligated.  Next, we performed a lesser tuberosity osteotomy.  This produced a small wafer of bone so the remainder of the exposure was performed with a subscapularis peel. The subscapularis was dissected off of the underlying capsule, and we then released the inferior capsule from the humerus all the way to the posterior band of the inferior glenohumeral ligament. When this was complete we gently dislocated the shoulder up into the wound. We removed the osteophytes and made an appropriate humeral head cut.    We then turned our attention back to the glenoid. A posterior retractor was used to retract the proximal humerus posteriorly. The remnant of the anterior capsule was excised, exposing the anterior glenoid. We then grasped the remaining stump of the biceps and removed the labrum circumferentially. During the glenoid exposure, the axillary nerve was protected the entire time. Capsular release was extended from the glenoid to past the posterior/inferior quadrant.    A patient-specific guide was used to drill the central guidepin. An appropriately sized reamer was used to ream the glenoid. Peripheral and central holes were drilled. The trial glenoid seated well. Bony endpoint of peripheral holes was verified. We used thrombin soaked surgicell for hemostasis in the peripheral holes. Pressurized cement was used to fill the peripheral holes. The glenoid component was placed and it was well seated without any motion afterwards.   Attention was then turned back to the humeral head preparation.  Guide pin was placed, and appropriate reaming was performed. Trial nucleus and cut protector were placed.The humerus was trialed with the above components and noted to have satisfactory stability, motion, and  subscapularis tension. The wound and humeral canal were thoroughly irrigated with pulse lavage. Three drill holes for subscapular repair were place just lateral to the bicipital groove and #2 FiberWire sutures were passed through for later repair. The final humeral component was then inserted with one #2 FiberWire suture placed around the implant. Excellent stability was achieved after final impaction of the implant. The lesser tuberosity osteotomy/subscapularis was then repaired by passing the 3 sutures of #2 FiberWire through the subscapularis. The suture around the implant was passed just medial to these sutures in a mattress fashion.  Sutures were tied with the arm in neutral rotation.  Excellent fixation of the subscapularis was achieved.  One suture of #2 TiCron was used to repair the lateral part of the rotator interval.  We again verified the tension on the axillary nerve, appropriate range of motion, stability of the implant, and security of the subscapularis repair. We closed the deltopectoral interval deep to the cephalic vein with a running, 0-Vicryl suture. The skin was closed with 2-0 Vicryl and staples. Xeroform and Honeycomb dressing was applied. Patient was extubated, transferred to the post anesthesia care unit in stable condition.   Of note, assistance from a PA was essential to performing the surgery.  PA was present for the entire surgery.  PA assisted with patient positioning, retraction, instrumentation, and wound closure. The surgery would have been more difficult and had longer operative time without PA assistance.   POSTOPERATIVE PLAN: Plan will be for patient to be discharged home later today.  Operative arm to remain in sling at all times except RoM exercises and hygiene. Can perform pendulums, elbow/wrist/hand RoM exercises. Passive RoM allowed to 130 FF and 30 ER. ASA '325mg'$  x 6 weeks for DVT ppx. Plan for outpatient PT starting on POD #3-4. Patient to return to clinic in ~2 weeks  for post-operative appointment.

## 2023-01-05 NOTE — Anesthesia Procedure Notes (Signed)
Anesthesia Regional Block: Interscalene brachial plexus block   Pre-Anesthetic Checklist: , timeout performed,  Correct Patient, Correct Site, Correct Laterality,  Correct Procedure, Correct Position, site marked,  Risks and benefits discussed,  Surgical consent,  Pre-op evaluation,  At surgeon's request and post-op pain management  Laterality: Upper and Left  Prep: chloraprep       Needles:  Injection technique: Single-shot  Needle Type: Stimiplex     Needle Length: 9cm  Needle Gauge: 22     Additional Needles:   Procedures:,,,, ultrasound used (permanent image in chart),,    Narrative:  Start time: 01/05/2023 11:00 AM End time: 01/05/2023 11:03 AM Injection made incrementally with aspirations every 5 mL.  Performed by: Personally  Anesthesiologist: Deontez Klinke, Precious Haws, MD  Additional Notes: Patient consented for risk and benefits of nerve block including but not limited to nerve damage, Horner's syndrome, failed block, bleeding and infection.  Patient voiced understanding.  Functioning IV was confirmed and monitors were applied.  Timeout done prior to procedure and prior to any sedation being given to the patient.  Patient confirmed procedure site prior to any sedation given to the patient.  A 27m 22ga Stimuplex needle was used. Sterile prep,hand hygiene and sterile gloves were used.  Minimal sedation used for procedure.  No paresthesia endorsed by patient during the procedure.  Negative aspiration and negative test dose prior to incremental administration of local anesthetic. The patient tolerated the procedure well with no immediate complications.

## 2023-01-06 NOTE — Anesthesia Postprocedure Evaluation (Signed)
Anesthesia Post Note  Patient: Eric Andrews  Procedure(s) Performed: Left total shoulder arthroplasty, biceps tenodesis (Left: Shoulder)  Patient location during evaluation: PACU Anesthesia Type: General Level of consciousness: awake and alert Pain management: pain level controlled Vital Signs Assessment: post-procedure vital signs reviewed and stable Respiratory status: spontaneous breathing, nonlabored ventilation, respiratory function stable and patient connected to nasal cannula oxygen Cardiovascular status: blood pressure returned to baseline and stable Postop Assessment: no apparent nausea or vomiting Anesthetic complications: no   No notable events documented.   Last Vitals:  Vitals:   01/05/23 1610 01/05/23 1628  BP: 107/68 106/62  Pulse:    Resp: 13 16  Temp:  (!) 36.3 C  SpO2:  95%    Last Pain:  Vitals:   01/05/23 1628  TempSrc:   PainSc: 0-No pain                 Precious Haws Alyzae Hawkey

## 2023-01-07 ENCOUNTER — Encounter: Payer: Self-pay | Admitting: Orthopedic Surgery

## 2023-01-07 ENCOUNTER — Other Ambulatory Visit: Payer: Self-pay | Admitting: Dermatology

## 2023-01-07 DIAGNOSIS — L219 Seborrheic dermatitis, unspecified: Secondary | ICD-10-CM

## 2023-01-07 LAB — SURGICAL PATHOLOGY

## 2023-01-08 DIAGNOSIS — M19019 Primary osteoarthritis, unspecified shoulder: Secondary | ICD-10-CM | POA: Diagnosis not present

## 2023-01-15 DIAGNOSIS — M19019 Primary osteoarthritis, unspecified shoulder: Secondary | ICD-10-CM | POA: Diagnosis not present

## 2023-01-20 DIAGNOSIS — Z96612 Presence of left artificial shoulder joint: Secondary | ICD-10-CM | POA: Insufficient documentation

## 2023-01-20 DIAGNOSIS — M19012 Primary osteoarthritis, left shoulder: Secondary | ICD-10-CM | POA: Insufficient documentation

## 2023-01-20 DIAGNOSIS — M19019 Primary osteoarthritis, unspecified shoulder: Secondary | ICD-10-CM | POA: Diagnosis not present

## 2023-01-28 DIAGNOSIS — M19019 Primary osteoarthritis, unspecified shoulder: Secondary | ICD-10-CM | POA: Diagnosis not present

## 2023-02-09 DIAGNOSIS — M19019 Primary osteoarthritis, unspecified shoulder: Secondary | ICD-10-CM | POA: Diagnosis not present

## 2023-02-14 ENCOUNTER — Other Ambulatory Visit: Payer: Self-pay | Admitting: Family Medicine

## 2023-02-14 DIAGNOSIS — M159 Polyosteoarthritis, unspecified: Secondary | ICD-10-CM

## 2023-02-14 DIAGNOSIS — M5136 Other intervertebral disc degeneration, lumbar region: Secondary | ICD-10-CM

## 2023-02-15 NOTE — Telephone Encounter (Signed)
Refused Celebrex 200 mg because it was discontinued 01/05/2023.

## 2023-02-16 DIAGNOSIS — G4733 Obstructive sleep apnea (adult) (pediatric): Secondary | ICD-10-CM | POA: Diagnosis not present

## 2023-02-17 DIAGNOSIS — M19019 Primary osteoarthritis, unspecified shoulder: Secondary | ICD-10-CM | POA: Diagnosis not present

## 2023-02-17 DIAGNOSIS — Z96612 Presence of left artificial shoulder joint: Secondary | ICD-10-CM | POA: Diagnosis not present

## 2023-02-19 ENCOUNTER — Ambulatory Visit (INDEPENDENT_AMBULATORY_CARE_PROVIDER_SITE_OTHER): Payer: Medicare Other | Admitting: Family Medicine

## 2023-02-19 ENCOUNTER — Encounter: Payer: Self-pay | Admitting: Family Medicine

## 2023-02-19 VITALS — BP 122/70 | HR 77 | Temp 96.8°F | Wt 307.0 lb

## 2023-02-19 DIAGNOSIS — R140 Abdominal distension (gaseous): Secondary | ICD-10-CM

## 2023-02-19 DIAGNOSIS — Z1211 Encounter for screening for malignant neoplasm of colon: Secondary | ICD-10-CM

## 2023-02-19 NOTE — Progress Notes (Signed)
Subjective:    Patient ID: Eric Andrews, male    DOB: 02-Apr-1951, 72 y.o.   MRN: [id number]  Eric Andrews is a 72 y.o. male presenting on 02/19/2023 for Gastroesophageal Reflux   HPI  GERD Gas / Bloating / Indigestion  Reports 1 month Increased gurgling gas bloating he has tried probiotic Admits burping reflux / acid Tried antacid, tried Gas-X - some relief Eating can create the gas He does consume Black coffee, avoiding creamer due to lactose intolerance If eats anything even if bland it can cause  BM softer yellow appear and sometimes brown No blood in stool No nausea or vomiting  Taking Omeprazole 20mg  daily as of now. Has not tried higher dose or split dosing  Additional medications w/ side effects taking Metformin and Ozempic      11/23/2022   11:45 AM 08/11/2022    3:21 PM 07/27/2022    1:35 PM  Depression screen PHQ 2/9  Decreased Interest 2 0 0  Down, Depressed, Hopeless 1 1 1   PHQ - 2 Score 3 1 1   Altered sleeping 1 2 3   Tired, decreased energy 1 2 0  Change in appetite 3 3 0  Feeling bad or failure about yourself  1 1 3   Trouble concentrating 3 1 3   Moving slowly or fidgety/restless 1 0 0  Suicidal thoughts 0 0 0  PHQ-9 Score 13 10 10   Difficult doing work/chores Somewhat difficult Not difficult at all Not difficult at all    Social History   Tobacco Use   Smoking status: Never   Smokeless tobacco: Never  Vaping Use   Vaping Use: Never used  Substance Use Topics   Alcohol use: Not Currently   Drug use: Never    Review of Systems Per HPI unless specifically indicated above     Objective:    BP 122/70 (BP Location: Left Arm, Patient Position: Sitting, Cuff Size: Large)   Pulse 77   Temp (!) 96.8 F (36 C) (Temporal)   Wt (!) 307 lb (139.3 kg)   SpO2 100%   BMI 44.05 kg/m   Wt Readings from Last 3 Encounters:  02/19/23 (!) 307 lb (139.3 kg)  01/05/23 (!) 305 lb 1.9 oz (138.4 kg)  12/22/22 (!) 305 lb 1.6 oz (138.4 kg)    Physical  Exam Vitals and nursing note reviewed.  Constitutional:      General: He is not in acute distress.    Appearance: Normal appearance. He is well-developed. He is not diaphoretic.     Comments: Well-appearing, comfortable, cooperative  HENT:     Head: Normocephalic and atraumatic.  Eyes:     General:        Right eye: No discharge.        Left eye: No discharge.     Conjunctiva/sclera: Conjunctivae normal.  Cardiovascular:     Rate and Rhythm: Normal rate.  Pulmonary:     Effort: Pulmonary effort is normal.  Skin:    General: Skin is warm and dry.     Findings: No erythema or rash.  Neurological:     Mental Status: He is alert and oriented to person, place, and time.  Psychiatric:        Mood and Affect: Mood normal.        Behavior: Behavior normal.        Thought Content: Thought content normal.     Comments: Well groomed, good eye contact, normal speech and thoughts    Results for  orders placed or performed during the hospital encounter of 01/05/23  Glucose, capillary  Result Value Ref Range   Glucose-Capillary 172 (H) 70 - 99 mg/dL   Comment 1 Notify RN    Comment 2 Document in Chart   Glucose, capillary  Result Value Ref Range   Glucose-Capillary 214 (H) 70 - 99 mg/dL  Surgical pathology  Result Value Ref Range   SURGICAL PATHOLOGY      SURGICAL PATHOLOGY CASE: [phone] [phone] PATIENT: Jorgen Kaczmarek Surgical Pathology Report     Specimen Submitted: A. Humeral head, left  Clinical History: Osteoarthritis of glenohumeral joint, left M19.012, left shoulder pain, unspecified chronicity M25.512    DIAGNOSIS: A. HUMERAL HEAD, LEFT; ARTHROPLASTY: - DEGENERATIVE JOINT DISEASE. - NEGATIVE FOR MALIGNANCY.  GROSS DESCRIPTION: A. Labeled: Left humeral head Received: Fresh Collection time: 1:53 PM on 01/05/2023 Placed into formalin time: 3:22 PM on 01/05/2023 Size of specimen:      Head -5.4 x 5 x 1.8 cm      Neck -none grossly appreciated      Additional tissue:  4.2 x 3.2 x 1.2 cm Articular surface: There is a scant peripheral finely granular rim of cartilage with 2 areas of eburnation, ranging from 1.4 to 4 cm in greatest dimension. Cut surface: The cut surface is yellow-tan and firm with areas of dense red discoloration underlying the eburnation and at least one, 0.4 x 0.3 x 0.3 cm, cystlike area wh ich contains gelatinous red material. Other findings: Additionally received in the same container are multiple fragments of tan firm bone.  Block summary: 1 - representative sections of eburnation to adjacent cartilage with underlying bone, discoloration and cystlike area  Tissue decalcification: Cassette 1  RB 01/06/2023  Final Diagnosis performed by Allena Napoleon, MD.   Electronically signed 01/07/2023 10:45:21AM The electronic signature indicates that the named Attending Pathologist has evaluated the specimen Technical component performed at [facility], [address] [place], [place] [address] Lab: [phone] Dir: Rush Farmer, MD, MMM  Professional component performed at [facility], [facility], [place], [place], [place] [address] Lab: [phone] Dir: Kathi Simpers, MD       Assessment & Plan:   Problem List Items Addressed This Visit   None Visit Diagnoses     Screening for colon cancer    -  Primary   Relevant Orders   Ambulatory referral to Gastroenterology   Abdominal bloating           Seems functional GI symptoms Underlying GERD / indigestion Also Likely some med side effect - Metformin and Ozempic are top 2 medications to increase gas bloating and symptoms.  1st try to reduce Metformin XR from 500mg  x 2 twice a day, down to 500mg  x 1 twice a day. If resolves symptoms, then may  have been GI side effect  2nd Next try the stomach acid med Omeprazole try to take it TWICE a day before meals Use existing 20mg  twice per day If that does not help after 1-2 weeks, you can try STACKING dose with 20mg  x  2 = 40mg  before dinner. If that doesn't work then, we may need to consult the GI Gastro specialist  Will pursue referral to GI for colonoscopy  [facility]) [place], [place] [address] Phone: [phone]   OTC Peppermint Oil (Triple Coated Capsule) 180mg  take one 3 times daily to reduce gas bloating symptoms  Orders Placed This Encounter  Procedures   Ambulatory referral to  Gastroenterology    Referral Priority:   Routine    Referral Type:   Consultation    Referral Reason:   Specialty Services Required    Number of Visits Requested:   1     No orders of the defined types were placed in this encounter.     Follow up plan: Return if symptoms worsen or fail to improve.   Nobie Putnam, [facility] 02/19/2023, 4:29 PM

## 2023-02-19 NOTE — Patient Instructions (Addendum)
Thank you for coming to the office today.  First  Likely some med side effect - Metformin and Ozempic are top 2 medications to increase gas bloating and symptoms.  Also try to reduce Metformin XR from 500mg  x 2 twice a day, down to 500mg  x 1 twice a day.  Next try the stomach acid med Omeprazole try to take it TWICE a day before meals  20mg  twice.  If that does not help after 1-2 weeks, you can try STACKING dose with 20mg  x 2 = 40mg  before dinner.  If that doesn't work then, we may need to consult the GI Gastro specialist  We can refer if you are interested  [facility]) [place], [place] [address] Phone: [phone]   OTC Peppermint Oil (Triple Coated Capsule) 180mg  take one 3 times daily to reduce gas bloating symptoms     Please schedule a Follow-up Appointment to: Return if symptoms worsen or fail to improve.  If you have any other questions or concerns, please feel free to call the office or send a message through [place]. You may also schedule an earlier appointment if necessary.  Additionally, you may be receiving a survey about your experience at our office within a few days to 1 week by e-mail or mail. We value your feedback.  Nobie Putnam, DO [place]

## 2023-02-23 DIAGNOSIS — M19019 Primary osteoarthritis, unspecified shoulder: Secondary | ICD-10-CM | POA: Diagnosis not present

## 2023-02-24 DIAGNOSIS — G4733 Obstructive sleep apnea (adult) (pediatric): Secondary | ICD-10-CM | POA: Diagnosis not present

## 2023-02-24 DIAGNOSIS — R200 Anesthesia of skin: Secondary | ICD-10-CM | POA: Diagnosis not present

## 2023-02-24 DIAGNOSIS — M542 Cervicalgia: Secondary | ICD-10-CM | POA: Diagnosis not present

## 2023-02-24 DIAGNOSIS — M79602 Pain in left arm: Secondary | ICD-10-CM | POA: Diagnosis not present

## 2023-02-24 DIAGNOSIS — R2689 Other abnormalities of gait and mobility: Secondary | ICD-10-CM | POA: Diagnosis not present

## 2023-02-24 DIAGNOSIS — R2681 Unsteadiness on feet: Secondary | ICD-10-CM | POA: Diagnosis not present

## 2023-02-24 DIAGNOSIS — G479 Sleep disorder, unspecified: Secondary | ICD-10-CM | POA: Diagnosis not present

## 2023-02-25 ENCOUNTER — Other Ambulatory Visit: Payer: Self-pay

## 2023-02-25 ENCOUNTER — Telehealth: Payer: Self-pay

## 2023-02-25 DIAGNOSIS — Z86010 Personal history of colonic polyps: Secondary | ICD-10-CM

## 2023-02-25 MED ORDER — NA SULFATE-K SULFATE-MG SULF 17.5-3.13-1.6 GM/177ML PO SOLN: 1.0000 | Freq: Once | ORAL | 0 refills | Status: AC

## 2023-02-25 NOTE — Telephone Encounter (Signed)
Gastroenterology Pre-Procedure Review  Request Date: 03/22/23 Requesting Physician: Dr. Vicente Males  PATIENT REVIEW QUESTIONS: The patient responded to the following health history questions as indicated:    1. Are you having any GI issues? no 2. Do you have a personal history of Polyps? yes (last colonoscopy 02/09/18 performed by Dr. Truddie Crumble at [facility] procedure note recommended repeat in 5 years ) 3. Do you have a family history of Colon Cancer or Polyps? no 4. Diabetes Mellitus? yes (patient has been advised to stop Ozempic Inj on 03/15/23, stop [place] on 03/19/23, Stop Metformin on 03/20/23) 5. Joint replacements in the past 12 months?yes (patient stated he had knee replacement May 2023, shoulder replacement Jan 2024 and he was advised by his surgeon to take amoxicillin prior to any procedures.  He was advised to take it as his ortho advised however take it with just a sip of water) 6. Major health problems in the past 3 months?no 7. Any artificial heart valves, MVP, or defibrillator?no    MEDICATIONS & ALLERGIES:    Patient reports the following regarding taking any anticoagulation/antiplatelet therapy:   Plavix, Coumadin, Eliquis, Xarelto, Lovenox, Pradaxa, Brilinta, or Effient? no Aspirin? no  Patient confirms/reports the following medications:  Current Outpatient Medications  Medication Sig Dispense Refill   acetaminophen (TYLENOL) 500 MG tablet Take 2 tablets (1,000 mg total) by mouth every 8 (eight) hours. 90 tablet 2   albuterol (VENTOLIN HFA) 108 (90 Base) MCG/ACT inhaler Inhale 2 puffs into the lungs every 6 (six) hours as needed for wheezing or shortness of breath. (Patient not taking: Reported on 02/19/2023) 8 g 0   Ascorbic Acid (VITAMIN C) 1000 MG tablet Take 1,000 mg by mouth daily.     atorvastatin (LIPITOR) 20 MG tablet Take 1 tablet (20 mg total) by mouth at bedtime. 90 tablet 3   Bacillus Coagulans-Inulin (PROBIOTIC) 1-250 BILLION-MG CAPS Take 1 capsule by  mouth daily. 30 capsule 1   busPIRone (BUSPAR) 7.5 MG tablet Take 7.5 mg by mouth 2 (two) times daily.     cetirizine (ZYRTEC) 10 MG tablet TAKE 1 TABLET(10 MG) BY MOUTH AT BEDTIME 90 tablet 3   chlorthalidone (HYGROTON) 25 MG tablet Take 1 tablet (25 mg total) by mouth daily. 90 tablet 3   cholecalciferol (VITAMIN D3) 25 MCG (1000 UNIT) tablet Take 1,000 Units by mouth in the morning and at bedtime.     FARXIGA 10 MG TABS tablet Take 1 tablet (10 mg total) by mouth daily before breakfast. 90 tablet 0   fenofibrate micronized (LOFIBRA) 134 MG capsule Take 1 capsule (134 mg total) by mouth daily before breakfast. TAKE 1 CAPSULE(134 MG) BY MOUTH DAILY BEFORE BREAKFAST 90 capsule 3   finasteride (PROSCAR) 5 MG tablet TAKE 1 TABLET(5 MG) BY MOUTH EVERY EVENING 90 tablet 3   fluticasone (FLONASE) 50 MCG/ACT nasal spray Place 2 sprays into both nostrils daily. Use for 4-6 weeks then stop and use seasonally or as needed. 16 g 3   gabapentin (NEURONTIN) 600 MG tablet Take 600 mg by mouth in the morning, at noon, in the evening, and at bedtime.     hydrocortisone 2.5 % cream APPLY TOPICALLY TO THE AFFECTED AREA OF FACE AND EARS 3 days a week Monday, Wednesday, Friday 30 g 2   insulin degludec (TRESIBA FLEXTOUCH) 100 UNIT/ML FlexTouch Pen Inject 68 Units into the skin at bedtime. (Patient taking differently: Inject 60 Units into the skin every evening.) 21 mL 5   Insulin Pen Needle 31G  X 5 MM MISC Use with insulin to inject into skin 3 times daily as directed 400 each 3   ketoconazole (NIZORAL) 2 % cream APPLY EXTERNALLY TO THE AFFECTED AREA OF FACE AND EARS 3 days a week on TUESDAY, THURSDAY, AND SATURDAY 30 g 2   losartan (COZAAR) 50 MG tablet Take 1 tablet (50 mg total) by mouth daily. 90 tablet 3   metFORMIN (GLUCOPHAGE-XR) 500 MG 24 hr tablet Take 2 tablets (1,000 mg total) by mouth 2 (two) times daily with a meal. Add refills for future 360 tablet 3   Multiple Vitamin (MULTIVITAMIN) capsule Take 1  capsule by mouth daily.     NOVOLOG FLEXPEN 100 UNIT/ML FlexPen Inject 14 Units into the skin 3 (three) times daily with meals. Sliding scale 15 mL 11   Omega-3 Fatty Acids (FISH OIL) 1200 MG CAPS Take 1,200 mg by mouth in the morning and at bedtime.     omeprazole (PRILOSEC) 20 MG capsule TAKE ONE CAPSULE BY MOUTH DAILY BEFORE BREAKFAST 90 capsule 3   ondansetron (ZOFRAN-ODT) 4 MG disintegrating tablet Take 1 tablet (4 mg total) by mouth every 8 (eight) hours as needed for nausea or vomiting. 20 tablet 0   OZEMPIC, 1 MG/DOSE, 4 MG/3ML SOPN Inject 1 mg into the skin once a week. 12 mL 3   pioglitazone (ACTOS) 15 MG tablet Take 1 tablet (15 mg total) by mouth daily. OFFICE VISIT NEEDED FOR ADDITIONAL REFILLS, CALL OFFICE TO SCHEDULE 90 tablet 0   tamsulosin (FLOMAX) 0.4 MG CAPS capsule TAKE 1 CAPSULE BY MOUTH EVERY NIGHT AT BEDTIME FOR URINE RETENTION 90 capsule 3   traZODone (DESYREL) 100 MG tablet Take 1.5 tablets (150 mg total) by mouth at bedtime. (Patient taking differently: Take 200 mg by mouth at bedtime.) 90 tablet 3   venlafaxine XR (EFFEXOR-XR) 150 MG 24 hr capsule Take 1 capsule (150 mg total) by mouth daily with breakfast. 90 capsule 3   No current facility-administered medications for this visit.    Patient confirms/reports the following allergies:  Allergies  Allergen Reactions   Dust Mite Extract     No orders of the defined types were placed in this encounter.   AUTHORIZATION INFORMATION Primary Insurance: 1D#: Group #:  Secondary Insurance: 1D#: Group #:  SCHEDULE INFORMATION: Date: 03/22/23 Time: Location: [facility]

## 2023-02-26 DIAGNOSIS — M19019 Primary osteoarthritis, unspecified shoulder: Secondary | ICD-10-CM | POA: Diagnosis not present

## 2023-03-01 ENCOUNTER — Telehealth: Payer: Self-pay | Admitting: Family Medicine

## 2023-03-01 DIAGNOSIS — M19019 Primary osteoarthritis, unspecified shoulder: Secondary | ICD-10-CM | POA: Diagnosis not present

## 2023-03-01 DIAGNOSIS — M5136 Other intervertebral disc degeneration, lumbar region: Secondary | ICD-10-CM

## 2023-03-01 MED ORDER — CELECOXIB 200 MG PO CAPS: 200.0000 mg | ORAL_CAPSULE | Freq: Two times a day (BID) | ORAL | 1 refills | Status: DC

## 2023-03-01 NOTE — Telephone Encounter (Signed)
Copied from [place] [id number]. Topic: General - Other >> Mar 01, 2023  9:16 AM Everette C wrote: Reason for CRM: Medication Refill - Medication: celecoxib (CELEBREX) 200 MG capsule [id number]  Has the patient contacted their pharmacy? Yes.   (Agent: If no, request that the patient contact the pharmacy for the refill. If patient does not wish to contact the pharmacy document the reason why and proceed with request.) (Agent: If yes, when and what did the pharmacy advise?)  Preferred Pharmacy (with phone number or street name): [facility] [place], [place] - [address] [place] [address] Phone: [phone] Fax: [phone] Hours: Not open 24 hours   Has the patient been seen for an appointment in the last year OR does the patient have an upcoming appointment? Yes.    Agent: Please be advised that RX refills may take up to 3 business days. We ask that you follow-up with your pharmacy.

## 2023-03-04 DIAGNOSIS — M19019 Primary osteoarthritis, unspecified shoulder: Secondary | ICD-10-CM | POA: Diagnosis not present

## 2023-03-09 DIAGNOSIS — M19019 Primary osteoarthritis, unspecified shoulder: Secondary | ICD-10-CM | POA: Diagnosis not present

## 2023-03-11 DIAGNOSIS — M19019 Primary osteoarthritis, unspecified shoulder: Secondary | ICD-10-CM | POA: Diagnosis not present

## 2023-03-15 ENCOUNTER — Encounter: Payer: Self-pay | Admitting: Gastroenterology

## 2023-03-18 DIAGNOSIS — M19019 Primary osteoarthritis, unspecified shoulder: Secondary | ICD-10-CM | POA: Diagnosis not present

## 2023-03-19 ENCOUNTER — Encounter: Payer: Self-pay | Admitting: Gastroenterology

## 2023-03-22 ENCOUNTER — Encounter
Admission: RE | Disposition: A | Discharge status: Home / Self Care | Payer: Self-pay | Source: Home / Self Care | Attending: Gastroenterology

## 2023-03-22 ENCOUNTER — Ambulatory Visit: Payer: Medicare Other | Admitting: Certified Registered"

## 2023-03-22 ENCOUNTER — Encounter: Payer: Self-pay | Admitting: Gastroenterology

## 2023-03-22 ENCOUNTER — Ambulatory Visit
Admission: RE | Admit: 2023-03-22 | Discharge: 2023-03-22 | Disposition: A | Discharge status: Home / Self Care | Payer: Medicare Other | Attending: Gastroenterology | Admitting: Gastroenterology

## 2023-03-22 ENCOUNTER — Other Ambulatory Visit: Payer: Self-pay

## 2023-03-22 DIAGNOSIS — Z1211 Encounter for screening for malignant neoplasm of colon: Secondary | ICD-10-CM

## 2023-03-22 DIAGNOSIS — D124 Benign neoplasm of descending colon: Secondary | ICD-10-CM | POA: Diagnosis not present

## 2023-03-22 DIAGNOSIS — Z7985 Long-term (current) use of injectable non-insulin antidiabetic drugs: Secondary | ICD-10-CM | POA: Insufficient documentation

## 2023-03-22 DIAGNOSIS — Z794 Long term (current) use of insulin: Secondary | ICD-10-CM | POA: Insufficient documentation

## 2023-03-22 DIAGNOSIS — N401 Enlarged prostate with lower urinary tract symptoms: Secondary | ICD-10-CM | POA: Diagnosis not present

## 2023-03-22 DIAGNOSIS — M1990 Unspecified osteoarthritis, unspecified site: Secondary | ICD-10-CM | POA: Insufficient documentation

## 2023-03-22 DIAGNOSIS — G4730 Sleep apnea, unspecified: Secondary | ICD-10-CM | POA: Insufficient documentation

## 2023-03-22 DIAGNOSIS — K219 Gastro-esophageal reflux disease without esophagitis: Secondary | ICD-10-CM | POA: Insufficient documentation

## 2023-03-22 DIAGNOSIS — Z7984 Long term (current) use of oral hypoglycemic drugs: Secondary | ICD-10-CM | POA: Insufficient documentation

## 2023-03-22 DIAGNOSIS — Z96652 Presence of left artificial knee joint: Secondary | ICD-10-CM | POA: Diagnosis not present

## 2023-03-22 DIAGNOSIS — D126 Benign neoplasm of colon, unspecified: Secondary | ICD-10-CM | POA: Diagnosis not present

## 2023-03-22 DIAGNOSIS — F419 Anxiety disorder, unspecified: Secondary | ICD-10-CM | POA: Diagnosis not present

## 2023-03-22 DIAGNOSIS — K635 Polyp of colon: Secondary | ICD-10-CM | POA: Diagnosis not present

## 2023-03-22 DIAGNOSIS — D122 Benign neoplasm of ascending colon: Secondary | ICD-10-CM | POA: Diagnosis not present

## 2023-03-22 DIAGNOSIS — E1140 Type 2 diabetes mellitus with diabetic neuropathy, unspecified: Secondary | ICD-10-CM | POA: Insufficient documentation

## 2023-03-22 DIAGNOSIS — I10 Essential (primary) hypertension: Secondary | ICD-10-CM | POA: Diagnosis not present

## 2023-03-22 DIAGNOSIS — K5730 Diverticulosis of large intestine without perforation or abscess without bleeding: Secondary | ICD-10-CM | POA: Diagnosis not present

## 2023-03-22 DIAGNOSIS — N400 Enlarged prostate without lower urinary tract symptoms: Secondary | ICD-10-CM | POA: Diagnosis not present

## 2023-03-22 DIAGNOSIS — Z79899 Other long term (current) drug therapy: Secondary | ICD-10-CM | POA: Diagnosis not present

## 2023-03-22 DIAGNOSIS — F32A Depression, unspecified: Secondary | ICD-10-CM | POA: Insufficient documentation

## 2023-03-22 DIAGNOSIS — Z9049 Acquired absence of other specified parts of digestive tract: Secondary | ICD-10-CM | POA: Diagnosis not present

## 2023-03-22 DIAGNOSIS — Z96612 Presence of left artificial shoulder joint: Secondary | ICD-10-CM | POA: Diagnosis not present

## 2023-03-22 DIAGNOSIS — Z86010 Personal history of colonic polyps: Secondary | ICD-10-CM

## 2023-03-22 HISTORY — PX: COLONOSCOPY WITH PROPOFOL: SHX5780

## 2023-03-22 LAB — GLUCOSE, CAPILLARY: Glucose-Capillary: 140 mg/dL — ABNORMAL HIGH (ref 70–99)

## 2023-03-22 SURGERY — COLONOSCOPY WITH PROPOFOL
Anesthesia: General

## 2023-03-22 MED ORDER — LIDOCAINE HCL (CARDIAC) PF 100 MG/5ML IV SOSY
PREFILLED_SYRINGE | INTRAVENOUS | Status: DC | PRN
  Administered 2023-03-22: 100 mg via INTRAVENOUS

## 2023-03-22 MED ORDER — SODIUM CHLORIDE 0.9 % IV SOLN: INTRAVENOUS | Status: DC

## 2023-03-22 MED ORDER — DEXMEDETOMIDINE HCL IN NACL 80 MCG/20ML IV SOLN
INTRAVENOUS | Status: DC | PRN
  Administered 2023-03-22: 4 ug via BUCCAL

## 2023-03-22 MED ORDER — PROPOFOL 500 MG/50ML IV EMUL
INTRAVENOUS | Status: DC | PRN
  Administered 2023-03-22: 50 mg via INTRAVENOUS
  Administered 2023-03-22: 150 ug/kg/min via INTRAVENOUS

## 2023-03-22 NOTE — Transfer of Care (Signed)
Immediate Anesthesia Transfer of Care Note  Patient: Eric Andrews  Procedure(s) Performed: COLONOSCOPY WITH PROPOFOL  Patient Location: PACU  Anesthesia Type:General  Level of Consciousness: awake, oriented, and drowsy  Airway & Oxygen Therapy: Patient Spontanous Breathing  Post-op Assessment: Report given to RN and Post -op Vital signs reviewed and stable  Post vital signs: stable  Last Vitals:  Vitals Value Taken Time  BP 106/58 03/22/23 1029  Temp 36.1 C 03/22/23 1028  Pulse 68 03/22/23 1029  Resp 13 03/22/23 1029  SpO2 96 % 03/22/23 1029  Vitals shown include unvalidated device data.  Last Pain:  Vitals:   03/22/23 1028  TempSrc: Temporal  PainSc: 0-No pain         Complications: No notable events documented.

## 2023-03-22 NOTE — Op Note (Signed)
[facility] Gastroenterology Patient Name: Eric Andrews Procedure Date: 03/22/2023 9:59 AM MRN: [id number] Account #: [id number] Date of Birth: Jan 26, 1951 Admit Type: Outpatient Age: 72 Room: [facility] ENDO ROOM 1 Gender: Male Note Status: Finalized Instrument Name: Prentice Docker [id number] Procedure:             Colonoscopy Indications:           Screening for colorectal malignant neoplasm Providers:             Wyline Mood MD, MD Referring MD:          Smitty Cords (Referring MD) Medicines:             Monitored Anesthesia Care Complications:         No immediate complications. Procedure:             Pre-Anesthesia Assessment:                        - Prior to the procedure, a History and Physical was                         performed, and patient medications, allergies and                         sensitivities were reviewed. The patient's tolerance                         of previous anesthesia was reviewed.                        - The risks and benefits of the procedure and the                         sedation options and risks were discussed with the                         patient. All questions were answered and informed                         consent was obtained.                        - ASA Grade Assessment: II - A patient with mild                         systemic disease.                        After obtaining informed consent, the colonoscope was                         passed under direct vision. Throughout the procedure,                         the patient's blood pressure, pulse, and oxygen                         saturations were monitored continuously. The                         Colonoscope was introduced  through the anus and                         advanced to the the cecum, identified by the                         appendiceal orifice. The colonoscopy was performed                         with ease. The patient tolerated the procedure well.                          The quality of the bowel preparation was good. The                         ileocecal valve, appendiceal orifice, and rectum were                         photographed. Findings:      The perianal and digital rectal examinations were normal.      Multiple medium-mouthed diverticula were found in the sigmoid colon.      Three sessile polyps were found in the ascending colon. The polyps were       5 to 6 mm in size. These polyps were removed with a cold snare.       Resection and retrieval were complete.      A 4 mm polyp was found in the descending colon. The polyp was sessile.       The polyp was removed with a cold snare. Resection and retrieval were       complete.      The exam was otherwise without abnormality on direct and retroflexion       views. Impression:            - Diverticulosis in the sigmoid colon.                        - Three 5 to 6 mm polyps in the ascending colon,                         removed with a cold snare. Resected and retrieved.                        - One 4 mm polyp in the descending colon, removed with                         a cold snare. Resected and retrieved.                        - The examination was otherwise normal on direct and                         retroflexion views. Recommendation:        - Discharge patient to home (with escort).                        - Resume previous diet.                        -  Continue present medications.                        - Await pathology results.                        - Repeat colonoscopy for surveillance based on                         pathology results. Procedure Code(s):     --- Professional ---                        [phone], Colonoscopy, flexible; with removal of                         tumor(s), polyp(s), or other lesion(s) by snare                         technique Diagnosis Code(s):     --- Professional ---                        Z12.11, Encounter for screening for  malignant neoplasm                         of colon                        D12.2, Benign neoplasm of ascending colon                        D12.4, Benign neoplasm of descending colon                        K57.30, Diverticulosis of large intestine without                         perforation or abscess without bleeding CPT copyright 2022 American Medical Association. All rights reserved. The codes documented in this report are preliminary and upon coder review may  be revised to meet current compliance requirements. Wyline Mood, MD Wyline Mood MD, MD 03/22/2023 10:28:08 AM This report has been signed electronically. Number of Addenda: 0 Note Initiated On: 03/22/2023 9:59 AM Scope Withdrawal Time: 0 hours 10 minutes 14 seconds  Total Procedure Duration: 0 hours 14 minutes 46 seconds  Estimated Blood Loss:  Estimated blood loss: none.      [facility]

## 2023-03-22 NOTE — H&P (Signed)
Wyline Mood, MD [address], [address], [place], [place], [address] [address], [place], [place], [address] Phone: [phone]  Fax: [phone]  Primary Care Physician:  Smitty Cords, DO   Pre-Procedure History & Physical: HPI:  Eric Andrews is a 72 y.o. male is here for an colonoscopy.   Past Medical History:  Diagnosis Date   ADD (attention deficit disorder)    Anemia    Anxiety    Arthritis    Depression    Diabetes mellitus without complication    Enlarged prostate    GERD (gastroesophageal reflux disease)    History of kidney stones    Hypertension    Knee pain    Neuromuscular disorder    lower legs- neuropathy   Sleep apnea     Past Surgical History:  Procedure Laterality Date   APPENDECTOMY     CARPEL TUNEL Bilateral    COLONOSCOPY W/ POLYPECTOMY     HERNIA REPAIR     Left   JOINT REPLACEMENT     KNEE ARTHROSCOPY     Left   RIGHT WRIST SX X2     TOTAL KNEE ARTHROPLASTY Left 04/21/2022   Procedure: TOTAL KNEE ARTHROPLASTY;  Surgeon: Kennedy Bucker, MD;  Location: [facility] ORS;  Service: Orthopedics;  Laterality: Left;   TOTAL SHOULDER ARTHROPLASTY Left 01/05/2023   Procedure: Left total shoulder arthroplasty, biceps tenodesis;  Surgeon: Signa Kell, MD;  Location: [facility] ORS;  Service: Orthopedics;  Laterality: Left;   vascetomy      Prior to Admission medications   Medication Sig Start Date End Date Taking? Authorizing Provider  celecoxib (CELEBREX) 200 MG capsule Take 1 capsule (200 mg total) by mouth 2 (two) times daily. 03/01/23  Yes Karamalegos, Netta Neat, DO  chlorthalidone (HYGROTON) 25 MG tablet Take 1 tablet (25 mg total) by mouth daily. 08/11/22  Yes Karamalegos, Netta Neat, DO  cholecalciferol (VITAMIN D3) 25 MCG (1000 UNIT) tablet Take 1,000 Units by mouth in the morning and at bedtime.   Yes [provider]  fenofibrate micronized (LOFIBRA) 134 MG capsule Take 1 capsule (134 mg total) by mouth daily before  breakfast. TAKE 1 CAPSULE(134 MG) BY MOUTH DAILY BEFORE BREAKFAST 08/11/22  Yes Karamalegos, Alexander J, DO  finasteride (PROSCAR) 5 MG tablet TAKE 1 TABLET(5 MG) BY MOUTH EVERY EVENING 08/11/22  Yes Karamalegos, Alexander J, DO  insulin degludec (TRESIBA FLEXTOUCH) 100 UNIT/ML FlexTouch Pen Inject 68 Units into the skin at bedtime. Patient taking differently: Inject 60 Units into the skin every evening. 01/29/22  Yes Karamalegos, Netta Neat, DO  losartan (COZAAR) 50 MG tablet Take 1 tablet (50 mg total) by mouth daily. 08/11/22  Yes Karamalegos, Netta Neat, DO  metFORMIN (GLUCOPHAGE-XR) 500 MG 24 hr tablet Take 2 tablets (1,000 mg total) by mouth 2 (two) times daily with a meal. Add refills for future 08/11/22  Yes Karamalegos, Netta Neat, DO  Multiple Vitamin (MULTIVITAMIN) capsule Take 1 capsule by mouth daily.   Yes [provider]  Omega-3 Fatty Acids (FISH OIL) 1200 MG CAPS Take 1,200 mg by mouth in the morning and at bedtime.   Yes [provider]  omeprazole (PRILOSEC) 20 MG capsule TAKE ONE CAPSULE BY MOUTH DAILY BEFORE BREAKFAST 08/11/22  Yes Karamalegos, Netta Neat, DO  QUEtiapine (SEROQUEL) 25 MG tablet Take 25-50 mg by mouth at bedtime.   Yes [provider]  tamsulosin (FLOMAX) 0.4 MG CAPS capsule TAKE 1 CAPSULE BY MOUTH EVERY NIGHT AT BEDTIME FOR  URINE RETENTION 08/11/22  Yes Smitty Cords, DO  venlafaxine XR (EFFEXOR-XR) 150 MG 24 hr capsule Take 1 capsule (150 mg total) by mouth daily with breakfast. 08/11/22  Yes Karamalegos, Netta Neat, DO  acetaminophen (TYLENOL) 500 MG tablet Take 2 tablets (1,000 mg total) by mouth every 8 (eight) hours. 01/05/23 01/05/24  Signa Kell, MD  albuterol (VENTOLIN HFA) 108 (90 Base) MCG/ACT inhaler Inhale 2 puffs into the lungs every 6 (six) hours as needed for wheezing or shortness of breath. Patient not taking: Reported on 02/19/2023 11/23/22   Smitty Cords, DO  Ascorbic Acid (VITAMIN C) 1000 MG tablet Take  1,000 mg by mouth daily.    [provider]  atorvastatin (LIPITOR) 20 MG tablet Take 1 tablet (20 mg total) by mouth at bedtime. 08/11/22   Karamalegos, Netta Neat, DO  Bacillus Coagulans-Inulin (PROBIOTIC) 1-250 BILLION-MG CAPS Take 1 capsule by mouth daily. 07/30/21   Ward, Layla Maw, DO  busPIRone (BUSPAR) 7.5 MG tablet Take 7.5 mg by mouth 2 (two) times daily. 03/23/22   [provider]  cetirizine (ZYRTEC) 10 MG tablet TAKE 1 TABLET(10 MG) BY MOUTH AT BEDTIME 08/11/22   Karamalegos, Alexander J, DO  FARXIGA 10 MG TABS tablet Take 1 tablet (10 mg total) by mouth daily before breakfast. 10/14/22   Karamalegos, Netta Neat, DO  fluticasone (FLONASE) 50 MCG/ACT nasal spray Place 2 sprays into both nostrils daily. Use for 4-6 weeks then stop and use seasonally or as needed. 11/23/22   Karamalegos, Netta Neat, DO  gabapentin (NEURONTIN) 600 MG tablet Take 600 mg by mouth in the morning, at noon, in the evening, and at bedtime. 10/21/21 01/05/23  [provider]  hydrocortisone 2.5 % cream APPLY TOPICALLY TO THE AFFECTED AREA OF FACE AND EARS 3 days a week Monday, Wednesday, Friday 01/07/23   Deirdre Evener, MD  Insulin Pen Needle 31G X 5 MM MISC Use with insulin to inject into skin 3 times daily as directed 10/10/20   Althea Charon, Netta Neat, DO  ketoconazole (NIZORAL) 2 % cream APPLY EXTERNALLY TO THE AFFECTED AREA OF FACE AND EARS 3 days a week on TUESDAY, THURSDAY, AND SATURDAY 01/07/23   Deirdre Evener, MD  NOVOLOG FLEXPEN 100 UNIT/ML FlexPen Inject 14 Units into the skin 3 (three) times daily with meals. Sliding scale 03/11/21   Karamalegos, Netta Neat, DO  ondansetron (ZOFRAN-ODT) 4 MG disintegrating tablet Take 1 tablet (4 mg total) by mouth every 8 (eight) hours as needed for nausea or vomiting. Patient not taking: Reported on 02/25/2023 01/05/23   Signa Kell, MD  [facility], 1 MG/DOSE, 4 MG/3ML SOPN Inject 1 mg into the skin once a week. 01/29/22   Karamalegos, Netta Neat,  DO  pioglitazone (ACTOS) 15 MG tablet Take 1 tablet (15 mg total) by mouth daily. OFFICE VISIT NEEDED FOR ADDITIONAL REFILLS, CALL OFFICE TO SCHEDULE Patient not taking: Reported on 02/25/2023 12/09/22   Smitty Cords, DO  traMADol (ULTRAM) 50 MG tablet Take 50 mg by mouth 3 (three) times daily as needed.    [provider]  traZODone (DESYREL) 100 MG tablet Take 1.5 tablets (150 mg total) by mouth at bedtime. Patient taking differently: Take 200 mg by mouth at bedtime. 08/11/22   Smitty Cords, DO    Allergies as of 02/25/2023 - Review Complete 02/25/2023  Allergen Reaction Noted   Dust mite extract  05/27/2020    Family History  Problem Relation Age of Onset   Heart attack Mother  86   Lung cancer Father    Alzheimer's disease Brother    Heart attack Maternal Grandfather 59    Social History   Socioeconomic History   Marital status: Married    Spouse name: LEVOY GEISEN   Number of children: 6   Years of education: Not on file   Highest education level: Not on file  Occupational History   Occupation: retired  Tobacco Use   Smoking status: Never   Smokeless tobacco: Never  Vaping Use   Vaping Use: Never used  Substance and Sexual Activity   Alcohol use: Not Currently   Drug use: Never   Sexual activity: Not Currently  Other Topics Concern   Not on file  Social History Narrative   Live with 2 daughters, 3 grandkids, 1 great grandskids   Social Determinants of Health   Financial Resource Strain: Low Risk  (07/27/2022)   Overall Financial Resource Strain (CARDIA)    Difficulty of Paying Living Expenses: Not hard at all  Food Insecurity: No Food Insecurity (07/27/2022)   Hunger Vital Sign    Worried About Running Out of Food in the Last Year: Never true    Ran Out of Food in the Last Year: Never true  Transportation Needs: No Transportation Needs (07/27/2022)   PRAPARE - Administrator, Civil Service (Medical): No    Lack of  Transportation (Non-Medical): No  Physical Activity: Insufficiently Active (07/27/2022)   Exercise Vital Sign    Days of Exercise per Week: 3 days    Minutes of Exercise per Session: 20 min  Stress: No Stress Concern Present (07/27/2022)   [employer] of Occupational Health - Occupational Stress Questionnaire    Feeling of Stress : Not at all  Social Connections: Socially Integrated (07/27/2022)   Social Connection and Isolation Panel [NHANES]    Frequency of Communication with Friends and Family: More than three times a week    Frequency of Social Gatherings with Friends and Family: More than three times a week    Attends Religious Services: More than 4 times per year    Active Member of [employer] or Organizations: Yes    Attends Banker Meetings: Never    Marital Status: Married  Catering manager Violence: Not At Risk (07/27/2022)   Humiliation, Afraid, Rape, and Kick questionnaire    Fear of Current or Ex-Partner: No    Emotionally Abused: No    Physically Abused: No    Sexually Abused: No    Review of Systems: See HPI, otherwise negative ROS  Physical Exam: BP (!) 150/84   Pulse 75   Temp (!) 97.3 F (36.3 C) (Temporal)   Resp 18   Ht 5\' 11"  (1.803 m)   Wt 134.7 kg   SpO2 98%   BMI 41.42 kg/m  General:   Alert,  pleasant and cooperative in NAD Head:  Normocephalic and atraumatic. Neck:  Supple; no masses or thyromegaly. Lungs:  Clear throughout to auscultation, normal respiratory effort.    Heart:  +S1, +S2, Regular rate and rhythm, No edema. Abdomen:  Soft, nontender and nondistended. Normal bowel sounds, without guarding, and without rebound.   Neurologic:  Alert and  oriented x4;  grossly normal neurologically.  Impression/Plan: Eric Andrews is here for an colonoscopy to be performed for Screening colonoscopy average risk   Risks, benefits, limitations, and alternatives regarding  colonoscopy have been reviewed with the patient.  Questions have been  answered.  All parties agreeable.   Sharlet Salina  Tobi Bastos, MD  03/22/2023, 9:30 AM

## 2023-03-22 NOTE — Anesthesia Preprocedure Evaluation (Signed)
Anesthesia Evaluation  Patient identified by MRN, date of birth, ID band Patient awake    Reviewed: Allergy & Precautions, NPO status , Patient's Chart, lab work & pertinent test results  History of Anesthesia Complications Negative for: history of anesthetic complications  Airway Mallampati: III  TM Distance: >3 FB Neck ROM: full    Dental  (+) Chipped   Pulmonary sleep apnea and Continuous Positive Airway Pressure Ventilation    Pulmonary exam normal        Cardiovascular hypertension, On Medications negative cardio ROS Normal cardiovascular exam     Neuro/Psych  PSYCHIATRIC DISORDERS Anxiety Depression     Neuromuscular disease    GI/Hepatic Neg liver ROS,GERD  Medicated,,  Endo/Other  negative endocrine ROSdiabetes    Renal/GU negative Renal ROS  negative genitourinary   Musculoskeletal  (+) Arthritis ,    Abdominal   Peds  Hematology  (+) Blood dyscrasia, anemia   Anesthesia Other Findings Past Medical History: No date: ADD (attention deficit disorder) No date: Anemia No date: Anxiety No date: Arthritis No date: Depression No date: Diabetes mellitus without complication No date: Enlarged prostate No date: GERD (gastroesophageal reflux disease) No date: History of kidney stones No date: Hypertension No date: Knee pain No date: Neuromuscular disorder     Comment:  lower legs- neuropathy No date: Sleep apnea  Past Surgical History: No date: APPENDECTOMY No date: CARPEL TUNEL; Bilateral No date: COLONOSCOPY W/ POLYPECTOMY No date: HERNIA REPAIR     Comment:  Left No date: JOINT REPLACEMENT No date: KNEE ARTHROSCOPY     Comment:  Left No date: RIGHT WRIST SX X2 04/21/2022: TOTAL KNEE ARTHROPLASTY; Left     Comment:  Procedure: TOTAL KNEE ARTHROPLASTY;  Surgeon: Kennedy Bucker, MD;  Location: [facility] ORS;  Service: Orthopedics;               Laterality: Left; 01/05/2023: TOTAL SHOULDER  ARTHROPLASTY; Left     Comment:  Procedure: Left total shoulder arthroplasty, biceps               tenodesis;  Surgeon: Signa Kell, MD;  Location: [facility]               ORS;  Service: Orthopedics;  Laterality: Left; No date: vascetomy  BMI    Body Mass Index: 41.42 kg/m      Reproductive/Obstetrics negative OB ROS                             Anesthesia Physical Anesthesia Plan  ASA: 3  Anesthesia Plan: General   Post-op Pain Management: Minimal or no pain anticipated   Induction: Intravenous  PONV Risk Score and Plan: Propofol infusion and TIVA  Airway Management Planned: Natural Airway and Nasal Cannula  Additional Equipment:   Intra-op Plan:   Post-operative Plan:   Informed Consent: I have reviewed the patients History and Physical, chart, labs and discussed the procedure including the risks, benefits and alternatives for the proposed anesthesia with the patient or authorized representative who has indicated his/her understanding and acceptance.     Dental Advisory Given  Plan Discussed with: Anesthesiologist, CRNA and Surgeon  Anesthesia Plan Comments: (Patient consented for risks of anesthesia including but not limited to:  - adverse reactions to medications - risk of airway placement if required - damage to eyes, teeth, lips or other oral mucosa - nerve damage  due to positioning  - sore throat or hoarseness - Damage to heart, brain, nerves, lungs, other parts of body or loss of life  Patient voiced understanding.)       Anesthesia Quick Evaluation

## 2023-03-22 NOTE — Anesthesia Postprocedure Evaluation (Signed)
Anesthesia Post Note  Patient: Warrick Demmer  Procedure(s) Performed: COLONOSCOPY WITH PROPOFOL  Patient location during evaluation: Endoscopy Anesthesia Type: General Level of consciousness: awake and alert Pain management: pain level controlled Vital Signs Assessment: post-procedure vital signs reviewed and stable Respiratory status: spontaneous breathing, nonlabored ventilation, respiratory function stable and patient connected to nasal cannula oxygen Cardiovascular status: blood pressure returned to baseline and stable Postop Assessment: no apparent nausea or vomiting Anesthetic complications: no   There were no known notable events for this encounter.   Last Vitals:  Vitals:   03/22/23 0923 03/22/23 1028  BP: (!) 150/84 (!) 106/58  Pulse: 75   Resp: 18   Temp: (!) 36.3 C (!) 36.1 C  SpO2: 98%     Last Pain:  Vitals:   03/22/23 1048  TempSrc:   PainSc: 0-No pain                 Louie Boston

## 2023-03-23 ENCOUNTER — Encounter: Payer: Self-pay | Admitting: Gastroenterology

## 2023-03-23 LAB — SURGICAL PATHOLOGY

## 2023-03-24 DIAGNOSIS — M19019 Primary osteoarthritis, unspecified shoulder: Secondary | ICD-10-CM | POA: Diagnosis not present

## 2023-03-30 ENCOUNTER — Telehealth: Payer: Self-pay | Admitting: Family Medicine

## 2023-03-30 NOTE — Telephone Encounter (Signed)
Informed patient that medication samples are ready for pick up.

## 2023-03-31 ENCOUNTER — Ambulatory Visit: Payer: Medicare Other | Admitting: Dermatology

## 2023-03-31 VITALS — BP 129/78 | HR 71

## 2023-03-31 DIAGNOSIS — Z79899 Other long term (current) drug therapy: Secondary | ICD-10-CM

## 2023-03-31 DIAGNOSIS — D229 Melanocytic nevi, unspecified: Secondary | ICD-10-CM | POA: Diagnosis not present

## 2023-03-31 DIAGNOSIS — L814 Other melanin hyperpigmentation: Secondary | ICD-10-CM | POA: Diagnosis not present

## 2023-03-31 DIAGNOSIS — L219 Seborrheic dermatitis, unspecified: Secondary | ICD-10-CM | POA: Diagnosis not present

## 2023-03-31 DIAGNOSIS — Z1283 Encounter for screening for malignant neoplasm of skin: Secondary | ICD-10-CM | POA: Diagnosis not present

## 2023-03-31 DIAGNOSIS — L820 Inflamed seborrheic keratosis: Secondary | ICD-10-CM

## 2023-03-31 DIAGNOSIS — L578 Other skin changes due to chronic exposure to nonionizing radiation: Secondary | ICD-10-CM

## 2023-03-31 DIAGNOSIS — L821 Other seborrheic keratosis: Secondary | ICD-10-CM | POA: Diagnosis not present

## 2023-03-31 MED ORDER — KETOCONAZOLE 2 % EX CREA
TOPICAL_CREAM | CUTANEOUS | 11 refills | Status: DC
Start: 2023-03-31 — End: 2024-03-30

## 2023-03-31 NOTE — Patient Instructions (Addendum)
Seborrheic Keratosis  What causes seborrheic keratoses? Seborrheic keratoses are harmless, common skin growths that first appear during adult life.  As time goes by, more growths appear.  Some people may develop a large number of them.  Seborrheic keratoses appear on both covered and uncovered body parts.  They are not caused by sunlight.  The tendency to develop seborrheic keratoses can be inherited.  They vary in color from skin-colored to gray, brown, or even black.  They can be either smooth or have a rough, warty surface.   Seborrheic keratoses are superficial and look as if they were stuck on the skin.  Under the microscope this type of keratosis looks like layers upon layers of skin.  That is why at times the top layer may seem to fall off, but the rest of the growth remains and re-grows.    Treatment Seborrheic keratoses do not need to be treated, but can easily be removed in the office.  Seborrheic keratoses often cause symptoms when they rub on clothing or jewelry.  Lesions can be in the way of shaving.  If they become inflamed, they can cause itching, soreness, or burning.  Removal of a seborrheic keratosis can be accomplished by freezing, burning, or surgery. If any spot bleeds, scabs, or grows rapidly, please return to have it checked, as these can be an indication of a skin cancer.  Cryotherapy Aftercare  Wash gently with soap and water everyday.   Apply Vaseline and Band-Aid daily until healed.    Melanoma ABCDEs  Melanoma is the most dangerous type of skin cancer, and is the leading cause of death from skin disease.  You are more likely to develop melanoma if you: Have light-colored skin, light-colored eyes, or red or blond hair Spend a lot of time in the sun Tan regularly, either outdoors or in a tanning bed Have had blistering sunburns, especially during childhood Have a close family member who has had a melanoma Have atypical moles or large birthmarks  Early detection of  melanoma is key since treatment is typically straightforward and cure rates are extremely high if we catch it early.   The first sign of melanoma is often a change in a mole or a new dark spot.  The ABCDE system is a way of remembering the signs of melanoma.  A for asymmetry:  The two halves do not match. B for border:  The edges of the growth are irregular. C for color:  A mixture of colors are present instead of an even brown color. D for diameter:  Melanomas are usually (but not always) greater than 6mm - the size of a pencil eraser. E for evolution:  The spot keeps changing in size, shape, and color.  Please check your skin once per month between visits. You can use a small mirror in front and a large mirror behind you to keep an eye on the back side or your body.   If you see any new or changing lesions before your next follow-up, please call to schedule a visit.  Please continue daily skin protection including broad spectrum sunscreen SPF 30+ to sun-exposed areas, reapplying every 2 hours as needed when you're outdoors.   Staying in the shade or wearing long sleeves, sun glasses (UVA+UVB protection) and wide brim hats (4-inch brim around the entire circumference of the hat) are also recommended for sun protection.    Due to recent changes in healthcare laws, you may see results of your pathology and/or laboratory   studies on MyChart before the doctors have had a chance to review them. We understand that in some cases there may be results that are confusing or concerning to you. Please understand that not all results are received at the same time and often the doctors may need to interpret multiple results in order to provide you with the best plan of care or course of treatment. Therefore, we ask that you please give us 2 business days to thoroughly review all your results before contacting the office for clarification. Should we see a critical lab result, you will be contacted sooner.   If  You Need Anything After Your Visit  If you have any questions or concerns for your doctor, please call our main line at [phone] and press option 4 to reach your doctor's medical assistant. If no one answers, please leave a voicemail as directed and we will return your call as soon as possible. Messages left after 4 pm will be answered the following business day.   You may also send us a message via MyChart. We typically respond to MyChart messages within 1-2 business days.  For prescription refills, please ask your pharmacy to contact our office. Our fax number is [phone].  If you have an urgent issue when the clinic is closed that cannot wait until the next business day, you can page your doctor at the number below.    Please note that while we do our best to be available for urgent issues outside of office hours, we are not available 24/7.   If you have an urgent issue and are unable to reach us, you may choose to seek medical care at your doctor's office, retail clinic, urgent care center, or emergency room.  If you have a medical emergency, please immediately call 911 or go to the emergency department.  Pager Numbers  - Dr. Kowalski: [phone]  - Dr. Moye: [phone]  - Dr. Stewart: [phone]  In the event of inclement weather, please call our main line at [phone] for an update on the status of any delays or closures.  Dermatology Medication Tips: Please keep the boxes that topical medications come in in order to help keep track of the instructions about where and how to use these. Pharmacies typically print the medication instructions only on the boxes and not directly on the medication tubes.   If your medication is too expensive, please contact our office at [phone] option 4 or send us a message through MyChart.   We are unable to tell what your co-pay for medications will be in advance as this is different depending on your insurance coverage.  However, we may be able to find a substitute medication at lower cost or fill out paperwork to get insurance to cover a needed medication.   If a prior authorization is required to get your medication covered by your insurance company, please allow us 1-2 business days to complete this process.  Drug prices often vary depending on where the prescription is filled and some pharmacies may offer cheaper prices.  The website www.goodrx.com contains coupons for medications through different pharmacies. The prices here do not account for what the cost may be with help from insurance (it may be cheaper with your insurance), but the website can give you the price if you did not use any insurance.  - You can print the associated coupon and take it with your prescription to the pharmacy.  - You may also stop by our office during   regular business hours and pick up a GoodRx coupon card.  - If you need your prescription sent electronically to a different pharmacy, notify our office through [facility] MyChart or by phone at [phone] option 4.     Si Usted Necesita Algo Despus de Su Visita  Tambin puede enviarnos un mensaje a travs de MyChart. Por lo general respondemos a los mensajes de MyChart en el transcurso de 1 a 2 das hbiles.  Para renovar recetas, por favor pida a su farmacia que se ponga en contacto con nuestra oficina. Nuestro nmero de fax es el [phone].  Si tiene un asunto urgente cuando la clnica est cerrada y que no puede esperar hasta el siguiente da hbil, puede llamar/localizar a su doctor(a) al nmero que aparece a continuacin.   Por favor, tenga en cuenta que aunque hacemos todo lo posible para estar disponibles para asuntos urgentes fuera del horario de oficina, no estamos disponibles las 24 horas del da, los 7 das de la semana.   Si tiene un problema urgente y no puede comunicarse con nosotros, puede optar por buscar atencin mdica  en el consultorio de su  doctor(a), en una clnica privada, en un centro de atencin urgente o en una sala de emergencias.  Si tiene una emergencia mdica, por favor llame inmediatamente al 911 o vaya a la sala de emergencias.  Nmeros de bper  - Dr. Kowalski: [phone]  - Dra. Moye: [phone]  - Dra. Stewart: [phone]  En caso de inclemencias del tiempo, por favor llame a nuestra lnea principal al [phone] para una actualizacin sobre el estado de cualquier retraso o cierre.  Consejos para la medicacin en dermatologa: Por favor, guarde las cajas en las que vienen los medicamentos de uso tpico para ayudarle a seguir las instrucciones sobre dnde y cmo usarlos. Las farmacias generalmente imprimen las instrucciones del medicamento slo en las cajas y no directamente en los tubos del medicamento.   Si su medicamento es muy caro, por favor, pngase en contacto con nuestra oficina llamando al [phone] y presione la opcin 4 o envenos un mensaje a travs de MyChart.   No podemos decirle cul ser su copago por los medicamentos por adelantado ya que esto es diferente dependiendo de la cobertura de su seguro. Sin embargo, es posible que podamos encontrar un medicamento sustituto a menor costo o llenar un formulario para que el seguro cubra el medicamento que se considera necesario.   Si se requiere una autorizacin previa para que su compaa de seguros cubra su medicamento, por favor permtanos de 1 a 2 das hbiles para completar este proceso.  Los precios de los medicamentos varan con frecuencia dependiendo del lugar de dnde se surte la receta y alguna farmacias pueden ofrecer precios ms baratos.  El sitio web www.goodrx.com tiene cupones para medicamentos de diferentes farmacias. Los precios aqu no tienen en cuenta lo que podra costar con la ayuda del seguro (puede ser ms barato con su seguro), pero el sitio web puede darle el precio si no utiliz ningn seguro.  - Puede imprimir el cupn  correspondiente y llevarlo con su receta a la farmacia.  - Tambin puede pasar por nuestra oficina durante el horario de atencin regular y recoger una tarjeta de cupones de GoodRx.  - Si necesita que su receta se enve electrnicamente a una farmacia diferente, informe a nuestra oficina a travs de MyChart de [facility] o por telfono llamando al [phone] y presione la opcin 4.   

## 2023-03-31 NOTE — Progress Notes (Signed)
Follow-Up Visit   Subjective  Eric Andrews is a 72 y.o. male who presents for the following: Skin Cancer Screening and Full Body Skin Exam  Spot at left forearm he would like checked.   The patient presents for Total-Body Skin Exam (TBSE) for skin cancer screening and mole check. The patient has spots, moles and lesions to be evaluated, some may be new or changing and the patient has concerns that these could be cancer.    The following portions of the chart were reviewed this encounter and updated as appropriate: medications, allergies, medical history  Review of Systems:  No other skin or systemic complaints except as noted in HPI or Assessment and Plan.  Objective  Well appearing patient in no apparent distress; mood and affect are within normal limits.  A full examination was performed including scalp, head, eyes, ears, nose, lips, neck, chest, axillae, abdomen, back, buttocks, bilateral upper extremities, bilateral lower extremities, hands, feet, fingers, toes, fingernails, and toenails. All findings within normal limits unless otherwise noted below.   Relevant physical exam findings are noted in the Assessment and Plan.  left forearm x 1 Erythematous stuck-on, waxy papule or plaque    Assessment & Plan   Seborrheic dermatitis face, ears Seborrheic Dermatitis  -  is a chronic persistent rash characterized by pinkness and scaling most commonly of the mid face but also can occur on the scalp (dandruff), ears; mid chest, mid back and groin.  It tends to be exacerbated by stress and cooler weather.  People who have neurologic disease may experience new onset or exacerbation of existing seborrheic dermatitis.  The condition is not curable but treatable and can be controlled.    Chronic condition with duration or expected duration over one year. Currently well-controlled.   Recommend :  Continue ketoconazole 2% cream use topically to aa 3 days a week.  LENTIGINES,  SEBORRHEIC KERATOSES, HEMANGIOMAS - Benign normal skin lesions - Benign-appearing - Call for any changes  MELANOCYTIC NEVI - Tan-brown and/or pink-flesh-colored symmetric macules and papules - Benign appearing on exam today - Observation - Call clinic for new or changing moles - Recommend daily use of broad spectrum spf 30+ sunscreen to sun-exposed areas.   ACTINIC DAMAGE - Chronic condition, secondary to cumulative UV/sun exposure - diffuse scaly erythematous macules with underlying dyspigmentation - Recommend daily broad spectrum sunscreen SPF 30+ to sun-exposed areas, reapply every 2 hours as needed.  - Staying in the shade or wearing long sleeves, sun glasses (UVA+UVB protection) and wide brim hats (4-inch brim around the entire circumference of the hat) are also recommended for sun protection.  - Call for new or changing lesions.  SKIN CANCER SCREENING PERFORMED TODAY.    Inflamed seborrheic keratosis left forearm x 1  Symptomatic, irritating, patient would like treated.  Destruction of lesion - left forearm x 1 Complexity: simple   Destruction method: cryotherapy   Informed consent: discussed and consent obtained   Timeout:  patient name, date of birth, surgical site, and procedure verified Lesion destroyed using liquid nitrogen: Yes   Region frozen until ice ball extended beyond lesion: Yes   Outcome: patient tolerated procedure well with no complications   Post-procedure details: wound care instructions given    Seborrheic dermatitis  Related Medications hydrocortisone 2.5 % cream APPLY TOPICALLY TO THE AFFECTED AREA OF FACE AND EARS 3 days a week Monday, Wednesday, Friday  ketoconazole (NIZORAL) 2 % cream APPLY EXTERNALLY TO THE AFFECTED AREA OF FACE AND EARS 3 days  a week on TUESDAY, THURSDAY, AND SATURDAY   Return in about 1 year (around 03/30/2024) for TBSE.  IAsher Muir, CMA, am acting as scribe for Armida Sans, MD.   Documentation: I have  reviewed the above documentation for accuracy and completeness, and I agree with the above.  Armida Sans, MD

## 2023-04-01 DIAGNOSIS — G4733 Obstructive sleep apnea (adult) (pediatric): Secondary | ICD-10-CM | POA: Diagnosis not present

## 2023-04-04 ENCOUNTER — Other Ambulatory Visit: Payer: Self-pay | Admitting: Family Medicine

## 2023-04-04 DIAGNOSIS — J0110 Acute frontal sinusitis, unspecified: Secondary | ICD-10-CM

## 2023-04-06 NOTE — Telephone Encounter (Signed)
Requested Prescriptions  Pending Prescriptions Disp Refills   fluticasone (FLONASE) 50 MCG/ACT nasal spray [Pharmacy Med Name: FLUTICASONE NASAL SP (120) RX] 16 g 3    Sig: INSTILL 2 SPRAYS INTO BOTH NOSTRILS EVERY DAY     Ear, Nose, and Throat: Nasal Preparations - Corticosteroids Passed - 04/04/2023  7:21 PM      Passed - Valid encounter within last 12 months    Recent Outpatient Visits           1 month ago Screening for colon cancer   [facility] [place], Netta Neat, DO   3 months ago Type 2 diabetes mellitus with other specified complication, with long-term current use of insulin [facility])   [facility] D[facility], Gentry Fitz A, RPH-CPP   3 months ago Type 2 diabetes mellitus with other specified complication, with long-term current use of insulin [facility])   [facility] Delles, Gentry Fitz A, RPH-CPP   4 months ago Acute non-recurrent frontal sinusitis   [facility] [place], Netta Neat, DO   5 months ago Type 2 diabetes mellitus with other specified complication, with long-term current use of insulin [facility])    [facility] Delles, Jackelyn Poling, RPH-CPP       Future Appointments             In 7 months Stoioff, Verna Czech, MD [facility] [place]   In 11 months Deirdre Evener, MD [facility]

## 2023-04-08 ENCOUNTER — Encounter: Payer: Self-pay | Admitting: Dermatology

## 2023-04-13 ENCOUNTER — Encounter: Payer: Self-pay | Admitting: Family Medicine

## 2023-04-14 DIAGNOSIS — Z96612 Presence of left artificial shoulder joint: Secondary | ICD-10-CM | POA: Diagnosis not present

## 2023-05-12 ENCOUNTER — Telehealth: Payer: Self-pay

## 2023-05-12 ENCOUNTER — Encounter: Payer: Self-pay | Admitting: Dermatology

## 2023-05-12 ENCOUNTER — Ambulatory Visit: Payer: Medicare Other | Admitting: Dermatology

## 2023-05-12 VITALS — BP 107/84 | HR 84

## 2023-05-12 DIAGNOSIS — L219 Seborrheic dermatitis, unspecified: Secondary | ICD-10-CM | POA: Diagnosis not present

## 2023-05-12 DIAGNOSIS — L299 Pruritus, unspecified: Secondary | ICD-10-CM | POA: Diagnosis not present

## 2023-05-12 MED ORDER — TACROLIMUS 0.1 % EX OINT
TOPICAL_OINTMENT | CUTANEOUS | 1 refills | Status: DC
Start: 2023-05-12 — End: 2023-08-17

## 2023-05-12 MED ORDER — CICLOPIROX 1 % EX SHAM: MEDICATED_SHAMPOO | CUTANEOUS | 1 refills | Status: DC

## 2023-05-12 MED ORDER — CLOBETASOL PROPIONATE 0.05 % EX SOLN
CUTANEOUS | 1 refills | Status: DC
Start: 2023-05-12 — End: 2023-08-17

## 2023-05-12 MED ORDER — DOXYCYCLINE MONOHYDRATE 100 MG PO CAPS: 100.0000 mg | ORAL_CAPSULE | Freq: Every day | ORAL | 0 refills | Status: DC

## 2023-05-12 MED ORDER — CICLOPIROX 1 % EX SHAM
MEDICATED_SHAMPOO | CUTANEOUS | 1 refills | Status: DC
Start: 2023-05-12 — End: 2023-05-12

## 2023-05-12 MED ORDER — DOXYCYCLINE MONOHYDRATE 100 MG PO CAPS
ORAL_CAPSULE | ORAL | 0 refills | Status: DC
Start: 2023-05-12 — End: 2023-05-12

## 2023-05-12 NOTE — Telephone Encounter (Signed)
New prescription sent in per Dr. Marca Ancona instruction.

## 2023-05-12 NOTE — Telephone Encounter (Signed)
Pharmacy sent fax stating patient requesting 90 day supply of Doxycycline 100 mg take one cap po BID x 2 weeks then decrease to QD thereafter. Please advise.

## 2023-05-12 NOTE — Addendum Note (Signed)
Addended by: Cari Caraway A on: 05/12/2023 01:41 PM   Modules accepted: Orders

## 2023-05-12 NOTE — Patient Instructions (Addendum)
For rash at face and scalp  D/c ketoconazole shampoo and Ketoconazole cream  Start ciclopirox 0.77 % shampoo - massage into scalp, let sit for 5 - 10 minutes before rinsing off.   Start doxycycline 100 mg capsule - take 1 by mouth twice daily with food for 2 weeks. After 2 weeks then decrease take 1 capsule by mouth daily until finished.  Doxycycline should be taken with food to prevent nausea. Do not lay down for 30 minutes after taking. Be cautious with sun exposure and use good sun protection while on this medication. Pregnant women should not take this medication.   Start clobetasol solution - apply topicailly a few drops to affected itchy areas on scalp and ears as needed. Avoid applying to face, groin, and axilla. Use as directed.   Topical steroids (such as triamcinolone, fluocinolone, fluocinonide, mometasone, clobetasol, halobetasol, betamethasone, hydrocortisone) can cause thinning and lightening of the skin if they are used for too long in the same area. Your physician has selected the right strength medicine for your problem and area affected on the body. Please use your medication only as directed by your physician to prevent side effects.   Start tacrolimus ointment - apply topically to affected itchy areas of face twice daily as needed.       Due to recent changes in healthcare laws, you may see results of your pathology and/or laboratory studies on MyChart before the doctors have had a chance to review them. We understand that in some cases there may be results that are confusing or concerning to you. Please understand that not all results are received at the same time and often the doctors may need to interpret multiple results in order to provide you with the best plan of care or course of treatment. Therefore, we ask that you please give [place] 2 business days to thoroughly review all your results before contacting the office for clarification. Should we see a critical lab result,  you will be contacted sooner.   If You Need Anything After Your Visit  If you have any questions or concerns for your doctor, please call our main line at [phone] and press option 4 to reach your doctor's medical assistant. If no one answers, please leave a voicemail as directed and we will return your call as soon as possible. Messages left after 4 pm will be answered the following business day.   You may also send [place] a message via MyChart. We typically respond to MyChart messages within 1-2 business days.  For prescription refills, please ask your pharmacy to contact our office. Our fax number is [phone].  If you have an urgent issue when the clinic is closed that cannot wait until the next business day, you can page your doctor at the number below.    Please note that while we do our best to be available for urgent issues outside of office hours, we are not available 24/7.   If you have an urgent issue and are unable to reach [place], you may choose to seek medical care at your doctor's office, retail clinic, urgent care center, or emergency room.  If you have a medical emergency, please immediately call 911 or go to the emergency department.  Pager Numbers  - Dr. Gwen Pounds: [phone]  - Dr. Neale Burly: [phone]  - Dr. Roseanne Reno: [phone]  In the event of inclement weather, please call our main line at [phone] for an update on the status of any delays or closures.  Dermatology  Medication Tips: Please keep the boxes that topical medications come in in order to help keep track of the instructions about where and how to use these. Pharmacies typically print the medication instructions only on the boxes and not directly on the medication tubes.   If your medication is too expensive, please contact our office at [phone] option 4 or send [place] a message through MyChart.   We are unable to tell what your co-pay for medications will be in advance as this is different  depending on your insurance coverage. However, we may be able to find a substitute medication at lower cost or fill out paperwork to get insurance to cover a needed medication.   If a prior authorization is required to get your medication covered by your insurance company, please allow [place] 1-2 business days to complete this process.  Drug prices often vary depending on where the prescription is filled and some pharmacies may offer cheaper prices.  The website www.goodrx.com contains coupons for medications through different pharmacies. The prices here do not account for what the cost may be with help from insurance (it may be cheaper with your insurance), but the website can give you the price if you did not use any insurance.  - You can print the associated coupon and take it with your prescription to the pharmacy.  - You may also stop by our office during regular business hours and pick up a GoodRx coupon card.  - If you need your prescription sent electronically to a different pharmacy, notify our office through [facility] or by phone at [phone] option 4.     Si Usted Necesita Algo Despus de Su Visita  Tambin puede enviarnos un mensaje a travs de Clinical cytogeneticist. Por lo general respondemos a los mensajes de MyChart en el transcurso de 1 a 2 das hbiles.  Para renovar recetas, por favor pida a su farmacia que se ponga en contacto con nuestra oficina. Annie Sable de fax es [place] [phone].  Si tiene un asunto urgente cuando la clnica est cerrada y que no puede esperar hasta el siguiente da hbil, puede llamar/localizar a su doctor(a) al nmero que aparece a continuacin.   Por favor, tenga en cuenta que aunque hacemos todo lo posible para estar disponibles para asuntos urgentes fuera del horario de [place], no estamos disponibles las 24 horas del da, los 7 [address]  [address] de la [place].   Si tiene un problema urgente y no puede comunicarse con nosotros, puede optar por buscar atencin  mdica  en el consultorio de su doctor(a), en una clnica privada, en un centro de atencin urgente o en una sala de emergencias.  Si tiene Engineer, drilling, por favor llame inmediatamente al 911 o vaya a la sala de emergencias.  Nmeros de bper  - Dr. Gwen Pounds: [phone]  - Dra. Moye: [phone]  - Dra. Roseanne Reno: [phone]  En caso de inclemencias del [place], por favor llame a Lacy Duverney principal al [phone] para una actualizacin sobre el [place] de cualquier retraso o cierre.  Consejos para la medicacin en dermatologa: Por favor, guarde las cajas en las que vienen los medicamentos de uso tpico para ayudarle a seguir las instrucciones sobre dnde y cmo usarlos. Las farmacias generalmente imprimen las instrucciones del medicamento slo en las cajas y no directamente en los tubos del [place].   Si su medicamento es muy caro, por favor, pngase en contacto con Rolm Gala llamando al [phone] y presione la opcin 4 o envenos un mensaje  a travs de MyChart.   No podemos decirle cul ser su copago por los medicamentos por adelantado ya que esto es diferente dependiendo de la cobertura de su seguro. Sin embargo, es posible que podamos encontrar un medicamento sustituto a Audiological scientist un formulario para que el seguro cubra el medicamento que se considera necesario.   Si se requiere una autorizacin previa para que su compaa de seguros [place] su medicamento, por favor permtanos de 1 a 2 das hbiles para completar [address]  Los precios de los medicamentos varan con frecuencia dependiendo del Environmental consultant de dnde se surte la receta y alguna farmacias pueden ofrecer precios ms baratos.  El sitio web www.goodrx.com tiene cupones para medicamentos de Health and safety inspector. Los precios aqu no tienen en cuenta lo que podra costar con la ayuda del seguro (puede ser ms barato con su seguro), pero el sitio web puede darle el precio si no utiliz Producer, television/film/video.  - Puede imprimir el cupn correspondiente y llevarlo con su receta a la farmacia.  - Tambin puede pasar por nuestra oficina durante el horario de atencin regular y Education officer, museum una tarjeta de cupones de GoodRx.  - Si necesita que su receta se enve electrnicamente a una farmacia diferente, informe a nuestra oficina a travs de MyChart de [place] o por telfono llamando al [phone] y presione la opcin 4.

## 2023-05-12 NOTE — Progress Notes (Signed)
Follow-Up Visit   Subjective  Eric Andrews is a 72 y.o. male who presents for the following: Patient reports working in heat outside for work broke out of itchy areas at face and scalp 7 days ago. Has been scratching and has some sores areas at face. Tried using ketoconazole shampoo, ketoconazole cream, and hydrocortisone. Nothing has helped.    The following portions of the chart were reviewed this encounter and updated as appropriate: medications, allergies, medical history  Review of Systems:  No other skin or systemic complaints except as noted in HPI or Assessment and Plan.  Objective  Well appearing patient in no apparent distress; mood and affect are within normal limits.   A focused examination was performed of the following areas: Face, scalp, ears  Relevant exam findings are noted in the Assessment and Plan.    Assessment & Plan   Seborrheic dermatitis vs folliculitis with pruritus  face, scalp  Purpuric crusted papules at crown and right upper eyelid  Edema at right upper eyelid   Chronic and persistent condition with duration or expected duration over one year. Condition is bothersome/symptomatic for patient. Currently flared.  Seborrheic Dermatitis  -  is a chronic persistent rash characterized by pinkness and scaling most commonly of the mid face but also can occur on the scalp (dandruff), ears; mid chest, mid back and groin.  It tends to be exacerbated by stress and cooler weather.  People who have neurologic disease may experience new onset or exacerbation of existing seborrheic dermatitis.  The condition is not curable but treatable and can be controlled.    Treatment Plan:  D/c ketoconazole shampoo and Ketoconazole cream for now  Start doxycycline 100 mg capsule - take 1 by mouth twice daily with food for 2 weeks. After 2 weeks then decrease take 1 capsule by mouth daily until finished.   Doxycycline should be taken with food to prevent nausea. Do not lay  down for 30 minutes after taking. Be cautious with sun exposure and use good sun protection while on this medication. Pregnant women should not take this medication.   Start tacrolimus ointment - apply topically to affected itchy areas of face/eyelids twice daily as needed.   Start ciclopirox 0.77 % shampoo - massage into scalp, let sit for 5 - 10 minutes before rinsing off.   Start clobetasol solution - apply topicailly a few drops to affected itchy areas on scalp and ears as needed. Avoid applying to face, groin, and axilla. Use as directed.   Topical steroids (such as triamcinolone, fluocinolone, fluocinonide, mometasone, clobetasol, halobetasol, betamethasone, hydrocortisone) can cause thinning and lightening of the skin if they are used for too long in the same area. Your physician has selected the right strength medicine for your problem and area affected on the body. Please use your medication only as directed by your physician to prevent side effects.     Seborrheic dermatitis  Related Medications hydrocortisone 2.5 % cream APPLY TOPICALLY TO THE AFFECTED AREA OF FACE AND EARS 3 days a week Monday, Wednesday, Friday  ketoconazole (NIZORAL) 2 % cream APPLY EXTERNALLY TO THE AFFECTED AREA OF FACE AND EARS 3 days a week on TUESDAY, THURSDAY, AND SATURDAY  tacrolimus (PROTOPIC) 0.1 % ointment Apply to aa of face twice daily as needed for itchy rash  doxycycline (MONODOX) 100 MG capsule Take 1 capsule by mouth twice daily with food for 2 weeks. After 2 weeks decrease to 1 capsule by mouth daily. Finish all medication  clobetasol (  TEMOVATE) 0.05 % external solution Apply topically a few drops to affected itchy areas on scalp and ears as needed. Avoid applying to face, groin, and axilla. Use as directed.  Ciclopirox 1 % shampoo apply topically, massage into scalp and leave in for 5 -10 minutes before rinsing out    Return for 2 week follow up on rash .  I, Asher Muir, CMA,  am acting as scribe for Willeen Niece, MD.   Documentation: I have reviewed the above documentation for accuracy and completeness, and I agree with the above.  Willeen Niece, MD

## 2023-05-14 ENCOUNTER — Other Ambulatory Visit: Payer: Self-pay

## 2023-05-17 ENCOUNTER — Ambulatory Visit (INDEPENDENT_AMBULATORY_CARE_PROVIDER_SITE_OTHER): Payer: Medicare Other | Admitting: Internal Medicine

## 2023-05-17 ENCOUNTER — Encounter: Payer: Self-pay | Admitting: Internal Medicine

## 2023-05-17 VITALS — BP 128/74 | HR 74 | Temp 95.8°F | Wt 301.0 lb

## 2023-05-17 DIAGNOSIS — H6123 Impacted cerumen, bilateral: Secondary | ICD-10-CM

## 2023-05-17 DIAGNOSIS — H9201 Otalgia, right ear: Secondary | ICD-10-CM

## 2023-05-17 NOTE — Progress Notes (Signed)
Subjective:    Patient ID: Eric Andrews, male    DOB: 1951-03-20, 72 y.o.   MRN: [id number]  HPI  Patient presents to clinic today with complaint of right ear pain.  This started 5 days ago.  He describes the pain as fullness.  He has not noticed any decreased hearing or drainage from the ear.  He denies runny nose, nasal congestion, sore throat or cough.  He has tried putting alcohol in his ear with minimal relief of symptoms.  Review of Systems     Past Medical History:  Diagnosis Date   ADD (attention deficit disorder)    Anemia    Anxiety    Arthritis    Depression    Diabetes mellitus without complication (HCC)    Enlarged prostate    GERD (gastroesophageal reflux disease)    History of kidney stones    Hypertension    Knee pain    Neuromuscular disorder (HCC)    lower legs- neuropathy   Sleep apnea     Current Outpatient Medications  Medication Sig Dispense Refill   acetaminophen (TYLENOL) 500 MG tablet Take 2 tablets (1,000 mg total) by mouth every 8 (eight) hours. 90 tablet 2   albuterol (VENTOLIN HFA) 108 (90 Base) MCG/ACT inhaler Inhale 2 puffs into the lungs every 6 (six) hours as needed for wheezing or shortness of breath. (Patient not taking: Reported on 02/19/2023) 8 g 0   Ascorbic Acid (VITAMIN C) 1000 MG tablet Take 1,000 mg by mouth daily.     atorvastatin (LIPITOR) 20 MG tablet Take 1 tablet (20 mg total) by mouth at bedtime. 90 tablet 3   Bacillus Coagulans-Inulin (PROBIOTIC) 1-250 BILLION-MG CAPS Take 1 capsule by mouth daily. 30 capsule 1   busPIRone (BUSPAR) 7.5 MG tablet Take 7.5 mg by mouth 2 (two) times daily.     celecoxib (CELEBREX) 200 MG capsule Take 1 capsule (200 mg total) by mouth 2 (two) times daily. 180 capsule 1   cetirizine (ZYRTEC) 10 MG tablet TAKE 1 TABLET(10 MG) BY MOUTH AT BEDTIME 90 tablet 3   chlorthalidone (HYGROTON) 25 MG tablet Take 1 tablet (25 mg total) by mouth daily. 90 tablet 3   cholecalciferol (VITAMIN D3) 25 MCG (1000 UNIT)  tablet Take 1,000 Units by mouth in the morning and at bedtime.     Ciclopirox 1 % shampoo apply topically, massage into scalp and leave in for 5 -10 minutes before rinsing out 120 mL 1   clobetasol (TEMOVATE) 0.05 % external solution Apply topically a few drops to affected itchy areas on scalp and ears as needed. Avoid applying to face, groin, and axilla. Use as directed. 50 mL 1   doxycycline (MONODOX) 100 MG capsule Take 1 capsule (100 mg total) by mouth daily. Take with food and drink 90 capsule 0   FARXIGA 10 MG TABS tablet Take 1 tablet (10 mg total) by mouth daily before breakfast. 90 tablet 0   fenofibrate micronized (LOFIBRA) 134 MG capsule Take 1 capsule (134 mg total) by mouth daily before breakfast. TAKE 1 CAPSULE(134 MG) BY MOUTH DAILY BEFORE BREAKFAST 90 capsule 3   finasteride (PROSCAR) 5 MG tablet TAKE 1 TABLET(5 MG) BY MOUTH EVERY EVENING 90 tablet 3   fluticasone (FLONASE) 50 MCG/ACT nasal spray INSTILL 2 SPRAYS INTO BOTH NOSTRILS EVERY DAY 16 g 3   gabapentin (NEURONTIN) 600 MG tablet Take 600 mg by mouth in the morning, at noon, in the evening, and at bedtime.  hydrocortisone 2.5 % cream APPLY TOPICALLY TO THE AFFECTED AREA OF FACE AND EARS 3 days a week Monday, Wednesday, Friday 30 g 2   insulin degludec (TRESIBA FLEXTOUCH) 100 UNIT/ML FlexTouch Pen Inject 68 Units into the skin at bedtime. (Patient taking differently: Inject 60 Units into the skin every evening.) 21 mL 5   Insulin Pen Needle 31G X 5 MM MISC Use with insulin to inject into skin 3 times daily as directed 400 each 3   ketoconazole (NIZORAL) 2 % cream APPLY EXTERNALLY TO THE AFFECTED AREA OF FACE AND EARS 3 days a week on TUESDAY, THURSDAY, AND SATURDAY 30 g 11   losartan (COZAAR) 50 MG tablet Take 1 tablet (50 mg total) by mouth daily. 90 tablet 3   metFORMIN (GLUCOPHAGE-XR) 500 MG 24 hr tablet Take 2 tablets (1,000 mg total) by mouth 2 (two) times daily with a meal. Add refills for future 360 tablet 3   Multiple  Vitamin (MULTIVITAMIN) capsule Take 1 capsule by mouth daily.     NOVOLOG FLEXPEN 100 UNIT/ML FlexPen Inject 14 Units into the skin 3 (three) times daily with meals. Sliding scale 15 mL 11   Omega-3 Fatty Acids (FISH OIL) 1200 MG CAPS Take 1,200 mg by mouth in the morning and at bedtime.     ondansetron (ZOFRAN-ODT) 4 MG disintegrating tablet Take 1 tablet (4 mg total) by mouth every 8 (eight) hours as needed for nausea or vomiting. 20 tablet 0   OZEMPIC, 1 MG/DOSE, 4 MG/3ML SOPN Inject 1 mg into the skin once a week. 12 mL 3   pioglitazone (ACTOS) 15 MG tablet Take 1 tablet (15 mg total) by mouth daily. OFFICE VISIT NEEDED FOR ADDITIONAL REFILLS, CALL OFFICE TO SCHEDULE 90 tablet 0   QUEtiapine (SEROQUEL) 25 MG tablet Take 25-50 mg by mouth at bedtime.     tacrolimus (PROTOPIC) 0.1 % ointment Apply to aa of face twice daily as needed for itchy rash 30 g 1   tamsulosin (FLOMAX) 0.4 MG CAPS capsule TAKE 1 CAPSULE BY MOUTH EVERY NIGHT AT BEDTIME FOR URINE RETENTION 90 capsule 3   traMADol (ULTRAM) 50 MG tablet Take 50 mg by mouth 3 (three) times daily as needed.     traZODone (DESYREL) 100 MG tablet Take 1.5 tablets (150 mg total) by mouth at bedtime. (Patient taking differently: Take 200 mg by mouth at bedtime.) 90 tablet 3   venlafaxine XR (EFFEXOR-XR) 150 MG 24 hr capsule Take 1 capsule (150 mg total) by mouth daily with breakfast. 90 capsule 3   No current facility-administered medications for this visit.    Allergies  Allergen Reactions   Dust Mite Extract     Family History  Problem Relation Age of Onset   Heart attack Mother 70   Lung cancer Father    Alzheimer's disease Brother    Heart attack Maternal Grandfather 71    Social History   Socioeconomic History   Marital status: Married    Spouse name: PACEN PARADEE   Number of children: 6   Years of education: Not on file   Highest education level: Not on file  Occupational History   Occupation: retired  Tobacco Use   Smoking  status: Never   Smokeless tobacco: Never  Vaping Use   Vaping Use: Never used  Substance and Sexual Activity   Alcohol use: Not Currently   Drug use: Never   Sexual activity: Not Currently  Other Topics Concern   Not on file  Social History  Narrative   Live with 2 daughters, 3 grandkids, 1 great grandskids   Social Determinants of Health   Financial Resource Strain: Low Risk  (07/27/2022)   Overall Financial Resource Strain (CARDIA)    Difficulty of Paying Living Expenses: Not hard at all  Food Insecurity: No Food Insecurity (07/27/2022)   Hunger Vital Sign    Worried About Running Out of Food in the Last Year: Never true    Ran Out of Food in the Last Year: Never true  Transportation Needs: No Transportation Needs (07/27/2022)   PRAPARE - Administrator, Civil Service (Medical): No    Lack of Transportation (Non-Medical): No  Physical Activity: Insufficiently Active (07/27/2022)   Exercise Vital Sign    Days of Exercise per Week: 3 days    Minutes of Exercise per Session: 20 min  Stress: No Stress Concern Present (07/27/2022)   [employer] of Occupational Health - Occupational Stress Questionnaire    Feeling of Stress : Not at all  Social Connections: Socially Integrated (07/27/2022)   Social Connection and Isolation Panel [NHANES]    Frequency of Communication with Friends and Family: More than three times a week    Frequency of Social Gatherings with Friends and Family: More than three times a week    Attends Religious Services: More than 4 times per year    Active Member of [employer] or Organizations: Yes    Attends Banker Meetings: Never    Marital Status: Married  Catering manager Violence: Not At Risk (07/27/2022)   Humiliation, Afraid, Rape, and Kick questionnaire    Fear of Current or Ex-Partner: No    Emotionally Abused: No    Physically Abused: No    Sexually Abused: No     Constitutional: Denies fever, malaise, fatigue, headache or  abrupt weight changes.  HEENT: Pt reports ear pain. Denies eye pain, eye redness, ringing in the ears, wax buildup, runny nose, nasal congestion, bloody nose, or sore throat. Respiratory: Denies difficulty breathing, shortness of breath, cough or sputum production.   Cardiovascular: Denies chest pain, chest tightness, palpitations or swelling in the hands or feet.  Neurological: Denies dizziness, difficulty with memory, difficulty with speech or problems with balance and coordination.    No other specific complaints in a complete review of systems (except as listed in HPI above).  Objective:   Physical Exam   BP 128/74 (BP Location: Left Arm, Patient Position: Sitting, Cuff Size: Large)   Pulse 74   Temp (!) 95.8 F (35.4 C) (Temporal)   Wt (!) 301 lb (136.5 kg)   SpO2 98%   BMI 41.98 kg/m   Wt Readings from Last 3 Encounters:  03/22/23 297 lb (134.7 kg)  02/19/23 (!) 307 lb (139.3 kg)  01/05/23 (!) 305 lb 1.9 oz (138.4 kg)    General: Appears his stated age, obese, in NAD. HEENT: Head: normal shape and size; Ears: Bilateral cerumen impaction;  Neck: No adenopathy noted. Cardiovascular: Normal rate. Pulmonary/Chest: Normal effort. Neurological: Alert and oriented.      BMET    Component Value Date/Time   NA 137 12/22/2022 0855   NA 140 12/19/2019 0000   K 3.7 12/22/2022 0855   CL 100 12/22/2022 0855   CO2 30 12/22/2022 0855   GLUCOSE 119 (H) 12/22/2022 0855   BUN 27 (H) 12/22/2022 0855   BUN 27 (A) 12/19/2019 0000   CREATININE 1.00 12/22/2022 0855   CREATININE 1.12 07/01/2021 0809   CALCIUM 9.7 12/22/2022 0855  GFRNONAA >60 12/22/2022 0855    Lipid Panel     Component Value Date/Time   CHOL 122 07/01/2021 0809   TRIG 209 (H) 07/01/2021 0809   HDL 33 (L) 07/01/2021 0809   CHOLHDL 3.7 07/01/2021 0809   LDLCALC 62 07/01/2021 0809    CBC    Component Value Date/Time   WBC 6.4 12/22/2022 0855   RBC 5.66 12/22/2022 0855   HGB 15.5 12/22/2022 0855    HCT 47.6 12/22/2022 0855   PLT 168 12/22/2022 0855   MCV 84.1 12/22/2022 0855   MCH 27.4 12/22/2022 0855   MCHC 32.6 12/22/2022 0855   RDW 14.0 12/22/2022 0855   LYMPHSABS 2.1 12/22/2022 0855   MONOABS 0.5 12/22/2022 0855   EOSABS 0.2 12/22/2022 0855   BASOSABS 0.1 12/22/2022 0855    Hgb A1C Lab Results  Component Value Date   HGBA1C 6.8 (H) 12/22/2022           Assessment & Plan:    Right Otalgia secondary to Cerumen Impaction:  Manual lavage by CMA  post lavage ear exam reveals serous effusion on the right.  He has Flonase at home.  Advised him to use this twice daily for the next 5 days Can use Debrox 2 times weekly to prevent further wax buildup  Follow-up with your PCP as previously scheduled Nicki Reaper, NP

## 2023-05-17 NOTE — Patient Instructions (Signed)
Otitis Media, Adult  Otitis media is a condition in which the middle ear is red and swollen (inflamed) and full of fluid. The middle ear is the part of the ear that contains bones for hearing as well as air that helps send sounds to the brain. The condition usually goes away on its own. What are the causes? This condition is caused by a blockage in the eustachian tube. This tube connects the middle ear to the back of the nose. It normally allows air into the middle ear. The blockage is caused by fluid or swelling. Problems that can cause blockage include: A cold or infection that affects the nose, mouth, or throat. Allergies. An irritant, such as tobacco smoke. Adenoids that have become large. The adenoids are soft tissue located in the back of the throat, behind the nose and the roof of the mouth. Growth or swelling in the upper part of the throat, just behind the nose (nasopharynx). Damage to the ear caused by a change in pressure. This is called barotrauma. What increases the risk? You are more likely to develop this condition if you: Smoke or are exposed to tobacco smoke. Have an opening in the roof of your mouth (cleft palate). Have acid reflux. Have problems in your body's defense system (immune system). What are the signs or symptoms? Symptoms of this condition include: Ear pain. Fever. Problems with hearing. Being tired. Fluid leaking from the ear. Ringing in the ear. How is this treated? This condition can go away on its own within 3-5 days. But if the condition is caused by germs (bacteria) and does not go away on its own, or if it keeps coming back, your doctor may: Give you antibiotic medicines. Give you medicines for pain. Follow these instructions at home: Take over-the-counter and prescription medicines only as told by your doctor. If you were prescribed an antibiotic medicine, take it as told by your doctor. Do not stop taking it even if you start to feel better. Keep  all follow-up visits. Contact a doctor if: You have bleeding from your nose. There is a lump on your neck. You are not feeling better in 5 days. You feel worse instead of better. Get help right away if: You have pain that is not helped with medicine. You have swelling, redness, or pain around your ear. You get a stiff neck. You cannot move part of your face (paralysis). You notice that the bone behind your ear hurts when you touch it. You get a very bad headache. Summary Otitis media means that the middle ear is red, swollen, and full of fluid. This condition usually goes away on its own. If the problem does not go away, treatment may be needed. You may be given medicines to treat the infection or to treat your pain. If you were prescribed an antibiotic medicine, take it as told by your doctor. Do not stop taking it even if you start to feel better. Keep all follow-up visits. This information is not intended to replace advice given to you by your health care provider. Make sure you discuss any questions you have with your health care provider. Document Revised: 03/03/2021 Document Reviewed: 03/03/2021 Elsevier Patient Education  2024 [employer].

## 2023-05-19 ENCOUNTER — Encounter: Payer: Self-pay | Admitting: Physician Assistant

## 2023-05-19 ENCOUNTER — Ambulatory Visit: Payer: Medicare Other | Admitting: Physician Assistant

## 2023-05-19 ENCOUNTER — Other Ambulatory Visit: Payer: Self-pay

## 2023-05-19 VITALS — BP 131/83 | HR 76 | Temp 98.4°F | Ht 70.0 in | Wt 302.2 lb

## 2023-05-19 DIAGNOSIS — R1013 Epigastric pain: Secondary | ICD-10-CM

## 2023-05-19 DIAGNOSIS — R197 Diarrhea, unspecified: Secondary | ICD-10-CM

## 2023-05-19 DIAGNOSIS — R140 Abdominal distension (gaseous): Secondary | ICD-10-CM | POA: Diagnosis not present

## 2023-05-19 DIAGNOSIS — K219 Gastro-esophageal reflux disease without esophagitis: Secondary | ICD-10-CM | POA: Diagnosis not present

## 2023-05-19 NOTE — Progress Notes (Signed)
Eric Amy, PA-C [address]  [address]  [place], [place] [address]  Main: [phone]  Fax: [phone]   Gastroenterology Consultation  Referring Provider:     Saralyn Pilar * Primary Care Physician:  Smitty Cords, DO Primary Gastroenterologist:  Eric Amy, PA-C / Dr. Wyline Mood  Reason for Consultation:     Upset Stomach        HPI:   Eric Andrews is a 72 y.o. y/o male referred for consultation & management  by Smitty Cords, DO.    He presents for evaluation of upset stomach.  He has history of GERD.  No previous EGD.  He takes multiple medications including Farxiga and Ozempic.  He has been on these medicines for several years.  No new medications.  Patient states he has had increased GI symptoms for the past 3 months.  He reports gas, bloating, increased bowel sounds.  A few weeks ago he had episode of diarrhea which lasted 7 to 10 days and resolved.  He has had no more diarrhea in the past 2 weeks.  Stools are back to normal.  Denies fever, chills, nausea, vomiting, melena, hematochezia, or weight loss.  He states he has history of sensitive stomach all of his life.  He reports episodes of epigastric pain with increased belching and indigestion.  He has recently taken omeprazole 20 mg once daily in the morning, Tums 2 tablets 3 times daily as needed, and Gas-X twice daily.  Is currently on doxycycline for the past week for a scalp infection.  No recent travel.  Colonoscopy 03/2023 by Dr. Tobi Bastos showed 4 small polyps (tubular adenomas and hyperplastic) removed, good prep, sigmoid diverticulosis.   Past Medical History:  Diagnosis Date   ADD (attention deficit disorder)    Anemia    Anxiety    Arthritis    Depression    Diabetes mellitus without complication (HCC)    Enlarged prostate    GERD (gastroesophageal reflux disease)    History of kidney stones    Hypertension    Knee pain    Neuromuscular disorder (HCC)    lower legs-  neuropathy   Sleep apnea     Past Surgical History:  Procedure Laterality Date   APPENDECTOMY     CARPEL TUNEL Bilateral    COLONOSCOPY W/ POLYPECTOMY     COLONOSCOPY WITH PROPOFOL N/A 03/22/2023   Procedure: COLONOSCOPY WITH PROPOFOL;  Surgeon: Wyline Mood, MD;  Location[facility] END[facility]COPY;  Service: Gastroenterology;  Laterality: N/A;   HERNIA REPAIR     Left   JOINT REPLACEMENT     KNEE ARTHROSCOPY     Left   RIGHT WRIST SX X2     TOTAL KNEE ARTHROPLASTY Left 04/21/2022   Procedure: TOTAL KNEE ARTHROPLASTY;  Surgeon: Kennedy Bucker, MD;  Location: [facility] ORS;  Service: Orthopedics;  Laterality: Left;   TOTAL SHOULDER ARTHROPLASTY Left 01/05/2023   Procedure: Left total shoulder arthroplasty, biceps tenodesis;  Surgeon: Signa Kell, MD;  Location: [facility] ORS;  Service: Orthopedics;  Laterality: Left;   vascetomy      Prior to Admission medications   Medication Sig Start Date End Date Taking? Authorizing Provider  acetaminophen (TYLENOL) 500 MG tablet Take 2 tablets (1,000 mg total) by mouth every 8 (eight) hours. 01/05/23 01/05/24  Signa Kell, MD  albuterol (VENTOLIN HFA) 108 (90 Base) MCG/ACT inhaler Inhale 2 puffs into the lungs every 6 (six) hours as needed for wheezing or shortness of breath. Patient not taking: Reported  on 02/19/2023 11/23/22   Smitty Cords, DO  Ascorbic Acid (VITAMIN C) 1000 MG tablet Take 1,000 mg by mouth daily.    [provider]  atorvastatin (LIPITOR) 20 MG tablet Take 1 tablet (20 mg total) by mouth at bedtime. 08/11/22   Karamalegos, Netta Neat, DO  Bacillus Coagulans-Inulin (PROBIOTIC) 1-250 BILLION-MG CAPS Take 1 capsule by mouth daily. 07/30/21   Ward, Layla Maw, DO  busPIRone (BUSPAR) 7.5 MG tablet Take 7.5 mg by mouth 2 (two) times daily. 03/23/22   [provider]  celecoxib (CELEBREX) 200 MG capsule Take 1 capsule (200 mg total) by mouth 2 (two) times daily. 03/01/23   Karamalegos, Netta Neat, DO  cetirizine (ZYRTEC) 10 MG  tablet TAKE 1 TABLET(10 MG) BY MOUTH AT BEDTIME 08/11/22   Karamalegos, Netta Neat, DO  chlorthalidone (HYGROTON) 25 MG tablet Take 1 tablet (25 mg total) by mouth daily. 08/11/22   Karamalegos, Netta Neat, DO  cholecalciferol (VITAMIN D3) 25 MCG (1000 UNIT) tablet Take 1,000 Units by mouth in the morning and at bedtime.    [provider]  Ciclopirox 1 % shampoo apply topically, massage into scalp and leave in for 5 -10 minutes before rinsing out 05/12/23   Willeen Niece, MD  clobetasol (TEMOVATE) 0.05 % external solution Apply topically a few drops to affected itchy areas on scalp and ears as needed. Avoid applying to face, groin, and axilla. Use as directed. 05/12/23   Willeen Niece, MD  doxycycline (MONODOX) 100 MG capsule Take 1 capsule (100 mg total) by mouth daily. Take with food and drink 05/12/23   Willeen Niece, MD  FARXIGA 10 MG TABS tablet Take 1 tablet (10 mg total) by mouth daily before breakfast. 10/14/22   Althea Charon, Netta Neat, DO  fenofibrate micronized (LOFIBRA) 134 MG capsule Take 1 capsule (134 mg total) by mouth daily before breakfast. TAKE 1 CAPSULE(134 MG) BY MOUTH DAILY BEFORE BREAKFAST 08/11/22   Karamalegos, Netta Neat, DO  finasteride (PROSCAR) 5 MG tablet TAKE 1 TABLET(5 MG) BY MOUTH EVERY EVENING 08/11/22   Karamalegos, Netta Neat, DO  fluticasone (FLONASE) 50 MCG/ACT nasal spray INSTILL 2 SPRAYS INTO BOTH NOSTRILS EVERY DAY 04/06/23   Karamalegos, Netta Neat, DO  gabapentin (NEURONTIN) 600 MG tablet Take 600 mg by mouth in the morning, at noon, in the evening, and at bedtime. 10/21/21 01/05/23  [provider]  hydrocortisone 2.5 % cream APPLY TOPICALLY TO THE AFFECTED AREA OF FACE AND EARS 3 days a week Monday, Wednesday, Friday 01/07/23   Deirdre Evener, MD  insulin degludec (TRESIBA FLEXTOUCH) 100 UNIT/ML FlexTouch Pen Inject 68 Units into the skin at bedtime. Patient taking differently: Inject 60 Units into the skin every evening. 01/29/22   Althea Charon,  Netta Neat, DO  Insulin Pen Needle 31G X 5 MM MISC Use with insulin to inject into skin 3 times daily as directed 10/10/20   Althea Charon, Netta Neat, DO  ketoconazole (NIZORAL) 2 % cream APPLY EXTERNALLY TO THE AFFECTED AREA OF FACE AND EARS 3 days a week on TUESDAY, THURSDAY, AND SATURDAY 03/31/23   Deirdre Evener, MD  losartan (COZAAR) 50 MG tablet Take 1 tablet (50 mg total) by mouth daily. 08/11/22   Karamalegos, Netta Neat, DO  metFORMIN (GLUCOPHAGE-XR) 500 MG 24 hr tablet Take 2 tablets (1,000 mg total) by mouth 2 (two) times daily with a meal. Add refills for future 08/11/22   Smitty Cords, DO  Multiple Vitamin (MULTIVITAMIN) capsule Take 1 capsule by mouth daily.  [provider]  NOVOLOG FLEXPEN 100 UNIT/ML FlexPen Inject 14 Units into the skin 3 (three) times daily with meals. Sliding scale 03/11/21   Karamalegos, Netta Neat, DO  Omega-3 Fatty Acids (FISH OIL) 1200 MG CAPS Take 1,200 mg by mouth in the morning and at bedtime.    [provider]  ondansetron (ZOFRAN-ODT) 4 MG disintegrating tablet Take 1 tablet (4 mg total) by mouth every 8 (eight) hours as needed for nausea or vomiting. 01/05/23   Signa Kell, MD  OZEMPIC, 1 MG/DOSE, 4 MG/3ML SOPN Inject 1 mg into the skin once a week. 01/29/22   Karamalegos, Netta Neat, DO  pioglitazone (ACTOS) 15 MG tablet Take 1 tablet (15 mg total) by mouth daily. OFFICE VISIT NEEDED FOR ADDITIONAL REFILLS, CALL OFFICE TO SCHEDULE 12/09/22   Karamalegos, Netta Neat, DO  QUEtiapine (SEROQUEL) 25 MG tablet Take 25-50 mg by mouth at bedtime.    [provider]  tacrolimus (PROTOPIC) 0.1 % ointment Apply to aa of face twice daily as needed for itchy rash 05/12/23   Willeen Niece, MD  tamsulosin (FLOMAX) 0.4 MG CAPS capsule TAKE 1 CAPSULE BY MOUTH EVERY NIGHT AT BEDTIME FOR URINE RETENTION 08/11/22   Althea Charon, Netta Neat, DO  traMADol (ULTRAM) 50 MG tablet Take 50 mg by mouth 3 (three) times daily as needed.    [provider]  traZODone (DESYREL) 100 MG tablet Take 1.5 tablets (150 mg total) by mouth at bedtime. Patient taking differently: Take 200 mg by mouth at bedtime. 08/11/22   Althea Charon, Netta Neat, DO  venlafaxine XR (EFFEXOR-XR) 150 MG 24 hr capsule Take 1 capsule (150 mg total) by mouth daily with breakfast. 08/11/22   Smitty Cords, DO    Family History  Problem Relation Age of Onset   Heart attack Mother 29   Lung cancer Father    Alzheimer's disease Brother    Heart attack Maternal Grandfather 64     Social History   Tobacco Use   Smoking status: Never   Smokeless tobacco: Never  Vaping Use   Vaping Use: Never used  Substance Use Topics   Alcohol use: Not Currently   Drug use: Never    Allergies as of 05/19/2023 - Review Complete 05/17/2023  Allergen Reaction Noted   Dust mite extract  05/27/2020    Review of Systems:    All systems reviewed and negative except where noted in HPI.   Physical Exam:  There were no vitals taken for this visit. No LMP for male patient. Psych:  Alert and cooperative. Normal mood and affect. General:   Alert,  Well-developed, well-nourished, pleasant and cooperative in NAD Head:  Normocephalic and atraumatic. Eyes:  Sclera clear, no icterus.   Conjunctiva pink. Neck:  Supple; no masses or thyromegaly. Lungs:  Respirations even and unlabored.  Clear throughout to auscultation.   No wheezes, crackles, or rhonchi. No acute distress. Heart:  Regular rate and rhythm; no murmurs, clicks, rubs, or gallops. Abdomen:  Normal bowel sounds.  No bruits.  Soft, obese, and non-distended without masses, hepatosplenomegaly or hernias noted.  Some epigastric and supraumbilical abdominal tenderness tenderness.  No RUQ or lower abdominal tenderness.  No guarding or rebound tenderness.    Neurologic:  Alert and oriented x3;  grossly normal neurologically. Psych:  Alert and cooperative. Normal mood and affect.  Imaging Studies: No results  found.  Assessment and Plan:   Jevaughn Degollado is a 72 y.o. y/o male has been referred for:  Epigastric pain  Scheduling EGD I discussed risks of EGD with patient to include risk of bleeding, perforation, and risk of sedation.   Patient expressed understanding and agrees to proceed with EGD.   Labs:  CBC, CMP, Lipase   H. Pylori Breath Test  GERD  Continue omeprazole 20 Mg once daily every morning and Tums as needed.  Add OTC Pepcid 20 mg twice daily.  Gas / bloating  Continue Gas-X as needed.  Recent diarrhea, suspect gastroenteritis which has resolved.  If he has recurrent diarrhea, then I will order stool studies.  Follow up in 1 month after EGD procedure.  Eric Amy, PA-C

## 2023-05-20 ENCOUNTER — Ambulatory Visit: Payer: Self-pay

## 2023-05-20 ENCOUNTER — Encounter: Payer: Self-pay | Admitting: Family Medicine

## 2023-05-20 ENCOUNTER — Ambulatory Visit (INDEPENDENT_AMBULATORY_CARE_PROVIDER_SITE_OTHER): Payer: Medicare Other | Admitting: Family Medicine

## 2023-05-20 VITALS — BP 122/74 | HR 74 | Temp 98.5°F | Resp 20 | Ht 70.0 in | Wt 302.4 lb

## 2023-05-20 DIAGNOSIS — H9201 Otalgia, right ear: Secondary | ICD-10-CM

## 2023-05-20 DIAGNOSIS — H6993 Unspecified Eustachian tube disorder, bilateral: Secondary | ICD-10-CM

## 2023-05-20 DIAGNOSIS — J029 Acute pharyngitis, unspecified: Secondary | ICD-10-CM | POA: Diagnosis not present

## 2023-05-20 DIAGNOSIS — H65491 Other chronic nonsuppurative otitis media, right ear: Secondary | ICD-10-CM | POA: Diagnosis not present

## 2023-05-20 LAB — CBC WITH DIFFERENTIAL
Basos: 1 %
Eos: 5 %
Hemoglobin: 15 g/dL (ref 13.0–17.7)
Lymphs: 36 %
MCHC: 32 g/dL (ref 31.5–35.7)
MCV: 84 fL (ref 79–97)
Monocytes: 9 %
Neutrophils: 49 %
RBC: 5.57 x10E6/uL (ref 4.14–5.80)
RDW: 13.6 % (ref 11.6–15.4)

## 2023-05-20 LAB — LIPASE: Lipase: 17 U/L (ref 13–78)

## 2023-05-20 LAB — COMPREHENSIVE METABOLIC PANEL
Alkaline Phosphatase: 40 IU/L — ABNORMAL LOW (ref 44–121)
Total Protein: 6.6 g/dL (ref 6.0–8.5)
eGFR: 80 mL/min/{1.73_m2} (ref >59)

## 2023-05-20 MED ORDER — PREDNISONE 10 MG PO TABS
ORAL_TABLET | ORAL | 0 refills | Status: DC
Start: 2023-05-20 — End: 2023-07-30

## 2023-05-20 MED ORDER — AMOXICILLIN-POT CLAVULANATE 875-125 MG PO TABS
1.0000 | ORAL_TABLET | Freq: Two times a day (BID) | ORAL | 0 refills | Status: DC
Start: 2023-05-20 — End: 2023-08-17

## 2023-05-20 NOTE — Patient Instructions (Addendum)
Thank you for coming to the office today.  Fluid behind R ear drum. No ear infection  Possible throat infection pharyngitis  PAUSE Doxycycline for 7 days while we take the Augmentin antibiotic.  Start Augmentin twice a day for 7 days.  Keep on Flonase 2 sprays once daily each side. It may take a few weeks to take effect.  If pain is significant, you can take the Prednisone 5 day taper. Take a dose daily with meal. Start with 4 tablets (40mg ) for 2 days, then 3 tab (30mg ) for 1 day, then 2 tab (20mg ) for 1 day, then 1 tab (10mg ) for 1 day.   Consider Sudafed for ear pain pressure.  [facility] [address] [place], [place] [address] Phone: [phone]  [facility] [address]  [place], [place] [address] Ph: [phone]      Please schedule a Follow-up Appointment to: Return if symptoms worsen or fail to improve.  If you have any other questions or concerns, please feel free to call the office or send a message through MyChart. You may also schedule an earlier appointment if necessary.  Additionally, you may be receiving a survey about your experience at our office within a few days to 1 week by e-mail or mail. We value your feedback.  Saralyn Pilar, DO [facility], [place]

## 2023-05-20 NOTE — Telephone Encounter (Signed)
     Chief Complaint: Continued right ear pain, seen in office 05/17/23. Flonase is not helping. Now has throat pain. Tramadol not helping pain. Symptoms: Above Frequency: 4 days ago Pertinent Negatives: Patient denies  Disposition: [] ED /[] Urgent Care (no appt availability in office) / [x] Appointment(In office/virtual)/ []  [facility]/ [] Home Care/ [] Refused Recommended Disposition /[] [facility]/ []  Follow-up with PCP Additional Notes:   Reason for Disposition  [1] SEVERE pain AND [2] not improved 2 hours after taking analgesic medication (e.g., ibuprofen or acetaminophen)  Answer Assessment - Initial Assessment Questions 1. LOCATION: "Which ear is involved?"     Right ear 2. ONSET: "When did the ear start hurting"      4 days  3. SEVERITY: "How bad is the pain?"  (Scale 1-10; mild, moderate or severe)   - MILD (1-3): doesn't interfere with normal activities    - MODERATE (4-7): interferes with normal activities or awakens from sleep    - SEVERE (8-10): excruciating pain, unable to do any normal activities      Severe 4. URI SYMPTOMS: "Do you have a runny nose or cough?"     Throat pain 5. FEVER: "Do you have a fever?" If Yes, ask: "What is your temperature, how was it measured, and when did it start?"     No 6. CAUSE: "Have you been swimming recently?", "How often do you use Q-TIPS?", "Have you had any recent air travel or scuba diving?"     No 7. OTHER SYMPTOMS: "Do you have any other symptoms?" (e.g., headache, stiff neck, dizziness, vomiting, runny nose, decreased hearing)     No 8. PREGNANCY: "Is there any chance you are pregnant?" "When was your last menstrual period?"     N/a  Protocols used: Davina Poke

## 2023-05-20 NOTE — Progress Notes (Signed)
Subjective:    Patient ID: Eric Andrews, male    DOB: Apr 11, 1951, 72 y.o.   MRN: [id number]  Eric Andrews is a 72 y.o. male presenting on 05/20/2023 for Ear Pain (Persistent Rt ear pain x 1 week.  He describe the pain as fullness.  He has not noticed any decreased hearing or drainage from the ear.)   HPI  Right Ear Pain Eustachian Tube Dysfunction, effusion  Last visit here 05/17/23 with Nicki Reaper, FNP for same issue Right ear pain. They identified some cerumen wax impaction bilateral and did flushing at that time with ear wax removal. No sign of infection at that time but had some fluid behind ear drum.  He used Flonase regularly as well.  Now has some sore throat on R side only. Not left side  Last ENT in [place], they did a tymp tube drainage R side Has not seen ENT locally    Seborrheic Dermatitis He is followed by Dermatology - [facility]. Treated with Doxycycline course, and Tacrolimus ointment, Ciclopirox, Clobetasol solution      11/23/2022   11:45 AM 08/11/2022    3:21 PM 07/27/2022    1:35 PM  Depression screen PHQ 2/9  Decreased Interest 2 0 0  Down, Depressed, Hopeless 1 1 1   PHQ - 2 Score 3 1 1   Altered sleeping 1 2 3   Tired, decreased energy 1 2 0  Change in appetite 3 3 0  Feeling bad or failure about yourself  1 1 3   Trouble concentrating 3 1 3   Moving slowly or fidgety/restless 1 0 0  Suicidal thoughts 0 0 0  PHQ-9 Score 13 10 10   Difficult doing work/chores Somewhat difficult Not difficult at all Not difficult at all    Social History   Tobacco Use   Smoking status: Never   Smokeless tobacco: Never  Vaping Use   Vaping Use: Never used  Substance Use Topics   Alcohol use: Not Currently   Drug use: Never    Review of Systems Per HPI unless specifically indicated above     Objective:    BP 122/74 (BP Location: Left Arm, Patient Position: Sitting, Cuff Size: Large)   Pulse 74   Temp 98.5 F (36.9 C) (Oral)   Resp 20   Ht 5'  10" (1.778 m)   Wt (!) 302 lb 6.4 oz (137.2 kg)   SpO2 98%   BMI 43.39 kg/m   Wt Readings from Last 3 Encounters:  05/20/23 (!) 302 lb 6.4 oz (137.2 kg)  05/19/23 (!) 302 lb 3.2 oz (137.1 kg)  05/17/23 (!) 301 lb (136.5 kg)    Physical Exam Vitals and nursing note reviewed.  Constitutional:      General: He is not in acute distress.    Appearance: Normal appearance. He is well-developed. He is not diaphoretic.     Comments: Well-appearing, comfortable, cooperative  HENT:     Head: Normocephalic and atraumatic.     Right Ear: Ear canal and external ear normal. There is no impacted cerumen.     Left Ear: Ear canal and external ear normal. There is no impacted cerumen.     Ears:     Comments: R TM with hazy appearance effusion. No erythema L TM with slight hazy less fullness, some scar tissue Eyes:     General:        Right eye: No discharge.        Left eye: No discharge.  Conjunctiva/sclera: Conjunctivae normal.  Cardiovascular:     Rate and Rhythm: Normal rate.  Pulmonary:     Effort: Pulmonary effort is normal.  Skin:    General: Skin is warm and dry.     Findings: No erythema or rash.  Neurological:     Mental Status: He is alert and oriented to person, place, and time.  Psychiatric:        Mood and Affect: Mood normal.        Behavior: Behavior normal.        Thought Content: Thought content normal.     Comments: Well groomed, good eye contact, normal speech and thoughts      Results for orders placed or performed in visit on 05/19/23  H. pylori breath test  Result Value Ref Range   H pylori Breath Test WILL FOLLOW   CBC With Differential  Result Value Ref Range   WBC 5.9 3.4 - 10.8 x10E3/uL   RBC 5.57 4.14 - 5.80 x10E6/uL   Hemoglobin 15.0 13.0 - 17.7 g/dL   Hematocrit 40.9 81.1 - 51.0 %   MCV 84 79 - 97 fL   MCH 26.9 26.6 - 33.0 pg   MCHC 32.0 31.5 - 35.7 g/dL   RDW 91.4 78.2 - 95.6 %   Neutrophils 49 Not Estab. %   Lymphs 36 Not Estab. %    Monocytes 9 Not Estab. %   Eos 5 Not Estab. %   Basos 1 Not Estab. %   Neutrophils Absolute 2.9 1.4 - 7.0 x10E3/uL   Lymphocytes Absolute 2.1 0.7 - 3.1 x10E3/uL   Monocytes Absolute 0.5 0.1 - 0.9 x10E3/uL   EOS (ABSOLUTE) 0.3 0.0 - 0.4 x10E3/uL   Basophils Absolute 0.1 0.0 - 0.2 x10E3/uL   Immature Granulocytes 0 Not Estab. %   Immature Grans (Abs) 0.0 0.0 - 0.1 x10E3/uL  Comprehensive metabolic panel  Result Value Ref Range   Glucose 163 (H) 70 - 99 mg/dL   BUN 20 8 - 27 mg/dL   Creatinine, Ser 2.13 0.76 - 1.27 mg/dL   eGFR 80 >08 MV/HQI/6.96   BUN/Creatinine Ratio 20 10 - 24   Sodium 141 134 - 144 mmol/L   Potassium 4.3 3.5 - 5.2 mmol/L   Chloride 103 96 - 106 mmol/L   CO2 24 20 - 29 mmol/L   Calcium 9.5 8.6 - 10.2 mg/dL   Total Protein 6.6 6.0 - 8.5 g/dL   Albumin 4.3 3.8 - 4.8 g/dL   Globulin, Total 2.3 1.5 - 4.5 g/dL   Albumin/Globulin Ratio 1.9    Bilirubin Total 0.4 0.0 - 1.2 mg/dL   Alkaline Phosphatase 40 (L) 44 - 121 IU/L   AST 39 0 - 40 IU/L   ALT 47 (H) 0 - 44 IU/L  Lipase  Result Value Ref Range   Lipase 17 13 - 78 U/L      Assessment & Plan:   Problem List Items Addressed This Visit   None Visit Diagnoses     Otalgia of right ear    -  Primary   Relevant Medications   predniSONE (DELTASONE) 10 MG tablet   Other Relevant Orders   Ambulatory referral to ENT   Eustachian tube dysfunction, bilateral       Relevant Medications   predniSONE (DELTASONE) 10 MG tablet   Other Relevant Orders   Ambulatory referral to ENT   Chronic otitis media of right ear with effusion       Relevant Medications  amoxicillin-clavulanate (AUGMENTIN) 875-125 MG tablet   Other Relevant Orders   Ambulatory referral to ENT   Pharyngitis, unspecified etiology       Relevant Medications   amoxicillin-clavulanate (AUGMENTIN) 875-125 MG tablet       Clinically with R ear effusion, eustachian tube dysfunction. No sign of infection. Oropharynx with R tonsillar region erythema  some possible tonsillith vs exudate Possible throat infection pharyngitis  PAUSE Doxycycline for 7 days while we take the Augmentin antibiotic.  Start Augmentin twice a day for 7 days.  Keep on Flonase 2 sprays once daily each side. It may take a few weeks to take effect.  If pain is significant, you can take the Prednisone 5 day taper. Take a dose daily with meal. Start with 4 tablets (40mg ) for 2 days, then 3 tab (30mg ) for 1 day, then 2 tab (20mg ) for 1 day, then 1 tab (10mg ) for 1 day.  Trial on lower dose, caution with DM hyperglycemia risk  Consider Sudafed for ear pain pressure.  Referral to [facility]  Orders Placed This Encounter  Procedures   Ambulatory referral to ENT    Referral Priority:   Routine    Referral Type:   Consultation    Referral Reason:   Specialty Services Required    Requested Specialty:   Otolaryngology    Number of Visits Requested:   1    Meds ordered this encounter  Medications   amoxicillin-clavulanate (AUGMENTIN) 875-125 MG tablet    Sig: Take 1 tablet by mouth 2 (two) times daily.    Dispense:  14 tablet    Refill:  0   predniSONE (DELTASONE) 10 MG tablet    Sig: Take a dose daily with meal. Start with 4 tablets (40mg ) for 2 days, then 3 tab (30mg ) for 1 day, then 2 tab (20mg ) for 1 day, then 1 tab (10mg ) for 1 day.    Dispense:  14 tablet    Refill:  0      Follow up plan: Return if symptoms worsen or fail to improve.  Saralyn Pilar, DO [facility] [place] [facility] 05/20/2023, 10:49 AM

## 2023-05-21 ENCOUNTER — Telehealth: Payer: Self-pay

## 2023-05-21 LAB — CBC WITH DIFFERENTIAL
Basophils Absolute: 0.1 10*3/uL (ref 0.0–0.2)
EOS (ABSOLUTE): 0.3 10*3/uL (ref 0.0–0.4)
Hematocrit: 46.9 % (ref 37.5–51.0)
Immature Grans (Abs): 0 10*3/uL (ref 0.0–0.1)
Immature Granulocytes: 0 %
Lymphocytes Absolute: 2.1 10*3/uL (ref 0.7–3.1)
MCH: 26.9 pg (ref 26.6–33.0)
Monocytes Absolute: 0.5 10*3/uL (ref 0.1–0.9)
Neutrophils Absolute: 2.9 10*3/uL (ref 1.4–7.0)
WBC: 5.9 10*3/uL (ref 3.4–10.8)

## 2023-05-21 LAB — COMPREHENSIVE METABOLIC PANEL
ALT: 47 IU/L — ABNORMAL HIGH (ref 0–44)
AST: 39 IU/L (ref 0–40)
Albumin/Globulin Ratio: 1.9
Albumin: 4.3 g/dL (ref 3.8–4.8)
BUN/Creatinine Ratio: 20 (ref 10–24)
BUN: 20 mg/dL (ref 8–27)
Bilirubin Total: 0.4 mg/dL (ref 0.0–1.2)
CO2: 24 mmol/L (ref 20–29)
Calcium: 9.5 mg/dL (ref 8.6–10.2)
Chloride: 103 mmol/L (ref 96–106)
Creatinine, Ser: 1 mg/dL (ref 0.76–1.27)
Globulin, Total: 2.3 g/dL (ref 1.5–4.5)
Glucose: 163 mg/dL — ABNORMAL HIGH (ref 70–99)
Potassium: 4.3 mmol/L (ref 3.5–5.2)
Sodium: 141 mmol/L (ref 134–144)

## 2023-05-21 LAB — H. PYLORI BREATH TEST: H pylori Breath Test: NEGATIVE

## 2023-05-21 NOTE — Telephone Encounter (Signed)
Patient  notified.   Notify patient lab results showed NORMAL kidney test, pancreas test, hemoglobin, and white count.  Blood sugar elevated at 163.  Continue diabetic medication and low sugar diet.  ALT liver test was slightly elevated, yet improved from previous labs, not worrisome.  Continue low-fat diet and exercise to treat fatty liver disease (seen on CT in 2022).  Continue with plan for EGD as scheduled.

## 2023-05-21 NOTE — Progress Notes (Signed)
Notify patient lab results showed NORMAL kidney test, pancreas test, hemoglobin, and white count.  Blood sugar elevated at 163.  Continue diabetic medication and low sugar diet.  ALT liver test was slightly elevated, yet improved from previous labs, not worrisome.  Continue low-fat diet and exercise to treat fatty liver disease (seen on CT in 2022).  Continue with plan for EGD as scheduled.

## 2023-05-26 ENCOUNTER — Ambulatory Visit: Payer: Medicare Other | Admitting: Dermatology

## 2023-05-26 DIAGNOSIS — L219 Seborrheic dermatitis, unspecified: Secondary | ICD-10-CM

## 2023-05-26 DIAGNOSIS — B029 Zoster without complications: Secondary | ICD-10-CM

## 2023-05-26 MED ORDER — CLINDAMYCIN PHOSPHATE 1 % EX LOTN: TOPICAL_LOTION | Freq: Every day | CUTANEOUS | 2 refills | Status: DC

## 2023-05-26 NOTE — Progress Notes (Addendum)
Follow-Up Visit   Subjective  Eric Andrews is a 72 y.o. male who presents for the following: seb derm follow up. Patient advises the same day he was here he developed an earache. He went to the dr 1 week later and they said he had water that would not drain from his ears. He was switched to amoxicillin so he only took doxycycline for 1 week. Patient advises scalp rash has improved but he does have a spot at scalp that is sensitive and some itching. The scabs in his scalp have healed. Patient is using ciclopirox shampoo every other day and clobetasol solution every day. He did not get the tacrolimus.  Patient c/o tingling and sensitivity at right eyebrow, said he feels like sometimes there is a bug walking on the area. No hx of fever blisters.    The following portions of the chart were reviewed this encounter and updated as appropriate: medications, allergies, medical history  Review of Systems:  No other skin or systemic complaints except as noted in HPI or Assessment and Plan.  Objective  Well appearing patient in no apparent distress; mood and affect are within normal limits.   A focused examination was performed of the following areas: Scalp, face, ears  Relevant exam findings are noted in the Assessment and Plan.     Assessment & Plan   Suspicious for resolved shingles with new h/o parethesias and ear pain (post herpetic neuralgia).  Initially presented with intense itch scalp with crusted sores and R upper eyelid edema.  Has new dried heme crusted lesion R lateral eye.  Last visit treated for SEBORRHEIC DERMATITIS vs folliculitis with pruritus.  Pt has h/o seborrheic dermatitis since 2022.  Exam: erythema at right scalp, areas of perifollicular pigmentation- no open sores, Heme crusted punched out macule at R lateral eye  Chronic and persistent condition with duration or expected duration over one year. Condition is symptomatic/ bothersome to patient. Not currently at goal  but improving. Scalp rash has cleared.  Seborrheic Dermatitis is a chronic persistent rash characterized by pinkness and scaling most commonly of the mid face but also can occur on the scalp (dandruff), ears; mid chest, mid back and groin.  It tends to be exacerbated by stress and cooler weather.  People who have neurologic disease may experience new onset or exacerbation of existing seborrheic dermatitis.  The condition is not curable but treatable and can be controlled.  Treatment Plan for Seb Derm/Folliculitis: Restart doxycycline 100 mg once daily with food once course of amoxicillin completed.  Once course of doxycycline complete (~1 mo), start clindamycin solution once daily to affected areas at scalp.  Continue ciclopirox 0.77% shampoo massage into scalp and leave in for 5-10 minutes before rinsing out Continue clobetasol 0.05% solution applying a few drops daily to affected areas at scalp as needed for itch. Avoid applying to face, groin, and axilla. Use as directed. Long-term use can cause thinning of the skin. Continue ketoconazole cream 1-2 times daily to face and ears as needed. Consider low dose doxycycline if not improved. Patient will let us know via MyChart if flares once course of doxycycline 100 mg daily is complete and we can send in the lower dose for patient to take daily.   Since his current symptoms are suspicious for shingles and he has improved, he may choose not to take the oral doxycycline and only take if needed.  Seborrheic dermatitis  Related Medications hydrocortisone 2.5 % cream APPLY TOPICALLY TO THE AFFECTED  AREA OF FACE AND EARS 3 days a week Monday, Wednesday, Friday  ketoconazole (NIZORAL) 2 % cream APPLY EXTERNALLY TO THE AFFECTED AREA OF FACE AND EARS 3 days a week on TUESDAY, THURSDAY, AND SATURDAY  tacrolimus (PROTOPIC) 0.1 % ointment Apply to aa of face twice daily as needed for itchy rash  clobetasol (TEMOVATE) 0.05 % external solution Apply  topically a few drops to affected itchy areas on scalp and ears as needed. Avoid applying to face, groin, and axilla. Use as directed.  Ciclopirox 1 % shampoo apply topically, massage into scalp and leave in for 5 -10 minutes before rinsing out    Return if symptoms worsen or fail to improve.  Anise Salvo, RMA, am acting as scribe for Willeen Niece, MD .   Documentation: I have reviewed the above documentation for accuracy and completeness, and I agree with the above.  Willeen Niece, MD

## 2023-05-26 NOTE — Patient Instructions (Addendum)
Restart doxycycline 100 mg once daily with food once course of amoxicillin completed.  Once course of doxycycline complete, start clindamycin solution once daily to affected areas at scalp.  Continue ciclopirox 0.77% shampoo massage into scalp and leave in for 5-10 minutes before rinsing out Continue clobetasol 0.05% solution applying a few drops daily to affected areas at scalp as needed for itch. Avoid applying to face, groin, and axilla. Use as directed. Long-term use can cause thinning of the skin. Continue ketoconazole cream 1-2 times daily to face and ears as needed.  Topical steroids (such as triamcinolone, fluocinolone, fluocinonide, mometasone, clobetasol, halobetasol, betamethasone, hydrocortisone) can cause thinning and lightening of the skin if they are used for too long in the same area. Your physician has selected the right strength medicine for your problem and area affected on the body. Please use your medication only as directed by your physician to prevent side effects.   Due to recent changes in healthcare laws, you may see results of your pathology and/or laboratory studies on MyChart before the doctors have had a chance to review them. We understand that in some cases there may be results that are confusing or concerning to you. Please understand that not all results are received at the same time and often the doctors may need to interpret multiple results in order to provide you with the best plan of care or course of treatment. Therefore, we ask that you please give [place] 2 business days to thoroughly review all your results before contacting the office for clarification. Should we see a critical lab result, you will be contacted sooner.   If You Need Anything After Your Visit  If you have any questions or concerns for your doctor, please call our main line at [phone] and press option 4 to reach your doctor's medical assistant. If no one answers, please leave a voicemail as  directed and we will return your call as soon as possible. Messages left after 4 pm will be answered the following business day.   You may also send [place] a message via MyChart. We typically respond to MyChart messages within 1-2 business days.  For prescription refills, please ask your pharmacy to contact our office. Our fax number is [phone].  If you have an urgent issue when the clinic is closed that cannot wait until the next business day, you can page your doctor at the number below.    Please note that while we do our best to be available for urgent issues outside of office hours, we are not available 24/7.   If you have an urgent issue and are unable to reach [place], you may choose to seek medical care at your doctor's office, retail clinic, urgent care center, or emergency room.  If you have a medical emergency, please immediately call 911 or go to the emergency department.  Pager Numbers  - Dr. Gwen Pounds: [phone]  - Dr. Neale Burly: [phone]  - Dr. Roseanne Reno: [phone]  In the event of inclement weather, please call our main line at [phone] for an update on the status of any delays or closures.  Dermatology Medication Tips: Please keep the boxes that topical medications come in in order to help keep track of the instructions about where and how to use these. Pharmacies typically print the medication instructions only on the boxes and not directly on the medication tubes.   If your medication is too expensive, please contact our office at [phone] option 4 or send [place] a message  through MyChart.   We are unable to tell what your co-pay for medications will be in advance as this is different depending on your insurance coverage. However, we may be able to find a substitute medication at lower cost or fill out paperwork to get insurance to cover a needed medication.   If a prior authorization is required to get your medication covered by your insurance company, please  allow [place] 1-2 business days to complete this process.  Drug prices often vary depending on where the prescription is filled and some pharmacies may offer cheaper prices.  The website www.goodrx.com contains coupons for medications through different pharmacies. The prices here do not account for what the cost may be with help from insurance (it may be cheaper with your insurance), but the website can give you the price if you did not use any insurance.  - You can print the associated coupon and take it with your prescription to the pharmacy.  - You may also stop by our office during regular business hours and pick up a GoodRx coupon card.  - If you need your prescription sent electronically to a different pharmacy, notify our office through [facility] or by phone at [phone] option 4.     Si Usted Necesita Algo Despus de Su Visita  Tambin puede enviarnos un mensaje a travs de Clinical cytogeneticist. Por lo general respondemos a los mensajes de MyChart en el transcurso de 1 a 2 das hbiles.  Para renovar recetas, por favor pida a su farmacia que se ponga en contacto con nuestra oficina. Annie Sable de fax es [place] [phone].  Si tiene un asunto urgente cuando la clnica est cerrada y que no puede esperar hasta el siguiente da hbil, puede llamar/localizar a su doctor(a) al nmero que aparece a continuacin.   Por favor, tenga en cuenta que aunque hacemos todo lo posible para estar disponibles para asuntos urgentes fuera del horario de [place], no estamos disponibles las 24 horas del da, los 7 [address]  [address] de la [place].   Si tiene un problema urgente y no puede comunicarse con nosotros, puede optar por buscar atencin mdica  en el consultorio de su doctor(a), en una clnica privada, en un centro de atencin urgente o en una sala de emergencias.  Si tiene Engineer, drilling, por favor llame inmediatamente al 911 o vaya a la sala de emergencias.  Nmeros de bper  - Dr. Gwen Pounds:  [phone]  - Dra. Moye: [phone]  - Dra. Roseanne Reno: [phone]  En caso de inclemencias del [place], por favor llame a Lacy Duverney principal al [phone] para una actualizacin sobre el [place] de cualquier retraso o cierre.  Consejos para la medicacin en dermatologa: Por favor, guarde las cajas en las que vienen los medicamentos de uso tpico para ayudarle a seguir las instrucciones sobre dnde y cmo usarlos. Las farmacias generalmente imprimen las instrucciones del medicamento slo en las cajas y no directamente en los tubos del [place].   Si su medicamento es muy caro, por favor, pngase en contacto con Rolm Gala llamando al [phone] y presione la opcin 4 o envenos un mensaje a travs de Clinical cytogeneticist.   No podemos decirle cul ser su copago por los medicamentos por adelantado ya que esto es diferente dependiendo de la cobertura de su seguro. Sin embargo, es posible que podamos encontrar un medicamento sustituto a Audiological scientist un formulario para que el seguro cubra el medicamento que se considera necesario.   Si se requiere Futures trader  previa para que su compaa de seguros [place] su medicamento, por favor permtanos de 1 a 2 das hbiles para completar [address]  Los precios de los medicamentos varan con frecuencia dependiendo del Environmental consultant de dnde se surte la receta y alguna farmacias pueden ofrecer precios ms baratos.  El sitio web www.goodrx.com tiene cupones para medicamentos de Health and safety inspector. Los precios aqu no tienen en cuenta lo que podra costar con la ayuda del seguro (puede ser ms barato con su seguro), pero el sitio web puede darle el precio si no utiliz Tourist information centre manager.  - Puede imprimir el cupn correspondiente y llevarlo con su receta a la farmacia.  - Tambin puede pasar por nuestra oficina durante el horario de atencin regular y Education officer, museum una tarjeta de cupones de GoodRx.  - Si necesita que su receta se enve electrnicamente a  una farmacia diferente, informe a nuestra oficina a travs de MyChart de [place] o por telfono llamando al [phone] y presione la opcin 4.

## 2023-05-27 DIAGNOSIS — G4733 Obstructive sleep apnea (adult) (pediatric): Secondary | ICD-10-CM | POA: Diagnosis not present

## 2023-05-27 DIAGNOSIS — R200 Anesthesia of skin: Secondary | ICD-10-CM | POA: Diagnosis not present

## 2023-05-27 DIAGNOSIS — G479 Sleep disorder, unspecified: Secondary | ICD-10-CM | POA: Diagnosis not present

## 2023-05-27 DIAGNOSIS — M79602 Pain in left arm: Secondary | ICD-10-CM | POA: Diagnosis not present

## 2023-05-27 DIAGNOSIS — M79661 Pain in right lower leg: Secondary | ICD-10-CM | POA: Diagnosis not present

## 2023-05-27 DIAGNOSIS — M545 Low back pain, unspecified: Secondary | ICD-10-CM | POA: Diagnosis not present

## 2023-05-27 DIAGNOSIS — R2689 Other abnormalities of gait and mobility: Secondary | ICD-10-CM | POA: Diagnosis not present

## 2023-05-27 DIAGNOSIS — R2681 Unsteadiness on feet: Secondary | ICD-10-CM | POA: Diagnosis not present

## 2023-05-27 DIAGNOSIS — M542 Cervicalgia: Secondary | ICD-10-CM | POA: Diagnosis not present

## 2023-05-28 ENCOUNTER — Telehealth: Payer: Self-pay | Admitting: Family Medicine

## 2023-05-28 NOTE — Telephone Encounter (Signed)
Advised pt he will need to get this at the pharmacy.    Thanks,   -Vernona Rieger

## 2023-05-28 NOTE — Telephone Encounter (Signed)
Pt dermatologist requested the patient to have shingles vaccine. Directed to call PCP. Please advise CB- [phone]

## 2023-06-03 ENCOUNTER — Encounter: Payer: Self-pay | Admitting: Family Medicine

## 2023-06-08 DIAGNOSIS — H903 Sensorineural hearing loss, bilateral: Secondary | ICD-10-CM | POA: Diagnosis not present

## 2023-06-08 DIAGNOSIS — H60331 Swimmer's ear, right ear: Secondary | ICD-10-CM | POA: Diagnosis not present

## 2023-07-06 ENCOUNTER — Ambulatory Visit: Payer: Medicare Other | Admitting: Gastroenterology

## 2023-07-19 ENCOUNTER — Ambulatory Visit: Payer: Medicare Other | Admitting: Gastroenterology

## 2023-07-28 DIAGNOSIS — Z96612 Presence of left artificial shoulder joint: Secondary | ICD-10-CM | POA: Diagnosis not present

## 2023-07-29 ENCOUNTER — Telehealth: Payer: Self-pay | Admitting: Physician Assistant

## 2023-07-29 NOTE — Telephone Encounter (Signed)
I called the patient an left a voicemail also I sent a letter through MyChart asking him to call us back to get reschedule and apologized for the inconvenience. Appointment has been cancel.

## 2023-07-30 ENCOUNTER — Ambulatory Visit (INDEPENDENT_AMBULATORY_CARE_PROVIDER_SITE_OTHER): Payer: Medicare Other

## 2023-07-30 VITALS — BP 110/70 | Ht 70.0 in | Wt 304.0 lb

## 2023-07-30 DIAGNOSIS — Z0000 Encounter for general adult medical examination without abnormal findings: Secondary | ICD-10-CM

## 2023-07-30 NOTE — Patient Instructions (Signed)
Eric Andrews , Thank you for taking time to come for your Medicare Wellness Visit. I appreciate your ongoing commitment to your health goals. Please review the following plan we discussed and let me know if I can assist you in the future.   Referrals/Orders/Follow-Ups/Clinician Recommendations: none  This is a list of the screening recommended for you and due dates:  Health Maintenance  Topic Date Due   Yearly kidney health urinalysis for diabetes  Never done   Hepatitis C Screening  Never done   Zoster (Shingles) Vaccine (1 of 2) Never done   COVID-19 Vaccine (3 - Moderna risk series) 05/28/2021   Complete foot exam   07/08/2022   Hemoglobin A1C  06/22/2023   Flu Shot  07/08/2023   Eye exam for diabetics  07/21/2023   Yearly kidney function blood test for diabetes  05/18/2024   Medicare Annual Wellness Visit  07/29/2024   Colon Cancer Screening  03/21/2026   DTaP/Tdap/Td vaccine (2 - Td or Tdap) 01/17/2028   Pneumonia Vaccine  Completed   HPV Vaccine  Aged Out    Advanced directives: (ACP Link)Information on Advanced Care Planning can be found at [facility] Advance Health Care Directives Advance Health Care Directives ([email or web address]   Next Medicare Annual Wellness Visit scheduled for next year: Yes    08/04/24 @ 2:30 pm in person

## 2023-07-30 NOTE — Progress Notes (Signed)
Subjective:   Eric Andrews is a 71 y.o. male who presents for Medicare Annual/Subsequent preventive examination.  Visit Complete: In person   Review of Systems     Cardiac Risk Factors include: advanced age (>59men, >84 women);dyslipidemia;diabetes mellitus;male gender;obesity (BMI >30kg/m2)     Objective:    Today's Vitals   07/30/23 1443 07/30/23 1444  BP: 110/70   Weight: (!) 304 lb (137.9 kg)   Height: 5\' 10"  (1.778 m)   PainSc:  5    Body mass index is 43.62 kg/m.     07/30/2023    2:57 PM 03/22/2023    9:21 AM 01/05/2023   10:12 AM 12/22/2022    8:35 AM 09/24/2022   11:31 AM 04/21/2022    6:22 AM 04/10/2022   10:27 AM  Advanced Directives  Does Patient Have a Medical Advance Directive? No No No No No No No  Would patient like information on creating a medical advance directive? No - Patient declined No - Patient declined No - Patient declined  No - Patient declined No - Patient declined     Current Medications (verified) Outpatient Encounter Medications as of 07/30/2023  Medication Sig   acetaminophen (TYLENOL) 500 MG tablet Take 2 tablets (1,000 mg total) by mouth every 8 (eight) hours.   aspirin EC 81 MG tablet Take 81 mg by mouth daily. Swallow whole.   atorvastatin (LIPITOR) 20 MG tablet Take 1 tablet (20 mg total) by mouth at bedtime.   busPIRone (BUSPAR) 7.5 MG tablet Take 7.5 mg by mouth 2 (two) times daily.   celecoxib (CELEBREX) 200 MG capsule Take 1 capsule (200 mg total) by mouth 2 (two) times daily.   cetirizine (ZYRTEC) 10 MG tablet TAKE 1 TABLET(10 MG) BY MOUTH AT BEDTIME   chlorthalidone (HYGROTON) 25 MG tablet Take 1 tablet (25 mg total) by mouth daily.   FARXIGA 10 MG TABS tablet Take 1 tablet (10 mg total) by mouth daily before breakfast.   fenofibrate micronized (LOFIBRA) 134 MG capsule Take 1 capsule (134 mg total) by mouth daily before breakfast. TAKE 1 CAPSULE(134 MG) BY MOUTH DAILY BEFORE BREAKFAST   finasteride (PROSCAR) 5 MG tablet TAKE 1  TABLET(5 MG) BY MOUTH EVERY EVENING   fluticasone (FLONASE) 50 MCG/ACT nasal spray INSTILL 2 SPRAYS INTO BOTH NOSTRILS EVERY DAY   hydrocortisone 2.5 % cream APPLY TOPICALLY TO THE AFFECTED AREA OF FACE AND EARS 3 days a week Monday, Wednesday, Friday   insulin degludec (TRESIBA FLEXTOUCH) 100 UNIT/ML FlexTouch Pen Inject 68 Units into the skin at bedtime. (Patient taking differently: Inject 50 Units into the skin every evening.)   Insulin Pen Needle 31G X 5 MM MISC Use with insulin to inject into skin 3 times daily as directed   ketoconazole (NIZORAL) 2 % cream APPLY EXTERNALLY TO THE AFFECTED AREA OF FACE AND EARS 3 days a week on TUESDAY, THURSDAY, AND SATURDAY   losartan (COZAAR) 50 MG tablet Take 1 tablet (50 mg total) by mouth daily.   metFORMIN (GLUCOPHAGE-XR) 500 MG 24 hr tablet Take 2 tablets (1,000 mg total) by mouth 2 (two) times daily with a meal. Add refills for future   Multiple Vitamin (MULTIVITAMIN) capsule Take 1 capsule by mouth daily.   NOVOLOG FLEXPEN 100 UNIT/ML FlexPen Inject 14 Units into the skin 2 (two) times daily. Sliding scale   Omega-3 Fatty Acids (FISH OIL) 1200 MG CAPS Take 1,200 mg by mouth in the morning and at bedtime.   OZEMPIC, 1 MG/DOSE, 4 MG/3ML  SOPN Inject 1 mg into the skin once a week.   pioglitazone (ACTOS) 15 MG tablet Take 1 tablet (15 mg total) by mouth daily. OFFICE VISIT NEEDED FOR ADDITIONAL REFILLS, CALL OFFICE TO SCHEDULE   tacrolimus (PROTOPIC) 0.1 % ointment Apply to aa of face twice daily as needed for itchy rash   tamsulosin (FLOMAX) 0.4 MG CAPS capsule TAKE 1 CAPSULE BY MOUTH EVERY NIGHT AT BEDTIME FOR URINE RETENTION   traZODone (DESYREL) 100 MG tablet Take 1.5 tablets (150 mg total) by mouth at bedtime. (Patient taking differently: Take 200 mg by mouth at bedtime.)   venlafaxine XR (EFFEXOR-XR) 150 MG 24 hr capsule Take 1 capsule (150 mg total) by mouth daily with breakfast.   albuterol (VENTOLIN HFA) 108 (90 Base) MCG/ACT inhaler Inhale 2  puffs into the lungs every 6 (six) hours as needed for wheezing or shortness of breath. (Patient not taking: Reported on 05/20/2023)   amoxicillin-clavulanate (AUGMENTIN) 875-125 MG tablet Take 1 tablet by mouth 2 (two) times daily. (Patient not taking: Reported on 07/30/2023)   Ascorbic Acid (VITAMIN C) 1000 MG tablet Take 1,000 mg by mouth daily. (Patient not taking: Reported on 05/20/2023)   Bacillus Coagulans-Inulin (PROBIOTIC) 1-250 BILLION-MG CAPS Take 1 capsule by mouth daily. (Patient not taking: Reported on 05/20/2023)   cholecalciferol (VITAMIN D3) 25 MCG (1000 UNIT) tablet Take 1,000 Units by mouth in the morning and at bedtime. (Patient not taking: Reported on 05/20/2023)   Ciclopirox 1 % shampoo apply topically, massage into scalp and leave in for 5 -10 minutes before rinsing out (Patient not taking: Reported on 07/30/2023)   clindamycin (CLEOCIN-T) 1 % lotion Apply topically daily. To affected areas at scalp (Patient not taking: Reported on 07/30/2023)   clobetasol (TEMOVATE) 0.05 % external solution Apply topically a few drops to affected itchy areas on scalp and ears as needed. Avoid applying to face, groin, and axilla. Use as directed. (Patient not taking: Reported on 07/30/2023)   doxycycline (MONODOX) 100 MG capsule Take 1 capsule (100 mg total) by mouth daily. Take with food and drink (Patient not taking: Reported on 07/30/2023)   gabapentin (NEURONTIN) 600 MG tablet Take 600 mg by mouth in the morning, at noon, in the evening, and at bedtime.   QUEtiapine (SEROQUEL) 25 MG tablet Take 25-50 mg by mouth at bedtime. (Patient not taking: Reported on 07/30/2023)   traMADol (ULTRAM) 50 MG tablet Take 50 mg by mouth 3 (three) times daily as needed.   [DISCONTINUED] ondansetron (ZOFRAN-ODT) 4 MG disintegrating tablet Take 1 tablet (4 mg total) by mouth every 8 (eight) hours as needed for nausea or vomiting. (Patient not taking: Reported on 05/20/2023)   [DISCONTINUED] predniSONE (DELTASONE) 10 MG  tablet Take a dose daily with meal. Start with 4 tablets (40mg ) for 2 days, then 3 tab (30mg ) for 1 day, then 2 tab (20mg ) for 1 day, then 1 tab (10mg ) for 1 day. (Patient not taking: Reported on 07/30/2023)   No facility-administered encounter medications on file as of 07/30/2023.    Allergies (verified) Dust mite extract   History: Past Medical History:  Diagnosis Date   ADD (attention deficit disorder)    Anemia    Anxiety    Arthritis    Depression    Diabetes mellitus without complication (HCC)    Enlarged prostate    GERD (gastroesophageal reflux disease)    History of kidney stones    Hypertension    Knee pain    Neuromuscular disorder (HCC)  lower legs- neuropathy   Sleep apnea    Past Surgical History:  Procedure Laterality Date   APPENDECTOMY     CARPEL TUNEL Bilateral    COLONOSCOPY W/ POLYPECTOMY     COLONOSCOPY WITH PROPOFOL N/A 03/22/2023   Procedure: COLONOSCOPY WITH PROPOFOL;  Surgeon: Wyline Mood, MD;  [facility] END[facility]COPY;  Service: Gastroenterology;  Laterality: N/A;   HERNIA REPAIR     Left   JOINT REPLACEMENT     KNEE ARTHROSCOPY     Left   RIGHT WRIST SX X2     TOTAL KNEE ARTHROPLASTY Left 04/21/2022   Procedure: TOTAL KNEE ARTHROPLASTY;  Surgeon: Kennedy Bucker, MD;  Location: [facility] ORS;  Service: Orthopedics;  Laterality: Left;   TOTAL SHOULDER ARTHROPLASTY Left 01/05/2023   Procedure: Left total shoulder arthroplasty, biceps tenodesis;  Surgeon: Signa Kell, MD;  Location: [facility] ORS;  Service: Orthopedics;  Laterality: Left;   vascetomy     Family History  Problem Relation Age of Onset   Heart attack Mother 60   Lung cancer Father    Alzheimer's disease Brother    Heart attack Maternal Grandfather 57   Social History   Socioeconomic History   Marital status: Married    Spouse name: AHMIR DELANGEL   Number of children: 6   Years of education: Not on file   Highest education level: Not on file  Occupational History   Occupation: retired   Tobacco Use   Smoking status: Never   Smokeless tobacco: Never  Vaping Use   Vaping status: Never Used  Substance and Sexual Activity   Alcohol use: Not Currently   Drug use: Never   Sexual activity: Not Currently  Other Topics Concern   Not on file  Social History Narrative   Live with 2 daughters, 3 grandkids, 1 great grandskids   Social Determinants of Health   Financial Resource Strain: Low Risk  (07/30/2023)   Overall Financial Resource Strain (CARDIA)    Difficulty of Paying Living Expenses: Not very hard  Food Insecurity: No Food Insecurity (07/30/2023)   Hunger Vital Sign    Worried About Running Out of Food in the Last Year: Never true    Ran Out of Food in the Last Year: Never true  Transportation Needs: No Transportation Needs (07/30/2023)   PRAPARE - Administrator, Civil Service (Medical): No    Lack of Transportation (Non-Medical): No  Physical Activity: Insufficiently Active (07/30/2023)   Exercise Vital Sign    Days of Exercise per Week: 2 days    Minutes of Exercise per Session: 20 min  Stress: No Stress Concern Present (07/30/2023)   [employer] of Occupational Health - Occupational Stress Questionnaire    Feeling of Stress : Only a little  Social Connections: Moderately Integrated (07/30/2023)   Social Connection and Isolation Panel [NHANES]    Frequency of Communication with Friends and Family: More than three times a week    Frequency of Social Gatherings with Friends and Family: More than three times a week    Attends Religious Services: More than 4 times per year    Active Member of [employer] or Organizations: Not on file    Attends Banker Meetings: Never    Marital Status: Married    Tobacco Counseling Counseling given: Not Answered   Clinical Intake:  Pre-visit preparation completed: Yes  Pain : 0-10 Pain Score: 5  Pain Type: Chronic pain Pain Location: Leg Pain Orientation: Right, Left Pain Descriptors /  Indicators:  Discomfort, Constant, Radiating Pain Onset: More than a month ago Pain Frequency: Constant Pain Relieving Factors: SITTING  Pain Relieving Factors: SITTING  Nutritional Status: BMI > 30  Obese Nutritional Risks: None Diabetes: Yes CBG done?: No Did pt. bring in CBG monitor from home?: No  How often do you need to have someone help you when you read instructions, pamphlets, or other written materials from your doctor or pharmacy?: 1 - Never  Interpreter Needed?: No  Information entered by :: Kennedy Bucker, LPN   Activities of Daily Living    07/30/2023    2:59 PM 12/22/2022    8:37 AM  In your present state of health, do you have any difficulty performing the following activities:  Hearing? 0   Vision? 0   Difficulty concentrating or making decisions? 0   Walking or climbing stairs? 1   Comment legs   Dressing or bathing? 0   Doing errands, shopping? 0 0  Preparing Food and eating ? N   Using the Toilet? N   In the past six months, have you accidently leaked urine? N   Do you have problems with loss of bowel control? N   Managing your Medications? N   Managing your Finances? N   Housekeeping or managing your Housekeeping? N     Patient Care Team: Smitty Cords, DO as PCP - General (Family Medicine) Ronney Asters, Jackelyn Poling, RPH-CPP (Pharmacist)  Indicate any recent Medical Services you may have received from other than [facility] providers in the past year (date may be approximate).     Assessment:   This is a routine wellness examination for Eric Andrews.  Hearing/Vision screen Hearing Screening - Comments:: No aids Vision Screening - Comments:: Wears glasses- Dr.Woodard  Dietary issues and exercise activities discussed:     Goals Addressed             This Visit's Progress    DIET - EAT MORE FRUITS AND VEGETABLES         Depression Screen    07/30/2023    2:54 PM 11/23/2022   11:45 AM 08/11/2022    3:21 PM 07/27/2022    1:35 PM 12/17/2021     2:59 PM 07/15/2021   10:27 AM 07/08/2021    9:17 AM  PHQ 2/9 Scores  PHQ - 2 Score 1 3 1 1 1  0 2  PHQ- 9 Score  13 10 10 4  14     Fall Risk    07/30/2023    2:58 PM 11/23/2022   11:45 AM 08/11/2022    3:21 PM 07/27/2022    1:34 PM 12/17/2021    2:59 PM  Fall Risk   Falls in the past year? 0 0 1 1 0  Number falls in past yr: 1 0 1 1 0  Injury with Fall? 0 0 1 1 0  Risk for fall due to : History of fall(s) No Fall Risks Impaired balance/gait;History of fall(s) History of fall(s);Impaired balance/gait No Fall Risks  Follow up Falls prevention discussed;Falls evaluation completed Falls evaluation completed Falls evaluation completed Falls evaluation completed Falls evaluation completed    MEDICARE RISK AT HOME: Medicare Risk at Home Any stairs in or around the home?: Yes If so, are there any without handrails?: No Home free of loose throw rugs in walkways, pet beds, electrical cords, etc?: Yes Adequate lighting in your home to reduce risk of falls?: Yes Life alert?: No Use of a cane, walker or w/c?: Yes (cane) Grab bars in the  bathroom?: Yes Shower chair or bench in shower?: No Elevated toilet seat or a handicapped toilet?: No  TIMED UP AND GO:  Was the test performed?  No    Cognitive Function:        07/27/2022    1:43 PM 07/15/2021   10:32 AM 07/09/2020    9:53 AM  6CIT Screen  What Year? 0 points 0 points 0 points  What month? 0 points 0 points 0 points  What time? 0 points 0 points 0 points  Count back from 20 0 points 0 points 0 points  Months in reverse 0 points 0 points 0 points  Repeat phrase 0 points 0 points 0 points  Total Score 0 points 0 points 0 points    Immunizations Immunization History  Administered Date(s) Administered   Fluad Quad(high Dose 65+) 10/10/2020, 11/06/2021, 09/28/2022   Moderna Sars-Covid-2 Vaccination 02/09/2020, 03/13/2020   PFIZER SARS-COV-2 Pediatric Vaccination 5-7yrs 10/18/2020, 04/30/2021   PNEUMOCOCCAL CONJUGATE-20 11/06/2021    Tdap 01/16/2018    TDAP status: Up to date  Flu Vaccine status: Up to date  Pneumococcal vaccine status: Up to date  Covid-19 vaccine status: Completed vaccines  Qualifies for Shingles Vaccine? Yes   Zostavax completed No   Shingrix Completed?: No.    Education has been provided regarding the importance of this vaccine. Patient has been advised to call insurance company to determine out of pocket expense if they have not yet received this vaccine. Advised may also receive vaccine at local pharmacy or Health Dept. Verbalized acceptance and understanding.  Screening Tests Health Maintenance  Topic Date Due   Diabetic kidney evaluation - Urine ACR  Never done   Hepatitis C Screening  Never done   Zoster Vaccines- Shingrix (1 of 2) Never done   COVID-19 Vaccine (3 - Moderna risk series) 05/28/2021   FOOT EXAM  07/08/2022   HEMOGLOBIN A1C  06/22/2023   INFLUENZA VACCINE  07/08/2023   OPHTHALMOLOGY EXAM  07/21/2023   Diabetic kidney evaluation - eGFR measurement  05/18/2024   Medicare Annual Wellness (AWV)  07/29/2024   Colonoscopy  03/21/2026   DTaP/Tdap/Td (2 - Td or Tdap) 01/17/2028   Pneumonia Vaccine 52+ Years old  Completed   HPV VACCINES  Aged Out    Health Maintenance  Health Maintenance Due  Topic Date Due   Diabetic kidney evaluation - Urine ACR  Never done   Hepatitis C Screening  Never done   Zoster Vaccines- Shingrix (1 of 2) Never done   COVID-19 Vaccine (3 - Moderna risk series) 05/28/2021   FOOT EXAM  07/08/2022   HEMOGLOBIN A1C  06/22/2023   INFLUENZA VACCINE  07/08/2023   OPHTHALMOLOGY EXAM  07/21/2023    Colorectal cancer screening: Type of screening: Colonoscopy. Completed 03/22/23. Repeat every 3 years  Lung Cancer Screening: (Low Dose CT Chest recommended if Age 54-80 years, 20 pack-year currently smoking OR have quit w/in 15years.) does not qualify.   Additional Screening:  Hepatitis C Screening: does qualify; Completed no  Vision Screening:  Recommended annual ophthalmology exams for early detection of glaucoma and other disorders of the eye. Is the patient up to date with their annual eye exam?  Yes  Who is the provider or what is the name of the office in which the patient attends annual eye exams? Dr.Woodard If pt is not established with a provider, would they like to be referred to a provider to establish care? No .   Dental Screening: Recommended annual dental exams for  proper oral hygiene  Diabetic Foot Exam: Diabetic Foot Exam: Overdue, Pt has been advised about the importance in completing this exam. Pt is scheduled for diabetic foot exam on done on 07/08/21 .  Community Resource Referral / Chronic Care Management: CRR required this visit?  No   CCM required this visit?  No     Plan:     I have personally reviewed and noted the following in the patient's chart:   Medical and social history Use of alcohol, tobacco or illicit drugs  Current medications and supplements including opioid prescriptions. Patient is not currently taking opioid prescriptions. Functional ability and status Nutritional status Physical activity Advanced directives List of other physicians Hospitalizations, surgeries, and ER visits in previous 12 months Vitals Screenings to include cognitive, depression, and falls Referrals and appointments  In addition, I have reviewed and discussed with patient certain preventive protocols, quality metrics, and best practice recommendations. A written personalized care plan for preventive services as well as general preventive health recommendations were provided to patient.     Hal Hope, LPN   06/03/3150   After Visit Summary: (MyChart) Due to this being a telephonic visit, the after visit summary with patients personalized plan was offered to patient via MyChart   Nurse Notes: none

## 2023-08-04 DIAGNOSIS — H43393 Other vitreous opacities, bilateral: Secondary | ICD-10-CM | POA: Diagnosis not present

## 2023-08-04 DIAGNOSIS — E119 Type 2 diabetes mellitus without complications: Secondary | ICD-10-CM | POA: Diagnosis not present

## 2023-08-04 DIAGNOSIS — H2513 Age-related nuclear cataract, bilateral: Secondary | ICD-10-CM | POA: Diagnosis not present

## 2023-08-04 DIAGNOSIS — H35373 Puckering of macula, bilateral: Secondary | ICD-10-CM | POA: Diagnosis not present

## 2023-08-04 LAB — HM DIABETES EYE EXAM

## 2023-08-11 ENCOUNTER — Ambulatory Visit: Payer: Medicare Other | Admitting: Physician Assistant

## 2023-08-12 ENCOUNTER — Encounter: Payer: Self-pay | Admitting: Family Medicine

## 2023-08-16 LAB — MICROALBUMIN / CREATININE URINE RATIO: Microalb Creat Ratio: 29.99

## 2023-08-17 ENCOUNTER — Ambulatory Visit (INDEPENDENT_AMBULATORY_CARE_PROVIDER_SITE_OTHER): Payer: Medicare Other | Admitting: Family Medicine

## 2023-08-17 ENCOUNTER — Encounter: Payer: Self-pay | Admitting: Family Medicine

## 2023-08-17 ENCOUNTER — Ambulatory Visit: Payer: Medicare Other | Admitting: Family Medicine

## 2023-08-17 ENCOUNTER — Encounter: Payer: Self-pay | Admitting: Gastroenterology

## 2023-08-17 VITALS — BP 132/80 | HR 66 | Ht 69.0 in | Wt 302.0 lb

## 2023-08-17 DIAGNOSIS — R2681 Unsteadiness on feet: Secondary | ICD-10-CM | POA: Diagnosis not present

## 2023-08-17 DIAGNOSIS — Z310 Encounter for reversal of previous sterilization: Secondary | ICD-10-CM | POA: Diagnosis not present

## 2023-08-17 DIAGNOSIS — R2689 Other abnormalities of gait and mobility: Secondary | ICD-10-CM | POA: Diagnosis not present

## 2023-08-17 DIAGNOSIS — G8929 Other chronic pain: Secondary | ICD-10-CM | POA: Diagnosis not present

## 2023-08-17 DIAGNOSIS — M51369 Other intervertebral disc degeneration, lumbar region without mention of lumbar back pain or lower extremity pain: Secondary | ICD-10-CM

## 2023-08-17 DIAGNOSIS — Z6841 Body Mass Index (BMI) 40.0 and over, adult: Secondary | ICD-10-CM

## 2023-08-17 DIAGNOSIS — Z23 Encounter for immunization: Secondary | ICD-10-CM

## 2023-08-17 DIAGNOSIS — M545 Low back pain, unspecified: Secondary | ICD-10-CM | POA: Diagnosis not present

## 2023-08-17 DIAGNOSIS — Z794 Long term (current) use of insulin: Secondary | ICD-10-CM | POA: Diagnosis not present

## 2023-08-17 DIAGNOSIS — G4733 Obstructive sleep apnea (adult) (pediatric): Secondary | ICD-10-CM | POA: Diagnosis not present

## 2023-08-17 DIAGNOSIS — M79602 Pain in left arm: Secondary | ICD-10-CM | POA: Diagnosis not present

## 2023-08-17 DIAGNOSIS — M79661 Pain in right lower leg: Secondary | ICD-10-CM | POA: Diagnosis not present

## 2023-08-17 DIAGNOSIS — G479 Sleep disorder, unspecified: Secondary | ICD-10-CM | POA: Diagnosis not present

## 2023-08-17 DIAGNOSIS — E1169 Type 2 diabetes mellitus with other specified complication: Secondary | ICD-10-CM | POA: Diagnosis not present

## 2023-08-17 DIAGNOSIS — M542 Cervicalgia: Secondary | ICD-10-CM | POA: Diagnosis not present

## 2023-08-17 DIAGNOSIS — M5136 Other intervertebral disc degeneration, lumbar region: Secondary | ICD-10-CM | POA: Diagnosis not present

## 2023-08-17 DIAGNOSIS — M79662 Pain in left lower leg: Secondary | ICD-10-CM | POA: Diagnosis not present

## 2023-08-17 DIAGNOSIS — N5089 Other specified disorders of the male genital organs: Secondary | ICD-10-CM | POA: Diagnosis not present

## 2023-08-17 LAB — POCT GLYCOSYLATED HEMOGLOBIN (HGB A1C): Hemoglobin A1C: 6.9 % — AB (ref 4.0–5.6)

## 2023-08-17 NOTE — Progress Notes (Unsigned)
Subjective:    Patient ID: Eric Andrews, male    DOB: 12-22-1950, 72 y.o.   MRN: [id number]  Eric Andrews is a 72 y.o. male presenting on 08/17/2023 for Diabetes   HPI  R Testicle S/p vasectomy 25 yr ago R testicle scrotal mass structure  He has seen Dr Lonna Cobb [facility] 2023 for Kidney stone  Followed by [facility] Dr Malvin Johns Updates on Sciatica and nerves  CHRONIC DM, Type 2 Morbid Obesity Today A1c 6.9 improved Working with [facility] has [place] and Ozempic from manufacturer admits overeating at times. Stress eating - interested in psychiatry or therapist Meds: Humalog TID SSI with meals, Tresiba 68 units daily, Jardiance 25mg , Pioglitazone 15mg  - ON ozempic 0.5mg  weekly Reports  good compliance. Tolerating well w/o side-effects Currently on ARB Completed DM EYe exam DM Foot exam He cannot void today Denies hypoglycemia, polyuria, visual changes, numbness or tingling   Health Maintenance: Decline Flu Vaccine today     08/17/2023    9:45 AM 07/30/2023    2:54 PM 11/23/2022   11:45 AM  Depression screen PHQ 2/9  Decreased Interest 1  2  Down, Depressed, Hopeless 0 1 1  PHQ - 2 Score 1 1 3   Altered sleeping 1  1  Tired, decreased energy 2 1 1   Change in appetite   3  Feeling bad or failure about yourself  2  1  Trouble concentrating 2  3  Moving slowly or fidgety/restless 0  1  Suicidal thoughts 0  0  PHQ-9 Score 8  13  Difficult doing work/chores Not difficult at all Not difficult at all Somewhat difficult    Social History   Tobacco Use   Smoking status: Never   Smokeless tobacco: Never  Vaping Use   Vaping status: Never Used  Substance Use Topics   Alcohol use: Not Currently   Drug use: Never    Review of Systems Per HPI unless specifically indicated above     Objective:    BP 132/80   Pulse 66   Ht 5\' 9"  (1.753 m)   Wt (!) 302 lb (137 kg)   SpO2 96%   BMI 44.60 kg/m   Wt Readings from Last 3 Encounters:  08/17/23 (!) 302 lb (137 kg)   07/30/23 (!) 304 lb (137.9 kg)  05/20/23 (!) 302 lb 6.4 oz (137.2 kg)    Physical Exam Vitals and nursing note reviewed.  Constitutional:      General: He is not in acute distress.    Appearance: He is well-developed. He is obese. He is not diaphoretic.     Comments: Well-appearing, comfortable, cooperative  HENT:     Head: Normocephalic and atraumatic.  Eyes:     General:        Right eye: No discharge.        Left eye: No discharge.     Conjunctiva/sclera: Conjunctivae normal.  Neck:     Thyroid: No thyromegaly.  Cardiovascular:     Rate and Rhythm: Normal rate and regular rhythm.     Pulses: Normal pulses.     Heart sounds: Normal heart sounds. No murmur heard. Pulmonary:     Effort: Pulmonary effort is normal. No respiratory distress.     Breath sounds: Normal breath sounds. No wheezing or rales.  Genitourinary:    Comments: Scrotal exam R superior pole of testicle cord structure with nodular structure non tender no other deformity not associated with testicle Musculoskeletal:  General: Normal range of motion.     Cervical back: Normal range of motion and neck supple.  Lymphadenopathy:     Cervical: No cervical adenopathy.  Skin:    General: Skin is warm and dry.     Findings: No erythema or rash.  Neurological:     Mental Status: He is alert and oriented to person, place, and time. Mental status is at baseline.  Psychiatric:        Behavior: Behavior normal.     Comments: Well groomed, good eye contact, normal speech and thoughts     Diabetic Foot Exam - Simple   Simple Foot Form Diabetic Foot exam was performed with the following findings: Yes 08/17/2023 10:05 AM  Visual Inspection No deformities, no ulcerations, no other skin breakdown bilaterally: Yes Sensation Testing Intact to touch and monofilament testing bilaterally: Yes Pulse Check Posterior Tibialis and Dorsalis pulse intact bilaterally: Yes Comments      Results for orders placed or  performed in visit on 08/17/23  POCT glycosylated hemoglobin (Hb A1C)  Result Value Ref Range   Hemoglobin A1C 6.9 (A) 4.0 - 5.6 %   HbA1c POC (<> result, manual entry)     HbA1c, POC (prediabetic range)     HbA1c, POC (controlled diabetic range)        Assessment & Plan:   Problem List Items Addressed This Visit     DDD (degenerative disc disease), lumbar   Morbid obesity with BMI of 40.0-44.9, adult (HCC)   Type 2 diabetes mellitus with other specified complication (HCC) - Primary    Improved A1c to 6.9, controlled Complications - cataracts, hyperlipidemia, obesity, depression-  increases risk of future cardiovascular complications   Plan:  Continue Ozempic 1mg  weekly Continue insulin - dose adjustment accordingly - Continue Jardiance 25mg  daily, Pioglitazone 15mg  daily, Metformin XR 1000mg  BID (500 x 2) Goal to come off Actos in future or reduce meal time insulin  2. Encourage improved lifestyle - low carb, low sugar diet, reduce portion size, continue improving regular exercise 3. Check CBG, bring log to next visit for review 4. Continue ASA, ARB, Statin 5. Advised to schedule DM ophtho exam, send record DM Foot exam      Relevant Orders   POCT glycosylated hemoglobin (Hb A1C) (Completed)   Other Visit Diagnoses     Scrotal mass       Relevant Orders   US SCROTUM W/DOPPLER   History of vasovasostomy       Relevant Orders   US SCROTUM W/DOPPLER       Morbid Obesity BMI >44 Counseling on lifestyle management weight loss   R sided scrotal mass assoc with cord structures, seems to be cyst Non tender. No other significant abnormality identified Order US Scrotum, pending result  Orders Placed This Encounter  Procedures   US SCROTUM W/DOPPLER    Standing Status:   Future    Standing Expiration Date:   02/14/2024    Order Specific Question:   Reason for Exam (SYMPTOM  OR DIAGNOSIS REQUIRED)    Answer:   R cystic structure associated with superior aspect of R  Testicle, history of vasectomy 25 yr ago    Order Specific Question:   Preferred imaging location?    Answer:   [facility]   POCT glycosylated hemoglobin (Hb A1C)     No orders of the defined types were placed in this encounter.     Follow up plan: Return in about 6 months (around 02/14/2024) for  6 month fasting lab only then 1 week later Annual Physical.  Future labs ordered for 02/14/24   Saralyn Pilar, DO [facility] 08/17/2023, 9:56 AM

## 2023-08-17 NOTE — Patient Instructions (Addendum)
Thank you for coming to the office today.  Testicular ultrasound ordered, stay tuned for updates  Recent Labs    12/22/22 0926 08/17/23 0948  HGBA1C 6.8* 6.9*    DUE for FASTING BLOOD WORK (no food or drink after midnight before the lab appointment, only water or coffee without cream/sugar on the morning of)  SCHEDULE "Lab Only" visit in the morning at the clinic for lab draw in 6 MONTHS   - Make sure Lab Only appointment is at about 1 week before your next appointment, so that results will be available  For Lab Results, once available within 2-3 days of blood draw, you can can log in to MyChart online to view your results and a brief explanation. Also, we can discuss results at next follow-up visit.    Please schedule a Follow-up Appointment to: Return in about 6 months (around 02/14/2024) for 6 month fasting lab only then 1 week later Annual Physical.  If you have any other questions or concerns, please feel free to call the office or send a message through MyChart. You may also schedule an earlier appointment if necessary.  Additionally, you may be receiving a survey about your experience at our office within a few days to 1 week by e-mail or mail. We value your feedback.  Saralyn Pilar, DO [facility], [place]

## 2023-08-18 ENCOUNTER — Ambulatory Visit: Payer: Medicare Other

## 2023-08-18 ENCOUNTER — Ambulatory Visit: Payer: Medicare Other | Admitting: Pulmonary Disease

## 2023-08-18 ENCOUNTER — Encounter
Admission: RE | Disposition: A | Discharge status: Home / Self Care | Payer: Self-pay | Source: Home / Self Care | Attending: Gastroenterology

## 2023-08-18 ENCOUNTER — Ambulatory Visit
Admission: RE | Admit: 2023-08-18 | Discharge: 2023-08-18 | Disposition: A | Discharge status: Home / Self Care | Payer: Medicare Other | Attending: Gastroenterology | Admitting: Gastroenterology

## 2023-08-18 ENCOUNTER — Other Ambulatory Visit: Payer: Self-pay | Admitting: Family Medicine

## 2023-08-18 DIAGNOSIS — F419 Anxiety disorder, unspecified: Secondary | ICD-10-CM | POA: Insufficient documentation

## 2023-08-18 DIAGNOSIS — R1013 Epigastric pain: Secondary | ICD-10-CM | POA: Diagnosis present

## 2023-08-18 DIAGNOSIS — N401 Enlarged prostate with lower urinary tract symptoms: Secondary | ICD-10-CM

## 2023-08-18 DIAGNOSIS — F32A Depression, unspecified: Secondary | ICD-10-CM | POA: Insufficient documentation

## 2023-08-18 DIAGNOSIS — K2970 Gastritis, unspecified, without bleeding: Secondary | ICD-10-CM | POA: Diagnosis not present

## 2023-08-18 DIAGNOSIS — Z96652 Presence of left artificial knee joint: Secondary | ICD-10-CM | POA: Insufficient documentation

## 2023-08-18 DIAGNOSIS — K219 Gastro-esophageal reflux disease without esophagitis: Secondary | ICD-10-CM | POA: Insufficient documentation

## 2023-08-18 DIAGNOSIS — I10 Essential (primary) hypertension: Secondary | ICD-10-CM

## 2023-08-18 DIAGNOSIS — R1310 Dysphagia, unspecified: Secondary | ICD-10-CM | POA: Diagnosis not present

## 2023-08-18 DIAGNOSIS — M5136 Other intervertebral disc degeneration, lumbar region: Secondary | ICD-10-CM

## 2023-08-18 DIAGNOSIS — G4730 Sleep apnea, unspecified: Secondary | ICD-10-CM | POA: Diagnosis not present

## 2023-08-18 DIAGNOSIS — Z96612 Presence of left artificial shoulder joint: Secondary | ICD-10-CM | POA: Insufficient documentation

## 2023-08-18 DIAGNOSIS — E1169 Type 2 diabetes mellitus with other specified complication: Secondary | ICD-10-CM

## 2023-08-18 DIAGNOSIS — N400 Enlarged prostate without lower urinary tract symptoms: Secondary | ICD-10-CM | POA: Diagnosis not present

## 2023-08-18 DIAGNOSIS — E119 Type 2 diabetes mellitus without complications: Secondary | ICD-10-CM | POA: Diagnosis not present

## 2023-08-18 DIAGNOSIS — Z79899 Other long term (current) drug therapy: Secondary | ICD-10-CM | POA: Diagnosis not present

## 2023-08-18 DIAGNOSIS — Z794 Long term (current) use of insulin: Secondary | ICD-10-CM | POA: Diagnosis not present

## 2023-08-18 DIAGNOSIS — Z7984 Long term (current) use of oral hypoglycemic drugs: Secondary | ICD-10-CM | POA: Diagnosis not present

## 2023-08-18 DIAGNOSIS — Z9049 Acquired absence of other specified parts of digestive tract: Secondary | ICD-10-CM | POA: Diagnosis not present

## 2023-08-18 DIAGNOSIS — Z9889 Other specified postprocedural states: Secondary | ICD-10-CM | POA: Diagnosis not present

## 2023-08-18 DIAGNOSIS — Z0000 Encounter for general adult medical examination without abnormal findings: Secondary | ICD-10-CM

## 2023-08-18 HISTORY — PX: ESOPHAGOGASTRODUODENOSCOPY (EGD) WITH PROPOFOL: SHX5813

## 2023-08-18 LAB — GLUCOSE, CAPILLARY: Glucose-Capillary: 207 mg/dL — ABNORMAL HIGH (ref 70–99)

## 2023-08-18 SURGERY — ESOPHAGOGASTRODUODENOSCOPY (EGD) WITH PROPOFOL
Anesthesia: General

## 2023-08-18 MED ORDER — SODIUM CHLORIDE 0.9 % IV SOLN: INTRAVENOUS | Status: DC

## 2023-08-18 MED ORDER — LIDOCAINE HCL (CARDIAC) PF 100 MG/5ML IV SOSY
PREFILLED_SYRINGE | INTRAVENOUS | Status: DC | PRN
  Administered 2023-08-18: 100 mg via INTRAVENOUS

## 2023-08-18 MED ORDER — PROPOFOL 10 MG/ML IV BOLUS
INTRAVENOUS | Status: DC | PRN
  Administered 2023-08-18: 100 mg via INTRAVENOUS

## 2023-08-18 MED ORDER — DEXMEDETOMIDINE HCL IN NACL 80 MCG/20ML IV SOLN
INTRAVENOUS | Status: DC | PRN
  Administered 2023-08-18: 8 ug via INTRAVENOUS

## 2023-08-18 NOTE — Anesthesia Postprocedure Evaluation (Signed)
Anesthesia Post Note  Patient: Hovanes Gibas  Procedure(s) Performed: ESOPHAGOGASTRODUODENOSCOPY (EGD) WITH PROPOFOL  Patient location during evaluation: Endoscopy Anesthesia Type: General Level of consciousness: awake and alert Pain management: pain level controlled Vital Signs Assessment: post-procedure vital signs reviewed and stable Respiratory status: spontaneous breathing, nonlabored ventilation, respiratory function stable and patient connected to nasal cannula oxygen Cardiovascular status: blood pressure returned to baseline and stable Postop Assessment: no apparent nausea or vomiting Anesthetic complications: no   No notable events documented.   Last Vitals:  Vitals:   08/18/23 1049 08/18/23 1059  BP: 128/75 115/74  Resp:    Temp: (!) 36.1 C   SpO2:      Last Pain:  Vitals:   08/18/23 1059  TempSrc:   PainSc: 0-No pain                 Louie Boston

## 2023-08-18 NOTE — Assessment & Plan Note (Signed)
Improved A1c to 6.9, controlled Complications - cataracts, hyperlipidemia, obesity, depression-  increases risk of future cardiovascular complications   Plan:  Continue Ozempic 1mg  weekly Continue insulin - dose adjustment accordingly - Continue Jardiance 25mg  daily, Pioglitazone 15mg  daily, Metformin XR 1000mg  BID (500 x 2) Goal to come off Actos in future or reduce meal time insulin  2. Encourage improved lifestyle - low carb, low sugar diet, reduce portion size, continue improving regular exercise 3. Check CBG, bring log to next visit for review 4. Continue ASA, ARB, Statin 5. Advised to schedule DM ophtho exam, send record DM Foot exam

## 2023-08-18 NOTE — Anesthesia Preprocedure Evaluation (Signed)
Anesthesia Evaluation  Patient identified by MRN, date of birth, ID band Patient awake    Reviewed: Allergy & Precautions, NPO status , Patient's Chart, lab work & pertinent test results  History of Anesthesia Complications Negative for: history of anesthetic complications  Airway Mallampati: III  TM Distance: >3 FB Neck ROM: full    Dental  (+) Chipped   Pulmonary sleep apnea and Continuous Positive Airway Pressure Ventilation    Pulmonary exam normal        Cardiovascular hypertension, On Medications negative cardio ROS Normal cardiovascular exam     Neuro/Psych  PSYCHIATRIC DISORDERS Anxiety Depression     Neuromuscular disease    GI/Hepatic Neg liver ROS,GERD  Medicated,,  Endo/Other  negative endocrine ROSdiabetes    Renal/GU negative Renal ROS  negative genitourinary   Musculoskeletal  (+) Arthritis ,    Abdominal   Peds  Hematology  (+) Blood dyscrasia, anemia   Anesthesia Other Findings Past Medical History: No date: ADD (attention deficit disorder) No date: Anemia No date: Anxiety No date: Arthritis No date: Depression No date: Diabetes mellitus without complication No date: Enlarged prostate No date: GERD (gastroesophageal reflux disease) No date: History of kidney stones No date: Hypertension No date: Knee pain No date: Neuromuscular disorder     Comment:  lower legs- neuropathy No date: Sleep apnea  Past Surgical History: No date: APPENDECTOMY No date: CARPEL TUNEL; Bilateral No date: COLONOSCOPY W/ POLYPECTOMY No date: HERNIA REPAIR     Comment:  Left No date: JOINT REPLACEMENT No date: KNEE ARTHROSCOPY     Comment:  Left No date: RIGHT WRIST SX X2 04/21/2022: TOTAL KNEE ARTHROPLASTY; Left     Comment:  Procedure: TOTAL KNEE ARTHROPLASTY;  Surgeon: Kennedy Bucker, MD;  Location: [facility] ORS;  Service: Orthopedics;               Laterality: Left; 01/05/2023: TOTAL SHOULDER  ARTHROPLASTY; Left     Comment:  Procedure: Left total shoulder arthroplasty, biceps               tenodesis;  Surgeon: Signa Kell, MD;  Location: [facility]               ORS;  Service: Orthopedics;  Laterality: Left; No date: vascetomy  BMI    Body Mass Index: 41.42 kg/m      Reproductive/Obstetrics negative OB ROS                             Anesthesia Physical Anesthesia Plan  ASA: 3  Anesthesia Plan: General   Post-op Pain Management: Minimal or no pain anticipated   Induction: Intravenous  PONV Risk Score and Plan: Propofol infusion and TIVA  Airway Management Planned: Natural Airway and Nasal Cannula  Additional Equipment:   Intra-op Plan:   Post-operative Plan:   Informed Consent: I have reviewed the patients History and Physical, chart, labs and discussed the procedure including the risks, benefits and alternatives for the proposed anesthesia with the patient or authorized representative who has indicated his/her understanding and acceptance.     Dental Advisory Given  Plan Discussed with: Anesthesiologist, CRNA and Surgeon  Anesthesia Plan Comments: (Patient consented for risks of anesthesia including but not limited to:  - adverse reactions to medications - risk of airway placement if required - damage to eyes, teeth, lips or other oral mucosa - nerve damage  due to positioning  - sore throat or hoarseness - Damage to heart, brain, nerves, lungs, other parts of body or loss of life  Patient voiced understanding.)       Anesthesia Quick Evaluation

## 2023-08-18 NOTE — Transfer of Care (Signed)
Immediate Anesthesia Transfer of Care Note  Patient: Eric Andrews  Procedure(s) Performed: ESOPHAGOGASTRODUODENOSCOPY (EGD) WITH PROPOFOL  Patient Location: Endoscopy Unit  Anesthesia Type:General  Level of Consciousness: awake and patient cooperative  Airway & Oxygen Therapy: Patient Spontanous Breathing and Patient connected to face mask oxygen  Post-op Assessment: Report given to RN and Post -op Vital signs reviewed and stable  Post vital signs: Reviewed and stable  Last Vitals:  Vitals Value Taken Time  BP 128/75 08/18/23 1049  Temp    Pulse 65 08/18/23 1050  Resp 18 08/18/23 1050  SpO2 96 % 08/18/23 1050  Vitals shown include unfiled device data.  Last Pain:  Vitals:   08/18/23 0952  TempSrc: Temporal  PainSc: 5          Complications: No notable events documented.

## 2023-08-18 NOTE — Anesthesia Procedure Notes (Signed)
Procedure Name: General with mask airway Date/Time: 08/18/2023 10:39 AM  Performed by: Lily Lovings, CRNAPre-anesthesia Checklist: Patient identified, Suction available, Emergency Drugs available, Patient being monitored and Timeout performed Patient Re-evaluated:Patient Re-evaluated prior to induction Oxygen Delivery Method: Simple face mask Preoxygenation: Pre-oxygenation with 100% oxygen Induction Type: IV induction Comments: POM mas

## 2023-08-18 NOTE — H&P (Signed)
Wyline Mood, MD [address], [address], [place], [place], [address] [address], [place], [place], [address] Phone: [phone]  Fax: [phone]  Primary Care Physician:  Smitty Cords, DO   Pre-Procedure History & Physical: HPI:  Eric Andrews is a 72 y.o. male is here for an endoscopy    Past Medical History:  Diagnosis Date   ADD (attention deficit disorder)    Anemia    Anxiety    Arthritis    Depression    Diabetes mellitus without complication (HCC)    Enlarged prostate    GERD (gastroesophageal reflux disease)    History of kidney stones    Hypertension    Knee pain    Neuromuscular disorder (HCC)    lower legs- neuropathy   Sleep apnea     Past Surgical History:  Procedure Laterality Date   APPENDECTOMY     CARPEL TUNEL Bilateral    COLONOSCOPY W/ POLYPECTOMY     COLONOSCOPY WITH PROPOFOL N/A 03/22/2023   Procedure: COLONOSCOPY WITH PROPOFOL;  Surgeon: Wyline Mood, MD;  Location[facility] ENDOSCOPY;  Service: Gastroenterology;  Laterality: N/A;   HERNIA REPAIR     Left   JOINT REPLACEMENT     KNEE ARTHROSCOPY     Left   RIGHT WRIST SX X2     TOTAL KNEE ARTHROPLASTY Left 04/21/2022   Procedure: TOTAL KNEE ARTHROPLASTY;  Surgeon: Kennedy Bucker, MD;  Location: [facility] ORS;  Service: Orthopedics;  Laterality: Left;   TOTAL SHOULDER ARTHROPLASTY Left 01/05/2023   Procedure: Left total shoulder arthroplasty, biceps tenodesis;  Surgeon: Signa Kell, MD;  Location: [facility] ORS;  Service: Orthopedics;  Laterality: Left;   vascetomy      Prior to Admission medications   Medication Sig Start Date End Date Taking? Authorizing Provider  atorvastatin (LIPITOR) 20 MG tablet Take 1 tablet (20 mg total) by mouth at bedtime. 08/11/22  Yes Karamalegos, Netta Neat, DO  busPIRone (BUSPAR) 7.5 MG tablet Take 7.5 mg by mouth 2 (two) times daily. 03/23/22  Yes [provider]  celecoxib (CELEBREX) 200 MG capsule Take 1 capsule (200 mg total) by mouth  2 (two) times daily. 03/01/23  Yes Karamalegos, Netta Neat, DO  cetirizine (ZYRTEC) 10 MG tablet TAKE 1 TABLET(10 MG) BY MOUTH AT BEDTIME 08/11/22  Yes Karamalegos, Netta Neat, DO  chlorthalidone (HYGROTON) 25 MG tablet Take 1 tablet (25 mg total) by mouth daily. 08/11/22  Yes Karamalegos, Netta Neat, DO  fenofibrate micronized (LOFIBRA) 134 MG capsule Take 1 capsule (134 mg total) by mouth daily before breakfast. TAKE 1 CAPSULE(134 MG) BY MOUTH DAILY BEFORE BREAKFAST 08/11/22  Yes Karamalegos, Alexander J, DO  finasteride (PROSCAR) 5 MG tablet TAKE 1 TABLET(5 MG) BY MOUTH EVERY EVENING 08/11/22  Yes Karamalegos, Netta Neat, DO  gabapentin (NEURONTIN) 600 MG tablet Take 600 mg by mouth in the morning, at noon, in the evening, and at bedtime. 10/21/21 08/18/23 Yes [provider]  insulin degludec (TRESIBA FLEXTOUCH) 100 UNIT/ML FlexTouch Pen Inject 68 Units into the skin at bedtime. Patient taking differently: Inject 50 Units into the skin every evening. 01/29/22  Yes Karamalegos, Netta Neat, DO  losartan (COZAAR) 50 MG tablet Take 1 tablet (50 mg total) by mouth daily. 08/11/22  Yes Karamalegos, Alexander J, DO  NOVOLOG FLEXPEN 100 UNIT/ML FlexPen Inject 14 Units into the skin 2 (two) times daily. Sliding scale 03/11/21  Yes Karamalegos, Netta Neat, DO  pioglitazone (ACTOS) 15 MG tablet Take 1 tablet (15 mg  total) by mouth daily. OFFICE VISIT NEEDED FOR ADDITIONAL REFILLS, CALL OFFICE TO SCHEDULE 12/09/22  Yes Karamalegos, Netta Neat, DO  tamsulosin (FLOMAX) 0.4 MG CAPS capsule TAKE 1 CAPSULE BY MOUTH EVERY NIGHT AT BEDTIME FOR URINE RETENTION 08/11/22  Yes Karamalegos, Netta Neat, DO  traMADol (ULTRAM) 50 MG tablet Take 50 mg by mouth 3 (three) times daily as needed.   Yes [provider]  venlafaxine XR (EFFEXOR-XR) 150 MG 24 hr capsule Take 1 capsule (150 mg total) by mouth daily with breakfast. 08/11/22  Yes Karamalegos, Netta Neat, DO  Ascorbic Acid (VITAMIN C) 1000 MG tablet Take 1,000 mg  by mouth daily.    [provider]  aspirin EC 81 MG tablet Take 81 mg by mouth daily. Swallow whole.    [provider]  cholecalciferol (VITAMIN D3) 25 MCG (1000 UNIT) tablet Take 1,000 Units by mouth in the morning and at bedtime.    [provider]  FARXIGA 10 MG TABS tablet Take 1 tablet (10 mg total) by mouth daily before breakfast. 10/14/22   Althea Charon, Netta Neat, DO  fluticasone (FLONASE) 50 MCG/ACT nasal spray INSTILL 2 SPRAYS INTO BOTH NOSTRILS EVERY DAY 04/06/23   Althea Charon, Netta Neat, DO  hydrocortisone 2.5 % cream APPLY TOPICALLY TO THE AFFECTED AREA OF FACE AND EARS 3 days a week Monday, Wednesday, Friday 01/07/23   Deirdre Evener, MD  Insulin Pen Needle 31G X 5 MM MISC Use with insulin to inject into skin 3 times daily as directed 10/10/20   Althea Charon, Netta Neat, DO  ketoconazole (NIZORAL) 2 % cream APPLY EXTERNALLY TO THE AFFECTED AREA OF FACE AND EARS 3 days a week on TUESDAY, THURSDAY, AND SATURDAY 03/31/23   Deirdre Evener, MD  metFORMIN (GLUCOPHAGE-XR) 500 MG 24 hr tablet Take 2 tablets (1,000 mg total) by mouth 2 (two) times daily with a meal. Add refills for future 08/11/22   Smitty Cords, DO  Multiple Vitamin (MULTIVITAMIN) capsule Take 1 capsule by mouth daily.    [provider]  Omega-3 Fatty Acids (FISH OIL) 1200 MG CAPS Take 1,200 mg by mouth in the morning and at bedtime.    [provider]  OZEMPIC, 1 MG/DOSE, 4 MG/3ML SOPN Inject 1 mg into the skin once a week. 01/29/22   Karamalegos, Netta Neat, DO  traZODone (DESYREL) 100 MG tablet Take 1.5 tablets (150 mg total) by mouth at bedtime. Patient taking differently: Take 200 mg by mouth at bedtime. 08/11/22   Smitty Cords, DO    Allergies as of 05/19/2023 - Review Complete 05/19/2023  Allergen Reaction Noted   Dust mite extract  05/27/2020    Family History  Problem Relation Age of Onset   Heart attack Mother 27   Lung cancer Father     Alzheimer's disease Brother    Heart attack Maternal Grandfather 71    Social History   Socioeconomic History   Marital status: Married    Spouse name: CLEMENTE HANDLEY   Number of children: 6   Years of education: Not on file   Highest education level: Not on file  Occupational History   Occupation: retired  Tobacco Use   Smoking status: Never   Smokeless tobacco: Never  Vaping Use   Vaping status: Never Used  Substance and Sexual Activity   Alcohol use: Not Currently   Drug use: Never   Sexual activity: Not Currently  Other Topics Concern   Not on file  Social History Narrative  Live with 2 daughters, 3 grandkids, 1 great grandskids   Social Determinants of Health   Financial Resource Strain: Low Risk  (07/30/2023)   Overall Financial Resource Strain (CARDIA)    Difficulty of Paying Living Expenses: Not very hard  Food Insecurity: No Food Insecurity (07/30/2023)   Hunger Vital Sign    Worried About Running Out of Food in the Last Year: Never true    Ran Out of Food in the Last Year: Never true  Transportation Needs: No Transportation Needs (07/30/2023)   PRAPARE - Administrator, Civil Service (Medical): No    Lack of Transportation (Non-Medical): No  Physical Activity: Insufficiently Active (07/30/2023)   Exercise Vital Sign    Days of Exercise per Week: 2 days    Minutes of Exercise per Session: 20 min  Stress: No Stress Concern Present (07/30/2023)   [employer] of Occupational Health - Occupational Stress Questionnaire    Feeling of Stress : Only a little  Social Connections: Moderately Integrated (07/30/2023)   Social Connection and Isolation Panel [NHANES]    Frequency of Communication with Friends and Family: More than three times a week    Frequency of Social Gatherings with Friends and Family: More than three times a week    Attends Religious Services: More than 4 times per year    Active Member of [employer] or Organizations: Not on file    Attends  Banker Meetings: Never    Marital Status: Married  Catering manager Violence: Not At Risk (07/30/2023)   Humiliation, Afraid, Rape, and Kick questionnaire    Fear of Current or Ex-Partner: No    Emotionally Abused: No    Physically Abused: No    Sexually Abused: No    Review of Systems: See HPI, otherwise negative ROS  Physical Exam: BP 132/66   Temp (!) 97.2 F (36.2 C) (Temporal)   Resp 18   Ht 5\' 9"  (1.753 m)   SpO2 96%   BMI 44.60 kg/m  General:   Alert,  pleasant and cooperative in NAD Head:  Normocephalic and atraumatic. Neck:  Supple; no masses or thyromegaly. Lungs:  Clear throughout to auscultation, normal respiratory effort.    Heart:  +S1, +S2, Regular rate and rhythm, No edema. Abdomen:  Soft, nontender and nondistended. Normal bowel sounds, without guarding, and without rebound.   Neurologic:  Alert and  oriented x4;  grossly normal neurologically.  Impression/Plan: Eric Andrews is here for an endoscopy  to be performed for  evaluation of epigastric pain     Risks, benefits, limitations, and alternatives regarding endoscopy have been reviewed with the patient.  Questions have been answered.  All parties agreeable.   Wyline Mood, MD  08/18/2023, 10:13 AM

## 2023-08-18 NOTE — Addendum Note (Signed)
Addended by: Smitty Cords on: 08/18/2023 11:16 PM   Modules accepted: Level of Service

## 2023-08-18 NOTE — Op Note (Signed)
[facility] Gastroenterology Patient Name: Eric Andrews Procedure Date: 08/18/2023 10:33 AM MRN: [id number] Account #: [id number] Date of Birth: May 26, 1951 Admit Type: Outpatient Age: 72 Room: [facility] ENDO ROOM 3 Gender: Male Note Status: Finalized Instrument Name: Upper Endoscope [id number] Procedure:             Upper GI endoscopy Indications:           Dyspepsia Providers:             Wyline Mood MD, MD Referring MD:          Wyline Mood MD, MD (Referring MD), Smitty Cords (Referring MD) Medicines:             Monitored Anesthesia Care Complications:         No immediate complications. Procedure:             Pre-Anesthesia Assessment:                        - Prior to the procedure, a History and Physical was                         performed, and patient medications, allergies and                         sensitivities were reviewed. The patient's tolerance                         of previous anesthesia was reviewed.                        - The risks and benefits of the procedure and the                         sedation options and risks were discussed with the                         patient. All questions were answered and informed                         consent was obtained.                        - ASA Grade Assessment: II - A patient with mild                         systemic disease.                        After obtaining informed consent, the endoscope was                         passed under direct vision. Throughout the procedure,                         the patient's blood pressure, pulse, and oxygen                         saturations were monitored  continuously. The Endoscope                         was introduced through the mouth, and advanced to the                         third part of duodenum. The upper GI endoscopy was                         accomplished with ease. The patient tolerated the                          procedure well. Findings:      The esophagus was normal.      The examined duodenum was normal.      The entire examined stomach was normal. Biopsies were taken with a cold       forceps for histology.      The cardia and gastric fundus were normal on retroflexion. Impression:            - Normal esophagus.                        - Normal examined duodenum.                        - Normal stomach. Biopsied. Recommendation:        - Await pathology results.                        - Discharge patient to home (with escort).                        - Resume previous diet.                        - Continue present medications.                        - Return to GI office as previously scheduled. Procedure Code(s):     --- Professional ---                        [phone], Esophagogastroduodenoscopy, flexible,                         transoral; with biopsy, single or multiple Diagnosis Code(s):     --- Professional ---                        R10.13, Epigastric pain CPT copyright 2022 American Medical Association. All rights reserved. The codes documented in this report are preliminary and upon coder review may  be revised to meet current compliance requirements. Wyline Mood, MD Wyline Mood MD, MD 08/18/2023 10:45:55 AM This report has been signed electronically. Number of Addenda: 0 Note Initiated On: 08/18/2023 10:33 AM Estimated Blood Loss:  Estimated blood loss: none.      [facility]

## 2023-08-19 ENCOUNTER — Ambulatory Visit
Admission: RE | Admit: 2023-08-19 | Discharge: 2023-08-19 | Disposition: A | Discharge status: Home / Self Care | Payer: Medicare Other | Source: Ambulatory Visit | Attending: Family Medicine | Admitting: Family Medicine

## 2023-08-19 ENCOUNTER — Encounter: Payer: Self-pay | Admitting: Gastroenterology

## 2023-08-19 DIAGNOSIS — N5089 Other specified disorders of the male genital organs: Secondary | ICD-10-CM | POA: Diagnosis present

## 2023-08-19 DIAGNOSIS — Z310 Encounter for reversal of previous sterilization: Secondary | ICD-10-CM | POA: Diagnosis present

## 2023-08-19 LAB — SURGICAL PATHOLOGY

## 2023-08-20 ENCOUNTER — Encounter: Payer: Self-pay | Admitting: Gastroenterology

## 2023-08-22 ENCOUNTER — Other Ambulatory Visit: Payer: Self-pay | Admitting: Family Medicine

## 2023-08-22 ENCOUNTER — Encounter: Payer: Self-pay | Admitting: Family Medicine

## 2023-08-22 DIAGNOSIS — F331 Major depressive disorder, recurrent, moderate: Secondary | ICD-10-CM

## 2023-08-23 ENCOUNTER — Encounter: Payer: Self-pay | Admitting: Family Medicine

## 2023-08-24 ENCOUNTER — Encounter: Payer: Self-pay | Admitting: Pulmonary Disease

## 2023-08-24 ENCOUNTER — Ambulatory Visit: Payer: Medicare Other | Admitting: Pulmonary Disease

## 2023-08-24 ENCOUNTER — Other Ambulatory Visit: Payer: Self-pay | Admitting: Family Medicine

## 2023-08-24 VITALS — BP 122/78 | HR 76 | Temp 98.0°F | Ht 69.0 in | Wt 304.2 lb

## 2023-08-24 DIAGNOSIS — G4733 Obstructive sleep apnea (adult) (pediatric): Secondary | ICD-10-CM

## 2023-08-24 DIAGNOSIS — M5136 Other intervertebral disc degeneration, lumbar region: Secondary | ICD-10-CM

## 2023-08-24 DIAGNOSIS — Z6841 Body Mass Index (BMI) 40.0 and over, adult: Secondary | ICD-10-CM | POA: Diagnosis not present

## 2023-08-24 DIAGNOSIS — R0602 Shortness of breath: Secondary | ICD-10-CM | POA: Diagnosis not present

## 2023-08-24 NOTE — Progress Notes (Signed)
Subjective:    Patient ID: Eric Andrews, male    DOB: Jun 14, 1951, 72 y.o.   MRN: [id number]  Patient Care Team: Smitty Cords, DO as PCP - General (Family Medicine) Ronney Asters, Jackelyn Poling, RPH-CPP (Pharmacist)  Chief Complaint  Patient presents with   Follow-up    Breathing is good.No SOB, wheezing or cough. CPAP is good. No problems.    HPI Patient is a 72 year old lifelong never smoker with a history of obstructive sleep apnea and issues with dyspnea who follows for the same.  This is a scheduled visit.  He was last seen here on 16 September 2022.  Patient is maintained on an auto CPAP of 5 to 20 cm H2O and tolerating it well.  He states that he gets restful sleep and is able to sleep all night with it.  His compliance is 100% by his report which is from 24 July 2023 through 22 August 2023, average pressures are around 9 to 11 cm of water pressure. AHI 0.8 on therapy.   Since his prior visit he has not had any issues with fevers, chills or sweats.  No cough or sputum production.  No wheezing.  Notes that his dyspnea is markedly better.  He continues to be very compliant with his CPAP.  He does not endorse any other symptomatology.   He feels overall that he is doing well.  Continues to struggle with his weight issues.   DATA 07/15/2016 Sleep Study [facility]): AHI 13.5/h REM AHI being 46.5/h.  Lowest O2 saturation 82%. 10/16/2020 echocardiogram: LVEF 55 to 60% grade 1 DD, normal RV function.  No valvular abnormalities. 10/29/2020 PFTs: FEV1 3.06 L or 88% predicted, FVC 3.85 L or 82% predicted, lung volumes normal except for ERV that it is 2% (consistent with obesity), diffusion capacity normal.  Review of Systems A 10 point review of systems was performed and it is as noted above otherwise negative.   Patient Active Problem List   Diagnosis Date Noted   Abdominal pain, epigastric 08/18/2023   Colon cancer screening 03/22/2023   Adenomatous polyp of colon  03/22/2023   Osteoarthritis of glenohumeral joint, left 01/20/2023   Status post total replacement of left shoulder 01/20/2023   S/P TKR (total knee replacement) using cement, left 04/21/2022   Bilateral primary osteoarthritis of knee 12/17/2021   Gastroesophageal reflux disease without esophagitis 07/08/2021   Environmental and seasonal allergies 02/27/2021   Hyperlipidemia associated with type 2 diabetes mellitus (HCC) 02/27/2021   Morbid obesity with BMI of 40.0-44.9, adult (HCC) 06/26/2020   Major depressive disorder, recurrent, moderate (HCC) 06/25/2020   GAD (generalized anxiety disorder) 06/25/2020   Type 2 diabetes mellitus with other specified complication (HCC) 05/27/2020   Chronic pain of both knees 05/27/2020   Primary osteoarthritis involving multiple joints 05/27/2020   Cataract associated with type 2 diabetes mellitus (HCC) 05/27/2020   History of torn meniscus of left knee 05/27/2020   DDD (degenerative disc disease), lumbar 05/27/2020    Social History   Tobacco Use   Smoking status: Never   Smokeless tobacco: Never  Substance Use Topics   Alcohol use: Not Currently    Allergies  Allergen Reactions   Dust Mite Extract     Current Meds  Medication Sig   Ascorbic Acid (VITAMIN C) 1000 MG tablet Take 1,000 mg by mouth daily.   aspirin EC 81 MG tablet Take 81 mg by mouth daily. Swallow whole.   atorvastatin (LIPITOR) 20 MG tablet Take 1 tablet (20  mg total) by mouth at bedtime.   busPIRone (BUSPAR) 15 MG tablet Take 15 mg by mouth daily.   celecoxib (CELEBREX) 200 MG capsule TAKE 1 CAPSULE(200 MG) BY MOUTH TWICE DAILY   cetirizine (ZYRTEC) 10 MG tablet TAKE 1 TABLET(10 MG) BY MOUTH AT BEDTIME   chlorthalidone (HYGROTON) 25 MG tablet Take 1 tablet (25 mg total) by mouth daily.   cholecalciferol (VITAMIN D3) 25 MCG (1000 UNIT) tablet Take 1,000 Units by mouth in the morning and at bedtime.   FARXIGA 10 MG TABS tablet Take 1 tablet (10 mg total) by mouth daily  before breakfast.   fenofibrate micronized (LOFIBRA) 134 MG capsule Take 1 capsule (134 mg total) by mouth daily before breakfast. TAKE 1 CAPSULE(134 MG) BY MOUTH DAILY BEFORE BREAKFAST   finasteride (PROSCAR) 5 MG tablet TAKE 1 TABLET(5 MG) BY MOUTH EVERY EVENING   fluticasone (FLONASE) 50 MCG/ACT nasal spray INSTILL 2 SPRAYS INTO BOTH NOSTRILS EVERY DAY   hydrocortisone 2.5 % cream APPLY TOPICALLY TO THE AFFECTED AREA OF FACE AND EARS 3 days a week Monday, Wednesday, Friday   insulin degludec (TRESIBA FLEXTOUCH) 100 UNIT/ML FlexTouch Pen Inject 68 Units into the skin at bedtime. (Patient taking differently: Inject 50 Units into the skin every evening.)   Insulin Pen Needle 31G X 5 MM MISC Use with insulin to inject into skin 3 times daily as directed   ketoconazole (NIZORAL) 2 % cream APPLY EXTERNALLY TO THE AFFECTED AREA OF FACE AND EARS 3 days a week on TUESDAY, THURSDAY, AND SATURDAY   losartan (COZAAR) 50 MG tablet Take 1 tablet (50 mg total) by mouth daily.   metFORMIN (GLUCOPHAGE-XR) 500 MG 24 hr tablet Take 2 tablets (1,000 mg total) by mouth 2 (two) times daily with a meal. Add refills for future   Multiple Vitamin (MULTIVITAMIN) capsule Take 1 capsule by mouth daily.   NOVOLOG FLEXPEN 100 UNIT/ML FlexPen Inject 14 Units into the skin 2 (two) times daily. Sliding scale   Omega-3 Fatty Acids (FISH OIL) 1200 MG CAPS Take 1,200 mg by mouth in the morning and at bedtime.   omeprazole (PRILOSEC) 20 MG capsule Take 20 mg by mouth every morning.   OZEMPIC, 1 MG/DOSE, 4 MG/3ML SOPN Inject 1 mg into the skin once a week.   pioglitazone (ACTOS) 15 MG tablet Take 1 tablet (15 mg total) by mouth daily. OFFICE VISIT NEEDED FOR ADDITIONAL REFILLS, CALL OFFICE TO SCHEDULE   pregabalin (LYRICA) 50 MG capsule Take 50 mg by mouth in the morning, at noon, in the evening, and at bedtime.   tamsulosin (FLOMAX) 0.4 MG CAPS capsule TAKE 1 CAPSULE BY MOUTH EVERY NIGHT AT BEDTIME FOR URINE RETENTION   traMADol  (ULTRAM) 50 MG tablet Take 50 mg by mouth 3 (three) times daily as needed.   traZODone (DESYREL) 100 MG tablet Take 1.5 tablets (150 mg total) by mouth at bedtime. (Patient taking differently: Take 200 mg by mouth at bedtime.)   venlafaxine XR (EFFEXOR-XR) 150 MG 24 hr capsule TAKE 1 CAPSULE(150 MG) BY MOUTH DAILY WITH BREAKFAST    Immunization History  Administered Date(s) Administered   Fluad Quad(high Dose 65+) 10/10/2020, 11/06/2021, 09/28/2022   Moderna Sars-Covid-2 Vaccination 02/09/2020, 03/13/2020   PFIZER SARS-COV-2 Pediatric Vaccination 5-66yrs 10/18/2020, 04/30/2021   PNEUMOCOCCAL CONJUGATE-20 11/06/2021   Tdap 01/16/2018        Objective:     BP 122/78 (BP Location: Right Arm, Cuff Size: Large)   Pulse 76   Temp 98 F (36.7 C)  Ht 5\' 9"  (1.753 m)   Wt (!) 304 lb 3.2 oz (138 kg)   SpO2 96%   BMI 44.92 kg/m   SpO2: 96 % O2 Device: None (Room air)  GENERAL: Morbidly obese man, no acute distress.  Fully ambulatory. HEAD: Normocephalic, atraumatic.  EYES: Pupils equal, round, reactive to light.  No scleral icterus.  MOUTH: Oral mucosa moist, no thrush. NECK: Supple. No thyromegaly. Trachea midline. No JVD.  No adenopathy. PULMONARY: Good air entry bilaterally.  No adventitious sounds. CARDIOVASCULAR: S1 and S2. Regular rate and rhythm.  ABDOMEN: Massive truncal obesity. MUSCULOSKELETAL: No joint deformity, no clubbing, no edema.  NEUROLOGIC: No focal deficit, no gait disturbance, speech is fluent. SKIN: Intact,warm,dry.  No rashes noted. PSYCH: Mood and behavior normal.  Assessment & Plan:     ICD-10-CM   1. OSA (obstructive sleep apnea)  G47.33 AMB REFERRAL FOR DME   Compliant with CPAP Continue CPAP at 5 to 20 cm H2O (AutoSet) Will need to switch from [employer] to [employer] due to insurance    2. Shortness of breath  R06.02    Resolved Mostly related to obesity/deconditioning    3. Morbid obesity with BMI of 40.0-44.9, adult (HCC)  E66.01    Z68.41     Weight loss recommended      Orders Placed This Encounter  Procedures   AMB REFERRAL FOR DME    Referral Priority:   Routine    Referral Type:   Durable Medical Equipment Purchase    Number of Visits Requested:   1   Will see the patient in follow-up in 6 months time he is to contact us prior to that time should any new difficulties arise.  He can see me or one of our nurse practitioners at that time.   Gailen Shelter, MD Advanced [facility]ch[facility]    *This note was dictated using voice recognition software/Dragon.  Despite best efforts to proofread, errors can occur which can change the meaning. Any transcriptional errors that result from this process are unintentional and may not be fully corrected at the time of dictation.

## 2023-08-24 NOTE — Patient Instructions (Addendum)
Your compliance with the CPAP is excellent.  Continue the CPAP as you are doing.  We will see you in follow-up in 6 months time, with me or the nurse practitioner at that time.   Su cumplimiento del CPAP es excelente.  Contine con la CPAP como lo est haciendo.  Lo veremos en el seguimiento dentro de 6 meses, conmigo o con la enfermera practicante en ese momento.

## 2023-08-30 ENCOUNTER — Other Ambulatory Visit: Payer: Self-pay | Admitting: Dermatology

## 2023-08-30 DIAGNOSIS — L219 Seborrheic dermatitis, unspecified: Secondary | ICD-10-CM

## 2023-09-02 DIAGNOSIS — G4733 Obstructive sleep apnea (adult) (pediatric): Secondary | ICD-10-CM | POA: Diagnosis not present

## 2023-09-20 ENCOUNTER — Other Ambulatory Visit: Payer: Self-pay | Admitting: Family Medicine

## 2023-09-20 DIAGNOSIS — I10 Essential (primary) hypertension: Secondary | ICD-10-CM

## 2023-09-22 ENCOUNTER — Other Ambulatory Visit: Payer: Self-pay | Admitting: Family Medicine

## 2023-09-22 DIAGNOSIS — E785 Hyperlipidemia, unspecified: Secondary | ICD-10-CM

## 2023-09-22 DIAGNOSIS — N401 Enlarged prostate with lower urinary tract symptoms: Secondary | ICD-10-CM

## 2023-09-22 DIAGNOSIS — E1169 Type 2 diabetes mellitus with other specified complication: Secondary | ICD-10-CM

## 2023-09-23 NOTE — Telephone Encounter (Signed)
Requested medications are due for refill today.  yes  Requested medications are on the active medications list.  yes  Last refill. 08/11/2022 90/3  Future visit scheduled.   yes  Notes to clinic.  Expired labs.    Requested Prescriptions  Pending Prescriptions Disp Refills   fenofibrate micronized (LOFIBRA) 134 MG capsule [Pharmacy Med Name: FENOFIBRATE 134MG  CAPSULES] 90 capsule 3    Sig: TAKE 1 CAPSULE(134 MG) BY MOUTH DAILY BEFORE BREAKFAST     Cardiovascular:  Antilipid - Fibric Acid Derivatives Failed - 09/22/2023  4:18 PM      Failed - ALT in normal range and within 360 days    ALT  Date Value Ref Range Status  05/19/2023 47 (H) 0 - 44 IU/L Final         Failed - Lipid Panel in normal range within the last 12 months    Cholesterol  Date Value Ref Range Status  07/01/2021 122 <200 mg/dL Final   LDL Cholesterol (Calc)  Date Value Ref Range Status  07/01/2021 62 mg/dL (calc) Final    Comment:    Reference range: <100 . Desirable range <100 mg/dL for primary prevention;   <70 mg/dL for patients with CHD or diabetic patients  with > or = 2 CHD risk factors. Marland Kitchen LDL-C is now calculated using the Martin-Hopkins  calculation, which is a validated novel method providing  better accuracy than the Friedewald equation in the  estimation of LDL-C.  Horald Pollen et al. Lenox Ahr. 9147;829(56): 2061-2068  (http://education.[employer].com/faq/FAQ164)    HDL  Date Value Ref Range Status  07/01/2021 33 (L) > OR = 40 mg/dL Final   Triglycerides  Date Value Ref Range Status  07/01/2021 209 (H) <150 mg/dL Final    Comment:    . If a non-fasting specimen was collected, consider repeat triglyceride testing on a fasting specimen if clinically indicated.  Perry Mount et al. J. of Clin. Lipidol. 2015;9:129-169. Marland Kitchen          Passed - AST in normal range and within 360 days    AST  Date Value Ref Range Status  05/19/2023 39 0 - 40 IU/L Final         Passed - Cr in normal range  and within 360 days    Creat  Date Value Ref Range Status  07/01/2021 1.12 0.70 - 1.35 mg/dL Final   Creatinine, Ser  Date Value Ref Range Status  05/19/2023 1.00 0.76 - 1.27 mg/dL Final         Passed - HGB in normal range and within 360 days    Hemoglobin  Date Value Ref Range Status  05/19/2023 15.0 13.0 - 17.7 g/dL Final         Passed - HCT in normal range and within 360 days    Hematocrit  Date Value Ref Range Status  05/19/2023 46.9 37.5 - 51.0 % Final         Passed - PLT in normal range and within 360 days    Platelets  Date Value Ref Range Status  12/22/2022 168 150 - 400 K/uL Final         Passed - WBC in normal range and within 360 days    WBC  Date Value Ref Range Status  05/19/2023 5.9 3.4 - 10.8 x10E3/uL Final  12/22/2022 6.4 4.0 - 10.5 K/uL Final         Passed - eGFR is 30 or above and within 360 days    GFR, Estimated  Date Value Ref Range Status  12/22/2022 >60 >60 mL/min Final    Comment:    (NOTE) Calculated using the CKD-EPI Creatinine Equation (2021)    eGFR  Date Value Ref Range Status  05/19/2023 80 >59 mL/min/1.73 Final         Passed - Valid encounter within last 12 months    Recent Outpatient Visits           1 month ago Type 2 diabetes mellitus with other specified complication, with long-term current use of insulin [facility])   [place] [facility] [place], Netta Neat, DO   4 months ago Otalgia of right ear   [place] [facility] [place] [place], Netta Neat, DO   4 months ago Otalgia of right ear   [place] [facility] [place], Salvadore [place], NP   7 months ago Screening for colon cancer   [place] [facility] [place], Netta Neat, DO   8 months ago Type 2 diabetes mellitus with other specified complication, with long-term current use of insulin [facility])   [place] [facility] [place], Jackelyn Poling, RPH-CPP       Future Appointments              In 1 month Stoioff, Verna Czech, MD [facility] [place]   In 5 months Althea Charon, Netta Neat, DO [place] [facility], [facility]   In 6 months Deirdre Evener, MD [place] [facility]             tamsulosin (FLOMAX) 0.4 MG CAPS capsule [Pharmacy Med Name: TAMSULOSIN 0.4MG  CAPSULES] 90 capsule 3    Sig: TAKE 1 CAPSULE BY MOUTH EVERY NIGHT AT BEDTIME FOR URINE RETENTION     Urology: Alpha-Adrenergic Blocker Failed - 09/22/2023  4:18 PM      Failed - PSA in normal range and within 360 days    PSA  Date Value Ref Range Status  07/01/2021 0.10 < OR = 4.00 ng/mL Final    Comment:    The total PSA value from this assay system is  standardized against the WHO standard. The test  result will be approximately 20% lower when compared  to the equimolar-standardized total PSA (Beckman  Coulter). Comparison of serial PSA results should be  interpreted with this fact in mind. . This test was performed using the Siemens  chemiluminescent method. Values obtained from  different assay methods cannot be used interchangeably. PSA levels, regardless of value, should not be interpreted as absolute evidence of the presence or absence of disease.          Passed - Last BP in normal range    BP Readings from Last 1 Encounters:  08/24/23 122/78         Passed - Valid encounter within last 12 months    Recent Outpatient Visits           1 month ago Type 2 diabetes mellitus with other specified complication, with long-term current use of insulin [facility])   [place] [facility] [place], Netta Neat, DO   4 months ago Otalgia of right ear   [place] [facility] [place], Netta Neat, DO   4 months ago Otalgia of right ear   [facility] [place], Salvadore [place], NP   7 months ago Screening for colon cancer    [facility] [place], Netta Neat,  DO   8 months ago  Type 2 diabetes mellitus with other specified complication, with long-term current use of insulin [facility])   [facility] [place], Jackelyn Poling, RPH-CPP       Future Appointments             In 1 month Stoioff, Verna Czech, MD [facility] [place]   In 5 months Althea Charon, Netta Neat, DO [place], [place]   In 6 months Deirdre Evener, MD [facility]

## 2023-09-24 ENCOUNTER — Other Ambulatory Visit: Payer: Self-pay | Admitting: Family Medicine

## 2023-09-24 DIAGNOSIS — K219 Gastro-esophageal reflux disease without esophagitis: Secondary | ICD-10-CM

## 2023-09-24 NOTE — Telephone Encounter (Signed)
Requested medications are due for refill today.  Unsure  Requested medications are on the active medications list.  yes  Last refill. 06/21/2023   Future visit scheduled.   yes  Notes to clinic.  Medication is historical.    Requested Prescriptions  Pending Prescriptions Disp Refills   omeprazole (PRILOSEC) 20 MG capsule [Pharmacy Med Name: OMEPRAZOLE 20MG  CAPSULES] 90 capsule     Sig: TAKE 1 CAPSULE BY MOUTH DAILY BEFORE BREAKFAST     Gastroenterology: Proton Pump Inhibitors Passed - 09/24/2023  1:24 PM      Passed - Valid encounter within last 12 months    Recent Outpatient Visits           1 month ago Type 2 diabetes mellitus with other specified complication, with long-term current use of insulin [facility])   [place] [facility] [place], Netta Neat, DO   4 months ago Otalgia of right ear   [place] [facility] [place], Netta Neat, DO   4 months ago Otalgia of right ear   [place] [facility] [place], Salvadore [place], NP   7 months ago Screening for colon cancer   [place] [facility] [place], Netta Neat, DO   8 months ago Type 2 diabetes mellitus with other specified complication, with long-term current use of insulin [facility])   [place] [facility] Delles, Jackelyn Poling, RPH-CPP       Future Appointments             In 1 month Stoioff, Verna Czech, MD [facility] [place]   In 5 months Althea Charon, Netta Neat, DO [place] [facility], [place]   In 6 months Deirdre Evener, MD [facility]

## 2023-10-02 DIAGNOSIS — G4733 Obstructive sleep apnea (adult) (pediatric): Secondary | ICD-10-CM | POA: Diagnosis not present

## 2023-10-04 ENCOUNTER — Encounter: Payer: Self-pay | Admitting: Family Medicine

## 2023-10-04 ENCOUNTER — Ambulatory Visit (INDEPENDENT_AMBULATORY_CARE_PROVIDER_SITE_OTHER): Payer: Medicare Other | Admitting: Family Medicine

## 2023-10-04 VITALS — BP 122/78 | HR 79 | Resp 19 | Ht 69.0 in | Wt 301.0 lb

## 2023-10-04 DIAGNOSIS — Z794 Long term (current) use of insulin: Secondary | ICD-10-CM

## 2023-10-04 DIAGNOSIS — S90121A Contusion of right lesser toe(s) without damage to nail, initial encounter: Secondary | ICD-10-CM | POA: Diagnosis not present

## 2023-10-04 DIAGNOSIS — E1169 Type 2 diabetes mellitus with other specified complication: Secondary | ICD-10-CM | POA: Diagnosis not present

## 2023-10-04 DIAGNOSIS — Z23 Encounter for immunization: Secondary | ICD-10-CM

## 2023-10-04 NOTE — Progress Notes (Signed)
Subjective:    Patient ID: Eric Andrews, male    DOB: 08/31/1951, 72 y.o.   MRN: [id number]  Eric Andrews is a 72 y.o. male presenting on 10/04/2023 for Right Foot (Toes sore after hitting them on the bed. )   HPI  Discussed the use of AI scribe software for clinical note transcription with the patient, who gave verbal consent to proceed.  The patient presented with concerns about discoloration and soreness in his right foot, particularly in the pinky toe. He noticed a black spot and worsening discoloration over the past week. The patient recalled hitting his foot on the leg of his bed three weeks prior to the appointment, which he believes may have caused bruising. The patient reported that the pain was not severe and he was able to put pressure on the foot and wiggle his toes. He was primarily concerned about the discoloration and potential circulation issues.  The patient also mentioned a history of kidney stones and has been scheduled for a yearly check with a Urology specialist. He reported that he continuously passes stones, attributing this to having wider tubes. The patient had a kidney stone incident in the past where a stone got stuck.  The patient received a flu shot prior to the consultation.           08/17/2023    9:45 AM 07/30/2023    2:54 PM 11/23/2022   11:45 AM  Depression screen PHQ 2/9  Decreased Interest 1  2  Down, Depressed, Hopeless 0 1 1  PHQ - 2 Score 1 1 3   Altered sleeping 1  1  Tired, decreased energy 2 1 1   Change in appetite   3  Feeling bad or failure about yourself  2  1  Trouble concentrating 2  3  Moving slowly or fidgety/restless 0  1  Suicidal thoughts 0  0  PHQ-9 Score 8  13  Difficult doing work/chores Not difficult at all Not difficult at all Somewhat difficult    Social History   Tobacco Use   Smoking status: Never   Smokeless tobacco: Never  Vaping Use   Vaping status: Never Used  Substance Use Topics   Alcohol use: Not Currently    Drug use: Never    Review of Systems Per HPI unless specifically indicated above     Objective:    BP 122/78 (BP Location: Right Arm, Patient Position: Sitting, Cuff Size: Normal)   Pulse 79   Resp 19   Ht 5\' 9"  (1.753 m)   Wt (!) 301 lb (136.5 kg)   BMI 44.45 kg/m   Wt Readings from Last 3 Encounters:  10/04/23 (!) 301 lb (136.5 kg)  08/24/23 (!) 304 lb 3.2 oz (138 kg)  08/17/23 (!) 302 lb (137 kg)    Physical Exam Vitals and nursing note reviewed.  Constitutional:      General: He is not in acute distress.    Appearance: Normal appearance. He is well-developed. He is not diaphoretic.     Comments: Well-appearing, comfortable, cooperative  HENT:     Head: Normocephalic and atraumatic.  Eyes:     General:        Right eye: No discharge.        Left eye: No discharge.     Conjunctiva/sclera: Conjunctivae normal.  Cardiovascular:     Rate and Rhythm: Normal rate.     Pulses: Normal pulses.  Pulmonary:     Effort: Pulmonary effort is normal.  Musculoskeletal:  Right lower leg: No edema.     Left lower leg: No edema.     Comments: R foot with slight ecchymosis residual discoloration at base of middle toes, non tender, full range of motion.  Skin:    General: Skin is warm and dry.     Findings: No erythema or rash.  Neurological:     Mental Status: He is alert and oriented to person, place, and time.  Psychiatric:        Mood and Affect: Mood normal.        Behavior: Behavior normal.        Thought Content: Thought content normal.     Comments: Well groomed, good eye contact, normal speech and thoughts    Results for orders placed or performed in visit on 08/23/23  Microalbumin / creatinine urine ratio  Result Value Ref Range   Microalb Creat Ratio 29.99       Assessment & Plan:   Problem List Items Addressed This Visit   None Visit Diagnoses     Traumatic ecchymosis of toe of right foot, initial encounter    -  Primary   Needs flu shot        Relevant Orders   Flu Vaccine Trivalent High Dose (Fluad) (Completed)      Assessment and Plan    Foot Trauma Noted discoloration and mild pain in toes following a traumatic event three weeks ago. No signs of poor circulation or significant injury. -Expectant management with spontaneous resolution of bruising.  Kidney Stones History of kidney stones, but no current symptoms. -Continue with scheduled follow-up with Dr. Lonna Cobb for routine surveillance.  General Health Maintenance -Flu shot administered today. -Follow-up appointment scheduled for March 2024.        Orders Placed This Encounter  Procedures   Flu Vaccine Trivalent High Dose (Fluad)      No orders of the defined types were placed in this encounter.     Follow up plan: Return if symptoms worsen or fail to improve.   Saralyn Pilar, DO [facility] 10/04/2023, 10:56 AM

## 2023-10-04 NOTE — Patient Instructions (Addendum)
Thank you for coming to the office today.  Right Foot likely a contusion or jammed toes. No sign of fracture or circulation defect.  Keep up the good work. No changes to treatment  I see no reduced circulation. No further imaging  Flu Shot done today  Keep up with specialists and our apt in March 2024  Please schedule a Follow-up Appointment to: Return if symptoms worsen or fail to improve.  If you have any other questions or concerns, please feel free to call the office or send a message through MyChart. You may also schedule an earlier appointment if necessary.  Additionally, you may be receiving a survey about your experience at our office within a few days to 1 week by e-mail or mail. We value your feedback.  Saralyn Pilar, DO [facility], [place]

## 2023-10-12 ENCOUNTER — Other Ambulatory Visit: Payer: Self-pay | Admitting: Family Medicine

## 2023-10-12 DIAGNOSIS — E1169 Type 2 diabetes mellitus with other specified complication: Secondary | ICD-10-CM

## 2023-10-12 DIAGNOSIS — J3089 Other allergic rhinitis: Secondary | ICD-10-CM

## 2023-10-13 NOTE — Telephone Encounter (Signed)
Requested medication (s) are due for refill today:   Yes for both  Requested medication (s) are on the active medication list:   Yes for both  Future visit scheduled:   Yes   Last ordered: Both 08/11/2022 #90, 3 refills each  Unable to refill because lipid panel is due for the atorvastatin.      Requested Prescriptions  Pending Prescriptions Disp Refills   cetirizine (ZYRTEC) 10 MG tablet [Pharmacy Med Name: CETIRIZINE 10MG  TABLETS] 90 tablet 3    Sig: TAKE 1 TABLET(10 MG) BY MOUTH AT BEDTIME     Ear, Nose, and Throat:  Antihistamines 2 Passed - 10/12/2023  8:01 PM      Passed - Cr in normal range and within 360 days    Creat  Date Value Ref Range Status  07/01/2021 1.12 0.70 - 1.35 mg/dL Final   Creatinine, Ser  Date Value Ref Range Status  05/19/2023 1.00 0.76 - 1.27 mg/dL Final         Passed - Valid encounter within last 12 months    Recent Outpatient Visits           1 week ago Type 2 diabetes mellitus with other specified complication, with long-term current use of insulin (HCC)   [place] [facility] [place], Netta Neat, DO   1 month ago Type 2 diabetes mellitus with other specified complication, with long-term current use of insulin [facility])   [place] [facility] [place], Netta Neat, DO   4 months ago Otalgia of right ear   [place] [facility] [place], Netta Neat, DO   4 months ago Otalgia of right ear   [place] [facility] [place], Salvadore Oxford, NP   7 months ago Screening for colon cancer   [place] [facility] Smitty Cords, DO       Future Appointments             In 3 weeks Stoioff, Verna Czech, MD [facility] [place]   In 4 months Althea Charon, Netta Neat, DO [place] [facility]   In 5 months Deirdre Evener, MD [place] [facility]             atorvastatin (LIPITOR) 20 MG tablet [Pharmacy  Med Name: ATORVASTATIN 20MG  TABLETS] 90 tablet 3    Sig: TAKE 1 TABLET(20 MG) BY MOUTH AT BEDTIME     Cardiovascular:  Antilipid - Statins Failed - 10/12/2023  8:01 PM      Failed - Lipid Panel in normal range within the last 12 months    Cholesterol  Date Value Ref Range Status  07/01/2021 122 <200 mg/dL Final   LDL Cholesterol (Calc)  Date Value Ref Range Status  07/01/2021 62 mg/dL (calc) Final    Comment:    Reference range: <100 . Desirable range <100 mg/dL for primary prevention;   <70 mg/dL for patients with CHD or diabetic patients  with > or = 2 CHD risk factors. Marland Kitchen LDL-C is now calculated using the Martin-Hopkins  calculation, which is a validated novel method providing  better accuracy than the Friedewald equation in the  estimation of LDL-C.  Horald Pollen et al. Lenox Ahr. 4696;295(28): 2061-2068  (http://education.QuestDiagnostics.com/faq/FAQ164)    HDL  Date Value Ref Range Status  07/01/2021 33 (L) > OR = 40 mg/dL Final   Triglycerides  Date Value Ref Range Status  07/01/2021 209 (H) <150 mg/dL  Final    Comment:    . If a non-fasting specimen was collected, consider repeat triglyceride testing on a fasting specimen if clinically indicated.  Perry Mount et al. J. of Clin. Lipidol. 2015;9:129-169. Marland Kitchen          Passed - Patient is not pregnant      Passed - Valid encounter within last 12 months    Recent Outpatient Visits           1 week ago Type 2 diabetes mellitus with other specified complication, with long-term current use of insulin [facility])   [facility] [place], Netta Neat, DO   1 month ago Type 2 diabetes mellitus with other specified complication, with long-term current use of insulin [facility])   [place] [facility] [place], Netta Neat, DO   4 months ago Otalgia of right ear   [place] [facility] Smitty Cords, DO   4 months ago Otalgia of right ear   [place] [facility] [place], Salvadore Oxford, NP   7 months ago Screening for colon cancer   [place] [facility] Smitty Cords, DO       Future Appointments             In 3 weeks Stoioff, Verna Czech, MD [facility]rology [place]   In 4 months Althea Charon, Netta Neat, DO  [facility] [place], [place]   In 5 months Deirdre Evener, MD [facility]

## 2023-10-15 ENCOUNTER — Ambulatory Visit: Payer: Medicare Other | Admitting: Pharmacist

## 2023-10-15 DIAGNOSIS — Z794 Long term (current) use of insulin: Secondary | ICD-10-CM

## 2023-10-15 DIAGNOSIS — E1169 Type 2 diabetes mellitus with other specified complication: Secondary | ICD-10-CM

## 2023-10-15 NOTE — Progress Notes (Signed)
10/15/2023 Name: Keaon Andrews MRN: [id number] DOB: 09-19-1951  Chief Complaint  Patient presents with   Medication Assistance   Medication Management    Eric Andrews is a 72 y.o. year old male who presented for a telephone visit.   They were referred to the pharmacist by their PCP for assistance in managing medication access.    Subjective:  Care Team: Primary Care Provider: Smitty Cords, DO; Next Scheduled Visit: 02/22/2024 Urology: Riki Altes, MD; Next Scheduled Visit: 11/03/2023 Neurology: Alphonzo Lemmings, MD; Next Scheduled Visit: 12/21/2023 [facility] Pul[facility]ology: Salena Saner, MD; Next Scheduled Visit: 10/29/2023  Medication Access/Adherence  Current Pharmacy:  [id number] - [place], [place] - [address] AT [facility] OF [address] [place] [address] [place] [address] Phone: [phone] Fax: [phone]   Patient reports affordability concerns with their medications: No  Patient reports access/transportation concerns to their pharmacy: No  Patient reports adherence concerns with their medications:  No       Diabetes:   Current medications:  Tresiba 50 units daily in evening Novolog 14 units three times daily with meals  Metformin ER 500 mg - 2 tablets (1000 mg) twice daily Pioglitazone 15 mg once daily Farxiga 10 mg daily  Ozempic 1 mg weekly   Current glucose readings: recalls recent morning fasting ranging 110-130   Patient denies hypoglycemic s/sx including dizziness, shakiness, sweating.    Current physical activity: working on upper body exercises and using under desk pedaler daily and yard work some days   Current medication access support: Patient enrolled in assistance programs from [employer] for [place], [place] and from [employer] for [place] Deist - Reports has sufficient supply of these medications to last through end of calendar year - Reports  received letter from [employer] advising that he was denied for [place] patient assistance re-enrollment based on his income, but that the income has not changed  Objective:  Lab Results  Component Value Date   HGBA1C 6.9 (A) 08/17/2023    Lab Results  Component Value Date   CREATININE 1.00 05/19/2023   BUN 20 05/19/2023   NA 141 05/19/2023   K 4.3 05/19/2023   CL 103 05/19/2023   CO2 24 05/19/2023    Lab Results  Component Value Date   CHOL 122 07/01/2021   HDL 33 (L) 07/01/2021   LDLCALC 62 07/01/2021   TRIG 209 (H) 07/01/2021   CHOLHDL 3.7 07/01/2021   BP Readings from Last 3 Encounters:  10/04/23 122/78  08/24/23 122/78  08/18/23 115/74   Pulse Readings from Last 3 Encounters:  10/04/23 79  08/24/23 76  08/17/23 66     Medications Reviewed Today     Reviewed by Manuela Neptune, RPH-CPP (Pharmacist) on 10/15/23 at 1116  Med List Status: <None>   Medication Order Taking? Sig Documenting Provider Last Dose Status Informant  Ascorbic Acid (VITAMIN C) 1000 MG tablet [id number] Yes Take 1,000 mg by mouth daily. [provider] Taking Active Self  aspirin EC 81 MG tablet [id number] Yes Take 81 mg by mouth daily. Swallow whole. [provider] Taking Active   atorvastatin (LIPITOR) 20 MG tablet [id number] Yes TAKE 1 TABLET(20 MG) BY MOUTH AT BEDTIME Karamalegos, Netta Neat, DO Taking Active   busPIRone (BUSPAR) 15 MG tablet [id number] Yes Take 15 mg by mouth daily. [provider] Taking Active   celecoxib (CELEBREX) 200 MG capsule [id number] Yes TAKE 1  CAPSULE(200 MG) BY MOUTH TWICE DAILY Smitty Cords, DO Taking Active   cetirizine (ZYRTEC) 10 MG tablet [id number] Yes TAKE 1 TABLET(10 MG) BY MOUTH AT BEDTIME Smitty Cords, DO Taking Active   chlorthalidone (HYGROTON) 25 MG tablet [id number] Yes TAKE 1 TABLET(25 MG) BY MOUTH DAILY Karamalegos, Netta Neat, DO Taking Active   cholecalciferol (VITAMIN D3) 25 MCG (1000 UNIT)  tablet [id number] Yes Take 1,000 Units by mouth in the morning and at bedtime. [provider] Taking Active Self  FARXIGA 10 MG TABS tablet [id number] Yes Take 1 tablet (10 mg total) by mouth daily before breakfast. Smitty Cords, DO Taking Active   fenofibrate micronized (LOFIBRA) 134 MG capsule [id number] Yes TAKE 1 CAPSULE(134 MG) BY MOUTH DAILY BEFORE BREAKFAST Karamalegos, Netta Neat, DO Taking Active   finasteride (PROSCAR) 5 MG tablet [id number] Yes TAKE 1 TABLET(5 MG) BY MOUTH EVERY EVENING Karamalegos, Netta Neat, DO Taking Active   fluticasone (FLONASE) 50 MCG/ACT nasal spray [id number] Yes INSTILL 2 SPRAYS INTO BOTH NOSTRILS EVERY DAY Smitty Cords, DO Taking Active            Med Note Zachery Dauer, Dorann Ou   Fri Jul 30, 2023  2:48 PM) Takes PRN  gabapentin (NEURONTIN) 600 MG tablet [id number] Yes Take 600 mg by mouth in the morning, at noon, in the evening, and at bedtime. [provider] Taking Active Self           Med Note Alphonzo [place]   Tue Apr 07, 2022  5:10 PM)    hydrocortisone 2.5 % cream [id number] Yes APPLY TOPICALLY TO THE AFFECTED AREA OF FACE AND EARS 3 DAYS A WEEK ON MONDAYS, WEDNESDAYS, FRIDAYS Deirdre Evener, MD Taking Active   insulin degludec (TRESIBA FLEXTOUCH) 100 UNIT/ML FlexTouch Pen [id number] Yes Inject 68 Units into the skin at bedtime.  Patient taking differently: Inject 50 Units into the skin every evening.   Smitty Cords, DO Taking Active Self           Med Note (MILES, CHRISTINA E   Tue Jan 05, 2023 10:09 AM) 30 units  Insulin Pen Needle 31G X 5 MM MISC [id number]  Use with insulin to inject into skin 3 times daily as directed Smitty Cords, DO  Active Self  ketoconazole (NIZORAL) 2 % cream [id number] Yes APPLY EXTERNALLY TO THE AFFECTED AREA OF FACE AND EARS 3 days a week on [place], THURSDAY, AND SATURDAY Deirdre Evener, MD Taking Active   losartan (COZAAR) 50 MG tablet [id number] Yes Take 1  tablet (50 mg total) by mouth daily. Smitty Cords, DO Taking Active   metFORMIN (GLUCOPHAGE-XR) 500 MG 24 hr tablet [id number] Yes Take 2 tablets (1,000 mg total) by mouth 2 (two) times daily with a meal. Add refills for future Smitty Cords, DO Taking Active   Multiple Vitamin (MULTIVITAMIN) capsule [id number] Yes Take 1 capsule by mouth daily. [provider] Taking Active Self  NOVOLOG FLEXPEN 100 UNIT/ML FlexPen [id number] Yes Inject 14 Units into the skin 2 (two) times daily. Sliding scale Smitty Cords, DO Taking Active Self  Omega-3 Fatty Acids (FISH OIL) 1200 MG CAPS [id number] Yes Take 1,200 mg by mouth in the morning and at bedtime. [provider] Taking Active Self  omeprazole (PRILOSEC) 20 MG capsule [id number] Yes TAKE 1 CAPSULE BY MOUTH DAILY BEFORE BREAKFAST Karamalegos, Netta Neat, DO Taking Active   OZEMPIC, 1 MG/DOSE, 4 MG/3ML SOPN [id number] Yes Inject 1 mg into the  skin once a week. Smitty Cords, DO Taking Active Self  pioglitazone (ACTOS) 15 MG tablet [id number] Yes Take 1 tablet (15 mg total) by mouth daily. OFFICE VISIT NEEDED FOR ADDITIONAL REFILLS, CALL OFFICE TO SCHEDULE Karamalegos, Netta Neat, DO Taking Active   pregabalin (LYRICA) 50 MG capsule [id number] Yes Take 50 mg by mouth in the morning, at noon, in the evening, and at bedtime. [provider] Taking Active   tamsulosin (FLOMAX) 0.4 MG CAPS capsule [id number] Yes TAKE 1 CAPSULE BY MOUTH EVERY NIGHT AT BEDTIME FOR URINE RETENTION Smitty Cords, DO Taking Active   traMADol (ULTRAM) 50 MG tablet [id number] Yes Take 50 mg by mouth 3 (three) times daily as needed. [provider] Taking Active            Med Note Ronney Asters, [facility] A   Fri Oct 15, 2023 10:58 AM)    traZODone (DESYREL) 100 MG tablet [id number] No Take 1.5 tablets (150 mg total) by mouth at bedtime.  Patient not taking: Reported on 10/15/2023   Smitty Cords, DO  Not Taking Active   venlafaxine XR (EFFEXOR-XR) 150 MG 24 hr capsule [id number] Yes TAKE 1 CAPSULE(150 MG) BY MOUTH DAILY WITH BREAKFAST Karamalegos, Netta Neat, DO Taking Active               Assessment/Plan:   Denies dizziness or sedation with taking gabapentin, pregabalin and tramadol as prescribed by Neurologist. Advised patient to monitor and follow up with Neurologist if needed for any future concerns about dizziness or sedation   Diabetes: - Currently controlled - Review with patient signs of and how to manage hypoglycemia. Encourage patient to consider carrying glucose tablets with him - Recommend to check glucose and keep log of results, have this log to review at upcoming appointments, but to contact office sooner if needed for readings outside of established parameters or for symptoms - As patient reports meets financial criteria for Ozempic, Novolog, pen needles [place] patient assistance program through [employer], will collaborate with provider, CPhT, and patient to pursue assistance re-enrollment for 2025. - Will also ask CPhT to aid patient with submitting physical application for Farxiga/along with proof of income for patient assistance re-enrollment from [employer]  Follow Up Plan:  Clinical Pharmacist will follow up with patient by telephone on 12/24/2023 at 10:00 AM     Estelle Grumbles, PharmD, P[place]y [place], CPP Clinical Pharmacist [facility] [phone]

## 2023-10-15 NOTE — Patient Instructions (Signed)
Goals Addressed             This Visit's Progress    Pharmacy Goals       Please watch the mail for an envelope from [facility] containing the patient assistance program application. Please complete this application and mail back to [facility] Pharmacy Technician Noreene Larsson [place] along with a copy of your Medicare Part D prescription card and a copy of your proof of income document OR you can bring these documents to the office to have them faxed back to Attention: Pattricia Boss at Fax # [phone]   If you need to call Noreene Larsson, you can reach her at [phone]  If you need to reach out to patient assistance programs regarding refills or to find out the status of your application, you can do so by calling:  [employer] at [phone] [employer] at [phone]  Our goal A1c is less than 7%. This corresponds with fasting sugars less than 130 and 2 hour after meal sugars less than 180. Please check your blood sugar and keep a log of the results  Please check your home blood pressure, keep a log of the results and bring this with you to your medical appointments.  Our goal bad cholesterol, or LDL, is less than 70 . This is why it is important to continue taking your atorvastatin.   Estelle Grumbles, PharmD, [facility] Clinical Pharmacist [facility] [phone]

## 2023-10-18 ENCOUNTER — Other Ambulatory Visit: Payer: Self-pay | Admitting: Family Medicine

## 2023-10-18 DIAGNOSIS — I10 Essential (primary) hypertension: Secondary | ICD-10-CM

## 2023-10-18 DIAGNOSIS — E1169 Type 2 diabetes mellitus with other specified complication: Secondary | ICD-10-CM

## 2023-10-18 DIAGNOSIS — J3089 Other allergic rhinitis: Secondary | ICD-10-CM

## 2023-10-20 NOTE — Telephone Encounter (Signed)
Requested Prescriptions  Pending Prescriptions Disp Refills   atorvastatin (LIPITOR) 20 MG tablet [Pharmacy Med Name: ATORVASTATIN 20MG  TABLETS] 90 tablet 3    Sig: TAKE 1 TABLET(20 MG) BY MOUTH AT BEDTIME     Cardiovascular:  Antilipid - Statins Failed - 10/18/2023  7:33 PM      Failed - Lipid Panel in normal range within the last 12 months    Cholesterol  Date Value Ref Range Status  07/01/2021 122 <200 mg/dL Final   LDL Cholesterol (Calc)  Date Value Ref Range Status  07/01/2021 62 mg/dL (calc) Final    Comment:    Reference range: <100 . Desirable range <100 mg/dL for primary prevention;   <70 mg/dL for patients with CHD or diabetic patients  with > or = 2 CHD risk factors. Marland Kitchen LDL-C is now calculated using the Martin-Hopkins  calculation, which is a validated novel method providing  better accuracy than the Friedewald equation in the  estimation of LDL-C.  Horald Pollen et al. Lenox Ahr. 1610;960(45): 2061-2068  (http://education.QuestDiagnostics.com/faq/FAQ164)    HDL  Date Value Ref Range Status  07/01/2021 33 (L) > OR = 40 mg/dL Final   Triglycerides  Date Value Ref Range Status  07/01/2021 209 (H) <150 mg/dL Final    Comment:    . If a non-fasting specimen was collected, consider repeat triglyceride testing on a fasting specimen if clinically indicated.  Perry Mount et al. J. of Clin. Lipidol. 2015;9:129-169. Marland Kitchen          Passed - Patient is not pregnant      Passed - Valid encounter within last 12 months    Recent Outpatient Visits           5 days ago Type 2 diabetes mellitus with other specified complication, with long-term current use of insulin (HCC)   [place] [facility], Gentry Fitz A, RPH-CPP   2 weeks ago Type 2 diabetes mellitus with other specified complication, with long-term current use of insulin [facility])   [place] [facility] [place], Netta Neat, DO   2 months ago Type 2 diabetes mellitus with other  specified complication, with long-term current use of insulin [facility])   [place] [facility] [place], Netta Neat, DO   5 months ago Otalgia of right ear   [place] [facility] [place], Netta Neat, DO   5 months ago Otalgia of right ear   [place] [facility] [place], Salvadore Oxford, NP       Future Appointments             In 2 weeks Lonna Cobb, Verna Czech, MD [facility]rology    In 4 months Althea Charon, Netta Neat, DO [place] [facility]   In 5 months Deirdre Evener, MD [place]             cetirizine (ZYRTEC) 10 MG tablet [Pharmacy Med Name: CETIRIZINE 10MG  TABLETS] 90 tablet 3    Sig: TAKE 1 TABLET(10 MG) BY MOUTH AT BEDTIME     Ear, Nose, and Throat:  Antihistamines 2 Passed - 10/18/2023  7:33 PM      Passed - Cr in normal range and within 360 days    Creat  Date Value Ref Range Status  07/01/2021 1.12 0.70 - 1.35 mg/dL Final   Creatinine, Ser  Date Value Ref Range Status  05/19/2023 1.00 0.76 - 1.27 mg/dL Final  Passed - Valid encounter within last 12 months    Recent Outpatient Visits           5 days ago Type 2 diabetes mellitus with other specified complication, with long-term current use of insulin (HCC)   [place] [facility] Delles, Gentry Fitz A, RPH-CPP   2 weeks ago Type 2 diabetes mellitus with other specified complication, with long-term current use of insulin [facility])   [place] [facility] [place], Netta Neat, DO   2 months ago Type 2 diabetes mellitus with other specified complication, with long-term current use of insulin [facility])   [facility] [place], Netta Neat, DO   5 months ago Otalgia of right ear   [place] [facility] Smitty Cords, DO   5 months ago Otalgia of right ear   [place] [facility] [place],  Salvadore Oxford, NP       Future Appointments             In 2 weeks Lonna Cobb, Verna Czech, MD [facility] [place]   In 4 months Althea Charon, Netta Neat, DO [place] [facility], [facility]   In 5 months Deirdre Evener, MD [place] [facility]             losartan (COZAAR) 50 MG tablet [Pharmacy Med Name: LOSARTAN 50MG  TABLETS] 90 tablet 0    Sig: TAKE 1 TABLET(50 MG) BY MOUTH DAILY     Cardiovascular:  Angiotensin Receptor Blockers Passed - 10/18/2023  7:33 PM      Passed - Cr in normal range and within 180 days    Creat  Date Value Ref Range Status  07/01/2021 1.12 0.70 - 1.35 mg/dL Final   Creatinine, Ser  Date Value Ref Range Status  05/19/2023 1.00 0.76 - 1.27 mg/dL Final         Passed - K in normal range and within 180 days    Potassium  Date Value Ref Range Status  05/19/2023 4.3 3.5 - 5.2 mmol/L Final         Passed - Patient is not pregnant      Passed - Last BP in normal range    BP Readings from Last 1 Encounters:  10/04/23 122/78         Passed - Valid encounter within last 6 months    Recent Outpatient Visits           5 days ago Type 2 diabetes mellitus with other specified complication, with long-term current use of insulin (HCC)   [place] [facility] [place], Gentry Fitz A, RPH-CPP   2 weeks ago Type 2 diabetes mellitus with other specified complication, with long-term current use of insulin [facility])   [place] [facility] [place], Netta Neat, DO   2 months ago Type 2 diabetes mellitus with other specified complication, with long-term current use of insulin [facility])   [place] [facility] [place], Netta Neat, DO   5 months ago Otalgia of right ear   [place] [facility] [place], Netta Neat, DO   5 months ago Otalgia of right ear   [place] [facility] [place], Salvadore Oxford, NP       Future Appointments              In 2 weeks Stoioff, Verna Czech, MD [facility] [place]  In 4 months Karamalegos, Netta Neat, DO [facility], [place]   In 5 months Deirdre Evener, MD [facility] [place] [facility]

## 2023-10-21 ENCOUNTER — Other Ambulatory Visit: Payer: Self-pay | Admitting: Family Medicine

## 2023-10-21 DIAGNOSIS — Z794 Long term (current) use of insulin: Secondary | ICD-10-CM

## 2023-10-21 NOTE — Telephone Encounter (Signed)
Requested Prescriptions  Pending Prescriptions Disp Refills   metFORMIN (GLUCOPHAGE-XR) 500 MG 24 hr tablet [Pharmacy Med Name: METFORMIN ER 500MG  24HR TABS] 360 tablet 3    Sig: TAKE 2 TABLETS BY MOUTH TWICE DAILY WITH A MEAL     Endocrinology:  Diabetes - Biguanides Failed - 10/21/2023  6:21 AM      Failed - B12 Level in normal range and within 720 days    No results found for: "VITAMINB12"       Passed - Cr in normal range and within 360 days    Creat  Date Value Ref Range Status  07/01/2021 1.12 0.70 - 1.35 mg/dL Final   Creatinine, Ser  Date Value Ref Range Status  05/19/2023 1.00 0.76 - 1.27 mg/dL Final         Passed - HBA1C is between 0 and 7.9 and within 180 days    Hemoglobin A1C  Date Value Ref Range Status  08/17/2023 6.9 (A) 4.0 - 5.6 % Final   Hgb A1c MFr Bld  Date Value Ref Range Status  12/22/2022 6.8 (H) 4.8 - 5.6 % Final    Comment:    (NOTE) Pre diabetes:          5.7%-6.4%  Diabetes:              >6.4%  Glycemic control for   <7.0% adults with diabetes          Passed - eGFR in normal range and within 360 days    GFR, Estimated  Date Value Ref Range Status  12/22/2022 >60 >60 mL/min Final    Comment:    (NOTE) Calculated using the CKD-EPI Creatinine Equation (2021)    eGFR  Date Value Ref Range Status  05/19/2023 80 >59 mL/min/1.73 Final         Passed - Valid encounter within last 6 months    Recent Outpatient Visits           6 days ago Type 2 diabetes mellitus with other specified complication, with long-term current use of insulin (HCC)   [place] [facility] Delles, Gentry Fitz A, RPH-CPP   2 weeks ago Type 2 diabetes mellitus with other specified complication, with long-term current use of insulin [facility])   [place] [facility] [place], Netta Neat, DO   2 months ago Type 2 diabetes mellitus with other specified complication, with long-term current use of insulin [facility])   [place] [facility] [place], Netta Neat, DO   5 months ago Otalgia of right ear   [place] [facility] Smitty Cords, DO   5 months ago Otalgia of right ear   [facility] [place], Salvadore Oxford, NP       Future Appointments             In 1 week Lonna Cobb, Verna Czech, MD [facility] [place]   In 4 months Althea Charon, Netta Neat, DO [facility], [facility]   In 5 months Deirdre Evener, MD [facility]            Passed - CBC within normal limits and completed in the last 12 months    WBC  Date Value Ref Range Status  05/19/2023 5.9 3.4 - 10.8 x10E3/uL Final  12/22/2022 6.4 4.0 - 10.5 K/uL Final   RBC  Date Value Ref Range Status  05/19/2023 5.57 4.14 -  5.80 x10E6/uL Final  12/22/2022 5.66 4.22 - 5.81 MIL/uL Final   Hemoglobin  Date Value Ref Range Status  05/19/2023 15.0 13.0 - 17.7 g/dL Final   Hematocrit  Date Value Ref Range Status  05/19/2023 46.9 37.5 - 51.0 % Final   MCHC  Date Value Ref Range Status  05/19/2023 32.0 31.5 - 35.7 g/dL Final  40/98/1191 47.8 30.0 - 36.0 g/dL Final   [facility]  Date Value Ref Range Status  05/19/2023 26.9 26.6 - 33.0 pg Final  12/22/2022 27.4 26.0 - 34.0 pg Final   MCV  Date Value Ref Range Status  05/19/2023 84 79 - 97 fL Final   No results found for: "PLTCOUNTKUC", "LABPLAT", "POCPLA" RDW  Date Value Ref Range Status  05/19/2023 13.6 11.6 - 15.4 % Final

## 2023-10-22 ENCOUNTER — Other Ambulatory Visit: Payer: Self-pay | Admitting: Family Medicine

## 2023-10-22 DIAGNOSIS — E1169 Type 2 diabetes mellitus with other specified complication: Secondary | ICD-10-CM

## 2023-10-22 NOTE — Telephone Encounter (Signed)
Request is too soon, last refill 10/13/23 for 90 and 3 refills.  Requested Prescriptions  Pending Prescriptions Disp Refills   atorvastatin (LIPITOR) 20 MG tablet [Pharmacy Med Name: ATORVASTATIN 20MG  TABLETS] 90 tablet 3    Sig: TAKE 1 TABLET(20 MG) BY MOUTH AT BEDTIME     Cardiovascular:  Antilipid - Statins Failed - 10/22/2023  8:27 AM      Failed - Lipid Panel in normal range within the last 12 months    Cholesterol  Date Value Ref Range Status  07/01/2021 122 <200 mg/dL Final   LDL Cholesterol (Calc)  Date Value Ref Range Status  07/01/2021 62 mg/dL (calc) Final    Comment:    Reference range: <100 . Desirable range <100 mg/dL for primary prevention;   <70 mg/dL for patients with CHD or diabetic patients  with > or = 2 CHD risk factors. Marland Kitchen LDL-C is now calculated using the Martin-Hopkins  calculation, which is a validated novel method providing  better accuracy than the Friedewald equation in the  estimation of LDL-C.  Horald Pollen et al. Lenox Ahr. 8756;433(29): 2061-2068  (http://education.Quest[employer]ostics.com/faq/FAQ164)    HDL  Date Value Ref Range Status  07/01/2021 33 (L) > OR = 40 mg/dL Final   Triglycerides  Date Value Ref Range Status  07/01/2021 209 (H) <150 mg/dL Final    Comment:    . If a non-fasting specimen was collected, consider repeat triglyceride testing on a fasting specimen if clinically indicated.  Perry Mount et al. J. of Clin. Lipidol. 2015;9:129-169. Marland Kitchen          Passed - Patient is not pregnant      Passed - Valid encounter within last 12 months    Recent Outpatient Visits           1 week ago Type 2 diabetes mellitus with other specified complication, with long-term current use of insulin (HCC)   [place] [facility] Delles, Gentry Fitz A, RPH-CPP   2 weeks ago Type 2 diabetes mellitus with other specified complication, with long-term current use of insulin [facility])   [place] [facility] [place],  Netta Neat, DO   2 months ago Type 2 diabetes mellitus with other specified complication, with long-term current use of insulin [facility])   [place] [facility] [place], Netta Neat, DO   5 months ago Otalgia of right ear   [place] [facility] [place], Netta Neat, DO   5 months ago Otalgia of right ear   [place] [facility] [place], Salvadore Oxford, NP       Future Appointments             In 1 week Stoioff, Verna Czech, MD [facility] [place]   In 4 months Althea Charon, Netta Neat, DO [facility], [facility]   In 5 months Deirdre Evener, MD [facility] [place] [facility]

## 2023-10-25 ENCOUNTER — Other Ambulatory Visit: Payer: Self-pay | Admitting: Pharmacy Technician

## 2023-10-25 ENCOUNTER — Other Ambulatory Visit: Payer: Self-pay | Admitting: Family Medicine

## 2023-10-25 DIAGNOSIS — Z5986 Financial insecurity: Secondary | ICD-10-CM

## 2023-10-25 DIAGNOSIS — J3089 Other allergic rhinitis: Secondary | ICD-10-CM

## 2023-10-25 DIAGNOSIS — N401 Enlarged prostate with lower urinary tract symptoms: Secondary | ICD-10-CM

## 2023-10-25 DIAGNOSIS — E1169 Type 2 diabetes mellitus with other specified complication: Secondary | ICD-10-CM

## 2023-10-25 DIAGNOSIS — E785 Hyperlipidemia, unspecified: Secondary | ICD-10-CM

## 2023-10-25 NOTE — Progress Notes (Signed)
Pharmacy Medication Assistance Program Note    10/25/2023  Patient ID: Eric Andrews, male   DOB: February 26, 1951, 72 y.o.   MRN: [id number]     10/25/2023  Outreach Medication One  Initial Outreach Date (Medication One) 10/25/2023  Manufacturer Medication One [employer] [facility] Tresiba  Dose of Novolog 100 units/ml  Dose of Ozempic 4mg /96ml  Dose of Tresiba 100 units/ml  Type of Assistance Manufacturer Assistance  Date Application Sent to Patient 10/27/2023  Application Items Requested Application;Proof of Income;Other  Date Application Sent to Prescriber 10/27/2023  Name of Prescriber [place]    10/25/2023  Outreach Medication Two  Initial Outreach Date (Medication Two) 10/25/2023  Manufacturer Medication Two [employer]  [employer] Drugs Farxiga  Dose of Farxiga 10mg   Type of Radiographer, therapeutic Assistance  Date Application Sent to Patient 10/27/2023  Application Items Requested Application;Proof of Income;Other  Date Application Sent to Prescriber 10/27/2023  Name of Prescriber [facility]       Signature  Pattricia Boss, CPhT [place]  Office: [phone] Fax: [phone] Email: [email or web address]

## 2023-10-26 NOTE — Telephone Encounter (Signed)
Requested medication (s) are due for refill today: yes  Requested medication (s) are on the active medication list: yes  Last refill:  08/11/22 #90/3  Future visit scheduled: yes  Notes to clinic:  Unable to refill per protocol due to failed labs, no updated results.      Requested Prescriptions  Pending Prescriptions Disp Refills   finasteride (PROSCAR) 5 MG tablet [Pharmacy Med Name: FINASTERIDE 5MG  TABLETS] 90 tablet 3    Sig: TAKE 1 TABLET(5 MG) BY MOUTH EVERY EVENING     Urology: 5-alpha Reductase Inhibitors Failed - 10/25/2023  1:03 PM      Failed - PSA in normal range and within 360 days    PSA  Date Value Ref Range Status  07/01/2021 0.10 < OR = 4.00 ng/mL Final    Comment:    The total PSA value from this assay system is  standardized against the WHO standard. The test  result will be approximately 20% lower when compared  to the equimolar-standardized total PSA (Beckman  Coulter). Comparison of serial PSA results should be  interpreted with this fact in mind. . This test was performed using the Siemens  chemiluminescent method. Values obtained from  different assay methods cannot be used interchangeably. PSA levels, regardless of value, should not be interpreted as absolute evidence of the presence or absence of disease.          Passed - Valid encounter within last 12 months    Recent Outpatient Visits           1 week ago Type 2 diabetes mellitus with other specified complication, with long-term current use of insulin (HCC)   [place] [facility], Gentry Fitz A, RPH-CPP   3 weeks ago Type 2 diabetes mellitus with other specified complication, with long-term current use of insulin [facility])   [place] [facility] [place], Netta Neat, DO   2 months ago Type 2 diabetes mellitus with other specified complication, with long-term current use of insulin [facility])   [place] [facility] [place],  Netta Neat, DO   5 months ago Otalgia of right ear   [place] [facility] [place], Netta Neat, DO   5 months ago Otalgia of right ear   [place] [facility] [place], Salvadore Oxford, NP       Future Appointments             In 1 week Lonna Cobb, Verna Czech, MD [facility] [place]   In 3 months Althea Charon, Netta Neat, DO [place] [facility], [facility]   In 5 months Deirdre Evener, MD [facility] [place] [facility]            Signed Prescriptions Disp Refills   cetirizine (ZYRTEC) 10 MG tablet 90 tablet 3    Sig: TAKE 1 TABLET(10 MG) BY MOUTH AT BEDTIME     Ear, Nose, and Throat:  Antihistamines 2 Passed - 10/25/2023  1:03 PM      Passed - Cr in normal range and within 360 days    Creat  Date Value Ref Range Status  07/01/2021 1.12 0.70 - 1.35 mg/dL Final   Creatinine, Ser  Date Value Ref Range Status  05/19/2023 1.00 0.76 - 1.27 mg/dL Final         Passed - Valid encounter within last 12 months    Recent Outpatient Visits  1 week ago Type 2 diabetes mellitus with other specified complication, with long-term current use of insulin (HCC)   [place] [facility] Delles, Gentry Fitz A, RPH-CPP   3 weeks ago Type 2 diabetes mellitus with other specified complication, with long-term current use of insulin [facility])   [place] [facility] [place], Netta Neat, DO   2 months ago Type 2 diabetes mellitus with other specified complication, with long-term current use of insulin [facility])   [place] [place], Netta Neat, DO   5 months ago Otalgia of right ear   [place] [facility] [place], Netta Neat, DO   5 months ago Otalgia of right ear   [place] [facility] [place], Salvadore Oxford, NP       Future Appointments             In 1 week Lonna Cobb, Verna Czech, MD [facility] [place]   In 3 months  Althea Charon, Netta Neat, DO [place], [facility]   In 5 months Deirdre Evener, MD [place] [facility]            Refused Prescriptions Disp Refills   atorvastatin (LIPITOR) 20 MG tablet [Pharmacy Med Name: ATORVASTATIN 20MG  TABLETS] 90 tablet 3    Sig: TAKE 1 TABLET(20 MG) BY MOUTH AT BEDTIME     Cardiovascular:  Antilipid - Statins Failed - 10/25/2023  1:03 PM      Failed - Lipid Panel in normal range within the last 12 months    Cholesterol  Date Value Ref Range Status  07/01/2021 122 <200 mg/dL Final   LDL Cholesterol (Calc)  Date Value Ref Range Status  07/01/2021 62 mg/dL (calc) Final    Comment:    Reference range: <100 . Desirable range <100 mg/dL for primary prevention;   <70 mg/dL for patients with CHD or diabetic patients  with > or = 2 CHD risk factors. Marland Kitchen LDL-C is now calculated using the Martin-Hopkins  calculation, which is a validated novel method providing  better accuracy than the Friedewald equation in the  estimation of LDL-C.  Horald Pollen et al. Lenox Ahr. 2952;841(32): 2061-2068  (http://education.Quest[facility].com/faq/FAQ164)    HDL  Date Value Ref Range Status  07/01/2021 33 (L) > OR = 40 mg/dL Final   Triglycerides  Date Value Ref Range Status  07/01/2021 209 (H) <150 mg/dL Final    Comment:    . If a non-fasting specimen was collected, consider repeat triglyceride testing on a fasting specimen if clinically indicated.  Perry Mount et al. J. of Clin. Lipidol. 2015;9:129-169. Marland Kitchen          Passed - Patient is not pregnant      Passed - Valid encounter within last 12 months    Recent Outpatient Visits           1 week ago Type 2 diabetes mellitus with other specified complication, with long-term current use of insulin (HCC)   [place] [facility] Delles, Gentry Fitz A, RPH-CPP   3 weeks ago Type 2 diabetes mellitus with other specified complication, with long-term current use of insulin  [facility])   [place] [facility] [place], Netta Neat, DO   2 months ago Type 2 diabetes mellitus with other specified complication, with long-term current use of insulin [facility])   [place] [facility] [place], Netta Neat, DO   5 months ago Otalgia  of right ear   [place] [facility] [place], Netta Neat, DO   5 months ago Otalgia of right ear   [place] [facility] [employer], Salvadore Oxford, NP       Future Appointments             In 1 week Stoioff, Verna Czech, MD [facility]   In 3 months Althea Charon, Netta Neat, DO [place] [facility], [place]   In 5 months Deirdre Evener, MD [facility] [place] [facility]

## 2023-10-26 NOTE — Telephone Encounter (Signed)
Requested Prescriptions  Pending Prescriptions Disp Refills   cetirizine (ZYRTEC) 10 MG tablet [Pharmacy Med Name: CETIRIZINE 10MG  TABLETS] 90 tablet 3    Sig: TAKE 1 TABLET(10 MG) BY MOUTH AT BEDTIME     Ear, Nose, and Throat:  Antihistamines 2 Passed - 10/25/2023  1:03 PM      Passed - Cr in normal range and within 360 days    Creat  Date Value Ref Range Status  07/01/2021 1.12 0.70 - 1.35 mg/dL Final   Creatinine, Ser  Date Value Ref Range Status  05/19/2023 1.00 0.76 - 1.27 mg/dL Final         Passed - Valid encounter within last 12 months    Recent Outpatient Visits           1 week ago Type 2 diabetes mellitus with other specified complication, with long-term current use of insulin (HCC)   [place] [facility] Delles, Gentry Fitz A, RPH-CPP   3 weeks ago Type 2 diabetes mellitus with other specified complication, with long-term current use of insulin (HCC)   [place] [facility] [place], Netta Neat, DO   2 months ago Type 2 diabetes mellitus with other specified complication, with long-term current use of insulin [facility])   [place] [facility] [place], Netta Neat, DO   5 months ago Otalgia of right ear   [place] [facility] [place], Netta Neat, DO   5 months ago Otalgia of right ear   [place] [facility] [place], Salvadore Oxford, NP       Future Appointments             In 1 week Lonna Cobb, Verna Czech, MD [facility] [place]   In 3 months Althea Charon, Netta Neat, DO [place] [facility], [facility]   In 5 months Deirdre Evener, MD [place] [facility]             finasteride (PROSCAR) 5 MG tablet [Pharmacy Med Name: FINASTERIDE 5MG  TABLETS] 90 tablet 3    Sig: TAKE 1 TABLET(5 MG) BY MOUTH EVERY EVENING     Urology: 5-alpha Reductase Inhibitors Failed - 10/25/2023  1:03 PM      Failed - PSA in normal range and within 360 days     PSA  Date Value Ref Range Status  07/01/2021 0.10 < OR = 4.00 ng/mL Final    Comment:    The total PSA value from this assay system is  standardized against the WHO standard. The test  result will be approximately 20% lower when compared  to the equimolar-standardized total PSA (Beckman  Coulter). Comparison of serial PSA results should be  interpreted with this fact in mind. . This test was performed using the Siemens  chemiluminescent method. Values obtained from  different assay methods cannot be used interchangeably. PSA levels, regardless of value, should not be interpreted as absolute evidence of the presence or absence of disease.          Passed - Valid encounter within last 12 months    Recent Outpatient Visits           1 week ago Type 2 diabetes mellitus with other specified complication, with long-term current use of insulin [facility])    [facility] Delles, Gentry Fitz A, RPH-CPP   3 weeks ago Type 2 diabetes mellitus with other specified complication, with long-term current use of insulin (HCC)  [place] [facility] [place], Netta Neat, DO   2 months ago Type 2 diabetes mellitus with other specified complication, with long-term current use of insulin [facility])   [place] [facility] [place], Netta Neat, DO   5 months ago Otalgia of right ear   [place] [facility] [place], Netta Neat, DO   5 months ago Otalgia of right ear   [place] [facility] [place], Salvadore Oxford, NP       Future Appointments             In 1 week Lonna Cobb, Verna Czech, MD [facility] [place]   In 3 months Althea Charon, Netta Neat, DO [place] [facility], [facility]   In 5 months Deirdre Evener, MD [place] [facility]            Refused Prescriptions Disp Refills   atorvastatin (LIPITOR) 20 MG tablet [Pharmacy Med Name: ATORVASTATIN 20MG  TABLETS]  90 tablet 3    Sig: TAKE 1 TABLET(20 MG) BY MOUTH AT BEDTIME     Cardiovascular:  Antilipid - Statins Failed - 10/25/2023  1:03 PM      Failed - Lipid Panel in normal range within the last 12 months    Cholesterol  Date Value Ref Range Status  07/01/2021 122 <200 mg/dL Final   LDL Cholesterol (Calc)  Date Value Ref Range Status  07/01/2021 62 mg/dL (calc) Final    Comment:    Reference range: <100 . Desirable range <100 mg/dL for primary prevention;   <70 mg/dL for patients with CHD or diabetic patients  with > or = 2 CHD risk factors. Marland Kitchen LDL-C is now calculated using the Martin-Hopkins  calculation, which is a validated novel method providing  better accuracy than the Friedewald equation in the  estimation of LDL-C.  Horald Pollen et al. Lenox Ahr. 7829;562(13): 2061-2068  (http://education.QuestDiagnostics.com/faq/FAQ164)    HDL  Date Value Ref Range Status  07/01/2021 33 (L) > OR = 40 mg/dL Final   Triglycerides  Date Value Ref Range Status  07/01/2021 209 (H) <150 mg/dL Final    Comment:    . If a non-fasting specimen was collected, consider repeat triglyceride testing on a fasting specimen if clinically indicated.  Perry Mount et al. J. of Clin. Lipidol. 2015;9:129-169. Marland Kitchen          Passed - Patient is not pregnant      Passed - Valid encounter within last 12 months    Recent Outpatient Visits           1 week ago Type 2 diabetes mellitus with other specified complication, with long-term current use of insulin (HCC)   [place] [facility] Delles, Gentry Fitz A, RPH-CPP   3 weeks ago Type 2 diabetes mellitus with other specified complication, with long-term current use of insulin [facility])   [place] [facility] [place], Netta Neat, DO   2 months ago Type 2 diabetes mellitus with other specified complication, with long-term current use of insulin [facility])   [place] [facility] [place], Netta Neat, DO   5 months  ago Otalgia of right ear   [place], Netta Neat, DO   5 months ago Otalgia of right ear    [facility] [place], Salvadore Oxford, NP       Future Appointments  In 1 week Stoioff, Verna Czech, MD [facility] [place]   In 3 months Althea Charon, Netta Neat, DO [facility], [place]   In 5 months Deirdre Evener, MD [facility] [place] [facility]

## 2023-10-29 ENCOUNTER — Encounter: Payer: Self-pay | Admitting: Nurse Practitioner

## 2023-10-29 ENCOUNTER — Ambulatory Visit: Payer: Medicare Other | Admitting: Nurse Practitioner

## 2023-10-29 VITALS — BP 120/84 | HR 83 | Temp 97.1°F | Ht 69.0 in | Wt 303.2 lb

## 2023-10-29 DIAGNOSIS — G4733 Obstructive sleep apnea (adult) (pediatric): Secondary | ICD-10-CM | POA: Diagnosis not present

## 2023-10-29 NOTE — Patient Instructions (Addendum)
Continue to use CPAP every night, minimum of 4-6 hours a night.  Change equipment as directed. Wash your tubing with warm soap and water daily, hang to dry. Wash humidifier portion weekly. Use bottled, distilled water and change daily Be aware of reduced alertness and do not drive or operate heavy machinery if experiencing this or drowsiness.  Exercise encouraged, as tolerated. Healthy weight management discussed.  Avoid or decrease alcohol consumption and medications that make you more sleepy, if possible. Notify if persistent daytime sleepiness occurs even with consistent use of PAP therapy.  Adjust CPAP settings to 5-12 cmH2O Let me know if you feel like pressure setting adjustment doesn't help or you feel like you're not getting enough air  Follow up in 6 months with Dr. Jayme Cloud or Katie Kabria Hetzer,NP. If symptoms do not improve or worsen, please contact office for sooner follow up or seek emergency care.

## 2023-10-29 NOTE — Progress Notes (Signed)
@Patient  ID: Eric Andrews, male    DOB: June 07, 1951, 72 y.o.   MRN: [id number]  Chief Complaint  Patient presents with   Follow-up    CPAP- still adjusting to it. Mask and pressure are ok.     Referring provider: Saralyn Pilar *  HPI: 72 year old male, never smoker followed for OSA on CPAP and SOB. He is a patient of Dr. Jayme Cloud and last seen in office 08/24/2023. Past medical history significant GERD, DM, HLD, GAD, depression, obesity.   TEST/EVENTS:  8/92017 sleep study: AHI 13.5/h, REM AHI 46.5/h, SpO2 low 82% 10/29/2020 PFT: FVC 82%, FEV1 88%. ERV 2% (consistent with obesity). Diffusion capacity normal  08/24/2023: OV with Dr. Jayme Cloud. Lifelong never smoker with a history of OSA and issues with dyspnea.  Last seen October 2023.  Maintained on auto CPAP of 5 to 20 cm water.  Tolerating well.  States that he gets restful sleep and is able to sleep at night with it.  Compliance is 100% by his report.  Average pressure around 9 to 11 cm water.  AHI 0.8.  No issues with cough or sputum production.  No wheezing.  Dyspnea is markedly better.  Overall feels like he is doing well.  Needs to switch from [employer] to [employer] due to insurance.  10/29/2023: Today-follow-up Patient presents today for follow-up.  Since his last visit he had to change medical supply companies so he needed a follow-up for compliance.  Still doing well on CPAP.  He does occasionally feel like the pressures are set a little too high which causes some mask leaks.  He wears a nasal mask which he feels like fits well otherwise.  Sleeps well on his CPAP.  Feels well rested.  Energy levels are okay for the most part during the day.  Denies drowsy driving or sleep parasomnia/paralysis.  Breathing doing well. No cough, congestion, wheezing.   09/28/2023-10/27/2023: CPAP 5-20 cmH2O 30/30 days; 100% >4 hr; average use 9 hours 20 minutes Pressure 95th 12.3 Leaks 95th 30.8 AHI 0.8  Allergies  Allergen Reactions   Dust  Mite Extract     Immunization History  Administered Date(s) Administered   Fluad Quad(high Dose 65+) 10/10/2020, 11/06/2021, 09/28/2022   Fluad Trivalent(High Dose 65+) 10/04/2023   Moderna Sars-Covid-2 Vaccination 02/09/2020, 03/13/2020   PFIZER SARS-COV-2 Pediatric Vaccination 5-71yrs 10/18/2020, 04/30/2021   PNEUMOCOCCAL CONJUGATE-20 11/06/2021   Tdap 01/16/2018    Past Medical History:  Diagnosis Date   ADD (attention deficit disorder)    Anemia    Anxiety    Arthritis    Depression    Diabetes mellitus without complication (HCC)    Enlarged prostate    GERD (gastroesophageal reflux disease)    History of kidney stones    Hypertension    Knee pain    Neuromuscular disorder (HCC)    lower legs- neuropathy   Sleep apnea     Tobacco History: Social History   Tobacco Use  Smoking Status Never  Smokeless Tobacco Never   Counseling given: Not Answered   Outpatient Medications Prior to Visit  Medication Sig Dispense Refill   Ascorbic Acid (VITAMIN C) 1000 MG tablet Take 1,000 mg by mouth daily.     aspirin EC 81 MG tablet Take 81 mg by mouth daily. Swallow whole.     busPIRone (BUSPAR) 15 MG tablet Take 15 mg by mouth daily.     celecoxib (CELEBREX) 200 MG capsule TAKE 1 CAPSULE(200 MG) BY MOUTH TWICE DAILY 180 capsule 1  cetirizine (ZYRTEC) 10 MG tablet TAKE 1 TABLET(10 MG) BY MOUTH AT BEDTIME 90 tablet 3   chlorthalidone (HYGROTON) 25 MG tablet TAKE 1 TABLET(25 MG) BY MOUTH DAILY 90 tablet 3   cholecalciferol (VITAMIN D3) 25 MCG (1000 UNIT) tablet Take 1,000 Units by mouth in the morning and at bedtime.     FARXIGA 10 MG TABS tablet Take 1 tablet (10 mg total) by mouth daily before breakfast. 90 tablet 0   fenofibrate micronized (LOFIBRA) 134 MG capsule TAKE 1 CAPSULE(134 MG) BY MOUTH DAILY BEFORE BREAKFAST 90 capsule 3   finasteride (PROSCAR) 5 MG tablet TAKE 1 TABLET(5 MG) BY MOUTH EVERY EVENING 90 tablet 3   fluticasone (FLONASE) 50 MCG/ACT nasal spray INSTILL  2 SPRAYS INTO BOTH NOSTRILS EVERY DAY 16 g 3   gabapentin (NEURONTIN) 600 MG tablet Take 600 mg by mouth in the morning, at noon, in the evening, and at bedtime.     hydrocortisone 2.5 % cream APPLY TOPICALLY TO THE AFFECTED AREA OF FACE AND EARS 3 DAYS A WEEK ON MONDAYS, WEDNESDAYS, FRIDAYS 30 g 2   insulin degludec (TRESIBA FLEXTOUCH) 100 UNIT/ML FlexTouch Pen Inject 68 Units into the skin at bedtime. (Patient taking differently: Inject 50 Units into the skin every evening.) 21 mL 5   Insulin Pen Needle 31G X 5 MM MISC Use with insulin to inject into skin 3 times daily as directed 400 each 3   ketoconazole (NIZORAL) 2 % cream APPLY EXTERNALLY TO THE AFFECTED AREA OF FACE AND EARS 3 days a week on TUESDAY, THURSDAY, AND SATURDAY 30 g 11   losartan (COZAAR) 50 MG tablet TAKE 1 TABLET(50 MG) BY MOUTH DAILY 90 tablet 0   metFORMIN (GLUCOPHAGE-XR) 500 MG 24 hr tablet TAKE 2 TABLETS BY MOUTH TWICE DAILY WITH A MEAL 360 tablet 1   Multiple Vitamin (MULTIVITAMIN) capsule Take 1 capsule by mouth daily.     NOVOLOG FLEXPEN 100 UNIT/ML FlexPen Inject 14 Units into the skin 2 (two) times daily. Sliding scale 15 mL 11   Omega-3 Fatty Acids (FISH OIL) 1200 MG CAPS Take 1,200 mg by mouth in the morning and at bedtime.     omeprazole (PRILOSEC) 20 MG capsule TAKE 1 CAPSULE BY MOUTH DAILY BEFORE BREAKFAST 90 capsule 1   OZEMPIC, 1 MG/DOSE, 4 MG/3ML SOPN Inject 1 mg into the skin once a week. 12 mL 3   pioglitazone (ACTOS) 15 MG tablet Take 1 tablet (15 mg total) by mouth daily. OFFICE VISIT NEEDED FOR ADDITIONAL REFILLS, CALL OFFICE TO SCHEDULE 90 tablet 0   pregabalin (LYRICA) 50 MG capsule Take 50 mg by mouth in the morning, at noon, in the evening, and at bedtime.     QUEtiapine (SEROQUEL) 25 MG tablet Take 25 mg by mouth at bedtime.     tamsulosin (FLOMAX) 0.4 MG CAPS capsule TAKE 1 CAPSULE BY MOUTH EVERY NIGHT AT BEDTIME FOR URINE RETENTION 90 capsule 3   traMADol (ULTRAM) 50 MG tablet Take 50 mg by mouth 3  (three) times daily as needed.     venlafaxine XR (EFFEXOR-XR) 150 MG 24 hr capsule TAKE 1 CAPSULE(150 MG) BY MOUTH DAILY WITH BREAKFAST 90 capsule 3   atorvastatin (LIPITOR) 20 MG tablet TAKE 1 TABLET(20 MG) BY MOUTH AT BEDTIME (Patient not taking: Reported on 10/29/2023) 90 tablet 3   traZODone (DESYREL) 100 MG tablet Take 1.5 tablets (150 mg total) by mouth at bedtime. (Patient not taking: Reported on 10/29/2023) 90 tablet 3   No facility-administered medications  prior to visit.     Review of Systems:   Constitutional: No weight loss or gain, night sweats, fevers, chills,  or lassitude. +occasional fatigue HEENT: No headaches, difficulty swallowing, tooth/dental problems, or sore throat. No sneezing, itching, ear ache, nasal congestion, or post nasal drip CV:  No chest pain, orthopnea, PND, swelling in lower extremities, anasarca, dizziness, palpitations, syncope Resp: No shortness of breath with exertion or at rest. No excess mucus or change in color of mucus. No productive or non-productive. No hemoptysis. No wheezing.  No chest wall deformity GI:  No heartburn, indigestion GU: No nocturia Skin: No rash, lesions, ulcerations MSK:  No joint pain or swelling.   Neuro: No dizziness or lightheadedness.  Psych: No depression or anxiety. Mood stable.     Physical Exam:  BP 120/84 (BP Location: Right Wrist, Cuff Size: Normal)   Pulse 83   Temp (!) 97.1 F (36.2 C)   Ht 5\' 9"  (1.753 m)   Wt (!) 303 lb 3.2 oz (137.5 kg)   SpO2 95%   BMI 44.77 kg/m   GEN: Pleasant, interactive, well-kempt; morbidly obese; in no acute distress HEENT:  Normocephalic and atraumatic. PERRLA. Sclera white. Nasal turbinates pink, moist and patent bilaterally. No rhinorrhea present. Oropharynx pink and moist, without exudate or edema. No lesions, ulcerations, or postnasal drip. Mallampati III NECK:  Supple w/ fair ROM. No JVD present. Normal carotid impulses w/o bruits. Thyroid symmetrical with no goiter or  nodules palpated. No lymphadenopathy.   CV: RRR, no m/r/g, no peripheral edema. Pulses intact, +2 bilaterally. No cyanosis, pallor or clubbing. PULMONARY:  Unlabored, regular breathing. Clear bilaterally A&P w/o wheezes/rales/rhonchi. No accessory muscle use.  GI: BS present and normoactive. Soft, non-tender to palpation. No organomegaly or masses detected.  MSK: No erythema, warmth or tenderness. Cap refil <2 sec all extrem. No deformities or joint swelling noted.  Neuro: A/Ox3. No focal deficits noted.   Skin: Warm, no lesions or rashe Psych: Normal affect and behavior. Judgement and thought content appropriate.     Lab Results:  CBC    Component Value Date/Time   WBC 5.9 05/19/2023 1045   WBC 6.4 12/22/2022 0855   RBC 5.57 05/19/2023 1045   RBC 5.66 12/22/2022 0855   HGB 15.0 05/19/2023 1045   HCT 46.9 05/19/2023 1045   PLT 168 12/22/2022 0855   MCV 84 05/19/2023 1045   MCH 26.9 05/19/2023 1045   MCH 27.4 12/22/2022 0855   MCHC 32.0 05/19/2023 1045   MCHC 32.6 12/22/2022 0855   RDW 13.6 05/19/2023 1045   LYMPHSABS 2.1 05/19/2023 1045   MONOABS 0.5 12/22/2022 0855   EOSABS 0.3 05/19/2023 1045   BASOSABS 0.1 05/19/2023 1045    BMET    Component Value Date/Time   NA 141 05/19/2023 1045   K 4.3 05/19/2023 1045   CL 103 05/19/2023 1045   CO2 24 05/19/2023 1045   GLUCOSE 163 (H) 05/19/2023 1045   GLUCOSE 119 (H) 12/22/2022 0855   BUN 20 05/19/2023 1045   CREATININE 1.00 05/19/2023 1045   CREATININE 1.12 07/01/2021 0809   CALCIUM 9.5 05/19/2023 1045   GFRNONAA >60 12/22/2022 0855    BNP No results found for: "BNP"   Imaging:  No results found.  Administration History     None          Latest Ref Rng & Units 10/29/2020   12:05 PM  PFT Results  FVC-Pre L 3.85   FVC-Predicted Pre % 82   FVC-Post L  3.85   FVC-Predicted Post % 82   Pre FEV1/FVC % % 79   Post FEV1/FCV % % 84   FEV1-Pre L 3.06   FEV1-Predicted Pre % 88   FEV1-Post L 3.23   DLCO  uncorrected ml/min/mmHg 26.79   DLCO UNC% % 98   DLVA Predicted % 101   TLC L 6.81   TLC % Predicted % 94   RV % Predicted % 88     No results found for: "NITRICOXIDE"      Assessment & Plan:   OSA on CPAP OSA on CPAP.  Excellent compliance and control.  Receives benefit from use.  Does occasionally feel like pressures are too high, which causes some leaks. He does have moderate leakage on download. Average pressure 12 cmH2O. Will adjust him to 5-12 cmH2O. Advised him to notify if he has difficulties with this change. Encouraged to continue to use nightly. Maintain proper care/use of device. Understands risks of untreated OSA. Safe driving practices reviewed.  Patient Instructions  Continue to use CPAP every night, minimum of 4-6 hours a night.  Change equipment as directed. Wash your tubing with warm soap and water daily, hang to dry. Wash humidifier portion weekly. Use bottled, distilled water and change daily Be aware of reduced alertness and do not drive or operate heavy machinery if experiencing this or drowsiness.  Exercise encouraged, as tolerated. Healthy weight management discussed.  Avoid or decrease alcohol consumption and medications that make you more sleepy, if possible. Notify if persistent daytime sleepiness occurs even with consistent use of PAP therapy.  Adjust CPAP settings to 5-12 cmH2O Let me know if you feel like pressure setting adjustment doesn't help or you feel like you're not getting enough air  Follow up in 6 months with Dr. Jayme Cloud or Katie Boleslaw Borghi,NP. If symptoms do not improve or worsen, please contact office for sooner follow up or seek emergency care.    Advised if symptoms do not improve or worsen, to please contact office for sooner follow up or seek emergency care.   I spent 25 minutes of dedicated to the care of this patient on the date of this encounter to include pre-visit review of records, face-to-face time with the patient discussing  conditions above, post visit ordering of testing, clinical documentation with the electronic health record, making appropriate referrals as documented, and communicating necessary findings to members of the patients care team.  Noemi Chapel, NP 10/29/2023  Pt aware and understands NP's role.

## 2023-10-29 NOTE — Assessment & Plan Note (Addendum)
OSA on CPAP.  Excellent compliance and control.  Receives benefit from use.  Does occasionally feel like pressures are too high, which causes some leaks. He does have moderate leakage on download. Average pressure 12 cmH2O. Will adjust him to 5-12 cmH2O. Advised him to notify if he has difficulties with this change. Encouraged to continue to use nightly. Maintain proper care/use of device. Understands risks of untreated OSA. Safe driving practices reviewed.  Patient Instructions  Continue to use CPAP every night, minimum of 4-6 hours a night.  Change equipment as directed. Wash your tubing with warm soap and water daily, hang to dry. Wash humidifier portion weekly. Use bottled, distilled water and change daily Be aware of reduced alertness and do not drive or operate heavy machinery if experiencing this or drowsiness.  Exercise encouraged, as tolerated. Healthy weight management discussed.  Avoid or decrease alcohol consumption and medications that make you more sleepy, if possible. Notify if persistent daytime sleepiness occurs even with consistent use of PAP therapy.  Adjust CPAP settings to 5-12 cmH2O Let me know if you feel like pressure setting adjustment doesn't help or you feel like you're not getting enough air  Follow up in 6 months with Dr. Jayme Cloud or Katie Jayden Rudge,NP. If symptoms do not improve or worsen, please contact office for sooner follow up or seek emergency care.

## 2023-10-29 NOTE — Progress Notes (Signed)
Agree with the details of the visit as noted by Katherine Cobb, NP. ° °C. Laura Yasin Ducat, MD °Advanced Bronchoscopy °P[facility] ° °

## 2023-11-01 ENCOUNTER — Other Ambulatory Visit: Payer: Self-pay | Admitting: *Deleted

## 2023-11-01 DIAGNOSIS — N201 Calculus of ureter: Secondary | ICD-10-CM

## 2023-11-02 DIAGNOSIS — G4733 Obstructive sleep apnea (adult) (pediatric): Secondary | ICD-10-CM | POA: Diagnosis not present

## 2023-11-02 NOTE — Progress Notes (Signed)
Agree with the details of the visit as noted by Katherine Cobb, NP. ° °C. Laura Yasin Ducat, MD °Advanced Bronchoscopy °P[facility] ° °

## 2023-11-03 ENCOUNTER — Ambulatory Visit
Admission: RE | Admit: 2023-11-03 | Discharge: 2023-11-03 | Disposition: A | Discharge status: Home / Self Care | Payer: Medicare Other | Source: Ambulatory Visit | Attending: Urology | Admitting: Urology

## 2023-11-03 ENCOUNTER — Ambulatory Visit
Admission: RE | Admit: 2023-11-03 | Discharge: 2023-11-03 | Disposition: A | Discharge status: Home / Self Care | Payer: Medicare Other | Attending: Urology | Admitting: Urology

## 2023-11-03 ENCOUNTER — Encounter: Payer: Self-pay | Admitting: Urology

## 2023-11-03 ENCOUNTER — Ambulatory Visit: Payer: Medicare Other | Admitting: Urology

## 2023-11-03 VITALS — BP 121/68 | HR 74 | Ht 69.0 in | Wt 303.0 lb

## 2023-11-03 DIAGNOSIS — Z09 Encounter for follow-up examination after completed treatment for conditions other than malignant neoplasm: Secondary | ICD-10-CM

## 2023-11-03 DIAGNOSIS — N201 Calculus of ureter: Secondary | ICD-10-CM | POA: Insufficient documentation

## 2023-11-03 DIAGNOSIS — Z87442 Personal history of urinary calculi: Secondary | ICD-10-CM

## 2023-11-03 DIAGNOSIS — K4090 Unilateral inguinal hernia, without obstruction or gangrene, not specified as recurrent: Secondary | ICD-10-CM | POA: Diagnosis not present

## 2023-11-03 NOTE — Progress Notes (Signed)
I, Maysun Anabel Bene, acting as a scribe for Riki Altes, MD., have documented all relevant documentation on the behalf of Riki Altes, MD, as directed by Riki Altes, MD while in the presence of Riki Altes, MD.  11/03/2023 11:04 AM   Eric Andrews Sep 15, 1951 [id number]  Referring provider: Smitty Cords, DO [address] [place],  [place] [address]  Chief Complaint  Patient presents with   Nephrolithiasis   Urologic history 1. Personal history of urinary calculi Initially seen 02/01/2022 with a 5mm right UVJ calculus which he passed.   HPI: Eric Andrews is a 72 y.o. male presents for annual follow-up.  Since his last visit, he has no complaints and specifically denies flank, abdominal or pelvic pain.  No bothersome lower urinary tract symptoms.   PMH: Past Medical History:  Diagnosis Date   ADD (attention deficit disorder)    Anemia    Anxiety    Arthritis    Depression    Diabetes mellitus without complication (HCC)    Enlarged prostate    GERD (gastroesophageal reflux disease)    History of kidney stones    Hypertension    Knee pain    Neuromuscular disorder (HCC)    lower legs- neuropathy   Sleep apnea     Surgical History: Past Surgical History:  Procedure Laterality Date   APPENDECTOMY     CARPEL TUNEL Bilateral    COLONOSCOPY W/ POLYPECTOMY     COLONOSCOPY WITH PROPOFOL N/A 03/22/2023   Procedure: COLONOSCOPY WITH PROPOFOL;  Surgeon: Wyline Mood, MD;  Location: [facility] ENDOSCOPY;  Service: Gastroenterology;  Laterality: N/A;   ESOPHAGOGASTRODUODENOSCOPY (EGD) WITH PROPOFOL N/A 08/18/2023   Procedure: ESOPHAGOGASTRODUODENOSCOPY (EGD) WITH PROPOFOL;  Surgeon: Wyline Mood, MD;  Location: [facility] ENDOSCOPY;  Service: Gastroenterology;  Laterality: N/A;   HERNIA REPAIR     Left   JOINT REPLACEMENT     KNEE ARTHROSCOPY     Left   RIGHT WRIST SX X2     TOTAL KNEE ARTHROPLASTY Left 04/21/2022   Procedure: TOTAL KNEE ARTHROPLASTY;  Surgeon: Kennedy Bucker, MD;  Location: [facility] ORS;  Service: Orthopedics;  Laterality: Left;   TOTAL SHOULDER ARTHROPLASTY Left 01/05/2023   Procedure: Left total shoulder arthroplasty, biceps tenodesis;  Surgeon: Signa Kell, MD;  Location: [facility] ORS;  Service: Orthopedics;  Laterality: Left;   vascetomy      Home Medications:  Allergies as of 11/03/2023       Reactions   Dust Mite Extract         Medication List        Accurate as of November 03, 2023 11:04 AM. If you have any questions, ask your nurse or doctor.          STOP taking these medications    atorvastatin 20 MG tablet Commonly known as: LIPITOR Stopped by: Riki Altes       TAKE these medications    aspirin EC 81 MG tablet Take 81 mg by mouth daily. Swallow whole.   busPIRone 15 MG tablet Commonly known as: BUSPAR Take 15 mg by mouth daily.   celecoxib 200 MG capsule Commonly known as: CELEBREX TAKE 1 CAPSULE(200 MG) BY MOUTH TWICE DAILY   cetirizine 10 MG tablet Commonly known as: ZYRTEC TAKE 1 TABLET(10 MG) BY MOUTH AT BEDTIME   chlorthalidone 25 MG tablet Commonly known as: HYGROTON TAKE 1 TABLET(25 MG) BY MOUTH DAILY   cholecalciferol 25 MCG (1000 UNIT) tablet Commonly known as: VITAMIN  D3 Take 1,000 Units by mouth in the morning and at bedtime.   Farxiga 10 MG Tabs tablet Generic drug: dapagliflozin propanediol Take 1 tablet (10 mg total) by mouth daily before breakfast.   fenofibrate micronized 134 MG capsule Commonly known as: LOFIBRA TAKE 1 CAPSULE(134 MG) BY MOUTH DAILY BEFORE BREAKFAST   finasteride 5 MG tablet Commonly known as: PROSCAR TAKE 1 TABLET(5 MG) BY MOUTH EVERY EVENING   Fish Oil 1200 MG Caps Take 1,200 mg by mouth in the morning and at bedtime.   fluticasone 50 MCG/ACT nasal spray Commonly known as: FLONASE INSTILL 2 SPRAYS INTO BOTH NOSTRILS EVERY DAY   gabapentin 600 MG tablet Commonly known as: NEURONTIN Take 600 mg by mouth in the morning, at noon, in the  evening, and at bedtime.   hydrocortisone 2.5 % cream APPLY TOPICALLY TO THE AFFECTED AREA OF FACE AND EARS 3 DAYS A WEEK ON MONDAYS, WEDNESDAYS, FRIDAYS   Insulin Pen Needle 31G X 5 MM Misc Use with insulin to inject into skin 3 times daily as directed   ketoconazole 2 % cream Commonly known as: NIZORAL APPLY EXTERNALLY TO THE AFFECTED AREA OF FACE AND EARS 3 days a week on TUESDAY, THURSDAY, AND SATURDAY   losartan 50 MG tablet Commonly known as: COZAAR TAKE 1 TABLET(50 MG) BY MOUTH DAILY   metFORMIN 500 MG 24 hr tablet Commonly known as: GLUCOPHAGE-XR TAKE 2 TABLETS BY MOUTH TWICE DAILY WITH A MEAL   multivitamin capsule Take 1 capsule by mouth daily.   NovoLOG FlexPen 100 UNIT/ML FlexPen Generic drug: insulin aspart Inject 14 Units into the skin 2 (two) times daily. Sliding scale   omeprazole 20 MG capsule Commonly known as: PRILOSEC TAKE 1 CAPSULE BY MOUTH DAILY BEFORE BREAKFAST   Ozempic (1 MG/DOSE) 4 MG/3ML Sopn Generic drug: Semaglutide (1 MG/DOSE) Inject 1 mg into the skin once a week.   pioglitazone 15 MG tablet Commonly known as: ACTOS Take 1 tablet (15 mg total) by mouth daily. OFFICE VISIT NEEDED FOR ADDITIONAL REFILLS, CALL OFFICE TO SCHEDULE   pregabalin 50 MG capsule Commonly known as: LYRICA Take 50 mg by mouth in the morning, at noon, in the evening, and at bedtime.   QUEtiapine 25 MG tablet Commonly known as: SEROQUEL Take 25 mg by mouth at bedtime.   tamsulosin 0.4 MG Caps capsule Commonly known as: FLOMAX TAKE 1 CAPSULE BY MOUTH EVERY NIGHT AT BEDTIME FOR URINE RETENTION   traMADol 50 MG tablet Commonly known as: ULTRAM Take 50 mg by mouth 3 (three) times daily as needed.   traZODone 100 MG tablet Commonly known as: DESYREL Take 1.5 tablets (150 mg total) by mouth at bedtime.   Evaristo Bury FlexTouch 100 UNIT/ML FlexTouch Pen Generic drug: insulin degludec Inject 68 Units into the skin at bedtime. What changed:  how much to take when to  take this   venlafaxine XR 150 MG 24 hr capsule Commonly known as: EFFEXOR-XR TAKE 1 CAPSULE(150 MG) BY MOUTH DAILY WITH BREAKFAST   vitamin C 1000 MG tablet Take 1,000 mg by mouth daily.        Allergies:  Allergies  Allergen Reactions   Dust Mite Extract     Family History: Family History  Problem Relation Age of Onset   Heart attack Mother 81   Lung cancer Father    Alzheimer's disease Brother    Heart attack Maternal Grandfather 54    Social History:  reports that he has never smoked. He has never used smokeless  tobacco. He reports that he does not currently use alcohol. He reports that he does not use drugs.   Physical Exam: BP 121/68   Pulse 74   Ht 5\' 9"  (1.753 m)   Wt (!) 303 lb (137.4 kg)   BMI 44.75 kg/m   Constitutional:  Alert and oriented, No acute distress. HEENT: [place] AT Respiratory: Normal respiratory effort, no increased work of breathing. Psychiatric: Normal mood and affect.   Pertinent Imaging: KUB performed earlier today was personally reviewed and interpreted. No calcifications suspicious for urinary tract stones are identified overlying the renal outlines and expected course of the ureter.   Assessment & Plan:    1. Personal history urinary calculi KUB today negative 1 year follow-up with KUB and if negative on next year's visit we'll see back as needed  Instructed to call earlier for recurrent stone symptoms   I have reviewed the above documentation for accuracy and completeness, and I agree with the above.   Riki Altes, MD  [facility] [address] [place], [place] [address] [phone]

## 2023-11-08 ENCOUNTER — Other Ambulatory Visit: Payer: Self-pay | Admitting: Family Medicine

## 2023-11-08 DIAGNOSIS — E1169 Type 2 diabetes mellitus with other specified complication: Secondary | ICD-10-CM

## 2023-11-10 NOTE — Telephone Encounter (Signed)
Requested by interface surescripts. Future visit in 3 month.  Requested Prescriptions  Pending Prescriptions Disp Refills   pioglitazone (ACTOS) 15 MG tablet [Pharmacy Med Name: PIOGLITAZONE 15MG  TABLETS] 90 tablet 0    Sig: TAKE 1 TABLET BY MOUTH DAILY(OFFICE VISIT NEEDED FOR ADDITIONAL REFILLS-CALL OFFICE TO SCHEDULE)     Endocrinology:  Diabetes - Glitazones - pioglitazone Passed - 11/08/2023  8:50 AM      Passed - HBA1C is between 0 and 7.9 and within 180 days    Hemoglobin A1C  Date Value Ref Range Status  08/17/2023 6.9 (A) 4.0 - 5.6 % Final   Hgb A1c MFr Bld  Date Value Ref Range Status  12/22/2022 6.8 (H) 4.8 - 5.6 % Final    Comment:    (NOTE) Pre diabetes:          5.7%-6.4%  Diabetes:              >6.4%  Glycemic control for   <7.0% adults with diabetes          Passed - Valid encounter within last 6 months    Recent Outpatient Visits           3 weeks ago Type 2 diabetes mellitus with other specified complication, with long-term current use of insulin (HCC)   [place] [facility] Delles, Gentry Fitz A, RPH-CPP   1 month ago Type 2 diabetes mellitus with other specified complication, with long-term current use of insulin [facility])   [place] [facility] [place], Netta Neat, DO   2 months ago Type 2 diabetes mellitus with other specified complication, with long-term current use of insulin [facility])   [facility] [place], Netta Neat, DO   5 months ago Otalgia of right ear   [place] [facility] [place], Netta Neat, DO   5 months ago Otalgia of right ear   [facility] Dobson, Salvadore [place], NP       Future Appointments             In 3 months Althea Charon, Netta Neat, DO  [facility], [facility]   In 4 months Deirdre Evener, MD [facility]   In 11 months Stoioff, Verna Czech, MD [facility] [place]

## 2023-11-17 ENCOUNTER — Telehealth: Payer: Self-pay | Admitting: Pharmacy Technician

## 2023-11-17 DIAGNOSIS — Z5986 Financial insecurity: Secondary | ICD-10-CM

## 2023-11-17 NOTE — Progress Notes (Addendum)
Pharmacy Medication Assistance Program Note    11/17/2023  Patient ID: Eric Andrews, male   DOB: [birth year], 72 y.o.   MRN: [id number]     10/25/2023 11/17/2023  Outreach Medication One  Initial Outreach Date (Medication One) 10/25/2023   Manufacturer Medication One [employer] Drugs Tresiba   Dose of Novolog 100 units/ml   Dose of Ozempic 4mg /12ml   Dose of Tresiba 100 units/ml   Type of Assistance Manufacturer Assistance   Date Application Sent to Patient 10/27/2023   Application Items Requested Application;Proof of Income;Other   Date Application Sent to Prescriber 10/27/2023   Name of Prescriber Saralyn Pilar   Date Application Received From Patient  11/10/2023  Application Items Received From Patient  Application;Proof of Income;Other  Date Application Received From Provider  10/27/2023  Date Application Submitted to Manufacturer  11/17/2023  Method Application Sent to Manufacturer  Fax          10/25/2023 11/17/2023  Outreach Medication Two  Initial Outreach Date (Medication Two) 10/25/2023   Manufacturer Medication Two [employer]   [employer] Drugs Farxiga   Dose of Farxiga 10mg    Type of Radiographer, therapeutic Assistance   Date Application Sent to Patient 10/27/2023   Application Items Requested Application;Proof of Income;Other   Date Application Sent to Prescriber 10/27/2023   Name of Prescriber Saralyn Pilar   Date Application Received From Patient  11/10/2023  Application Items Received From Patient  Application;Proof of Income;Other  Date Application Received From Provider  10/27/2023  Method Application Sent to Manufacturer  Fax  Date Application Submitted to Manufacturer  11/17/2023      ADDENDUM-11/25/2023-   Care coordination call to Novo Nordisk in regard to medications listed above. Spoke to [facility] who iforms on page 2 they are unable to tell if the 2 boxes are checked. She ifnorms to have patient make the marks darker  if he indeed did check the boxes. Informed Shameka the boxes are definitiely checked on my copy so will make them darker and refax. She advises to put the patient id number on the top of the page which is [phone]. Completed as above and refaxed today 11/25/23   2nd care coordination call placed to AZ[employer]ME in regard to [place]. Spoke to [place] who informs she will have the determination team re process patient's application with the tax return that he provided on 11/17/23. She informs MD office will receive a fax notifying of the re determination outcome.  Signature   Kristopher Glee [facility]  Office: [phone] Fax: [phone] Email: [name]

## 2023-11-22 DIAGNOSIS — Z96612 Presence of left artificial shoulder joint: Secondary | ICD-10-CM | POA: Diagnosis not present

## 2023-11-26 DIAGNOSIS — G8929 Other chronic pain: Secondary | ICD-10-CM | POA: Diagnosis not present

## 2023-11-26 DIAGNOSIS — M25512 Pain in left shoulder: Secondary | ICD-10-CM | POA: Diagnosis not present

## 2023-12-02 DIAGNOSIS — G4733 Obstructive sleep apnea (adult) (pediatric): Secondary | ICD-10-CM | POA: Diagnosis not present

## 2023-12-03 ENCOUNTER — Telehealth: Payer: Self-pay

## 2023-12-20 ENCOUNTER — Telehealth: Payer: Self-pay | Admitting: Pharmacy Technician

## 2023-12-20 DIAGNOSIS — Z5986 Financial insecurity: Secondary | ICD-10-CM

## 2023-12-20 NOTE — Progress Notes (Signed)
 Pharmacy Medication Assistance Program Note    12/20/2023  Patient ID: Eric Andrews, male   DOB: [birth year], 73 y.o.   MRN: [id number]     10/25/2023 11/17/2023 12/20/2023  Outreach Medication One  Initial Outreach Date (Medication One) 10/25/2023    Manufacturer Medication One Novo Nordisk    [employer] Drugs Tresiba     Dose of Novolog  100 units/ml    Dose of Ozempic  4mg /58ml    Dose of Tresiba  100 units/ml    Type of Assistance Manufacturer Assistance    Date Application Sent to Patient 10/27/2023    Application Items Requested Application;Proof of Income;Other    Date Application Sent to Prescriber 10/27/2023    Name of Prescriber Marsa Officer    Date Application Received From Patient  11/10/2023   Application Items Received From Patient  Application;Proof of Income;Other   Date Application Received From Provider  10/27/2023   Date Application Submitted to Manufacturer  11/17/2023   Method Application Sent to Manufacturer  Fax   Patient Assistance Determination   Approved  Approval Start Date   12/08/2023  Approval End Date   12/06/2024  Additional Outreach Contact   Provider  Contacted Provider   Message         10/25/2023 11/17/2023 12/20/2023  Outreach Medication Two  Initial Outreach Date (Medication Two) 10/25/2023    Manufacturer Medication Two [employer]    [employer] Drugs Farxiga     Dose of Farxiga  10mg     Type of Radiographer, Therapeutic Assistance    Date Application Sent to Patient 10/27/2023    Application Items Requested Application;Proof of Income;Other    Date Application Sent to Prescriber 10/27/2023    Name of Prescriber Marsa Officer    Date Application Received From Patient  11/10/2023   Application Items Received From Patient  Application;Proof of Income;Other   Date Application Received From Provider  10/27/2023   Method Application Sent to Manufacturer  Fax   Date Application Submitted to Manufacturer  11/17/2023   Patient  Assistance Determination   Approved  Approval Start Date   12/08/2023  Additional Outreach Contact   Provider  Contacted Provider   Message    Two care coordination calls placed to AZ&ME in regard to Farxiga  and to [employer]isk in regard to Novolog , Tresiba  and Ozempic .  First call placed to AZ[employer]ME. Per IVR system, patient is APPROVED 12/08/23-12/06/24 for Farxiga .Medication should auto fill and ship to patient's home. Patient may call AZ[employer]ME at [phone] at any time to check on shipments.  Second call placed to Novo Nordisk. Spoke to [place] who informs patient is APPROVED 12/08/23-12/06/24 for Novolog , Tresiba  and Ozempic . Medications should auto fill and ship to prescriber's office. Patient may call Nov[employer] at any time to check on shipments by calling [phone].  In basket message sent to [facility] PharmD to notify patient at their next scheduled office visit.  Signature  Kate Marzette Sola [facility]  Office: [phone] Fax: [phone] Email: [email or web address]

## 2023-12-24 ENCOUNTER — Other Ambulatory Visit: Payer: Medicare Other

## 2023-12-31 ENCOUNTER — Other Ambulatory Visit: Payer: Medicare Other | Admitting: Pharmacist

## 2023-12-31 DIAGNOSIS — Z794 Long term (current) use of insulin: Secondary | ICD-10-CM

## 2023-12-31 NOTE — Progress Notes (Signed)
12/31/2023 Name: Eric Andrews MRN: [id number] DOB: [birth year]  Chief Complaint  Patient presents with   Medication Assistance    Eric Andrews is a 73 y.o. year old male who presented for a telephone visit.   They were referred to the pharmacist by their PCP for assistance in managing medication access.      Subjective:   Care Team: Primary Care Provider: Smitty Cords, DO; Next Scheduled Visit: 02/22/2024 Urology: Riki Altes, MD Neurology: Alphonzo Lemmings, MD [facility] Pulmonology: Salena Saner, MD   Medication Access/Adherence  Current Pharmacy:  [facility] [phone] [place] D[place], [place] - [address] AT [facility] OF [address] [place] [address] Phone: [phone] Fax: [phone]  [facility] #[id number], [place] - [address] AT [facility] OF [address] [place] [address] Phone: [phone] Fax: [phone]   Patient reports affordability concerns with their medications: No  Patient reports access/transportation concerns to their pharmacy: No  Patient reports adherence concerns with their medications:  No    Received message from CPhT advising patient has been approved for re-enrollment in [place] patient assistance for [place] and [employer] patient assistance for [employer], Tresiba, Ozempic and pen needles   Diabetes:   Current medications:  Tresiba 50 units daily in evening Novolog 12-14 units three times daily with meals  Metformin ER 500 mg - 2 tablets (1000 mg) twice daily Pioglitazone 15 mg once daily Farxiga 10 mg daily  Ozempic 1 mg weekly   Current glucose readings: recalls recent morning fasting ranging 110-115   Patient denies hypoglycemic s/sx including dizziness, shakiness, sweating.    Current medication access support: Patient enrolled in assistance programs from [employer] for [place], [place] and  [place] and from [employer]&Me for Farxiga through 12/06/2024     Objective:  Lab Results  Component Value Date   HGBA1C 6.9 (A) 08/17/2023    Lab Results  Component Value Date   CREATININE 1.00 05/19/2023   BUN 20 05/19/2023   NA 141 05/19/2023   K 4.3 05/19/2023   CL 103 05/19/2023   CO2 24 05/19/2023    Lab Results  Component Value Date   CHOL 122 07/01/2021   HDL 33 (L) 07/01/2021   LDLCALC 62 07/01/2021   TRIG 209 (H) 07/01/2021   CHOLHDL 3.7 07/01/2021    Medications Reviewed Today     Reviewed by Manuela Neptune, RPH-CPP (Pharmacist) on 12/31/23 at 1115  Med List Status: <None>   Medication Order Taking? Sig Documenting Provider Last Dose Status Informant  Ascorbic Acid (VITAMIN C) 1000 MG tablet [id number]  Take 1,000 mg by mouth daily. [provider]  Active Self  aspirin EC 81 MG tablet [id number]  Take 81 mg by mouth daily. Swallow whole. [provider]  Active   busPIRone (BUSPAR) 15 MG tablet [id number]  Take 15 mg by mouth daily. [provider]  Active   celecoxib (CELEBREX) 200 MG capsule [id number]  TAKE 1 CAPSULE(200 MG) BY MOUTH TWICE DAILY Karamalegos, Netta Neat, DO  Active   cetirizine (ZYRTEC) 10 MG tablet [id number]  TAKE 1 TABLET(10 MG) BY MOUTH AT BEDTIME Smitty Cords, DO  Active   chlorthalidone (HYGROTON) 25 MG tablet [id number]  TAKE 1 TABLET(25 MG) BY MOUTH DAILY Karamalegos, Netta Neat, DO  Active   cholecalciferol (VITAMIN D3) 25 MCG (1000 UNIT) tablet [id number]  Take 1,000 Units by mouth in the morning and at bedtime. [provider]  Active Self  FARXIGA 10 MG TABS tablet [id number] Yes Take 1 tablet (10 mg total) by mouth daily before breakfast. Smitty Cords, DO Taking Active   fenofibrate micronized (LOFIBRA) 134 MG capsule [id number]  TAKE 1 CAPSULE(134 MG) BY MOUTH DAILY BEFORE BREAKFAST Karamalegos, Netta Neat, DO  Active   finasteride (PROSCAR) 5 MG tablet [id number]  TAKE 1  TABLET(5 MG) BY MOUTH EVERY EVENING Karamalegos, Netta Neat, DO  Active   fluticasone (FLONASE) 50 MCG/ACT nasal spray [id number]  INSTILL 2 SPRAYS INTO BOTH NOSTRILS EVERY DAY Smitty Cords, DO  Active            Med Note Zachery Dauer, Dorann Ou   Fri Jul 30, 2023  2:48 PM) Takes PRN  gabapentin (NEURONTIN) 600 MG tablet [id number]  Take 600 mg by mouth in the morning, at noon, in the evening, and at bedtime. [provider]  Active Self           Med Note Alphonzo Dublin   Tue Apr 07, 2022  5:10 PM)    hydrocortisone 2.5 % cream [id number]  APPLY TOPICALLY TO THE AFFECTED AREA OF FACE AND EARS 3 DAYS A WEEK ON MONDAYS, WEDNESDAYS, FRIDAYS Deirdre Evener, MD  Active   insulin degludec (TRESIBA FLEXTOUCH) 100 UNIT/ML FlexTouch Pen [id number]  Inject 68 Units into the skin at bedtime.  Patient taking differently: Inject 50 Units into the skin every evening.   Smitty Cords, DO  Active Self           Med Note Ronney Asters, Meiling Hendriks A   Fri Oct 15, 2023 11:20 AM)    Insulin Pen Needle 31G X 5 MM MISC [id number]  Use with insulin to inject into skin 3 times daily as directed Smitty Cords, DO  Active Self  ketoconazole (NIZORAL) 2 % cream [id number]  APPLY EXTERNALLY TO THE AFFECTED AREA OF FACE AND EARS 3 days a week on [place], THURSDAY, AND SATURDAY Deirdre Evener, MD  Active   losartan (COZAAR) 50 MG tablet [id number]  TAKE 1 TABLET(50 MG) BY MOUTH DAILY Smitty Cords, DO  Active   metFORMIN (GLUCOPHAGE-XR) 500 MG 24 hr tablet [id number] Yes TAKE 2 TABLETS BY MOUTH TWICE DAILY WITH A MEAL Karamalegos, Netta Neat, DO Taking Active   Multiple Vitamin (MULTIVITAMIN) capsule [id number]  Take 1 capsule by mouth daily. [provider]  Active Self  NOVOLOG FLEXPEN 100 UNIT/ML FlexPen [id number] Yes Inject 12-14 Units into the skin 2 (two) times daily. Sliding scale Smitty Cords, DO Taking Active Self  Omega-3 Fatty Acids (FISH OIL) 1200 MG  CAPS [id number]  Take 1,200 mg by mouth in the morning and at bedtime. [provider]  Active Self  omeprazole (PRILOSEC) 20 MG capsule [id number]  TAKE 1 CAPSULE BY MOUTH DAILY BEFORE BREAKFAST Karamalegos, Netta Neat, DO  Active   OZEMPIC, 1 MG/DOSE, 4 MG/3ML SOPN [id number] Yes Inject 1 mg into the skin once a week. Smitty Cords, DO Taking Active Self  pioglitazone (ACTOS) 15 MG tablet [id number] Yes TAKE 1 TABLET BY MOUTH DAILY(OFFICE VISIT NEEDED FOR ADDITIONAL REFILLS-CALL OFFICE TO SCHEDULE) Althea Charon, Netta Neat, DO Taking Active   pregabalin (LYRICA) 50 MG capsule [id number]  Take 50 mg by mouth in the morning, at noon, in the evening, and at bedtime. [provider]  Active   QUEtiapine (SEROQUEL) 25 MG tablet [id number]  Take 25  mg by mouth at bedtime. [provider]  Active   tamsulosin (FLOMAX) 0.4 MG CAPS capsule [id number]  TAKE 1 CAPSULE BY MOUTH EVERY NIGHT AT BEDTIME FOR URINE RETENTION Smitty Cords, DO  Active   traMADol (ULTRAM) 50 MG tablet [id number]  Take 50 mg by mouth 3 (three) times daily as needed. [provider]  Active            Med Note Ronney Asters, [facility] A   Fri Oct 15, 2023 10:58 AM)    traZODone (DESYREL) 100 MG tablet [id number]  Take 1.5 tablets (150 mg total) by mouth at bedtime. Smitty Cords, DO  Active   venlafaxine XR (EFFEXOR-XR) 150 MG 24 hr capsule [id number]  TAKE 1 CAPSULE(150 MG) BY MOUTH DAILY WITH BREAKFAST Karamalegos, Netta Neat, DO  Active               Assessment/Plan:   Confirms monitoring for dizziness or sedation with taking gabapentin, pregabalin, tramadol and trazodone as prescribed by Neurologist. Reports lays down when needed   Diabetes: - Currently controlled - Have reviewed with patient signs of and how to manage hypoglycemia. Encouraged patient to consider carrying glucose tablets with him - Recommend to check glucose and keep log of results, have this log to  review at upcoming appointments, but to contact office sooner if needed for readings outside of established parameters or for symptoms - Patient to follow up with AZ&Me patient assistance as needed for refills of Farxiga and with [employer] patient assistance as needed for refills of Novolog, Tresiba, Ozempic and pen needles   Follow Up Plan:  Clinical Pharmacist will follow up with patient by telephone on 09/25/2024 at 10:30 AM      Estelle Grumbles, PharmD, P[place]y [place], CPP Clinical Pharmacist [facility] [phone]

## 2023-12-31 NOTE — Patient Instructions (Signed)
Goals Addressed             This Visit's Progress    Pharmacy Goals       If you need to reach out to patient assistance programs regarding refills or to find out the status of your application, you can do so by calling:  Novo [employer]isk at [phone] AZ&Me at [phone]  Our goal A1c is less than 7%. This corresponds with fasting sugars less than 130 and 2 hour after meal sugars less than 180. Please check your blood sugar and keep a log of the results  Please check your home blood pressure, keep a log of the results and bring this with you to your medical appointments.  Our goal bad cholesterol, or LDL, is less than 70 . This is why it is important to continue taking your atorvastatin.  Estelle Grumbles, PharmD, [facility] Clinical Pharmacist  [facility] [phone]

## 2024-01-03 ENCOUNTER — Other Ambulatory Visit: Payer: Self-pay | Admitting: Family Medicine

## 2024-01-03 ENCOUNTER — Other Ambulatory Visit: Payer: Self-pay | Admitting: Pharmacist

## 2024-01-03 ENCOUNTER — Encounter: Payer: Self-pay | Admitting: Family Medicine

## 2024-01-03 DIAGNOSIS — Z794 Long term (current) use of insulin: Secondary | ICD-10-CM

## 2024-01-03 DIAGNOSIS — I700 Atherosclerosis of aorta: Secondary | ICD-10-CM | POA: Insufficient documentation

## 2024-01-03 MED ORDER — ATORVASTATIN CALCIUM 20 MG PO TABS: 20.0000 mg | ORAL_TABLET | Freq: Every day | ORAL | 0 refills | Status: DC

## 2024-01-03 MED ORDER — ATORVASTATIN CALCIUM 20 MG PO TABS: 20.0000 mg | ORAL_TABLET | Freq: Every day | ORAL | 3 refills | Status: AC

## 2024-01-03 NOTE — Patient Instructions (Signed)
Goals Addressed             This Visit's Progress    Pharmacy Goals       If you need to reach out to patient assistance programs regarding refills or to find out the status of your application, you can do so by calling:  Novo [employer]isk at [phone] AZ&Me at [phone]  Our goal A1c is less than 7%. This corresponds with fasting sugars less than 130 and 2 hour after meal sugars less than 180. Please check your blood sugar and keep a log of the results  Please check your home blood pressure, keep a log of the results and bring this with you to your medical appointments.  Our goal bad cholesterol, or LDL, is less than 70 . This is why it is important to continue taking your atorvastatin.  Estelle Grumbles, PharmD, [facility] Clinical Pharmacist  [facility] [phone]

## 2024-01-03 NOTE — Progress Notes (Signed)
   01/03/2024  Patient ID: Eric Andrews, male   DOB: [birth year], 73 y.o.   MRN: [id number]  Receive message from PCP advising office received a message from patient's pharmacy advising that a prior authorization is needed for patient's fenofibrate as current product is non-preferred.  Outreach to patient and find that he recently switched Medicare prescription coverage to [facility] Preferred Select. From review of [employer] Medicare website, note that his current form of fenofibrate, fenofibrate micronized 134 MG capsule, is a tier 3 option through patient's plan, while fenofibrate oral tablet 160 mg or 54 mg is a tier 2 option.  From review of chart, appears last lipid panel checked in 2022:  Lab Results  Component Value Date   CHOL 122 07/01/2021   HDL 33 (L) 07/01/2021   LDLCALC 62 07/01/2021   TRIG 209 (H) 07/01/2021   CHOLHDL 3.7 07/01/2021   Today patient also shares that he is no longer taking atorvastatin. Reports ran out of atorvastatin months ago and stopped taking as pharmacy told him that his refill request was denied  Collaborate with PCP via Secure chat regarding medication management and lipid panel lab work - PCP advises okay to restart atorvastatin. CPP sends 90 day supply of atorvastatin 20 mg daily to pharmacy for patient - Patient to follow up with provider as scheduled for Office Visit and lab work, including lipid panel, in March to determine whether to restart an additional agent for cholesterol management, such as ezetimibe, Vascepa or fenofibrate, at that time.  Provide counseling to patient regarding cost savings with receiving 100-day supply prescriptions from mail order through his prescription plan - Patient will follow up with [employer] mail order and let PCP/office know if would like to make this change for cost savings  Follow Up Plan:  Clinical Pharmacist will follow up with patient by telephone on 09/25/2024 at 10:30 AM      Estelle Grumbles, PharmD, P[place]y [place],  CPP Clinical Pharmacist [facility] [phone]

## 2024-01-15 ENCOUNTER — Other Ambulatory Visit: Payer: Self-pay | Admitting: Family Medicine

## 2024-01-15 DIAGNOSIS — I10 Essential (primary) hypertension: Secondary | ICD-10-CM

## 2024-01-17 ENCOUNTER — Telehealth: Payer: Self-pay

## 2024-01-17 NOTE — Telephone Encounter (Signed)
 Requested Prescriptions  Pending Prescriptions Disp Refills   losartan  (COZAAR ) 50 MG tablet [Pharmacy Med Name: LOSARTAN  50MG  TABLETS] 90 tablet 0    Sig: TAKE 1 TABLET(50 MG) BY MOUTH DAILY     Cardiovascular:  Angiotensin Receptor Blockers Failed - 01/17/2024 11:40 AM      Failed - Cr in normal range and within 180 days    Creat  Date Value Ref Range Status  07/01/2021 1.12 0.70 - 1.35 mg/dL Final   Creatinine, Ser  Date Value Ref Range Status  05/19/2023 1.00 0.76 - 1.27 mg/dL Final         Failed - K in normal range and within 180 days    Potassium  Date Value Ref Range Status  05/19/2023 4.3 3.5 - 5.2 mmol/L Final         Passed - Patient is not pregnant      Passed - Last BP in normal range    BP Readings from Last 1 Encounters:  11/03/23 121/68         Passed - Valid encounter within last 6 months    Recent Outpatient Visits           3 months ago Type 2 diabetes mellitus with other specified complication, with long-term current use of insulin  [facility])   [place] [facility] Delles, Timoteo Force A, RPH-CPP   3 months ago Type 2 diabetes mellitus with other specified complication, with long-term current use of insulin  [facility])   [place] [facility] [place], Kayleen Party, DO   5 months ago Type 2 diabetes mellitus with other specified complication, with long-term current use of insulin  [facility])   [place] [facility] [place], Kayleen Party, DO   8 months ago Otalgia of right ear   [place] [facility] [place], Kayleen Party, DO   8 months ago Otalgia of right ear   [facility] [place], Rankin Buzzard, NP       Future Appointments             In 1 month [place], Kayleen Party, DO [facility], [facility]   In 2 months Elta Halter, MD [facility]   In 9 months Stoioff, Kizzie Perks, MD [facility] [place]

## 2024-01-17 NOTE — Telephone Encounter (Signed)
 Thank you for the update. This was handled by Arthur Lash [facility] CPP on 01/03/24. There is a Patient Outreach note.  Fenofibrate  is no longer preferred. He has been taken off medication. He is on Atorvastatin .  Domingo Friend, DO [facility] 01/17/2024, 11:32 AM

## 2024-01-17 NOTE — Telephone Encounter (Signed)
 Attempted to return call and was placed on hold. Patient no longer need this medication.

## 2024-01-17 NOTE — Telephone Encounter (Signed)
 Copied from CRM [id number]. Topic: General - Other >> Jan 14, 2024  4:05 PM Ja-Kwan M wrote: Reason for CRM: Lonzell Robin with [facility] called regarding Rx for fenofibrate  micronized (LOFIBRA) 134 MG capsule. Lonzell Robin stated that the timer expires today and requests call back at [phone] Case# [id number]

## 2024-02-14 ENCOUNTER — Other Ambulatory Visit: Payer: Medicare (Managed Care)

## 2024-02-14 DIAGNOSIS — I10 Essential (primary) hypertension: Secondary | ICD-10-CM

## 2024-02-14 DIAGNOSIS — E1169 Type 2 diabetes mellitus with other specified complication: Secondary | ICD-10-CM

## 2024-02-14 DIAGNOSIS — N401 Enlarged prostate with lower urinary tract symptoms: Secondary | ICD-10-CM

## 2024-02-14 DIAGNOSIS — Z0000 Encounter for general adult medical examination without abnormal findings: Secondary | ICD-10-CM

## 2024-02-15 LAB — COMPLETE METABOLIC PANEL WITH GFR
AG Ratio: 2 (calc) (ref 1.0–2.5)
ALT: 64 U/L — ABNORMAL HIGH (ref 9–46)
AST: 37 U/L — ABNORMAL HIGH (ref 10–35)
Albumin: 4.3 g/dL (ref 3.6–5.1)
Alkaline phosphatase (APISO): 39 U/L (ref 35–144)
BUN: 25 mg/dL (ref 7–25)
CO2: 34 mmol/L — ABNORMAL HIGH (ref 20–32)
Calcium: 9.7 mg/dL (ref 8.6–10.3)
Chloride: 101 mmol/L (ref 98–110)
Creat: 0.85 mg/dL (ref 0.70–1.28)
Globulin: 2.2 g/dL (ref 1.9–3.7)
Glucose, Bld: 147 mg/dL — ABNORMAL HIGH (ref 65–99)
Potassium: 4 mmol/L (ref 3.5–5.3)
Sodium: 142 mmol/L (ref 135–146)
Total Bilirubin: 0.5 mg/dL (ref 0.2–1.2)
Total Protein: 6.5 g/dL (ref 6.1–8.1)
eGFR: 93 mL/min/{1.73_m2} (ref >=60)

## 2024-02-15 LAB — CBC WITH DIFFERENTIAL/PLATELET
Absolute Lymphocytes: 1529 {cells}/uL (ref 850–3900)
Absolute Monocytes: 549 {cells}/uL (ref 200–950)
Basophils Absolute: 67 {cells}/uL (ref 0–200)
Basophils Relative: 1.2 %
Eosinophils Absolute: 319 {cells}/uL (ref 15–500)
Eosinophils Relative: 5.7 %
HCT: 48.7 % (ref 38.5–50.0)
Hemoglobin: 16 g/dL (ref 13.2–17.1)
MCH: 28 pg (ref 27.0–33.0)
MCHC: 32.9 g/dL (ref 32.0–36.0)
MCV: 85.1 fL (ref 80.0–100.0)
MPV: 11.9 fL (ref 7.5–12.5)
Monocytes Relative: 9.8 %
Neutro Abs: 3136 {cells}/uL (ref 1500–7800)
Neutrophils Relative %: 56 %
Platelets: 149 10*3/uL (ref 140–400)
RBC: 5.72 10*6/uL (ref 4.20–5.80)
RDW: 13.4 % (ref 11.0–15.0)
Total Lymphocyte: 27.3 %
WBC: 5.6 10*3/uL (ref 3.8–10.8)

## 2024-02-15 LAB — HEMOGLOBIN A1C
Hgb A1c MFr Bld: 6.8 %{Hb} — ABNORMAL HIGH (ref <5.7)
Mean Plasma Glucose: 148 mg/dL
eAG (mmol/L): 8.2 mmol/L

## 2024-02-15 LAB — LIPID PANEL
Cholesterol: 116 mg/dL (ref <200)
HDL: 35 mg/dL — ABNORMAL LOW (ref >=40)
LDL Cholesterol (Calc): 56 mg/dL
Non-HDL Cholesterol (Calc): 81 mg/dL (ref <130)
Total CHOL/HDL Ratio: 3.3 (calc) (ref <5.0)
Triglycerides: 177 mg/dL — ABNORMAL HIGH (ref <150)

## 2024-02-15 LAB — PSA: PSA: 0.13 ng/mL (ref <=4.00)

## 2024-02-15 LAB — TSH: TSH: 2.12 m[IU]/L (ref 0.40–4.50)

## 2024-02-18 ENCOUNTER — Other Ambulatory Visit: Payer: Self-pay | Admitting: Family Medicine

## 2024-02-18 DIAGNOSIS — M51369 Other intervertebral disc degeneration, lumbar region without mention of lumbar back pain or lower extremity pain: Secondary | ICD-10-CM

## 2024-02-18 DIAGNOSIS — E1169 Type 2 diabetes mellitus with other specified complication: Secondary | ICD-10-CM

## 2024-02-18 NOTE — Telephone Encounter (Signed)
 Pt. Has appointment. Requested Prescriptions  Pending Prescriptions Disp Refills   pioglitazone (ACTOS) 15 MG tablet [Pharmacy Med Name: PIOGLITAZONE 15MG  TABLETS] 90 tablet 0    Sig: TAKE 1 TABLET BY MOUTH DAILY(OFFICE VISIT NEEDED FOR ADDITIONAL REFILLS-CALL OFFICE TO SCHEDULE)     Endocrinology:  Diabetes - Glitazones - pioglitazone Passed - 02/18/2024  3:20 PM      Passed - HBA1C is between 0 and 7.9 and within 180 days    Hgb A1c MFr Bld  Date Value Ref Range Status  02/14/2024 6.8 (H) <5.7 % of total Hgb Final    Comment:    For someone without known diabetes, a hemoglobin A1c value of 6.5% or greater indicates that they may have  diabetes and this should be confirmed with a follow-up  test. . For someone with known diabetes, a value <7% indicates  that their diabetes is well controlled and a value  greater than or equal to 7% indicates suboptimal  control. A1c targets should be individualized based on  duration of diabetes, age, comorbid conditions, and  other considerations. . Currently, no consensus exists regarding use of hemoglobin A1c for diagnosis of diabetes for children. Verna Czech - Valid encounter within last 6 months    Recent Outpatient Visits           4 months ago Type 2 diabetes mellitus with other specified complication, with long-term current use of insulin (HCC)   [place] [facility] Delles, Gentry Fitz A, RPH-CPP   4 months ago Type 2 diabetes mellitus with other specified complication, with long-term current use of insulin [facility])   [place] [facility] [place], Netta Neat, DO   6 months ago Type 2 diabetes mellitus with other specified complication, with long-term current use of insulin [facility])   [place] [facility] Smitty Cords, DO   9 months ago Otalgia of right ear   [place] [facility] Smitty Cords, DO   9 months ago Otalgia of right  ear   [place] [facility] [place], Salvadore Oxford, NP       Future Appointments             In 4 days Althea Charon, Netta Neat, DO [place] [facility] [place]   In 1 month Deirdre Evener, MD [facility]   In 8 months Stoioff, Verna Czech, MD [facility]

## 2024-02-18 NOTE — Telephone Encounter (Signed)
 Requested Prescriptions  Pending Prescriptions Disp Refills   celecoxib (CELEBREX) 200 MG capsule [Pharmacy Med Name: CELECOXIB 200MG  CAPSULES] 180 capsule 0    Sig: TAKE 1 CAPSULE(200 MG) BY MOUTH TWICE DAILY     Analgesics:  COX2 Inhibitors Failed - 02/18/2024  4:36 PM      Failed - Manual Review: Labs are only required if the patient has taken medication for more than 8 weeks.      Failed - AST in normal range and within 360 days    AST  Date Value Ref Range Status  02/14/2024 37 (H) 10 - 35 U/L Final         Failed - ALT in normal range and within 360 days    ALT  Date Value Ref Range Status  02/14/2024 64 (H) 9 - 46 U/L Final         Passed - HGB in normal range and within 360 days    Hemoglobin  Date Value Ref Range Status  02/14/2024 16.0 13.2 - 17.1 g/dL Final  16/09/9603 54.0 13.0 - 17.7 g/dL Final         Passed - Cr in normal range and within 360 days    Creat  Date Value Ref Range Status  02/14/2024 0.85 0.70 - 1.28 mg/dL Final         Passed - HCT in normal range and within 360 days    HCT  Date Value Ref Range Status  02/14/2024 48.7 38.5 - 50.0 % Final   Hematocrit  Date Value Ref Range Status  05/19/2023 46.9 37.5 - 51.0 % Final         Passed - eGFR is 30 or above and within 360 days    GFR, Estimated  Date Value Ref Range Status  12/22/2022 >60 >60 mL/min Final    Comment:    (NOTE) Calculated using the CKD-EPI Creatinine Equation (2021)    eGFR  Date Value Ref Range Status  02/14/2024 93 > OR = 60 mL/min/1.61m2 Final  05/19/2023 80 >59 mL/min/1.73 Final         Passed - Patient is not pregnant      Passed - Valid encounter within last 12 months    Recent Outpatient Visits           4 months ago Type 2 diabetes mellitus with other specified complication, with long-term current use of insulin (HCC)   [place] [facility] Delles, Gentry Fitz A, RPH-CPP   4 months ago Type 2 diabetes mellitus with other specified  complication, with long-term current use of insulin [facility])   [place], Netta Neat, DO   6 months ago Type 2 diabetes mellitus with other specified complication, with long-term current use of insulin [facility])   [place] [facility] [place], Netta Neat, DO   9 months ago Otalgia of right ear   [facility] [place], Netta Neat, DO   9 months ago Otalgia of right ear   [place] [facility] [place], Salvadore [place], NP       Future Appointments             In 4 days Althea Charon, Netta Neat, DO [place] [facility], [place]   In 1 month Deirdre Evener, MD [facility]   In 8 months Stoioff, Verna Czech, MD [facility] [place]

## 2024-02-19 ENCOUNTER — Other Ambulatory Visit: Payer: Self-pay | Admitting: Family Medicine

## 2024-02-19 DIAGNOSIS — E1169 Type 2 diabetes mellitus with other specified complication: Secondary | ICD-10-CM

## 2024-02-21 ENCOUNTER — Other Ambulatory Visit: Payer: Self-pay

## 2024-02-21 DIAGNOSIS — E1169 Type 2 diabetes mellitus with other specified complication: Secondary | ICD-10-CM

## 2024-02-21 MED ORDER — METFORMIN HCL ER 500 MG PO TB24: 500.0000 mg | ORAL_TABLET | Freq: Two times a day (BID) | ORAL | 1 refills | Status: DC

## 2024-02-21 NOTE — Telephone Encounter (Signed)
 Requested medication (s) are due for refill today- no  Requested medication (s) are on the active medication list -yes  Future visit scheduled -yes  Last refill: 02/21/24  Notes to clinic: Please review  medication direction on list- may not be the same as requested  Requested Prescriptions  Pending Prescriptions Disp Refills   metFORMIN (GLUCOPHAGE-XR) 500 MG 24 hr tablet [Pharmacy Med Name: METFORMIN ER 500MG  24HR TABS] 360 tablet 1    Sig: TAKE 2 TABLETS BY MOUTH TWICE DAILY WITH A MEAL     Endocrinology:  Diabetes - Biguanides Failed - 02/21/2024 12:24 PM      Failed - B12 Level in normal range and within 720 days    No results found for: "VITAMINB12"       Passed - Cr in normal range and within 360 days    Creat  Date Value Ref Range Status  02/14/2024 0.85 0.70 - 1.28 mg/dL Final         Passed - HBA1C is between 0 and 7.9 and within 180 days    Hgb A1c MFr Bld  Date Value Ref Range Status  02/14/2024 6.8 (H) <5.7 % of total Hgb Final    Comment:    For someone without known diabetes, a hemoglobin A1c value of 6.5% or greater indicates that they may have  diabetes and this should be confirmed with a follow-up  test. . For someone with known diabetes, a value <7% indicates  that their diabetes is well controlled and a value  greater than or equal to 7% indicates suboptimal  control. A1c targets should be individualized based on  duration of diabetes, age, comorbid conditions, and  other considerations. . Currently, no consensus exists regarding use of hemoglobin A1c for diagnosis of diabetes for children. .          Passed - eGFR in normal range and within 360 days    GFR, Estimated  Date Value Ref Range Status  12/22/2022 >60 >60 mL/min Final    Comment:    (NOTE) Calculated using the CKD-EPI Creatinine Equation (2021)    eGFR  Date Value Ref Range Status  02/14/2024 93 > OR = 60 mL/min/1.31m2 Final  05/19/2023 80 >59 mL/min/1.73 Final          Passed - Valid encounter within last 6 months    Recent Outpatient Visits           4 months ago Type 2 diabetes mellitus with other specified complication, with long-term current use of insulin (HCC)   [place] [facility] D[facility], Gentry Fitz A, RPH-CPP   4 months ago Type 2 diabetes mellitus with other specified complication, with long-term current use of insulin [facility])   [place] [facility] [place], Netta Neat, DO   6 months ago Type 2 diabetes mellitus with other specified complication, with long-term current use of insulin [facility])   [place] [facility] [place], Netta Neat, DO   9 months ago Otalgia of right ear   [facility] [place], Netta Neat, DO   9 months ago Otalgia of right ear   [place] [facility] [place], Salvadore Oxford, NP       Future Appointments             Tomorrow Althea Charon, Netta Neat, DO [place] [facility], [place]   In 1 month Deirdre Evener, MD [facility] [place]  In 8 months Stoioff, Verna Czech, MD [facility] [place]            Passed - CBC within normal limits and completed in the last 12 months    WBC  Date Value Ref Range Status  02/14/2024 5.6 3.8 - 10.8 Thousand/uL Final   RBC  Date Value Ref Range Status  02/14/2024 5.72 4.20 - 5.80 Million/uL Final   Hemoglobin  Date Value Ref Range Status  02/14/2024 16.0 13.2 - 17.1 g/dL Final  40/98/1191 47.8 13.0 - 17.7 g/dL Final   HCT  Date Value Ref Range Status  02/14/2024 48.7 38.5 - 50.0 % Final   Hematocrit  Date Value Ref Range Status  05/19/2023 46.9 37.5 - 51.0 % Final   MCHC  Date Value Ref Range Status  02/14/2024 32.9 32.0 - 36.0 g/dL Final    Comment:    For adults, a slight decrease in the calculated MCHC value (in the range of 30 to 32 g/dL) is most likely not clinically significant; however, it should be interpreted with  caution in correlation with other red cell parameters and the patient's clinical condition.    [facility]  Date Value Ref Range Status  02/14/2024 28.0 27.0 - 33.0 pg Final   MCV  Date Value Ref Range Status  02/14/2024 85.1 80.0 - 100.0 fL Final  05/19/2023 84 79 - 97 fL Final   No results found for: "PLTCOUNTKUC", "LABPLAT", "POCPLA" RDW  Date Value Ref Range Status  02/14/2024 13.4 11.0 - 15.0 % Final  05/19/2023 13.6 11.6 - 15.4 % Final            Requested Prescriptions  Pending Prescriptions Disp Refills   metFORMIN (GLUCOPHAGE-XR) 500 MG 24 hr tablet [Pharmacy Med Name: METFORMIN ER 500MG  24HR TABS] 360 tablet 1    Sig: TAKE 2 TABLETS BY MOUTH TWICE DAILY WITH A MEAL     Endocrinology:  Diabetes - Biguanides Failed - 02/21/2024 12:24 PM      Failed - B12 Level in normal range and within 720 days    No results found for: "VITAMINB12"       Passed - Cr in normal range and within 360 days    Creat  Date Value Ref Range Status  02/14/2024 0.85 0.70 - 1.28 mg/dL Final         Passed - HBA1C is between 0 and 7.9 and within 180 days    Hgb A1c MFr Bld  Date Value Ref Range Status  02/14/2024 6.8 (H) <5.7 % of total Hgb Final    Comment:    For someone without known diabetes, a hemoglobin A1c value of 6.5% or greater indicates that they may have  diabetes and this should be confirmed with a follow-up  test. . For someone with known diabetes, a value <7% indicates  that their diabetes is well controlled and a value  greater than or equal to 7% indicates suboptimal  control. A1c targets should be individualized based on  duration of diabetes, age, comorbid conditions, and  other considerations. . Currently, no consensus exists regarding use of hemoglobin A1c for diagnosis of diabetes for children. .          Passed - eGFR in normal range and within 360 days    GFR, Estimated  Date Value Ref Range Status  12/22/2022 >60 >60 mL/min Final    Comment:     (NOTE) Calculated using the CKD-EPI Creatinine Equation (2021)    eGFR  Date  Value Ref Range Status  02/14/2024 93 > OR = 60 mL/min/1.67m2 Final  05/19/2023 80 >59 mL/min/1.73 Final         Passed - Valid encounter within last 6 months    Recent Outpatient Visits           4 months ago Type 2 diabetes mellitus with other specified complication, with long-term current use of insulin (HCC)   [place] [facility] Delles, Gentry Fitz A, RPH-CPP   4 months ago Type 2 diabetes mellitus with other specified complication, with long-term current use of insulin (HCC)   [place] [facility] [place], Netta Neat, DO   6 months ago Type 2 diabetes mellitus with other specified complication, with long-term current use of insulin [facility])   [place] [facility] [place], Netta Neat, DO   9 months ago Otalgia of right ear   [place] [facility] [place], Netta Neat, DO   9 months ago Otalgia of right ear   [place] [facility] [place], Salvadore [place], NP       Future Appointments             Tomorrow Althea Charon, Netta Neat, DO [place] [facility], [place]   In 1 month Deirdre Evener, MD [place] [facility]   In 8 months Stoioff, Verna Czech, MD [facility] [place]            Passed - CBC within normal limits and completed in the last 12 months    WBC  Date Value Ref Range Status  02/14/2024 5.6 3.8 - 10.8 Thousand/uL Final   RBC  Date Value Ref Range Status  02/14/2024 5.72 4.20 - 5.80 Million/uL Final   Hemoglobin  Date Value Ref Range Status  02/14/2024 16.0 13.2 - 17.1 g/dL Final  40/98/1191 47.8 13.0 - 17.7 g/dL Final   HCT  Date Value Ref Range Status  02/14/2024 48.7 38.5 - 50.0 % Final   Hematocrit  Date Value Ref Range Status  05/19/2023 46.9 37.5 - 51.0 % Final   MCHC  Date Value Ref Range Status  02/14/2024 32.9 32.0 - 36.0 g/dL  Final    Comment:    For adults, a slight decrease in the calculated MCHC value (in the range of 30 to 32 g/dL) is most likely not clinically significant; however, it should be interpreted with caution in correlation with other red cell parameters and the patient's clinical condition.    [facility]  Date Value Ref Range Status  02/14/2024 28.0 27.0 - 33.0 pg Final   MCV  Date Value Ref Range Status  02/14/2024 85.1 80.0 - 100.0 fL Final  05/19/2023 84 79 - 97 fL Final   No results found for: "PLTCOUNTKUC", "LABPLAT", "POCPLA" RDW  Date Value Ref Range Status  02/14/2024 13.4 11.0 - 15.0 % Final  05/19/2023 13.6 11.6 - 15.4 % Final

## 2024-02-22 ENCOUNTER — Ambulatory Visit (INDEPENDENT_AMBULATORY_CARE_PROVIDER_SITE_OTHER): Payer: Medicare (Managed Care) | Admitting: Family Medicine

## 2024-02-22 ENCOUNTER — Encounter: Payer: Self-pay | Admitting: Family Medicine

## 2024-02-22 VITALS — BP 124/74 | HR 83 | Ht 69.0 in | Wt 305.0 lb

## 2024-02-22 DIAGNOSIS — Z794 Long term (current) use of insulin: Secondary | ICD-10-CM

## 2024-02-22 DIAGNOSIS — E1169 Type 2 diabetes mellitus with other specified complication: Secondary | ICD-10-CM | POA: Diagnosis not present

## 2024-02-22 DIAGNOSIS — H9313 Tinnitus, bilateral: Secondary | ICD-10-CM

## 2024-02-22 DIAGNOSIS — M150 Primary generalized (osteo)arthritis: Secondary | ICD-10-CM

## 2024-02-22 DIAGNOSIS — K580 Irritable bowel syndrome with diarrhea: Secondary | ICD-10-CM

## 2024-02-22 DIAGNOSIS — Z0000 Encounter for general adult medical examination without abnormal findings: Secondary | ICD-10-CM

## 2024-02-22 DIAGNOSIS — E785 Hyperlipidemia, unspecified: Secondary | ICD-10-CM

## 2024-02-22 DIAGNOSIS — Z6841 Body Mass Index (BMI) 40.0 and over, adult: Secondary | ICD-10-CM

## 2024-02-22 DIAGNOSIS — L602 Onychogryphosis: Secondary | ICD-10-CM

## 2024-02-22 MED ORDER — DICYCLOMINE HCL 10 MG PO CAPS: 10.0000 mg | ORAL_CAPSULE | Freq: Three times a day (TID) | ORAL | 0 refills | Status: DC

## 2024-02-22 NOTE — Patient Instructions (Addendum)
 Thank you for coming to the office today.  Shingrix Shingles vaccine - if you can send [place] the date you received the 1st and 2nd dose  Try increasing peppermint oil up to 3 times a day regularly at first.  Also can take Dicyclomine Bentyl as needed for abdominal symptoms with discomfort, cramping bloating gas diarrhea. As needed w/ meal.  ------------------------------  You have been referred for a Coronary Calcium Score Cardiac CT Scan. This is a screening test for patients aged 55-50+ with cardiovascular risk factors or who are healthy but would be interested in Cardiovascular Screening for heart disease. Even if there is a family history of heart disease, this imaging can be useful. Typically it can be done every 5+ years or at a different timeline we agree on  The scan will look at the chest and mainly focus on the heart and identify early signs of calcium build up or blockages within the heart arteries. It is not 100% accurate for identifying blockages or heart disease, but it is useful to help [place] predict who may have some early changes or be at risk in the future for a heart attack or cardiovascular problem.  The results are reviewed by a Cardiologist and they will document the results. It should become available on MyChart. Typically the results are divided into percentiles based on other patients of the same demographic and age. So it will compare your risk to others similar to you. If you have a higher score >99 or higher percentile >75%tile, it is recommended to consider Statin cholesterol therapy and or referral to Cardiologist. I will try to help explain your results and if we have questions we can contact the Cardiologist.  You will be contacted for scheduling. Usually it is done at any imaging facility through [facility], [facility] or [facility].  The cost is $99 flat fee total and it does not go through insurance, so no authorization is  required.  --------------------     Please schedule a Follow-up Appointment to: Return in about 6 months (around 08/24/2024) for 6 month DM A1c.  If you have any other questions or concerns, please feel free to call the office or send a message through MyChart. You may also schedule an earlier appointment if necessary.  Additionally, you may be receiving a survey about your experience at our office within a few days to 1 week by e-mail or mail. We value your feedback.  Saralyn Pilar, DO [facility], [place]

## 2024-02-22 NOTE — Progress Notes (Signed)
 Subjective:    Patient ID: Eric Andrews, male    DOB: December 13, 1950, 73 y.o.   MRN: [id number]  Eric Andrews is a 73 y.o. male presenting on 02/22/2024 for Annual Exam   HPI  Discussed the use of AI scribe software for clinical note transcription with the patient, who gave verbal consent to proceed.  History of Present Illness   Eric Andrews is a 73 year old male who presents for a routine follow-up and review of blood work results.      Major Depression, recurrent moderate Generalized Anxiety Disorder Oldest daughter recently passed after terminal cancer. He has been coping and managing this for some time now, he is optimistic on improving his mental health moving forward. Continues on Buspar 5mg  BID, feels less effective On Venlafaxine 150mg  daily  Followed by [facility] Neurology Dr Malvin Johns Updates on Sciatica and nerves   CHRONIC DM, Type 2 Morbid Obesity A1c 6.8 On PAP for medication assistance Meds: Humalog TID SSI with meals, Tresiba 68 units daily, Jardiance 25mg , Pioglitazone 15mg  - ON ozempic 0.5mg  weekly Reports  good compliance. Tolerating well w/o side-effects Currently on ARB Completed DM EYe exam DM Foot exam He cannot void today Denies hypoglycemia, polyuria, visual changes, numbness or tingling  Elevated LFTs Past history 47-58 ALT, and AST 37  BPH LUTS Improved on Tamsulosin   GERD On Omeprazole 20mg  daily   Functional GI Symptoms Diarrhea Bloating IBS like symptoms He experiences gastrointestinal symptoms, including diarrhea and gas, after eating certain foods. An endoscopy was normal, but symptoms persist. He uses Gas-X, omeprazole, Pepcid, and peppermint oil daily, which he can increase to three times a day. Imodium is used for severe diarrhea. Coffee causes stomach pain for about five minutes after consumption.  Bilateral Tinnitus He experiences intermittent tinnitus, described as a high-pitched sound lasting a few minutes, occurring every four to six  hours. He has noted a correlation with slightly elevated blood pressure during these episodes.  Thickened Toenails Request podiatry for trimming   Health Maintenance: PSA 0.13 negative   Completed Colonoscopy 2024, next due 2027   Prevnar20 updated 2022  Shingrix request copy of dates     02/22/2024    9:46 AM 08/17/2023    9:45 AM 07/30/2023    2:54 PM  Depression screen PHQ 2/9  Decreased Interest 0 1   Down, Depressed, Hopeless 1 0 1  PHQ - 2 Score 1 1 1   Altered sleeping 1 1   Tired, decreased energy 1 2 1   Feeling bad or failure about yourself  1 2   Trouble concentrating 0 2   Moving slowly or fidgety/restless 1 0   Suicidal thoughts 0 0   PHQ-9 Score 5 8   Difficult doing work/chores Somewhat difficult Not difficult at all Not difficult at all       02/22/2024    9:46 AM 08/17/2023    9:46 AM 11/23/2022   11:46 AM 08/11/2022    3:22 PM  GAD 7 : Generalized Anxiety Score  Nervous, Anxious, on Edge 2 1 2 2   Control/stop worrying 2 1 2 3   Worry too much - different things 2 1 3 1   Trouble relaxing 2 1 2 1   Restless 0 0 0 1  Easily annoyed or irritable 0 1 1 1   Afraid - awful might happen 1 0 0 0  Total GAD 7 Score 9 5 10 9   Anxiety Difficulty Somewhat difficult  Somewhat difficult Not difficult at all  Past Medical History:  Diagnosis Date   ADD (attention deficit disorder)    Anemia    Anxiety    Arthritis    Depression    Diabetes mellitus without complication (HCC)    Enlarged prostate    GERD (gastroesophageal reflux disease)    History of kidney stones    Hypertension    Knee pain    Neuromuscular disorder (HCC)    lower legs- neuropathy   Sleep apnea    Past Surgical History:  Procedure Laterality Date   APPENDECTOMY     CARPEL TUNEL Bilateral    COLONOSCOPY W/ POLYPECTOMY     COLONOSCOPY WITH PROPOFOL N/A 03/22/2023   Procedure: COLONOSCOPY WITH PROPOFOL;  Surgeon: Wyline Mood, MD;  Location: [facility] ENDOSCOPY;  Service: Gastroenterology;   Laterality: N/A;   ESOPHAGOGASTRODUODENOSCOPY (EGD) WITH PROPOFOL N/A 08/18/2023   Procedure: ESOPHAGOGASTRODUODENOSCOPY (EGD) WITH PROPOFOL;  Surgeon: Wyline Mood, MD;  Location: [facility] ENDOSCOPY;  Service: Gastroenterology;  Laterality: N/A;   HERNIA REPAIR     Left   JOINT REPLACEMENT     KNEE ARTHROSCOPY     Left   RIGHT WRIST SX X2     TOTAL KNEE ARTHROPLASTY Left 04/21/2022   Procedure: TOTAL KNEE ARTHROPLASTY;  Surgeon: Kennedy Bucker, MD;  Location: [facility] ORS;  Service: Orthopedics;  Laterality: Left;   TOTAL SHOULDER ARTHROPLASTY Left 01/05/2023   Procedure: Left total shoulder arthroplasty, biceps tenodesis;  Surgeon: Signa Kell, MD;  Location: [facility] ORS;  Service: Orthopedics;  Laterality: Left;   vascetomy     Social History   Socioeconomic History   Marital status: Married    Spouse name: KRISTAPHER DUBUQUE   Number of children: 6   Years of education: Not on file   Highest education level: Not on file  Occupational History   Occupation: retired  Tobacco Use   Smoking status: Never   Smokeless tobacco: Never  Vaping Use   Vaping status: Never Used  Substance and Sexual Activity   Alcohol use: Not Currently   Drug use: Never   Sexual activity: Not Currently  Other Topics Concern   Not on file  Social History Narrative   Live with 2 daughters, 3 grandkids, 1 great grandskids   Social Drivers of Health   Financial Resource Strain: Low Risk  (07/30/2023)   Overall Financial Resource Strain (CARDIA)    Difficulty of Paying Living Expenses: Not very hard  Food Insecurity: No Food Insecurity (07/30/2023)   Hunger Vital Sign    Worried About Running Out of Food in the Last Year: Never true    Ran Out of Food in the Last Year: Never true  Transportation Needs: No Transportation Needs (07/30/2023)   PRAPARE - Administrator, Civil Service (Medical): No    Lack of Transportation (Non-Medical): No  Physical Activity: Insufficiently Active (07/30/2023)   Exercise Vital  Sign    Days of Exercise per Week: 2 days    Minutes of Exercise per Session: 20 min  Stress: No Stress Concern Present (07/30/2023)   [employer] of Occupational Health - Occupational Stress Questionnaire    Feeling of Stress : Only a little  Social Connections: Moderately Integrated (07/30/2023)   Social Connection and Isolation Panel [NHANES]    Frequency of Communication with Friends and Family: More than three times a week    Frequency of Social Gatherings with Friends and Family: More than three times a week    Attends Religious Services: More than 4 times per year  Active Member of Clubs or Organizations: Not on file    Attends Club or Organization Meetings: Never    Marital Status: Married  Catering manager Violence: Not At Risk (07/30/2023)   Humiliation, Afraid, Rape, and Kick questionnaire    Fear of Current or Ex-Partner: No    Emotionally Abused: No    Physically Abused: No    Sexually Abused: No   Family History  Problem Relation Age of Onset   Heart attack Mother 75   Lung cancer Father    Alzheimer's disease Brother    Heart attack Maternal Grandfather 76   Current Outpatient Medications on File Prior to Visit  Medication Sig   Ascorbic Acid (VITAMIN C) 1000 MG tablet Take 1,000 mg by mouth daily.   aspirin EC 81 MG tablet Take 81 mg by mouth daily. Swallow whole.   atorvastatin (LIPITOR) 20 MG tablet Take 1 tablet (20 mg total) by mouth daily.   busPIRone (BUSPAR) 15 MG tablet Take 15 mg by mouth daily.   celecoxib (CELEBREX) 200 MG capsule TAKE 1 CAPSULE(200 MG) BY MOUTH TWICE DAILY   cetirizine (ZYRTEC) 10 MG tablet TAKE 1 TABLET(10 MG) BY MOUTH AT BEDTIME   chlorthalidone (HYGROTON) 25 MG tablet TAKE 1 TABLET(25 MG) BY MOUTH DAILY   cholecalciferol (VITAMIN D3) 25 MCG (1000 UNIT) tablet Take 1,000 Units by mouth in the morning and at bedtime.   FARXIGA 10 MG TABS tablet Take 1 tablet (10 mg total) by mouth daily before breakfast.   finasteride (PROSCAR)  5 MG tablet TAKE 1 TABLET(5 MG) BY MOUTH EVERY EVENING   fluticasone (FLONASE) 50 MCG/ACT nasal spray INSTILL 2 SPRAYS INTO BOTH NOSTRILS EVERY DAY   gabapentin (NEURONTIN) 600 MG tablet Take 600 mg by mouth in the morning, at noon, in the evening, and at bedtime.   hydrocortisone 2.5 % cream APPLY TOPICALLY TO THE AFFECTED AREA OF FACE AND EARS 3 DAYS A WEEK ON MONDAYS, WEDNESDAYS, FRIDAYS   insulin degludec (TRESIBA FLEXTOUCH) 100 UNIT/ML FlexTouch Pen Inject 68 Units into the skin at bedtime. (Patient taking differently: Inject 50 Units into the skin every evening.)   Insulin Pen Needle 31G X 5 MM MISC Use with insulin to inject into skin 3 times daily as directed   ketoconazole (NIZORAL) 2 % cream APPLY EXTERNALLY TO THE AFFECTED AREA OF FACE AND EARS 3 days a week on TUESDAY, THURSDAY, AND SATURDAY   losartan (COZAAR) 50 MG tablet TAKE 1 TABLET(50 MG) BY MOUTH DAILY   metFORMIN (GLUCOPHAGE-XR) 500 MG 24 hr tablet Take 1 tablet (500 mg total) by mouth 2 (two) times daily with a meal.   Multiple Vitamin (MULTIVITAMIN) capsule Take 1 capsule by mouth daily.   NOVOLOG FLEXPEN 100 UNIT/ML FlexPen Inject 12-14 Units into the skin 2 (two) times daily. Sliding scale   Omega-3 Fatty Acids (FISH OIL) 1200 MG CAPS Take 1,200 mg by mouth in the morning and at bedtime.   omeprazole (PRILOSEC) 20 MG capsule TAKE 1 CAPSULE BY MOUTH DAILY BEFORE BREAKFAST   OZEMPIC, 1 MG/DOSE, 4 MG/3ML SOPN Inject 1 mg into the skin once a week.   pioglitazone (ACTOS) 15 MG tablet TAKE 1 TABLET BY MOUTH DAILY(OFFICE VISIT NEEDED FOR ADDITIONAL REFILLS-CALL OFFICE TO SCHEDULE)   pregabalin (LYRICA) 50 MG capsule Take 50 mg by mouth in the morning, at noon, in the evening, and at bedtime.   QUEtiapine (SEROQUEL) 25 MG tablet Take 25 mg by mouth at bedtime.   tamsulosin (FLOMAX) 0.4 MG CAPS  capsule TAKE 1 CAPSULE BY MOUTH EVERY NIGHT AT BEDTIME FOR URINE RETENTION   traMADol (ULTRAM) 50 MG tablet Take 50 mg by mouth 3 (three)  times daily as needed.   traZODone (DESYREL) 100 MG tablet Take 1.5 tablets (150 mg total) by mouth at bedtime.   venlafaxine XR (EFFEXOR-XR) 150 MG 24 hr capsule TAKE 1 CAPSULE(150 MG) BY MOUTH DAILY WITH BREAKFAST   No current facility-administered medications on file prior to visit.    Review of Systems  Constitutional:  Negative for activity change, appetite change, chills, diaphoresis, fatigue and fever.  HENT:  Positive for tinnitus. Negative for congestion and hearing loss.   Eyes:  Negative for visual disturbance.  Respiratory:  Negative for cough, chest tightness, shortness of breath and wheezing.   Cardiovascular:  Negative for chest pain, palpitations and leg swelling.  Gastrointestinal:  Negative for abdominal pain, constipation, diarrhea, nausea and vomiting.  Genitourinary:  Negative for dysuria, frequency and hematuria.  Musculoskeletal:  Negative for arthralgias and neck pain.  Skin:  Negative for rash.  Neurological:  Negative for dizziness, weakness, light-headedness, numbness and headaches.  Hematological:  Negative for adenopathy.  Psychiatric/Behavioral:  Negative for behavioral problems, dysphoric mood and sleep disturbance.    Per HPI unless specifically indicated above     Objective:    BP 124/74   Pulse 83   Ht 5\' 9"  (1.753 m)   Wt (!) 305 lb (138.3 kg)   SpO2 97%   BMI 45.04 kg/m   Wt Readings from Last 3 Encounters:  02/22/24 (!) 305 lb (138.3 kg)  11/03/23 (!) 303 lb (137.4 kg)  10/29/23 (!) 303 lb 3.2 oz (137.5 kg)    Physical Exam Vitals and nursing note reviewed.  Constitutional:      General: He is not in acute distress.    Appearance: He is well-developed. He is obese. He is not diaphoretic.     Comments: Well-appearing, comfortable, cooperative  HENT:     Head: Normocephalic and atraumatic.  Eyes:     General:        Right eye: No discharge.        Left eye: No discharge.     Conjunctiva/sclera: Conjunctivae normal.     Pupils:  Pupils are equal, round, and reactive to light.  Neck:     Thyroid: No thyromegaly.  Cardiovascular:     Rate and Rhythm: Normal rate and regular rhythm.     Pulses: Normal pulses.     Heart sounds: Normal heart sounds. No murmur heard. Pulmonary:     Effort: Pulmonary effort is normal. No respiratory distress.     Breath sounds: Normal breath sounds. No wheezing or rales.  Abdominal:     General: Bowel sounds are normal. There is no distension.     Palpations: Abdomen is soft. There is no mass.     Tenderness: There is no abdominal tenderness.  Musculoskeletal:        General: No tenderness. Normal range of motion.     Cervical back: Normal range of motion and neck supple.     Right lower leg: No edema.     Left lower leg: No edema.     Comments: Upper / Lower Extremities: - Normal muscle tone, strength bilateral upper extremities 5/5, lower extremities 5/5  Lymphadenopathy:     Cervical: No cervical adenopathy.  Skin:    General: Skin is warm and dry.     Findings: No erythema or rash.  Neurological:  Mental Status: He is alert and oriented to person, place, and time.     Comments: Distal sensation intact to light touch all extremities  Psychiatric:        Mood and Affect: Mood normal.        Behavior: Behavior normal.        Thought Content: Thought content normal.     Comments: Well groomed, good eye contact, normal speech and thoughts     Results for orders placed or performed in visit on 02/14/24  TSH   Collection Time: 02/14/24  8:14 AM  Result Value Ref Range   TSH 2.12 0.40 - 4.50 mIU/L  PSA   Collection Time: 02/14/24  8:14 AM  Result Value Ref Range   PSA 0.13 < OR = 4.00 ng/mL  CBC with Differential/Platelet   Collection Time: 02/14/24  8:14 AM  Result Value Ref Range   WBC 5.6 3.8 - 10.8 Thousand/uL   RBC 5.72 4.20 - 5.80 Million/uL   Hemoglobin 16.0 13.2 - 17.1 g/dL   HCT 40.9 81.1 - 91.4 %   MCV 85.1 80.0 - 100.0 fL   MCH 28.0 27.0 - 33.0 pg    MCHC 32.9 32.0 - 36.0 g/dL   RDW 78.2 95.6 - 21.3 %   Platelets 149 140 - 400 Thousand/uL   MPV 11.9 7.5 - 12.5 fL   Neutro Abs 3,136 1,500 - 7,800 cells/uL   Absolute Lymphocytes 1,529 850 - 3,900 cells/uL   Absolute Monocytes 549 200 - 950 cells/uL   Eosinophils Absolute 319 15 - 500 cells/uL   Basophils Absolute 67 0 - 200 cells/uL   Neutrophils Relative % 56 %   Total Lymphocyte 27.3 %   Monocytes Relative 9.8 %   Eosinophils Relative 5.7 %   Basophils Relative 1.2 %  COMPLETE METABOLIC PANEL WITH GFR   Collection Time: 02/14/24  8:14 AM  Result Value Ref Range   Glucose, Bld 147 (H) 65 - 99 mg/dL   BUN 25 7 - 25 mg/dL   Creat 0.86 5.78 - 4.69 mg/dL   eGFR 93 > OR = 60 GE/XBM/8.41L2   BUN/Creatinine Ratio SEE NOTE: 6 - 22 (calc)   Sodium 142 135 - 146 mmol/L   Potassium 4.0 3.5 - 5.3 mmol/L   Chloride 101 98 - 110 mmol/L   CO2 34 (H) 20 - 32 mmol/L   Calcium 9.7 8.6 - 10.3 mg/dL   Total Protein 6.5 6.1 - 8.1 g/dL   Albumin 4.3 3.6 - 5.1 g/dL   Globulin 2.2 1.9 - 3.7 g/dL (calc)   AG Ratio 2.0 1.0 - 2.5 (calc)   Total Bilirubin 0.5 0.2 - 1.2 mg/dL   Alkaline phosphatase (APISO) 39 35 - 144 U/L   AST 37 (H) 10 - 35 U/L   ALT 64 (H) 9 - 46 U/L  Lipid panel   Collection Time: 02/14/24  8:14 AM  Result Value Ref Range   Cholesterol 116 <200 mg/dL   HDL 35 (L) > OR = 40 mg/dL   Triglycerides 440 (H) <150 mg/dL   LDL Cholesterol (Calc) 56 mg/dL (calc)   Total CHOL/HDL Ratio 3.3 <5.0 (calc)   Non-HDL Cholesterol (Calc) 81 <102 mg/dL (calc)  Hemoglobin V2Z   Collection Time: 02/14/24  8:14 AM  Result Value Ref Range   Hgb A1c MFr Bld 6.8 (H) <5.7 % of total Hgb   Mean Plasma Glucose 148 mg/dL   eAG (mmol/L) 8.2 mmol/L      Assessment &  Plan:   Problem List Items Addressed This Visit     Hyperlipidemia associated with type 2 diabetes mellitus (HCC)   Relevant Orders   CT CARDIAC SCORING (SELF PAY ONLY)   Morbid obesity with BMI of 45.0-49.9, adult (HCC)   Primary  osteoarthritis involving multiple joints   Type 2 diabetes mellitus with other specified complication (HCC)   Relevant Orders   CT CARDIAC SCORING (SELF PAY ONLY)   Ambulatory referral to Podiatry   Other Visit Diagnoses       Annual physical exam    -  Primary     Irritable bowel syndrome with diarrhea       Relevant Medications   dicyclomine (BENTYL) 10 MG capsule     Tinnitus of both ears         Overgrown toenails       Relevant Orders   Ambulatory referral to Podiatry        Updated Health Maintenance information Reviewed recent lab results with patient Encouraged improvement to lifestyle with diet and exercise Goal of weight loss   Functional Bowel Symptoms IBS / Diarrhea and Gas Intermittent diarrhea and gas, possibly related to IBS. Previous endoscopy and colonoscopy normal. Symptoms may be functional with no underlying disease. - Increase peppermint oil to up to three times daily. - Prescribe dicyclomine as needed for abdominal symptoms. - Continue loperamide as needed for severe diarrhea.  Elevated Liver Enzymes Mildly elevated liver enzymes, consistent with previous results. Likely related to cholesterol and weight. Considered possibly baseline for him. Cholesterol well-controlled with atorvastatin. - Continue atorvastatin 20 mg for cholesterol management. - Encourage weight management and lifestyle modifications.  Tinnitus Intermittent high-pitched ringing, possibly related to transient elevated blood pressure. No immediate concern for serious conditions. - Monitor symptoms and manage stress.  General Health Maintenance Up to date on most health maintenance items. Shingles vaccination status unclear. - Confirm dates of shingles vaccination and update records. - Encourage visit to pharmacy for shingles vaccine if not completed.  Follow-up Follow-up plans discussed for health maintenance and diagnostic procedures. - Schedule heart CT scan for coronary artery  disease assessment. - Refer to podiatry for toenail trimming and management. - Schedule follow-up appointment in six months to monitor blood sugar levels.         Orders Placed This Encounter  Procedures   CT CARDIAC SCORING (SELF PAY ONLY)    Standing Status:   Future    Expiration Date:   02/21/2025    Preferred imaging location?:   [facility]   Ambulatory referral to Podiatry    Referral Priority:   Routine    Referral Type:   Consultation    Referral Reason:   Specialty Services Required    Requested Specialty:   Podiatry    Number of Visits Requested:   1    Meds ordered this encounter  Medications   dicyclomine (BENTYL) 10 MG capsule    Sig: Take 1 capsule (10 mg total) by mouth 4 (four) times daily -  before meals and at bedtime. As needed.    Dispense:  60 capsule    Refill:  0     Follow up plan: Return in about 6 months (around 08/24/2024) for 6 month DM A1c.  Saralyn Pilar, DO [facility] 02/22/2024, 10:06 AM

## 2024-02-24 ENCOUNTER — Telehealth: Payer: Self-pay

## 2024-02-24 NOTE — Telephone Encounter (Signed)
 Copied from CRM [id number]. Topic: General - Other >> Feb 24, 2024  8:47 AM Haroldine Laws wrote: Reason for CRM: pt called to give information about his shingles vaccine  First one 05/29/23 and the second one 07/30/23  [facility] [place]

## 2024-02-29 ENCOUNTER — Ambulatory Visit
Admission: RE | Admit: 2024-02-29 | Discharge: 2024-02-29 | Disposition: A | Discharge status: Home / Self Care | Payer: Medicare (Managed Care) | Source: Ambulatory Visit | Attending: Family Medicine | Admitting: Family Medicine

## 2024-02-29 DIAGNOSIS — E1169 Type 2 diabetes mellitus with other specified complication: Secondary | ICD-10-CM

## 2024-02-29 DIAGNOSIS — E785 Hyperlipidemia, unspecified: Secondary | ICD-10-CM | POA: Insufficient documentation

## 2024-02-29 DIAGNOSIS — Z794 Long term (current) use of insulin: Secondary | ICD-10-CM

## 2024-03-02 ENCOUNTER — Encounter: Payer: Self-pay | Admitting: Family Medicine

## 2024-03-19 ENCOUNTER — Other Ambulatory Visit: Payer: Self-pay | Admitting: Family Medicine

## 2024-03-19 DIAGNOSIS — E1169 Type 2 diabetes mellitus with other specified complication: Secondary | ICD-10-CM

## 2024-03-20 MED ORDER — INSULIN PEN NEEDLE 31G X 5 MM MISC: 3 refills | Status: DC

## 2024-03-20 NOTE — Telephone Encounter (Signed)
 Copied from CRM [id number]. Topic: Clinical - Medication Refill >> Mar 20, 2024 12:12 PM Lane Pinon A wrote: Most Recent Primary Care Visit:  Provider: Raina Bunting  Department: [facility]  Visit Type: PHYSICAL  Date: 02/22/2024  Medication: Insulin Pen Needle 31G X 5 MM MISC  Has the patient contacted their pharmacy? No (Agent: If no, request that the patient contact the pharmacy for the refill. If patient does not wish to contact the pharmacy document the reason why and proceed with request.) (Agent: If yes, when and what did the pharmacy advise?)  Is this the correct pharmacy for this prescription? Yes If no, delete pharmacy and type the correct one.  This is the patient's preferred pharmacy:  [id number] - [place], [place] - [address] AT [facility] OF [address] [place] [address] [place] [address] Phone: [phone] Fax: [phone]  [facility] - [place], [place] - [address] [place] [address] Phone: [phone] Fax: [phone]  [facility]ORE [phone] [facility], [place] - [address] AT [facility] [address] [place] [address] [facility] [place] [address] Phone: [phone] Fax: [phone]   Has the prescription been filled recently? No  Is the patient out of the medication? No  Has the patient been seen for an appointment in the last year OR does the patient have an upcoming appointment? Yes  Can we respond through MyChart? Yes  Agent: Please be advised that Rx refills may take up to 3 business days. We ask that you follow-up with your pharmacy.  Patient has a voucher for [facility] for [facility] and wants a refill

## 2024-03-20 NOTE — Telephone Encounter (Signed)
 Copied from CRM [id number]. Topic: Clinical - Medication Refill >> Mar 20, 2024 12:09 PM Lane Pinon A wrote: Most Recent Primary Care Visit:  Provider: Raina Bunting  Department: [facility]  Visit Type: PHYSICAL  Date: 02/22/2024  Medication: Insulin Pen Needle 31G X 5 MM MISC  Has the patient contacted their pharmacy? Yes (Agent: If no, request that the patient contact the pharmacy for the refill. If patient does not wish to contact the pharmacy document the reason why and proceed with request.) (Agent: If yes, when and what did the pharmacy advise?)  Is this the correct pharmacy for this prescription? Yes If no, delete pharmacy and type the correct one.  This is the patient's preferred pharmacy:  [id number] - [place], [place] - [address] AT [facility] OF [address] [place] [address] [place] [address] Phone: [phone] Fax: [phone]  [facility] - [place], [place] - [address] [place] [address] Phone: [phone] Fax: [phone]  [facility]ORE [phone] [facility], [place] - [address] [facility] [address] AT [facility] OF [address] [place] [address] [facility] [place] [address] Phone: [phone] Fax: [phone]   Has the prescription been filled recently? No  Is the patient out of the medication? No  Has the patient been seen for an appointment in the last year OR does the patient have an upcoming appointment? Yes  Can we respond through MyChart? Yes  Agent: Please be advised that Rx refills may take up to 3 business days. We ask that you follow-up with your pharmacy.  Patient in patient assistant program gave him [facility] for a prescription 120 days supply for insulin pen

## 2024-03-20 NOTE — Telephone Encounter (Signed)
 You clarify with patient what this refill request is for?  The order linked is Fenofibrate, but the message says Insulin Pen Needle.  Also they have 3 different pharmacies listed.  Need to know is it both fenofibrate + needles and which pharmacy?  Domingo Friend, DO [facility] 03/20/2024, 1:14 PM

## 2024-03-21 ENCOUNTER — Other Ambulatory Visit: Payer: Self-pay | Admitting: Family Medicine

## 2024-03-21 DIAGNOSIS — J0110 Acute frontal sinusitis, unspecified: Secondary | ICD-10-CM

## 2024-03-22 ENCOUNTER — Other Ambulatory Visit: Payer: Self-pay | Admitting: Family Medicine

## 2024-03-22 ENCOUNTER — Encounter: Payer: Self-pay | Admitting: Podiatry

## 2024-03-22 ENCOUNTER — Ambulatory Visit: Payer: Medicare (Managed Care) | Admitting: Podiatry

## 2024-03-22 VITALS — Ht 69.0 in | Wt 305.0 lb

## 2024-03-22 DIAGNOSIS — L600 Ingrowing nail: Secondary | ICD-10-CM | POA: Diagnosis not present

## 2024-03-22 DIAGNOSIS — L603 Nail dystrophy: Secondary | ICD-10-CM | POA: Diagnosis not present

## 2024-03-22 DIAGNOSIS — I10 Essential (primary) hypertension: Secondary | ICD-10-CM

## 2024-03-22 DIAGNOSIS — E1142 Type 2 diabetes mellitus with diabetic polyneuropathy: Secondary | ICD-10-CM | POA: Diagnosis not present

## 2024-03-22 DIAGNOSIS — Z794 Long term (current) use of insulin: Secondary | ICD-10-CM

## 2024-03-22 NOTE — Telephone Encounter (Signed)
 Requested Prescriptions  Pending Prescriptions Disp Refills   fluticasone (FLONASE) 50 MCG/ACT nasal spray [Pharmacy Med Name: FLUTICASONE NASAL SP (120) RX] 16 g 0    Sig: SHAKE LIQUID AND USE 2 SPRAYS IN EACH NOSTRIL EVERY DAY     Ear, Nose, and Throat: Nasal Preparations - Corticosteroids Passed - 03/22/2024  9:46 AM      Passed - Valid encounter within last 12 months    Recent Outpatient Visits           4 weeks ago Annual physical exam   [facility], Kayleen Party, DO       Future Appointments             Today McDonald, Olive Better, DPM [facility] [place], [place]   In 1 week Elta Halter, MD [facility]   In 7 months Stoioff, Kizzie Perks, MD [facility] [place]

## 2024-03-22 NOTE — Progress Notes (Signed)
  Subjective:  Patient ID: Eric Andrews, male    DOB: 08-20-1951,  MRN: [id number]  Chief Complaint  Patient presents with   Nail Problem    Pt is her for [facility].    73 y.o. male presents with the above complaint. History confirmed with patient.  He presents today for diabetic foot examination says his A1c is well-controlled at 6.8%.  Sometimes gets an ingrown nail on the right second toe sometimes has been on the big toe as well.  Objective:  Physical Exam: warm, good capillary refill, nail exam dystrophic nails, trophic changes with decreased pedal hair growth thin shiny skin and discoloration with hemosiderosis, and +1 edema and paresthesias.  Absent protective sensation.    Assessment:   1. Nail dystrophy   2. Ingrowing left great toenail   3. Ingrowing right great toenail   4. Type 2 diabetes mellitus with diabetic polyneuropathy, with long-term current use of insulin (HCC)      Plan:  Patient was evaluated and treated and all questions answered.   Patient educated on diabetes. Discussed proper diabetic foot care and discussed risks and complications of disease. Educated patient in depth on reasons to return to the office immediately should he/she discover anything concerning or new on the feet. All questions answered. Discussed proper shoes as well.  Nails were debrided in length and thickness with a sharp nail nipper.  This is medically necessary due to his neuropathy and diabetes albeit controlled.  Return as needed for this.  We discussed the ingrowing nails as well and partial chronic matricectomy if these return.   Return if symptoms worsen or fail to improve.

## 2024-03-23 ENCOUNTER — Other Ambulatory Visit: Payer: Self-pay | Admitting: Family Medicine

## 2024-03-23 DIAGNOSIS — K219 Gastro-esophageal reflux disease without esophagitis: Secondary | ICD-10-CM

## 2024-03-23 NOTE — Telephone Encounter (Signed)
 Requested Prescriptions  Pending Prescriptions Disp Refills   omeprazole (PRILOSEC) 20 MG capsule [Pharmacy Med Name: OMEPRAZOLE 20MG  CAPSULES] 90 capsule 1    Sig: TAKE 1 CAPSULE BY MOUTH DAILY BEFORE BREAKFAST     Gastroenterology: Proton Pump Inhibitors Passed - 03/23/2024  5:23 PM      Passed - Valid encounter within last 12 months    Recent Outpatient Visits           1 month ago Annual physical exam   [place] [facility] [place], Kayleen Party, DO       Future Appointments             In 1 week Elta Halter, MD [facility] [place] [facility]   In 7 months Stoioff, Kizzie Perks, MD [facility] [place]

## 2024-03-23 NOTE — Telephone Encounter (Signed)
 Requested Prescriptions  Pending Prescriptions Disp Refills   losartan (COZAAR) 50 MG tablet [Pharmacy Med Name: LOSARTAN 50MG  TABLETS] 90 tablet 1    Sig: TAKE 1 TABLET(50 MG) BY MOUTH DAILY     Cardiovascular:  Angiotensin Receptor Blockers Passed - 03/23/2024 12:12 PM      Passed - Cr in normal range and within 180 days    Creat  Date Value Ref Range Status  02/14/2024 0.85 0.70 - 1.28 mg/dL Final         Passed - K in normal range and within 180 days    Potassium  Date Value Ref Range Status  02/14/2024 4.0 3.5 - 5.3 mmol/L Final         Passed - Patient is not pregnant      Passed - Last BP in normal range    BP Readings from Last 1 Encounters:  02/22/24 124/74         Passed - Valid encounter within last 6 months    Recent Outpatient Visits           1 month ago Annual physical exam   [place] [facility] [place], Kayleen Party, DO       Future Appointments             In 1 week Elta Halter, MD [facility] [place] [facility]   In 7 months Stoioff, Kizzie Perks, MD [facility] [place]

## 2024-03-28 ENCOUNTER — Ambulatory Visit: Payer: Self-pay

## 2024-03-28 ENCOUNTER — Other Ambulatory Visit: Payer: Self-pay

## 2024-03-28 DIAGNOSIS — E1169 Type 2 diabetes mellitus with other specified complication: Secondary | ICD-10-CM

## 2024-03-28 MED ORDER — INSULIN PEN NEEDLE 31G X 5 MM MISC: 3 refills | Status: DC

## 2024-03-28 NOTE — Telephone Encounter (Signed)
 Resent Rx to [facility] in [place].

## 2024-03-28 NOTE — Telephone Encounter (Signed)
 Please see information below pertaining to Insulin  Pen Needle prescription sent to wrong [facility]. Please resend to [facility] in [place], [place] on [address]    Copied from [employer] [id number]. Topic: Clinical - Prescription Issue >> Mar 28, 2024 10:47 AM [employer] wrote: Reason for CRM: Prescription is at the wrong pharmacy in [place]. Insulin  Pen Needle 31G X 5 MM MISC need to be sent to [facility] in [place], [place] #: [phone]  Preferred Pharmacy: [id number] - [place], [place] - [address] AT [facility] OF [address] [place] [address] [place] [address] Phone: [phone] Fax: [phone] Hours: Not open 24 hours

## 2024-03-30 ENCOUNTER — Ambulatory Visit: Payer: Medicare (Managed Care) | Admitting: Dermatology

## 2024-03-30 ENCOUNTER — Encounter: Payer: Self-pay | Admitting: Dermatology

## 2024-03-30 DIAGNOSIS — L299 Pruritus, unspecified: Secondary | ICD-10-CM | POA: Diagnosis not present

## 2024-03-30 DIAGNOSIS — Z1283 Encounter for screening for malignant neoplasm of skin: Secondary | ICD-10-CM

## 2024-03-30 DIAGNOSIS — L719 Rosacea, unspecified: Secondary | ICD-10-CM | POA: Diagnosis not present

## 2024-03-30 DIAGNOSIS — D1801 Hemangioma of skin and subcutaneous tissue: Secondary | ICD-10-CM

## 2024-03-30 DIAGNOSIS — L578 Other skin changes due to chronic exposure to nonionizing radiation: Secondary | ICD-10-CM | POA: Diagnosis not present

## 2024-03-30 DIAGNOSIS — I781 Nevus, non-neoplastic: Secondary | ICD-10-CM | POA: Diagnosis not present

## 2024-03-30 DIAGNOSIS — D229 Melanocytic nevi, unspecified: Secondary | ICD-10-CM

## 2024-03-30 DIAGNOSIS — L219 Seborrheic dermatitis, unspecified: Secondary | ICD-10-CM

## 2024-03-30 DIAGNOSIS — L820 Inflamed seborrheic keratosis: Secondary | ICD-10-CM

## 2024-03-30 DIAGNOSIS — I872 Venous insufficiency (chronic) (peripheral): Secondary | ICD-10-CM

## 2024-03-30 DIAGNOSIS — Z7189 Other specified counseling: Secondary | ICD-10-CM

## 2024-03-30 DIAGNOSIS — Z79899 Other long term (current) drug therapy: Secondary | ICD-10-CM

## 2024-03-30 DIAGNOSIS — L814 Other melanin hyperpigmentation: Secondary | ICD-10-CM | POA: Diagnosis not present

## 2024-03-30 DIAGNOSIS — L821 Other seborrheic keratosis: Secondary | ICD-10-CM

## 2024-03-30 DIAGNOSIS — W908XXA Exposure to other nonionizing radiation, initial encounter: Secondary | ICD-10-CM

## 2024-03-30 MED ORDER — KETOCONAZOLE 2 % EX SHAM: MEDICATED_SHAMPOO | CUTANEOUS | 11 refills | Status: AC

## 2024-03-30 MED ORDER — KETOCONAZOLE 2 % EX CREA: TOPICAL_CREAM | CUTANEOUS | 11 refills | Status: AC

## 2024-03-30 NOTE — Patient Instructions (Signed)

## 2024-03-30 NOTE — Progress Notes (Signed)
 Follow-Up Visit   Subjective  Eric Andrews is a 73 y.o. male who presents for the following: Skin Cancer Screening and Full Body Skin Exam hx of seb derm/folliculitis/pruritus at face and scalp, currently using ciclopirox  shampoo and ketoconazole  cream to affected areas as needed. Reports he has only shampooing once monthly.   Hx of isks  The patient presents for Total-Body Skin Exam (TBSE) for skin cancer screening and mole check. The patient has spots, moles and lesions to be evaluated, some may be new or changing and the patient may have concern these could be cancer.   The following portions of the chart were reviewed this encounter and updated as appropriate: medications, allergies, medical history  Review of Systems:  No other skin or systemic complaints except as noted in HPI or Assessment and Plan.  Objective  Well appearing patient in no apparent distress; mood and affect are within normal limits.  A full examination was performed including scalp, head, eyes, ears, nose, lips, neck, chest, axillae, abdomen, back, buttocks, bilateral upper extremities, bilateral lower extremities, hands, feet, fingers, toes, fingernails, and toenails. All findings within normal limits unless otherwise noted below.   Relevant physical exam findings are noted in the Assessment and Plan.  left forearm x 1 Erythematous stuck-on, waxy papule or plaque  Assessment & Plan   SKIN CANCER SCREENING PERFORMED TODAY.  ACTINIC DAMAGE - Chronic condition, secondary to cumulative UV/sun exposure - diffuse scaly erythematous macules with underlying dyspigmentation - Recommend daily broad spectrum sunscreen SPF 30+ to sun-exposed areas, reapply every 2 hours as needed.  - Staying in the shade or wearing long sleeves, sun glasses (UVA+UVB protection) and wide brim hats (4-inch brim around the entire circumference of the hat) are also recommended for sun protection.  - Call for new or changing  lesions.  LENTIGINES, SEBORRHEIC KERATOSES, HEMANGIOMAS - Benign normal skin lesions - Benign-appearing - Call for any changes  MELANOCYTIC NEVI - Tan-brown and/or pink-flesh-colored symmetric macules and papules - Benign appearing on exam today - Observation - Call clinic for new or changing moles - Recommend daily use of broad spectrum spf 30+ sunscreen to sun-exposed areas.    SEBORRHEIC DERMATITIS vs folliculitis with pruritus at face and scalp Exam: Pink patches with greasy scale at scalp and face  Chronic and persistent condition with duration or expected duration over one year. Condition is bothersome/symptomatic for patient. Currently flared. Seborrheic Dermatitis is a chronic persistent rash characterized by pinkness and scaling most commonly of the mid face but also can occur on the scalp (dandruff), ears; mid chest, mid back and groin.  It tends to be exacerbated by stress and cooler weather.  People who have neurologic disease may experience new onset or exacerbation of existing seborrheic dermatitis.  The condition is not curable but treatable and can be controlled.  Patient was only shampooing once monthly   Treatment Plan: Start ketoconazole  shampoo - apply 2 to 3 times per week, massage into scalp and leave in for 5 minutes before rinsing out   ROSACEA with telangectasias  Exam Mid face erythema with telangiectasias  Chronic and persistent condition with duration or expected duration over one year. Condition is symptomatic/ bothersome to patient. Not currently at goal. Rosacea is a chronic progressive skin condition usually affecting the face of adults, causing redness and/or acne bumps. It is treatable but not curable. It sometimes affects the eyes (ocular rosacea) as well. It may respond to topical and/or systemic medication and can flare with stress, sun  exposure, alcohol, exercise, topical steroids (including hydrocortisone /cortisone 10) and some foods.  Daily  application of broad spectrum spf 30+ sunscreen to face is recommended to reduce flares.  Patient denies grittiness of the eyes  Treatment Plan Counseling for BBL / IPL / Laser and Coordination of Care Discussed the treatment option of Broad Band Light (BBL) /Intense Pulsed Light (IPL)/ Laser for skin discoloration, including brown spots and redness.  Typically we recommend at least 1-3 treatment sessions about 5-8 weeks apart for best results.  Cannot have tanned skin when BBL performed, and regular use of sunscreen/photoprotection is advised after the procedure to help maintain results. The patient's condition may also require "maintenance treatments" in the future.  The fee for BBL / laser treatments is $350 per treatment session for the whole face.  A fee can be quoted for other parts of the body.  Insurance typically does not pay for BBL/laser treatments and therefore the fee is an out-of-pocket cost. Recommend prophylactic valtrex treatment. Once scheduled for procedure, will send Rx in prior to patient's appointment.  Patient declines laser treatment.  STASIS CHANGES AT LOWER EXTREMITIES  Exam: Erythematous, scaly patches involving the ankle and distal lower leg with associated lower leg edema. Chronic and persistent condition with duration or expected duration over one year. Condition is symptomatic/ bothersome to patient. Not currently at goal. Stasis in the legs causes chronic leg swelling, which may result in itchy or painful rashes, skin discoloration, skin texture changes, and sometimes ulceration.  Recommend daily graduated compression hose/stockings- easiest to put on first thing in morning, remove at bedtime.  Elevate legs as much as possible. Avoid salt/sodium rich foods. Treatment Plan: Recommend compression stockings Elevate legs as much as possible   SEBORRHEIC DERMATITIS   Related Medications hydrocortisone  2.5 % cream APPLY TOPICALLY TO THE AFFECTED AREA OF FACE AND EARS 3  DAYS A WEEK ON MONDAYS, WEDNESDAYS, FRIDAYS ketoconazole  (NIZORAL ) 2 % shampoo apply 2 to 3 times per week, massage into scalp and leave in for 5 minutes before rinsing out ketoconazole  (NIZORAL ) 2 % cream APPLY EXTERNALLY TO THE AFFECTED AREA OF FACE AND EARS 3 days a week on TUESDAY, THURSDAY, AND SATURDAY INFLAMED SEBORRHEIC KERATOSIS left forearm x 1 Symptomatic, irritating, patient would like treated. Destruction of lesion - left forearm x 1 Complexity: simple   Destruction method: cryotherapy   Informed consent: discussed and consent obtained   Timeout:  patient name, date of birth, surgical site, and procedure verified Lesion destroyed using liquid nitrogen: Yes   Region frozen until ice ball extended beyond lesion: Yes   Outcome: patient tolerated procedure well with no complications   Post-procedure details: wound care instructions given   Return in about 1 year (around 03/30/2025) for TBSE.  IRandee Busing, CMA, am acting as scribe for Celine Collard, MD.   Documentation: I have reviewed the above documentation for accuracy and completeness, and I agree with the above.  Celine Collard, MD

## 2024-04-18 ENCOUNTER — Other Ambulatory Visit: Payer: Self-pay | Admitting: Family Medicine

## 2024-04-18 DIAGNOSIS — E1169 Type 2 diabetes mellitus with other specified complication: Secondary | ICD-10-CM

## 2024-04-20 NOTE — Telephone Encounter (Signed)
 Too soon for refill, refilled 01/03/24 for 90 and 3 refills.  Requested Prescriptions  Pending Prescriptions Disp Refills   atorvastatin  (LIPITOR) 20 MG tablet [Pharmacy Med Name: ATORVASTATIN  20MG  TABLETS] 90 tablet 3    Sig: TAKE 1 TABLET(20 MG) BY MOUTH DAILY     Cardiovascular:  Antilipid - Statins Failed - 04/20/2024  8:49 AM      Failed - Lipid Panel in normal range within the last 12 months    Cholesterol  Date Value Ref Range Status  02/14/2024 116 <200 mg/dL Final   LDL Cholesterol (Calc)  Date Value Ref Range Status  02/14/2024 56 mg/dL (calc) Final    Comment:    Reference range: <100 . Desirable range <100 mg/dL for primary prevention;   <70 mg/dL for patients with CHD or diabetic patients  with > or = 2 CHD risk factors. Aaron Aas LDL-C is now calculated using the Martin-Hopkins  calculation, which is a validated novel method providing  better accuracy than the Friedewald equation in the  estimation of LDL-C.  Melinda Sprawls et al. Erroll Heard. 1610;960(45): 2061-2068  (http://education.[employer].com/faq/FAQ164)    HDL  Date Value Ref Range Status  02/14/2024 35 (L) > OR = 40 mg/dL Final   Triglycerides  Date Value Ref Range Status  02/14/2024 177 (H) <150 mg/dL Final         Passed - Patient is not pregnant      Passed - Valid encounter within last 12 months    Recent Outpatient Visits           1 month ago Annual physical exam   [place] [facility] Raina Bunting, DO       Future Appointments             In 6 months Stoioff, Kizzie Perks, MD [facility]   In 11 months Elta Halter, MD [facility] [place] [facility]

## 2024-05-17 ENCOUNTER — Other Ambulatory Visit: Payer: Self-pay | Admitting: Family Medicine

## 2024-05-17 DIAGNOSIS — M51369 Other intervertebral disc degeneration, lumbar region without mention of lumbar back pain or lower extremity pain: Secondary | ICD-10-CM

## 2024-05-18 ENCOUNTER — Other Ambulatory Visit: Payer: Self-pay | Admitting: Family Medicine

## 2024-05-18 DIAGNOSIS — Z794 Long term (current) use of insulin: Secondary | ICD-10-CM

## 2024-05-18 DIAGNOSIS — E1169 Type 2 diabetes mellitus with other specified complication: Secondary | ICD-10-CM

## 2024-05-18 NOTE — Telephone Encounter (Signed)
 Requested Prescriptions  Pending Prescriptions Disp Refills   celecoxib  (CELEBREX ) 200 MG capsule [Pharmacy Med Name: CELECOXIB  200MG  CAPSULES] 180 capsule 1    Sig: TAKE 1 CAPSULE(200 MG) BY MOUTH TWICE DAILY     Analgesics:  COX2 Inhibitors Failed - 05/18/2024  1:40 PM      Failed - Manual Review: Labs are only required if the patient has taken medication for more than 8 weeks.      Failed - AST in normal range and within 360 days    AST  Date Value Ref Range Status  02/14/2024 37 (H) 10 - 35 U/L Final         Failed - ALT in normal range and within 360 days    ALT  Date Value Ref Range Status  02/14/2024 64 (H) 9 - 46 U/L Final         Passed - HGB in normal range and within 360 days    Hemoglobin  Date Value Ref Range Status  02/14/2024 16.0 13.2 - 17.1 g/dL Final  04/54/0981 19.1 13.0 - 17.7 g/dL Final         Passed - Cr in normal range and within 360 days    Creat  Date Value Ref Range Status  02/14/2024 0.85 0.70 - 1.28 mg/dL Final         Passed - HCT in normal range and within 360 days    HCT  Date Value Ref Range Status  02/14/2024 48.7 38.5 - 50.0 % Final   Hematocrit  Date Value Ref Range Status  05/19/2023 46.9 37.5 - 51.0 % Final         Passed - eGFR is 30 or above and within 360 days    GFR, Estimated  Date Value Ref Range Status  12/22/2022 >60 >60 mL/min Final    Comment:    (NOTE) Calculated using the CKD-EPI Creatinine Equation (2021)    eGFR  Date Value Ref Range Status  02/14/2024 93 > OR = 60 mL/min/1.32m2 Final  05/19/2023 80 >59 mL/min/1.73 Final         Passed - Patient is not pregnant      Passed - Valid encounter within last 12 months    Recent Outpatient Visits           2 months ago Annual physical exam   [place] [facility] [place], Kayleen Party, DO       Future Appointments             In 5 months Stoioff, Kizzie Perks, MD [facility] [place]   In 10 months Elta Halter, MD [facility] [place] [facility]

## 2024-05-19 NOTE — Telephone Encounter (Signed)
 Requested Prescriptions  Pending Prescriptions Disp Refills   pioglitazone  (ACTOS ) 15 MG tablet [Pharmacy Med Name: PIOGLITAZONE  15MG  TABLETS] 90 tablet 0    Sig: Take 1 tablet (15 mg total) by mouth daily.     Endocrinology:  Diabetes - Glitazones - pioglitazone  Passed - 05/19/2024  1:40 PM      Passed - HBA1C is between 0 and 7.9 and within 180 days    Hgb A1c MFr Bld  Date Value Ref Range Status  02/14/2024 6.8 (H) <5.7 % of total Hgb Final    Comment:    For someone without known diabetes, a hemoglobin A1c value of 6.5% or greater indicates that they may have  diabetes and this should be confirmed with a follow-up  test. . For someone with known diabetes, a value <7% indicates  that their diabetes is well controlled and a value  greater than or equal to 7% indicates suboptimal  control. A1c targets should be individualized based on  duration of diabetes, age, comorbid conditions, and  other considerations. . Currently, no consensus exists regarding use of hemoglobin A1c for diagnosis of diabetes for children. Temple Feeler - Valid encounter within last 6 months    Recent Outpatient Visits           2 months ago Annual physical exam   [place] [facility] [place], Kayleen Party, DO       Future Appointments             In 5 months Stoioff, Kizzie Perks, MD [facility] [place]   In 10 months Elta Halter, MD [facility] [place] [facility]

## 2024-05-21 ENCOUNTER — Other Ambulatory Visit: Payer: Self-pay | Admitting: Family Medicine

## 2024-05-21 DIAGNOSIS — J0110 Acute frontal sinusitis, unspecified: Secondary | ICD-10-CM

## 2024-05-23 NOTE — Telephone Encounter (Signed)
 Requested Prescriptions  Pending Prescriptions Disp Refills   fluticasone  (FLONASE ) 50 MCG/ACT nasal spray [Pharmacy Med Name: FLUTICASONE  NASAL SP (120) RX] 16 g 2    Sig: SHAKE LIQUID AND USE 2 SPRAYS IN EACH NOSTRIL EVERY DAY     Ear, Nose, and Throat: Nasal Preparations - Corticosteroids Passed - 05/23/2024  4:57 PM      Passed - Valid encounter within last 12 months    Recent Outpatient Visits           3 months ago Annual physical exam   [place] [facility] [place], Kayleen Party, DO       Future Appointments             In 5 months Stoioff, Kizzie Perks, MD [facility] [place]   In 10 months Elta Halter, MD [facility] [place] [facility]

## 2024-05-24 ENCOUNTER — Other Ambulatory Visit: Payer: Self-pay | Admitting: Family Medicine

## 2024-05-24 DIAGNOSIS — K580 Irritable bowel syndrome with diarrhea: Secondary | ICD-10-CM

## 2024-05-26 NOTE — Telephone Encounter (Signed)
 Requested Prescriptions  Pending Prescriptions Disp Refills   dicyclomine  (BENTYL ) 10 MG capsule [Pharmacy Med Name: DICYCLOMINE  10MG  CAPSULES] 60 capsule 1    Sig: TAKE 1 CAPSULE(10 MG) BY MOUTH FOUR TIMES DAILY BEFORE MEALS AND AT BEDTIME AS NEEDED     Gastroenterology:  Antispasmodic Agents Passed - 05/26/2024 10:56 AM      Passed - Valid encounter within last 12 months    Recent Outpatient Visits           3 months ago Annual physical exam   [place] [facility] [place], Kayleen Party, DO       Future Appointments             In 5 months Stoioff, Kizzie Perks, MD [facility] [place]   In 10 months Elta Halter, MD [facility] [place] [facility]

## 2024-05-31 DIAGNOSIS — G4733 Obstructive sleep apnea (adult) (pediatric): Secondary | ICD-10-CM | POA: Diagnosis not present

## 2024-05-31 DIAGNOSIS — R4189 Other symptoms and signs involving cognitive functions and awareness: Secondary | ICD-10-CM | POA: Diagnosis not present

## 2024-05-31 DIAGNOSIS — R200 Anesthesia of skin: Secondary | ICD-10-CM | POA: Diagnosis not present

## 2024-05-31 DIAGNOSIS — G479 Sleep disorder, unspecified: Secondary | ICD-10-CM | POA: Diagnosis not present

## 2024-05-31 DIAGNOSIS — M79661 Pain in right lower leg: Secondary | ICD-10-CM | POA: Diagnosis not present

## 2024-05-31 DIAGNOSIS — M542 Cervicalgia: Secondary | ICD-10-CM | POA: Diagnosis not present

## 2024-05-31 DIAGNOSIS — M79602 Pain in left arm: Secondary | ICD-10-CM | POA: Diagnosis not present

## 2024-05-31 DIAGNOSIS — Z1331 Encounter for screening for depression: Secondary | ICD-10-CM | POA: Diagnosis not present

## 2024-05-31 DIAGNOSIS — Z6841 Body Mass Index (BMI) 40.0 and over, adult: Secondary | ICD-10-CM | POA: Diagnosis not present

## 2024-05-31 DIAGNOSIS — M545 Low back pain, unspecified: Secondary | ICD-10-CM | POA: Diagnosis not present

## 2024-05-31 DIAGNOSIS — R2689 Other abnormalities of gait and mobility: Secondary | ICD-10-CM | POA: Diagnosis not present

## 2024-05-31 DIAGNOSIS — R2681 Unsteadiness on feet: Secondary | ICD-10-CM | POA: Diagnosis not present

## 2024-06-21 ENCOUNTER — Encounter: Payer: Self-pay | Admitting: Pulmonary Disease

## 2024-06-21 ENCOUNTER — Ambulatory Visit: Payer: Medicare (Managed Care) | Admitting: Pulmonary Disease

## 2024-06-21 VITALS — BP 136/80 | HR 77 | Temp 98.1°F | Ht 69.0 in | Wt 303.2 lb

## 2024-06-21 DIAGNOSIS — E1169 Type 2 diabetes mellitus with other specified complication: Secondary | ICD-10-CM

## 2024-06-21 DIAGNOSIS — Z794 Long term (current) use of insulin: Secondary | ICD-10-CM

## 2024-06-21 DIAGNOSIS — Z6841 Body Mass Index (BMI) 40.0 and over, adult: Secondary | ICD-10-CM | POA: Diagnosis not present

## 2024-06-21 DIAGNOSIS — G4733 Obstructive sleep apnea (adult) (pediatric): Secondary | ICD-10-CM

## 2024-06-21 NOTE — Progress Notes (Signed)
 Subjective:    Patient ID: Eric Andrews, male    DOB: [birth year], 73 y.o.   MRN: [id number]  Patient Care Team: Edman Marsa PARAS, DO as PCP - General (Family Medicine) Alana, Sharyle LABOR, RPH-CPP (Pharmacist)  Chief Complaint  Patient presents with   Follow-up    BACKGROUND/INTERVAL:Patient is a 73 year old lifelong never smoker with a history of obstructive sleep apnea and issues with dyspnea who follows for the same.  This is a scheduled visit.  He was last seen here on 29 October 2023 by Izetta Rouleau, NP.  Patient is maintained on an auto CPAP of 5 to 12 cm H2O.  HPI History of Present Illness Eric Andrews is a 73 year old male with obstructive sleep apnea who initially presented with a complaint of dyspnea which is no longer an issue.  He has obstructive sleep apnea, which is well-controlled with CPAP therapy. He is accustomed to using the CPAP machine after many years and reports no issues with its use. No issues with the CPAP machine are reported.  He used to experience dyspnea but currently describes his breathing as 'very well'.  No complaints in this regard.  He is on Ozempic  for diabetes management, which has effectively controlled his blood sugar levels. He has also lost weight since his last visit.  He experiences neuropathy throughout his body, particularly in his legs. Additionally, he has a sensitive stomach, which he describes as a lifelong issue.   He discusses experiencing anxiety, which leads to 'stress eating', making it difficult for him to adhere to a diet.  His compliance download from 22 May 2019 25 through 19 June 2024 shows 100% compliance.  He is set on an AirSense 11 AutoSet on 5 to 12 cm H2O. his residual AHI is 1.2 with the therapy.   DATA 07/15/2016 Sleep Study [facility]): AHI 13.5/h REM AHI being 46.5/h.  Lowest O2 saturation 82%. 10/16/2020 echocardiogram: LVEF 55 to 60% grade 1 DD, normal RV function.  No valvular  abnormalities. 10/29/2020 PFTs: FEV1 3.06 L or 88% predicted, FVC 3.85 L or 82% predicted, lung volumes normal except for ERV that it is 2% (consistent with obesity), diffusion capacity normal.  Review of Systems A 10 point review of systems was performed and it is as noted above otherwise negative.   Patient Active Problem List   Diagnosis Date Noted   Atherosclerosis of aorta (HCC) 01/03/2024   OSA on CPAP 10/29/2023   Abdominal pain, epigastric 08/18/2023   Colon cancer screening 03/22/2023   Adenomatous polyp of colon 03/22/2023   Osteoarthritis of glenohumeral joint, left 01/20/2023   Status post total replacement of left shoulder 01/20/2023   S/P TKR (total knee replacement) using cement, left 04/21/2022   Bilateral primary osteoarthritis of knee 12/17/2021   Gastroesophageal reflux disease without esophagitis 07/08/2021   Environmental and seasonal allergies 02/27/2021   Hyperlipidemia associated with type 2 diabetes mellitus (HCC) 02/27/2021   Morbid obesity with BMI of 45.0-49.9, adult (HCC) 06/26/2020   Major depressive disorder, recurrent, moderate (HCC) 06/25/2020   GAD (generalized anxiety disorder) 06/25/2020   Type 2 diabetes mellitus with other specified complication (HCC) 05/27/2020   Chronic pain of both knees 05/27/2020   Primary osteoarthritis involving multiple joints 05/27/2020   Cataract associated with type 2 diabetes mellitus (HCC) 05/27/2020   History of torn meniscus of left knee 05/27/2020   DDD (degenerative disc disease), lumbar 05/27/2020    Social History   Tobacco Use   Smoking status: Never  Smokeless tobacco: Never  Substance Use Topics   Alcohol use: Not Currently    Allergies  Allergen Reactions   Dust Mite Extract     Current Meds  Medication Sig   Ascorbic Acid  (VITAMIN C) 1000 MG tablet Take 1,000 mg by mouth daily.   aspirin  EC 81 MG tablet Take 81 mg by mouth daily. Swallow whole.   atorvastatin  (LIPITOR) 20 MG tablet Take 1  tablet (20 mg total) by mouth daily.   busPIRone  (BUSPAR ) 15 MG tablet Take 15 mg by mouth daily.   celecoxib  (CELEBREX ) 200 MG capsule TAKE 1 CAPSULE(200 MG) BY MOUTH TWICE DAILY   cetirizine  (ZYRTEC ) 10 MG tablet TAKE 1 TABLET(10 MG) BY MOUTH AT BEDTIME   chlorthalidone  (HYGROTON ) 25 MG tablet TAKE 1 TABLET(25 MG) BY MOUTH DAILY   cholecalciferol  (VITAMIN D3) 25 MCG (1000 UNIT) tablet Take 1,000 Units by mouth in the morning and at bedtime.   dicyclomine  (BENTYL ) 10 MG capsule TAKE 1 CAPSULE(10 MG) BY MOUTH FOUR TIMES DAILY BEFORE MEALS AND AT BEDTIME AS NEEDED   FARXIGA  10 MG TABS tablet Take 1 tablet (10 mg total) by mouth daily before breakfast.   fenofibrate  micronized (LOFIBRA) 134 MG capsule TAKE 1 CAPSULE(134 MG) BY MOUTH DAILY BEFORE BREAKFAST   finasteride  (PROSCAR ) 5 MG tablet TAKE 1 TABLET(5 MG) BY MOUTH EVERY EVENING   fluticasone  (FLONASE ) 50 MCG/ACT nasal spray SHAKE LIQUID AND USE 2 SPRAYS IN EACH NOSTRIL EVERY DAY   gabapentin  (NEURONTIN ) 600 MG tablet Take 600 mg by mouth in the morning, at noon, in the evening, and at bedtime.   hydrocortisone  2.5 % cream APPLY TOPICALLY TO THE AFFECTED AREA OF FACE AND EARS 3 DAYS A WEEK ON MONDAYS, WEDNESDAYS, FRIDAYS   insulin  degludec (TRESIBA  FLEXTOUCH) 100 UNIT/ML FlexTouch Pen Inject 68 Units into the skin at bedtime. (Patient taking differently: Inject 50 Units into the skin every evening.)   Insulin  Pen Needle 31G X 5 MM MISC Use with insulin  to inject into skin 3 times daily as directed   ketoconazole  (NIZORAL ) 2 % cream APPLY EXTERNALLY TO THE AFFECTED AREA OF FACE AND EARS 3 days a week on TUESDAY, THURSDAY, AND SATURDAY   ketoconazole  (NIZORAL ) 2 % shampoo apply 2 to 3 times per week, massage into scalp and leave in for 5 minutes before rinsing out   losartan  (COZAAR ) 50 MG tablet TAKE 1 TABLET(50 MG) BY MOUTH DAILY   metFORMIN  (GLUCOPHAGE -XR) 500 MG 24 hr tablet Take 1 tablet (500 mg total) by mouth 2 (two) times daily with a meal.    Multiple Vitamin (MULTIVITAMIN) capsule Take 1 capsule by mouth daily.   NOVOLOG  FLEXPEN 100 UNIT/ML FlexPen Inject 12-14 Units into the skin 2 (two) times daily. Sliding scale   Omega-3 Fatty Acids (FISH OIL) 1200 MG CAPS Take 1,200 mg by mouth in the morning and at bedtime.   omeprazole  (PRILOSEC) 20 MG capsule TAKE 1 CAPSULE BY MOUTH DAILY BEFORE BREAKFAST   OZEMPIC , 1 MG/DOSE, 4 MG/3ML SOPN Inject 1 mg into the skin once a week.   pioglitazone  (ACTOS ) 15 MG tablet Take 1 tablet (15 mg total) by mouth daily.   pregabalin (LYRICA) 50 MG capsule Take 50 mg by mouth in the morning, at noon, in the evening, and at bedtime.   QUEtiapine (SEROQUEL) 25 MG tablet Take 25 mg by mouth at bedtime.   tamsulosin  (FLOMAX ) 0.4 MG CAPS capsule TAKE 1 CAPSULE BY MOUTH EVERY NIGHT AT BEDTIME FOR URINE RETENTION   traMADol  (  ULTRAM ) 50 MG tablet Take 50 mg by mouth 3 (three) times daily as needed.   traZODone  (DESYREL ) 100 MG tablet Take 1.5 tablets (150 mg total) by mouth at bedtime.   venlafaxine  XR (EFFEXOR -XR) 150 MG 24 hr capsule TAKE 1 CAPSULE(150 MG) BY MOUTH DAILY WITH BREAKFAST    Immunization History  Administered Date(s) Administered   Fluad Quad(high Dose 65+) 10/10/2020, 11/06/2021, 09/28/2022   Fluad Trivalent(High Dose 65+) 10/04/2023   Moderna Sars-Covid-2 Vaccination 02/09/2020, 03/13/2020   PFIZER SARS-COV-2 Pediatric Vaccination 5-1yrs 10/18/2020, 04/30/2021   PNEUMOCOCCAL CONJUGATE-20 11/06/2021   Tdap 01/16/2018   Zoster Recombinant(Shingrix) 05/29/2023   Zoster, Unspecified 07/30/2023        Objective:     BP 136/80 (BP Location: Left Arm, Patient Position: Sitting, Cuff Size: Normal)   Pulse 77   Temp 98.1 F (36.7 C) (Oral)   Ht 5' 9 (1.753 m)   Wt (!) 303 lb 3.2 oz (137.5 kg)   SpO2 95%   BMI 44.77 kg/m   SpO2: 95 %  GENERAL: Morbidly obese man, no acute distress.  Ambulates with assistance of a cane.  No conversational dyspnea. HEAD: Normocephalic,  atraumatic.  EYES: Pupils equal, round, reactive to light.  No scleral icterus.  MOUTH: Oral mucosa moist, no thrush. NECK: Supple. No thyromegaly. Trachea midline. No JVD.  No adenopathy. PULMONARY: Good air entry bilaterally.  No adventitious sounds. CARDIOVASCULAR: S1 and S2. Regular rate and rhythm.  ABDOMEN: Massive truncal obesity. MUSCULOSKELETAL: No joint deformity, no clubbing, no edema.  NEUROLOGIC: No focal deficit, no gait disturbance, speech is fluent. SKIN: Intact,warm,dry.  No rashes noted. PSYCH: Mood and behavior normal.        Assessment & Plan:     ICD-10-CM   1. OSA (obstructive sleep apnea)  G47.33     2. Type 2 diabetes mellitus with other specified complication, with long-term current use of insulin  (HCC)  E11.69    Z79.4     3. Morbid obesity with BMI of 40.0-44.9, adult (HCC)  E66.01    Z68.41      Discussion:  Obstructive Sleep Apnea Obstructive sleep apnea is well-controlled with CPAP therapy. He reports no issues with the CPAP machine and is accustomed to its use. CPAP download data is satisfactory. - Continue CPAP therapy. - Schedule follow-up with Dr. Jess for CPAP management and machine check.  Diabetes Mellitus Diabetes mellitus is well-controlled with Ozempic . Blood sugar levels are normal, and weight loss has been observed since the last visit, which may aid in managing obstructive sleep apnea. - Continue Ozempic  therapy. - Continue follow-up by Dr. Edman.   Advised if symptoms do not improve or worsen, to please contact office for sooner follow up or seek emergency care.    I spent 30 minutes of dedicated to the care of this patient on the date of this encounter to include pre-visit review of records, face-to-face time with the patient discussing conditions above, post visit ordering of testing, clinical documentation with the electronic health record, making appropriate referrals as documented, and communicating necessary findings  to members of the patients care team.   C. Leita Sanders, MD Advanced Bronchoscopy PCCM [facility]    *This note was generated using voice recognition software/Dragon and/or AI transcription program.  Despite best efforts to proofread, errors can occur which can change the meaning. Any transcriptional errors that result from this process are unintentional and may not be fully corrected at the time of dictation.

## 2024-06-21 NOTE — Patient Instructions (Signed)
 VISIT SUMMARY:  During your visit, we discussed your obstructive sleep apnea, diabetes management, and other health concerns. Your sleep apnea is well-controlled with your CPAP machine, and your diabetes is well-managed with Ozempic , with noted weight loss since your last visit. We also talked about your neuropathy, sensitive stomach, and anxiety-related stress eating.  YOUR PLAN:  -OBSTRUCTIVE SLEEP APNEA: Obstructive sleep apnea is a condition where your breathing stops and starts during sleep due to blocked airways. Your condition is well-controlled with CPAP therapy, and you should continue using your CPAP machine as usual. We will schedule a follow-up with Dr. Jess for CPAP management and to check your machine.  -DIABETES MELLITUS: Diabetes mellitus is a condition where your blood sugar levels are too high. Your diabetes is well-controlled with Ozempic , and you have lost weight since your last visit, which is beneficial. Continue taking Ozempic  as prescribed, and follow with your primary care physician as scheduled.  INSTRUCTIONS:  Please continue using your CPAP machine and taking Ozempic  as prescribed. Schedule a follow-up appointment with Dr. Jess for CPAP management and machine check.

## 2024-06-23 ENCOUNTER — Other Ambulatory Visit: Payer: Self-pay | Admitting: Pharmacist

## 2024-06-23 ENCOUNTER — Encounter: Payer: Self-pay | Admitting: Pharmacist

## 2024-06-23 DIAGNOSIS — Z794 Long term (current) use of insulin: Secondary | ICD-10-CM

## 2024-06-23 NOTE — Patient Instructions (Signed)
 As of 7/25, Ozempic  & NovoFine pen needles will no longer be automatically refilled from [employer] patient assistance program. To request refills after this date, program must receive a refill request form from office up to 30 days prior to refill being due.  Please monitor your supply of Ozempic  & NovoFine pen needles at home and when you have only a 1 month supply remaining, contact [facility] at [phone] to request she start refill process for you.  Thank you!  Sharyle Sia, PharmD, [facility] Clinical Pharmacist [facility] [phone]

## 2024-06-23 NOTE — Progress Notes (Signed)
   06/23/2024  Patient ID: Eric Andrews, male   DOB: 1951-12-04, 73 y.o.   MRN: [id number]  Patient enrolled in patient assistance program from [employer].  As of 7/25, Ozempic  and NovoFine pen needles will no longer be eligible for automatic refill from [employer]. To request refills after this date, program must receive a refill request form from office up to 30 days prior to refill being due.  Outreach to patient today and provide this update. Advise patient to monitor her supply of Ozempic  and NovoFine pen needles at home and when has only a 1 month supply remaining, to contact Eric Andrews: Phone: [phone] to request CPhT start refill form with PCP to be sent to program.  Eric Andrews, PharmD, Eric Andrews, CPP Clinical Pharmacist [facility] [phone]

## 2024-06-29 ENCOUNTER — Other Ambulatory Visit: Payer: Self-pay | Admitting: Family Medicine

## 2024-06-29 DIAGNOSIS — Z794 Long term (current) use of insulin: Secondary | ICD-10-CM

## 2024-06-29 MED ORDER — FARXIGA 10 MG PO TABS: 10.0000 mg | ORAL_TABLET | Freq: Every day | ORAL | 3 refills | Status: AC

## 2024-07-05 ENCOUNTER — Encounter: Payer: Self-pay | Admitting: Urology

## 2024-07-17 LAB — HM DIABETES EYE EXAM

## 2024-07-19 ENCOUNTER — Ambulatory Visit: Payer: Medicare (Managed Care) | Admitting: Family Medicine

## 2024-07-19 ENCOUNTER — Encounter: Payer: Self-pay | Admitting: Family Medicine

## 2024-07-19 VITALS — BP 110/72 | HR 84 | Ht 69.0 in | Wt 299.2 lb

## 2024-07-19 DIAGNOSIS — G8929 Other chronic pain: Secondary | ICD-10-CM

## 2024-07-19 DIAGNOSIS — M5442 Lumbago with sciatica, left side: Secondary | ICD-10-CM | POA: Diagnosis not present

## 2024-07-19 DIAGNOSIS — Z6841 Body Mass Index (BMI) 40.0 and over, adult: Secondary | ICD-10-CM

## 2024-07-19 DIAGNOSIS — K580 Irritable bowel syndrome with diarrhea: Secondary | ICD-10-CM

## 2024-07-19 DIAGNOSIS — M51362 Other intervertebral disc degeneration, lumbar region with discogenic back pain and lower extremity pain: Secondary | ICD-10-CM

## 2024-07-19 MED ORDER — DICYCLOMINE HCL 10 MG PO CAPS: 10.0000 mg | ORAL_CAPSULE | Freq: Three times a day (TID) | ORAL | 3 refills | Status: DC

## 2024-07-19 NOTE — Progress Notes (Signed)
 Subjective:    Patient ID: Eric Andrews, male    DOB: 1951/11/13, 73 y.o.   MRN: [id number]  Eric Andrews is a 73 y.o. male presenting on 07/19/2024 for Back Pain (Left side about 3 months )   HPI  Discussed the use of AI scribe software for clinical note transcription with the patient, who gave verbal consent to proceed.  History of Present Illness   Eric Andrews is a 73 year old male who presents with persistent left low back pain.  Left low back pain, Chronic - Persistent for 3-4 months, recently flared. - Located below the last rib on the left side, extending toward the spine - Described as a sensation of a 'soft ball' when lying on the left side, which dissipates slightly but remains sore and sensitive to touch - Pain is not worsened by physical activity such as yard work or by rest - Previous imaging with x-rays and CT scans performed to evaluate for kidney stones and other etiologies - No hematuria, urinary frequency, or dysuria   Osteoarthritis multiple joints / Lower extremity stiffness and pain - Significant stiffness and pain in the legs in the morning, making ambulation initially difficult - Stiffness improves with continued morning activity - Attributed to underlying arthritis and chronic joint degeneration  Followed by Neurologist - Currently taking pregabalin (Lyrica) four times daily, tramadol  50 mg three times daily, and celecoxib  (Celebrex ) for inflammation  IBS Abdominal Pain / cramping intermittently - Uses dicyclomine  as needed for abdominal pain and cramping, with good effect. He prefers to keep this medicine available in future PRN      02/22/2024    9:46 AM 08/17/2023    9:45 AM 07/30/2023    2:54 PM  Depression screen PHQ 2/9  Decreased Interest 0 1   Down, Depressed, Hopeless 1 0 1  PHQ - 2 Score 1 1 1   Altered sleeping 1 1   Tired, decreased energy 1 2 1   Feeling bad or failure about yourself  1 2   Trouble concentrating 0 2   Moving slowly or  fidgety/restless 1 0   Suicidal thoughts 0 0   PHQ-9 Score 5 8   Difficult doing work/chores Somewhat difficult Not difficult at all Not difficult at all       02/22/2024    9:46 AM 08/17/2023    9:46 AM 11/23/2022   11:46 AM 08/11/2022    3:22 PM  GAD 7 : Generalized Anxiety Score  Nervous, Anxious, on Edge 2 1 2 2   Control/stop worrying 2 1 2 3   Worry too much - different things 2 1 3 1   Trouble relaxing 2 1 2 1   Restless 0 0 0 1  Easily annoyed or irritable 0 1 1 1   Afraid - awful might happen 1 0 0 0  Total GAD 7 Score 9 5 10 9   Anxiety Difficulty Somewhat difficult  Somewhat difficult Not difficult at all    Social History   Tobacco Use   Smoking status: Never   Smokeless tobacco: Never  Vaping Use   Vaping status: Never Used  Substance Use Topics   Alcohol use: Not Currently   Drug use: Never    Review of Systems Per HPI unless specifically indicated above     Objective:    BP 110/72 (BP Location: Right Arm, Patient Position: Sitting, Cuff Size: Large)   Pulse 84   Ht 5' 9 (1.753 m)   Wt 299 lb 4 oz (135.7 kg)  SpO2 95%   BMI 44.19 kg/m   Wt Readings from Last 3 Encounters:  07/19/24 299 lb 4 oz (135.7 kg)  06/21/24 (!) 303 lb 3.2 oz (137.5 kg)  03/22/24 (!) 305 lb (138.3 kg)    Physical Exam Vitals and nursing note reviewed.  Constitutional:      General: He is not in acute distress.    Appearance: Normal appearance. He is well-developed. He is obese. He is not diaphoretic.     Comments: Well-appearing, comfortable, cooperative  HENT:     Head: Normocephalic and atraumatic.  Eyes:     General:        Right eye: No discharge.        Left eye: No discharge.     Conjunctiva/sclera: Conjunctivae normal.  Cardiovascular:     Rate and Rhythm: Normal rate.  Pulmonary:     Effort: Pulmonary effort is normal.  Musculoskeletal:     Comments: Back muscles with some hypertonicity left sided mid to low back  Skin:    General: Skin is warm and dry.      Findings: No erythema or rash.  Neurological:     Mental Status: He is alert and oriented to person, place, and time.  Psychiatric:        Mood and Affect: Mood normal.        Behavior: Behavior normal.        Thought Content: Thought content normal.     Comments: Well groomed, good eye contact, normal speech and thoughts     Results for orders placed or performed in visit on 02/14/24  TSH   Collection Time: 02/14/24  8:14 AM  Result Value Ref Range   TSH 2.12 0.40 - 4.50 mIU/L  PSA   Collection Time: 02/14/24  8:14 AM  Result Value Ref Range   PSA 0.13 < OR = 4.00 ng/mL  CBC with Differential/Platelet   Collection Time: 02/14/24  8:14 AM  Result Value Ref Range   WBC 5.6 3.8 - 10.8 Thousand/uL   RBC 5.72 4.20 - 5.80 Million/uL   Hemoglobin 16.0 13.2 - 17.1 g/dL   HCT 51.2 61.4 - 49.9 %   MCV 85.1 80.0 - 100.0 fL   MCH 28.0 27.0 - 33.0 pg   MCHC 32.9 32.0 - 36.0 g/dL   RDW 86.5 88.9 - 84.9 %   Platelets 149 140 - 400 Thousand/uL   MPV 11.9 7.5 - 12.5 fL   Neutro Abs 3,136 1,500 - 7,800 cells/uL   Absolute Lymphocytes 1,529 850 - 3,900 cells/uL   Absolute Monocytes 549 200 - 950 cells/uL   Eosinophils Absolute 319 15 - 500 cells/uL   Basophils Absolute 67 0 - 200 cells/uL   Neutrophils Relative % 56 %   Total Lymphocyte 27.3 %   Monocytes Relative 9.8 %   Eosinophils Relative 5.7 %   Basophils Relative 1.2 %  COMPLETE METABOLIC PANEL WITH GFR   Collection Time: 02/14/24  8:14 AM  Result Value Ref Range   Glucose, Bld 147 (H) 65 - 99 mg/dL   BUN 25 7 - 25 mg/dL   Creat 9.14 9.29 - 8.71 mg/dL   eGFR 93 > OR = 60 fO/fpw/8.26f7   BUN/Creatinine Ratio SEE NOTE: 6 - 22 (calc)   Sodium 142 135 - 146 mmol/L   Potassium 4.0 3.5 - 5.3 mmol/L   Chloride 101 98 - 110 mmol/L   CO2 34 (H) 20 - 32 mmol/L   Calcium  9.7 8.6 - 10.3  mg/dL   Total Protein 6.5 6.1 - 8.1 g/dL   Albumin 4.3 3.6 - 5.1 g/dL   Globulin 2.2 1.9 - 3.7 g/dL (calc)   AG Ratio 2.0 1.0 - 2.5 (calc)    Total Bilirubin 0.5 0.2 - 1.2 mg/dL   Alkaline phosphatase (APISO) 39 35 - 144 U/L   AST 37 (H) 10 - 35 U/L   ALT 64 (H) 9 - 46 U/L  Lipid panel   Collection Time: 02/14/24  8:14 AM  Result Value Ref Range   Cholesterol 116 <200 mg/dL   HDL 35 (L) > OR = 40 mg/dL   Triglycerides 822 (H) <150 mg/dL   LDL Cholesterol (Calc) 56 mg/dL (calc)   Total CHOL/HDL Ratio 3.3 <5.0 (calc)   Non-HDL Cholesterol (Calc) 81 <869 mg/dL (calc)  Hemoglobin J8r   Collection Time: 02/14/24  8:14 AM  Result Value Ref Range   Hgb A1c MFr Bld 6.8 (H) <5.7 % of total Hgb   Mean Plasma Glucose 148 mg/dL   eAG (mmol/L) 8.2 mmol/L      Assessment & Plan:   Problem List Items Addressed This Visit     DDD (degenerative disc disease), lumbar   Other Visit Diagnoses       Chronic left-sided low back pain with left-sided sciatica    -  Primary     Morbid obesity with BMI of 40.0-44.9, adult (HCC)         Irritable bowel syndrome with diarrhea       Relevant Medications   dicyclomine  (BENTYL ) 10 MG capsule        Chronic low back pain with spinal osteoarthritis Muscle hypertonicity spasm Pain likely muscular, not renal after discussion and evaluation of prior imaging X-rays and exam. Current medications appropriate. Imaging may be considered to rule out other causes if interested in future, declined today Followed by [facility] Neurology - Continue pregabalin, tramadol , and celecoxib . - Consider physical therapy or massage therapy if desired.  Arthritis of the hand and other sites Classic arthritis with morning stiffness, improved with movement. - Use muscle rubs and heating pads for morning stiffness. - Encourage regular movement to reduce stiffness.  IBS / Cramping / Intermittent abdominal pain Managed effectively with dicyclomine , used sparingly. - Continue dicyclomine  as needed. - Ensure future refills of dicyclomine  are available.  General Health Maintenance Discussed flu vaccination and  routine blood work. - Advise flu shot in September or October.    No orders of the defined types were placed in this encounter.   Meds ordered this encounter  Medications   dicyclomine  (BENTYL ) 10 MG capsule    Sig: Take 1 capsule (10 mg total) by mouth 3 (three) times daily before meals. As needed for abdominal pain cramping    Dispense:  60 capsule    Refill:  3    Patient does not need rx filled yet. Please keep on file for future refills.    Follow up plan: Return if symptoms worsen or fail to improve.   Marsa Officer, DO [facility] 07/19/2024, 11:03 AM

## 2024-07-19 NOTE — Patient Instructions (Addendum)
 Thank you for coming to the office today.  Keep current medications  I do believe the back pain is related to the back, and not the kidney  I did review the previous X-rays CT scans imaging. No new concerns about kidney  Follow-up in September   Please schedule a Follow-up Appointment to: Return if symptoms worsen or fail to improve.  If you have any other questions or concerns, please feel free to call the office or send a message through MyChart. You may also schedule an earlier appointment if necessary.  Additionally, you may be receiving a survey about your experience at our office within a few days to 1 week by e-mail or mail. We value your feedback.  Marsa Officer, DO [facility], [place]

## 2024-07-21 ENCOUNTER — Other Ambulatory Visit: Payer: Self-pay

## 2024-07-21 ENCOUNTER — Encounter: Payer: Self-pay | Admitting: Family Medicine

## 2024-07-21 DIAGNOSIS — E1169 Type 2 diabetes mellitus with other specified complication: Secondary | ICD-10-CM

## 2024-07-21 DIAGNOSIS — Z794 Long term (current) use of insulin: Secondary | ICD-10-CM

## 2024-07-31 MED ORDER — METFORMIN HCL ER 500 MG PO TB24: 500.0000 mg | ORAL_TABLET | Freq: Two times a day (BID) | ORAL | 1 refills | Status: DC

## 2024-08-04 ENCOUNTER — Ambulatory Visit: Payer: Medicare (Managed Care)

## 2024-08-09 ENCOUNTER — Other Ambulatory Visit: Payer: Self-pay | Admitting: Family Medicine

## 2024-08-09 DIAGNOSIS — F331 Major depressive disorder, recurrent, moderate: Secondary | ICD-10-CM

## 2024-08-09 NOTE — Telephone Encounter (Signed)
 Lipid panel in date  Requested Prescriptions  Pending Prescriptions Disp Refills   venlafaxine  XR (EFFEXOR -XR) 150 MG 24 hr capsule [Pharmacy Med Name: VENLAFAXINE  ER 150MG  CAPSULES] 90 capsule 3    Sig: TAKE 1 CAPSULE(150 MG) BY MOUTH DAILY WITH BREAKFAST     Psychiatry: Antidepressants - SNRI - desvenlafaxine & venlafaxine  Failed - 08/09/2024  3:21 PM      Failed - Lipid Panel in normal range within the last 12 months    Cholesterol  Date Value Ref Range Status  02/14/2024 116 <200 mg/dL Final   LDL Cholesterol (Calc)  Date Value Ref Range Status  02/14/2024 56 mg/dL (calc) Final    Comment:    Reference range: <100 . Desirable range <100 mg/dL for primary prevention;   <70 mg/dL for patients with CHD or diabetic patients  with > or = 2 CHD risk factors. SABRA LDL-C is now calculated using the Martin-Hopkins  calculation, which is a validated novel method providing  better accuracy than the Friedewald equation in the  estimation of LDL-C.  Gladis APPLETHWAITE et al. SANDREA. 7986;689(80): 2061-2068  (http://education.Quest[employer].com/faq/FAQ164)    HDL  Date Value Ref Range Status  02/14/2024 35 (L) > OR = 40 mg/dL Final   Triglycerides  Date Value Ref Range Status  02/14/2024 177 (H) <150 mg/dL Final         Passed - Cr in normal range and within 360 days    Creat  Date Value Ref Range Status  02/14/2024 0.85 0.70 - 1.28 mg/dL Final         Passed - Completed PHQ-2 or PHQ-9 in the last 360 days      Passed - Last BP in normal range    BP Readings from Last 1 Encounters:  07/19/24 110/72         Passed - Valid encounter within last 6 months    Recent Outpatient Visits           3 weeks ago Chronic left-sided low back pain with left-sided sciatica   [place] [facility], Marsa PARAS, DO   5 months ago Annual physical exam   [place] [facility] [place] Marsa PARAS, DO       Future Appointments             In  2 months Stoioff, Glendia BROCKS, MD [facility] [place]   In 7 months Hester Alm BROCKS, MD [facility]

## 2024-08-19 ENCOUNTER — Other Ambulatory Visit: Payer: Self-pay | Admitting: Family Medicine

## 2024-08-19 DIAGNOSIS — E1169 Type 2 diabetes mellitus with other specified complication: Secondary | ICD-10-CM

## 2024-08-21 NOTE — Telephone Encounter (Signed)
 Requested Prescriptions  Pending Prescriptions Disp Refills   pioglitazone  (ACTOS ) 15 MG tablet [Pharmacy Med Name: PIOGLITAZONE  15MG  TABLETS] 90 tablet 0    Sig: TAKE 1 TABLET(15 MG) BY MOUTH DAILY     Endocrinology:  Diabetes - Glitazones - pioglitazone  Failed - 08/21/2024  2:01 PM      Failed - HBA1C is between 0 and 7.9 and within 180 days    Hgb A1c MFr Bld  Date Value Ref Range Status  02/14/2024 6.8 (H) <5.7 % of total Hgb Final    Comment:    For someone without known diabetes, a hemoglobin A1c value of 6.5% or greater indicates that they may have  diabetes and this should be confirmed with a follow-up  test. . For someone with known diabetes, a value <7% indicates  that their diabetes is well controlled and a value  greater than or equal to 7% indicates suboptimal  control. A1c targets should be individualized based on  duration of diabetes, age, comorbid conditions, and  other considerations. . Currently, no consensus exists regarding use of hemoglobin A1c for diagnosis of diabetes for children. SABRA Amy - Valid encounter within last 6 months    Recent Outpatient Visits           1 month ago Chronic left-sided low back pain with left-sided sciatica   [place] [facility] [place] Marsa PARAS, DO   6 months ago Annual physical exam   [place] [facility] [place] Marsa PARAS, DO       Future Appointments             In 2 months Stoioff, Glendia BROCKS, MD [facility] [place]   In 7 months Hester Alm BROCKS, MD [facility] [place] [facility]

## 2024-08-24 ENCOUNTER — Encounter: Payer: Self-pay | Admitting: Family Medicine

## 2024-08-24 ENCOUNTER — Ambulatory Visit (INDEPENDENT_AMBULATORY_CARE_PROVIDER_SITE_OTHER): Payer: Medicare (Managed Care) | Admitting: Family Medicine

## 2024-08-24 ENCOUNTER — Other Ambulatory Visit: Payer: Self-pay | Admitting: Family Medicine

## 2024-08-24 VITALS — BP 124/70 | HR 83 | Ht 69.0 in | Wt 299.4 lb

## 2024-08-24 DIAGNOSIS — F331 Major depressive disorder, recurrent, moderate: Secondary | ICD-10-CM | POA: Diagnosis not present

## 2024-08-24 DIAGNOSIS — Z0000 Encounter for general adult medical examination without abnormal findings: Secondary | ICD-10-CM

## 2024-08-24 DIAGNOSIS — E1169 Type 2 diabetes mellitus with other specified complication: Secondary | ICD-10-CM

## 2024-08-24 DIAGNOSIS — Z23 Encounter for immunization: Secondary | ICD-10-CM | POA: Diagnosis not present

## 2024-08-24 DIAGNOSIS — Z6841 Body Mass Index (BMI) 40.0 and over, adult: Secondary | ICD-10-CM

## 2024-08-24 DIAGNOSIS — N401 Enlarged prostate with lower urinary tract symptoms: Secondary | ICD-10-CM

## 2024-08-24 DIAGNOSIS — Z794 Long term (current) use of insulin: Secondary | ICD-10-CM

## 2024-08-24 DIAGNOSIS — I10 Essential (primary) hypertension: Secondary | ICD-10-CM

## 2024-08-24 DIAGNOSIS — Z7985 Long-term (current) use of injectable non-insulin antidiabetic drugs: Secondary | ICD-10-CM | POA: Diagnosis not present

## 2024-08-24 LAB — POCT GLYCOSYLATED HEMOGLOBIN (HGB A1C): Hemoglobin A1C: 7.1 % — AB (ref 4.0–5.6)

## 2024-08-24 MED ORDER — METFORMIN HCL ER 500 MG PO TB24: 1000.0000 mg | ORAL_TABLET | Freq: Two times a day (BID) | ORAL | 2 refills | Status: AC

## 2024-08-24 NOTE — Progress Notes (Signed)
 Subjective:    Patient ID: Eric Andrews, male    DOB: 02/22/1951, 73 y.o.   MRN: [id number]  Eric Andrews is a 73 y.o. male presenting on 08/24/2024 for Medical Management of Chronic Issues   HPI  Discussed the use of AI scribe software for clinical note transcription with the patient, who gave verbal consent to proceed.  History of Present Illness   Eric Andrews is a 73 year old male with diabetes who presents for follow-up on blood sugar management.     Major Depression, recurrent moderate Generalized Anxiety Disorder Continues on Buspar  5mg  BID, feels less effective On Venlafaxine  150mg  daily   CHRONIC DM, Type 2 Morbid Obesity  - Slight increase in blood glucose levels, with a recent measurement of 7.1 compared to 6.8 previously - Issue with Metformin  dose, refilled Metformin  XR 500mg  at twice a day instead of 2 pills twice a day, the quantity was correct but instructions were not correct on recent refill. He has been on half dose. - Decreased appetite with current Ozempic  dose of 1 mg weekly  - Adjusts Novolog  intake based on morning glucose, omitting it if glucose is close to 100 mg/dL  On PAP for medication assistance Meds: Humalog TID SSI with meals, Tresiba  50 units daily, Jardiance  25mg , Pioglitazone  15mg , Metformin  XR 500 TWICE A DAY (incorrect) - ON ozempic  1mg  weekly Reports  good compliance. Tolerating well w/o side-effects Currently on ARB Completed DM EYe exam DM Foot exam He cannot void today Denies hypoglycemia, polyuria, visual changes, numbness or tingling  Hypoglycemia episodes overnight on Thursday night he skips Novolog  due to initial dose of Ozempic  that week Skips Novolog  if sugar is 90-100, and takes >140+            08/24/2024   10:18 AM 02/22/2024    9:46 AM 08/17/2023    9:45 AM  Depression screen PHQ 2/9  Decreased Interest 1 0 1  Down, Depressed, Hopeless 1 1 0  PHQ - 2 Score 2 1 1   Altered sleeping 1 1 1   Tired, decreased energy 2 1 2    Change in appetite 2    Feeling bad or failure about yourself  1 1 2   Trouble concentrating 1 0 2  Moving slowly or fidgety/restless 2 1 0  Suicidal thoughts 0 0 0  PHQ-9 Score 11 5 8   Difficult doing work/chores Somewhat difficult Somewhat difficult Not difficult at all       08/24/2024   10:18 AM 02/22/2024    9:46 AM 08/17/2023    9:46 AM 11/23/2022   11:46 AM  GAD 7 : Generalized Anxiety Score  Nervous, Anxious, on Edge 2 2 1 2   Control/stop worrying 2 2 1 2   Worry too much - different things 2 2 1 3   Trouble relaxing 1 2 1 2   Restless 0 0 0 0  Easily annoyed or irritable 1 0 1 1  Afraid - awful might happen 0 1 0 0  Total GAD 7 Score 8 9 5 10   Anxiety Difficulty Not difficult at all Somewhat difficult  Somewhat difficult    Social History   Tobacco Use   Smoking status: Never   Smokeless tobacco: Never  Vaping Use   Vaping status: Never Used  Substance Use Topics   Alcohol use: Not Currently   Drug use: Never    Review of Systems Per HPI unless specifically indicated above     Objective:    BP 124/70 (BP Location: Right Arm,  Patient Position: Sitting, Cuff Size: Large)   Pulse 83   Ht 5' 9 (1.753 m)   Wt 299 lb 6 oz (135.8 kg)   SpO2 94%   BMI 44.21 kg/m   Wt Readings from Last 3 Encounters:  08/24/24 299 lb 6 oz (135.8 kg)  07/19/24 299 lb 4 oz (135.7 kg)  06/21/24 (!) 303 lb 3.2 oz (137.5 kg)    Physical Exam Vitals and nursing note reviewed.  Constitutional:      General: He is not in acute distress.    Appearance: Normal appearance. He is well-developed. He is not diaphoretic.     Comments: Well-appearing, comfortable, cooperative  HENT:     Head: Normocephalic and atraumatic.  Eyes:     General:        Right eye: No discharge.        Left eye: No discharge.     Conjunctiva/sclera: Conjunctivae normal.  Cardiovascular:     Rate and Rhythm: Normal rate.  Pulmonary:     Effort: Pulmonary effort is normal.  Skin:    General: Skin is  warm and dry.     Findings: No erythema or rash.  Neurological:     Mental Status: He is alert and oriented to person, place, and time.  Psychiatric:        Mood and Affect: Mood normal.        Behavior: Behavior normal.        Thought Content: Thought content normal.     Comments: Well groomed, good eye contact, normal speech and thoughts     Results for orders placed or performed in visit on 08/24/24  POCT HgB A1C   Collection Time: 08/24/24 10:19 AM  Result Value Ref Range   Hemoglobin A1C 7.1 (A) 4.0 - 5.6 %   HbA1c POC (<> result, manual entry)     HbA1c, POC (prediabetic range)     HbA1c, POC (controlled diabetic range)        Assessment & Plan:   Problem List Items Addressed This Visit     Major depressive disorder, recurrent, moderate (HCC)   Type 2 diabetes mellitus with other specified complication (HCC) - Primary   Relevant Medications   metFORMIN  (GLUCOPHAGE -XR) 500 MG 24 hr tablet   Other Relevant Orders   POCT HgB A1C (Completed)   Other Visit Diagnoses       Flu vaccine need       Relevant Orders   Flu vaccine HIGH DOSE PF(Fluzone Trivalent) (Completed)     Long-term current use of injectable noninsulin antidiabetic medication           Major Depression recurrent Continue current therapy  Type 2 diabetes mellitus Type 2 diabetes with HbA1c increase due to metformin  dosing error. On refill was given 2 pills per day instead of 4 pills per day Self-adjusted insulin  to prevent nocturnal hypoglycemia. Current regimen includes metformin , Tresiba , Ozempic , and Novolog .  Plan to optimize regimen to reduce polypharmacy and manage glucose levels. And avoid hypoglycemia  - Resend metformin  XR prescription: 500 mg, two tablets twice daily. 4 per day 360 total. It looks like prior order refilled at 360 pills but instructions were 1 pill twice a day incorrectly - Discontinue pioglitazone  15 mg due to issues with hypoglycemia. - Continue Farxiga  10mg  daily -  Continue Tresiba  at 50 units. - Continue Ozempic  at 1 mg weekly, consider increasing to 2 mg if needed. He is on PAP and we would need to apply for  dose adjustment if interested - Continue Novolog  per sliding scale, omit if fasting glucose <105 mg/dL. - Plan blood and urine tests in March 2026.     [facility], next call 09/2024   Orders Placed This Encounter  Procedures   Flu vaccine HIGH DOSE PF(Fluzone Trivalent)   POCT HgB A1C    Meds ordered this encounter  Medications   metFORMIN  (GLUCOPHAGE -XR) 500 MG 24 hr tablet    Sig: Take 2 tablets (1,000 mg total) by mouth 2 (two) times daily with a meal.    Dispense:  360 tablet    Refill:  2    Adjust dose back to 2 pills twice a day 4 per day    Follow up plan: Return for 6 month fasting lab > 1 week later Annual Physical.  Future labs ordered for 02/21/25   Marsa Officer, DO [facility] 08/24/2024, 10:29 AM

## 2024-08-24 NOTE — Patient Instructions (Addendum)
 Thank you for coming to the office today.  Keep Ozempici 1mg  weekly We can consider dose increase to 2mg  in future  Apologize about the Metformin  dosing issue New rx Metformin  500mg  x 2 = 1000mg  twice a day sent to pharmacy.  Discontinue Pioglitazone  15mg .  Recent Labs    02/14/24 0814 08/24/24 1019  HGBA1C 6.8* 7.1*   Goal is to reduce frequency of low blood sugars  DUE for FASTING BLOOD WORK (no food or drink after midnight before the lab appointment, only water or coffee without cream/sugar on the morning of)  SCHEDULE Lab Only visit in the morning at the clinic for lab draw in 6 MONTHS   - Make sure Lab Only appointment is at about 1 week before your next appointment, so that results will be available  For Lab Results, once available within 2-3 days of blood draw, you can can log in to MyChart online to view your results and a brief explanation. Also, we can discuss results at next follow-up visit.    Please schedule a Follow-up Appointment to: Return for 6 month fasting lab > 1 week later Annual Physical.  If you have any other questions or concerns, please feel free to call the office or send a message through MyChart. You may also schedule an earlier appointment if necessary.  Additionally, you may be receiving a survey about your experience at our office within a few days to 1 week by e-mail or mail. We value your feedback.  Marsa Officer, DO [facility], [place]

## 2024-08-25 ENCOUNTER — Other Ambulatory Visit: Payer: Self-pay | Admitting: Family Medicine

## 2024-08-25 DIAGNOSIS — J0110 Acute frontal sinusitis, unspecified: Secondary | ICD-10-CM

## 2024-08-25 NOTE — Telephone Encounter (Signed)
 Requested Prescriptions  Pending Prescriptions Disp Refills   fluticasone  (FLONASE ) 50 MCG/ACT nasal spray [Pharmacy Med Name: FLUTICASONE  NASAL SP (120) RX] 16 g 2    Sig: SHAKE LIQUID AND USE 2 SPRAYS IN EACH NOSTRIL EVERY DAY     Ear, Nose, and Throat: Nasal Preparations - Corticosteroids Passed - 08/25/2024  5:57 PM      Passed - Valid encounter within last 12 months    Recent Outpatient Visits           Yesterday Type 2 diabetes mellitus with other specified complication, with long-term current use of insulin  [facility])   [place] [facility] [place], Marsa PARAS, DO   1 month ago Chronic left-sided low back pain with left-sided sciatica   [place] [facility] [place] Marsa PARAS, DO   6 months ago Annual physical exam   [place] [facility] [place] Marsa PARAS, DO       Future Appointments             In 2 months Stoioff, Glendia BROCKS, MD [facility] [place]   In 7 months Hester Alm BROCKS, MD [facility] [place] [facility]

## 2024-09-03 DIAGNOSIS — G4733 Obstructive sleep apnea (adult) (pediatric): Secondary | ICD-10-CM | POA: Diagnosis not present

## 2024-09-06 ENCOUNTER — Telehealth: Payer: Self-pay

## 2024-09-06 NOTE — Telephone Encounter (Signed)
 Completed refill order forms for Ozempic  and Novofine needles and faxed to provider's office for review and signature.

## 2024-09-11 ENCOUNTER — Encounter: Payer: Self-pay | Admitting: Pharmacist

## 2024-09-11 ENCOUNTER — Other Ambulatory Visit: Payer: Self-pay | Admitting: Pharmacist

## 2024-09-11 DIAGNOSIS — E1169 Type 2 diabetes mellitus with other specified complication: Secondary | ICD-10-CM

## 2024-09-11 DIAGNOSIS — Z7985 Long-term (current) use of injectable non-insulin antidiabetic drugs: Secondary | ICD-10-CM

## 2024-09-11 NOTE — Progress Notes (Signed)
   09/11/2024  Patient ID: Eric Andrews, male   DOB: [birth year], 73 y.o.   MRN: [id number]  Patient enrolled in Ozempic  patient assistance program from [employer] through 12/06/2024  [employer] has announced that they will no longer offer Ozempic  to Medicare beneficiaries through their Patient Assistance Program in 2026.   Outreach to patient today and provide this update.   Based on reported income, patient does not meet criteria for Extra Help subsidy  Counsel patient that Medicare's Annual Enrollment Period for 2026 starts 09/20/2024. Encourage patient to review deductibles formulary coverage and copay amounts (including for Ozempic ) between plans. Also share with patient the contact information for the [facility] Information Program [facility] [place]) that can help with reviewing these Medicare plans Will also share this information with patient via MyChart message   Eric Andrews, PharmD, Eric Andrews, CPP Clinical Pharmacist [facility] [phone]

## 2024-09-11 NOTE — Patient Instructions (Signed)
 [employer], the maker of Ozempic , has announced that they will no longer offer Ozempic  to Medicare beneficiaries through their Patient Assistance Program in 2026.    This means that if you currently receive Ozempic  at no cost through the program, starting in January 2026 you'll need to use your insurance to continue on these medicines.      Choosing a Medicare Plan   With Medicare's Annual Enrollment Period starting on October 15th, we encourage you to review formulary coverage and copay amounts for Ozempic , as costs can vary widely between plans. Starting on Wednesday, October 1st you can compare your Medicare plan options by going to the Plan Finder tool at [employer].gov ( TeleconferenceOnDemand.fr ).    There are Medicare Specialists through the [place] [facility] Insurance Information Program ([place]) that can help you shop for Medicare plans.    [place] SHIIP toll free number: [phone] (Monday - Friday 8 am - 5 pm)   There are representatives located in each county:    [place] [facility] [address]     [place]  [phone] Call for an appointment with [facility]          Sharyle Sia, PharmD, [facility] [phone]

## 2024-09-13 ENCOUNTER — Telehealth: Payer: Self-pay

## 2024-09-13 NOTE — Telephone Encounter (Signed)
 PAP: Patient assistance application for Farxiga  through [employer] ([employer]) has been mailed to pt's home address on file. Provider portion of application will be faxed to provider's office.For renewal 2026.

## 2024-09-20 ENCOUNTER — Other Ambulatory Visit: Payer: Self-pay | Admitting: Family Medicine

## 2024-09-20 DIAGNOSIS — I10 Essential (primary) hypertension: Secondary | ICD-10-CM

## 2024-09-20 DIAGNOSIS — N401 Enlarged prostate with lower urinary tract symptoms: Secondary | ICD-10-CM

## 2024-09-21 ENCOUNTER — Other Ambulatory Visit: Payer: Self-pay | Admitting: Family Medicine

## 2024-09-21 DIAGNOSIS — K219 Gastro-esophageal reflux disease without esophagitis: Secondary | ICD-10-CM

## 2024-09-21 NOTE — Telephone Encounter (Signed)
 Requested Prescriptions  Pending Prescriptions Disp Refills   chlorthalidone  (HYGROTON ) 25 MG tablet [Pharmacy Med Name: CHLORTHALIDONE  25MG  TABLETS] 90 tablet 0    Sig: TAKE 1 TABLET(25 MG) BY MOUTH DAILY     Cardiovascular: Diuretics - Thiazide Failed - 09/21/2024  4:26 PM      Failed - Cr in normal range and within 180 days    Creat  Date Value Ref Range Status  02/14/2024 0.85 0.70 - 1.28 mg/dL Final         Failed - K in normal range and within 180 days    Potassium  Date Value Ref Range Status  02/14/2024 4.0 3.5 - 5.3 mmol/L Final         Failed - Na in normal range and within 180 days    Sodium  Date Value Ref Range Status  02/14/2024 142 135 - 146 mmol/L Final  05/19/2023 141 134 - 144 mmol/L Final         Passed - Last BP in normal range    BP Readings from Last 1 Encounters:  08/24/24 124/70         Passed - Valid encounter within last 6 months    Recent Outpatient Visits           4 weeks ago Type 2 diabetes mellitus with other specified complication, with long-term current use of insulin  [facility])   [facility] [place] Mars[facility] PARAS, DO   2 months ago Chronic left-sided low back pain with left-sided sciatica   [place] [facility] [place] Marsa PARAS, DO   7 months ago Annual physical exam   [place] [facility] Marsa PARAS, DO       Future Appointments             In 1 month Stoioff, Glendia BROCKS, MD [facility] [place]   In 6 months Hester Alm BROCKS, MD [place] [facility]             tamsulosin  (FLOMAX ) 0.4 MG CAPS capsule [Pharmacy Med Name: TAMSULOSIN  0.4MG  CAPSULES] 90 capsule 0    Sig: TAKE 1 CAPSULE BY MOUTH EVERY NIGHT AT BEDTIME FOR URINE RETENTION     Urology: Alpha-Adrenergic Blocker Passed - 09/21/2024  4:26 PM      Passed - PSA in normal range and within 360 days    PSA  Date Value Ref Range Status  02/14/2024 0.13 < OR = 4.00  ng/mL Final    Comment:    The total PSA value from this assay system is  standardized against the WHO standard. The test  result will be approximately 20% lower when compared  to the equimolar-standardized total PSA (Beckman  Coulter). Comparison of serial PSA results should be  interpreted with this fact in mind. . This test was performed using the Siemens  chemiluminescent method. Values obtained from  different assay methods cannot be used interchangeably. PSA levels, regardless of value, should not be interpreted as absolute evidence of the presence or absence of disease.          Passed - Last BP in normal range    BP Readings from Last 1 Encounters:  08/24/24 124/70         Passed - Valid encounter within last 12 months    Recent Outpatient Visits           4 weeks ago Type 2 diabetes mellitus with other specified complication, with  long-term current use of insulin  [facility])   [place] [facility] [place], Marsa PARAS, DO   2 months ago Chronic left-sided low back pain with left-sided sciatica   [place] [place] Marsa PARAS, DO   7 months ago Annual physical exam   [place] [facility] [place] Marsa PARAS, DO       Future Appointments             In 1 month Stoioff, Glendia BROCKS, MD [facility] [place]   In 6 months Hester Alm BROCKS, MD [facility] [place] [facility]

## 2024-09-22 NOTE — Telephone Encounter (Signed)
 Requested Prescriptions  Pending Prescriptions Disp Refills   omeprazole  (PRILOSEC) 20 MG capsule [Pharmacy Med Name: OMEPRAZOLE  20MG  CAPSULES] 90 capsule 1    Sig: TAKE 1 CAPSULE BY MOUTH DAILY BEFORE BREAKFAST     Gastroenterology: Proton Pump Inhibitors Passed - 09/22/2024  3:14 PM      Passed - Valid encounter within last 12 months    Recent Outpatient Visits           4 weeks ago Type 2 diabetes mellitus with other specified complication, with long-term current use of insulin  [facility])   [place] [facility] [place], Marsa PARAS, DO   2 months ago Chronic left-sided low back pain with left-sided sciatica   [place] [facility] [place] Marsa PARAS, DO   7 months ago Annual physical exam   [place] [facility] Marsa PARAS, DO       Future Appointments             In 1 month Stoioff, Glendia BROCKS, MD [facility] [place]   In 6 months Hester Alm BROCKS, MD [facility] [place] [facility]

## 2024-09-25 ENCOUNTER — Encounter: Payer: Self-pay | Admitting: Pharmacist

## 2024-09-25 ENCOUNTER — Other Ambulatory Visit: Payer: Medicare (Managed Care) | Admitting: Pharmacist

## 2024-09-25 DIAGNOSIS — Z794 Long term (current) use of insulin: Secondary | ICD-10-CM

## 2024-09-25 DIAGNOSIS — Z7985 Long-term (current) use of injectable non-insulin antidiabetic drugs: Secondary | ICD-10-CM

## 2024-09-25 MED ORDER — FREESTYLE LIBRE 3 PLUS SENSOR MISC: 12 refills | Status: AC

## 2024-09-25 NOTE — Progress Notes (Unsigned)
 09/25/2024 Name: Eric Andrews MRN: [id number] DOB: 08/28/1951  Chief Complaint  Patient presents with   Medication Management   Medication Assistance    Eric Andrews is a 73 y.o. year old male who presented for a telephone visit.   They were referred to the pharmacist by their PCP for assistance in managing medication access.      Subjective:   Care Team: Primary Care Provider: Edman Marsa PARAS, DO; Next Scheduled Visit: 02/27/2025 Urology: Twylla Glendia BROCKS, MD; Next Scheduled Visit: 11/01/2024 Neurology: Lane Arthea Locus, MD; Next Scheduled Visit: 10/23/2024 [facility] Physical Medicine and Rehabilitation Pulmonology: Tamea Dedra CROME, MD  Medication Access/Adherence  Current Pharmacy:  [facility] DR[facility] STORE [phone] [place] [place], [place] - [address] AT [facility] OF [address]  [place] [address] Phone: [phone] Fax: [phone]  [facility] #[id number] [facility], [place] - [address] AT [facility] OF [address] [facility] [place] [address] Phone: [phone] Fax: [phone]  [facility] - [place], [place] - [address] [place] [address] Phone: [phone] Fax: [phone]   Patient reports affordability concerns with their medications: No  Patient reports access/transportation concerns to their pharmacy: No  Patient reports adherence concerns with their medications:  No     Diabetes:   Current medications:  Tresiba  50 units daily in evening Novolog  12 units three times daily with meals.  Patient self-adjusts down if needed based on blood sugar  Metformin  ER 500 mg - 1 tablets twice daily - Patient has not yet adjusted dose back up Pioglitazone  15 mg once daily - Still taking as has not yet increased metformin  dose as planned Farxiga  10 mg daily  Ozempic  1 mg weekly   Current glucose readings: recalls recent morning fasting ranging 100-120  From review of chart, note at  office visit with PCP on 08/24/2024, provider advised patient: - Resent metformin  XR prescription: 500 mg, two tablets twice daily - Discontinue pioglitazone  15 mg   Today patient shares that he has not yet made planned changes for increasing metformin  ER dosing back to 4 tablets/day and discontinuing pioglitazone  as he was unable to pick up new metformin  ER prescription from pharmacy (pharmacy advised that it was too soon through insurance)   Patient denies hypoglycemic s/sx including dizziness, shakiness, sweating - Reports carries glucose tablets in case needed to treat low blood sugar  Statin: atorvastatin  20 mg daily   Current medication access support: Patient enrolled in assistance programs from [employer] for Ozempic , Novolog  and Tresiba  and from [employer] for Farxiga  through 12/06/2024 - [employer] has announced that they will no longer offer Ozempic  to Medicare beneficiaries through their Patient Assistance Program in 2026.  - Patient confirms that he received [employer]Me re-enrollment application in mail from [employer], has completed this and is planning to mail back to [facility] today   Objective:  Lab Results  Component Value Date   HGBA1C 7.1 (A) 08/24/2024    Lab Results  Component Value Date   CREATININE 0.85 02/14/2024   BUN 25 02/14/2024   NA 142 02/14/2024   K 4.0 02/14/2024   CL 101 02/14/2024   CO2 34 (H) 02/14/2024    Lab Results  Component Value Date   CHOL 116 02/14/2024   HDL 35 (L) 02/14/2024   LDLCALC 56 02/14/2024   TRIG 177 (H) 02/14/2024   CHOLHDL 3.3 02/14/2024  Medications Reviewed Today   Medications were not reviewed in this encounter       Assessment/Plan:   Review counseling that Medicare's Annual Enrollment Period for 2026 starts 09/20/2024. Encourage patient to review deductibles formulary coverage and copay amounts (including for Ozempic ) between plans. Also share with patient the contact information for the [facility]  Information Program [facility]) that can help with reviewing these Medicare plans  Confirms monitoring for dizziness or sedation with taking gabapentin , pregabalin, tramadol  and trazodone  as prescribed by Neurologist. Reports lays down when needed - Discussed with Neurologist and decreased gabapentin  to three times daily. Denies dizziness/drowsiness   Outreach to [employer] today on behalf of patient. [facility] confirms that new metformin  ER dose increase prescription can be filled today. - Follow up with patient to provide update   Diabetes: - Currently controlled - Have reviewed with patient signs of and how to manage hypoglycemia. Encouraged patient to consider carrying glucose tablets with him - Patient to increase metformin  ER  500 mg dosing with new prescription to 2 tablets twice daily and will discontinue pioglitazone  15 mg, as previously discussed with PCP - Recommend to check glucose and keep log of results, have this log to review at upcoming appointments, but to contact office sooner if needed for readings outside of established parameters or for symptoms - Patient to follow up with [employer]Me patient assistance as needed for refills of Farxiga  and with [employer] patient assistance as needed for refills of Novolog , Tresiba  and pen needles - Collaborating with [facility] and PCP to assist patient with re-enrollment for [employer]Me patient assistance for Farxiga  and with [employer] patient assistance for Novolog , Tresiba  and pen needles - Provide patient with counseling on continuous glucose monitoring. Patient interested. Send prescription for Freestyle Libre 3 Plus sensors to pharmacy for patient. Patient's cost is $0.  Patient plans to download and use Freestyle Libre app on smart phone to use phone in place of reader device Advise patient to review Freestyle Libre 3 setup videos from manufacturer website and reach out to clinical pharmacist, pharmacy or office with any questions prior  to starting use of this device. Information sent via MyChart Provide patient with phone number for [employer] Support at [phone]. Their customer support is available 7 days a week 8 am to 8 pm.   Follow Up Plan:  Clinical Pharmacist will follow up with patient by telephone on 10/25/2024 at 10:30 AM      Sharyle Sia, PharmD, JAQUELINE, CPP Clinical Pharmacist [facility] [phone]

## 2024-09-26 NOTE — Patient Instructions (Signed)
 Please copy and paste the following web address into your web browser for the [employer] 3 Videos:   [email or web address]   As we discussed, you will need to download the Freestyle Herlene App to your smart phone in order to use your phone in place of the reader device   Please remember that you will need to apply a new Freestyle Libre 3 Plus sensor every 15 days   Also, you can reach out to the [employer] Support at [phone]. Their customer support is available 7 days a week 8 am to 8 pm.   Please review the video above and call me, your provider or the pharmacy with any questions before starting to use this device.   Goals Addressed             This Visit's Progress    Pharmacy Goals       If you need to reach out to patient assistance programs regarding refills or to find out the status of your application, you can do so by calling:  [employer] at 1-4136978866 [employer] at [phone]  Our goal A1c is less than 7%. This corresponds with fasting sugars less than 130 and 2 hour after meal sugars less than 180. Please check your blood sugar and keep a log of the results  Please check your home blood pressure, keep a log of the results and bring this with you to your medical appointments.  Our goal bad cholesterol, or LDL, is less than 70 . This is why it is important to continue taking your atorvastatin .   Sharyle Sia, PharmD, [facility] Clinical Pharmacist [facility] [phone]

## 2024-09-27 ENCOUNTER — Telehealth: Payer: Self-pay

## 2024-09-27 NOTE — Telephone Encounter (Signed)
 Left message for patient to return call OK  to advise  patient has medication here in the office ready for pick up

## 2024-10-06 NOTE — Progress Notes (Signed)
 Kashmere Staffa                                          MRN: [id number]   10/06/2024   The VBCI Quality Team Specialist reviewed this patient medical record for the purposes of chart review for care gap closure. The following were reviewed: chart review for care gap closure-kidney health evaluation for diabetes:eGFR  and uACR.    VBCI Quality Team

## 2024-10-10 ENCOUNTER — Other Ambulatory Visit: Payer: Self-pay | Admitting: Family Medicine

## 2024-10-10 DIAGNOSIS — J3089 Other allergic rhinitis: Secondary | ICD-10-CM

## 2024-10-11 NOTE — Telephone Encounter (Signed)
 Requested Prescriptions  Pending Prescriptions Disp Refills   cetirizine  (ZYRTEC ) 10 MG tablet [Pharmacy Med Name: CETIRIZINE  10MG  TABLETS] 90 tablet 0    Sig: TAKE 1 TABLET(10 MG) BY MOUTH AT BEDTIME     Ear, Nose, and Throat:  Antihistamines 2 Passed - 10/11/2024 12:15 PM      Passed - Cr in normal range and within 360 days    Creat  Date Value Ref Range Status  02/14/2024 0.85 0.70 - 1.28 mg/dL Final         Passed - Valid encounter within last 12 months    Recent Outpatient Visits           1 month ago Type 2 diabetes mellitus with other specified complication, with long-term current use of insulin  [facility])   [place] [facility] [place], Marsa PARAS, DO   2 months ago Chronic left-sided low back pain with left-sided sciatica   [place] [facility] Edman Marsa PARAS, DO   7 months ago Annual physical exam   [place] [facility] Edman Marsa PARAS, DO       Future Appointments             In 3 weeks Stoioff, Glendia BROCKS, MD [facility] Urology [place]   In 5 months Hester Alm BROCKS, MD [facility] [place] [facility]

## 2024-10-12 ENCOUNTER — Other Ambulatory Visit: Payer: Self-pay | Admitting: Family Medicine

## 2024-10-12 DIAGNOSIS — I10 Essential (primary) hypertension: Secondary | ICD-10-CM

## 2024-10-12 DIAGNOSIS — K580 Irritable bowel syndrome with diarrhea: Secondary | ICD-10-CM

## 2024-10-13 NOTE — Telephone Encounter (Signed)
 Requested Prescriptions  Pending Prescriptions Disp Refills   dicyclomine  (BENTYL ) 10 MG capsule [Pharmacy Med Name: DICYCLOMINE  10MG  CAPSULES] 60 capsule 3    Sig: TAKE 1 CAPSULE(10 MG) BY MOUTH FOUR TIMES DAILY BEFORE MEALS AND AT BEDTIME AS NEEDED     Gastroenterology:  Antispasmodic Agents Passed - 10/13/2024  4:26 PM      Passed - Valid encounter within last 12 months    Recent Outpatient Visits           1 month ago Type 2 diabetes mellitus with other specified complication, with long-term current use of insulin  [facility])   [place] [facility] [place], Marsa PARAS, DO   2 months ago Chronic left-sided low back pain with left-sided sciatica   [place] [facility] Edman Marsa PARAS, DO   7 months ago Annual physical exam   [facility] Edman Marsa PARAS, DO       Future Appointments             In 2 weeks Stoioff, Glendia BROCKS, MD [facility]rology [place]   In 5 months Hester Alm BROCKS, MD [facility] [place] [facility]

## 2024-10-13 NOTE — Telephone Encounter (Signed)
 PAP: Application for Marcelline Deist has been submitted to [employer] (AZ[employer]Me), via fax

## 2024-10-13 NOTE — Telephone Encounter (Signed)
 Received provider portion PAP application [employer]ME Farxiga .

## 2024-10-13 NOTE — Telephone Encounter (Signed)
 Requested Prescriptions  Pending Prescriptions Disp Refills   losartan  (COZAAR ) 50 MG tablet [Pharmacy Med Name: LOSARTAN  50MG  TABLETS] 90 tablet 0    Sig: TAKE 1 TABLET(50 MG) BY MOUTH DAILY     Cardiovascular:  Angiotensin Receptor Blockers Failed - 10/13/2024 12:48 PM      Failed - Cr in normal range and within 180 days    Creat  Date Value Ref Range Status  02/14/2024 0.85 0.70 - 1.28 mg/dL Final         Failed - K in normal range and within 180 days    Potassium  Date Value Ref Range Status  02/14/2024 4.0 3.5 - 5.3 mmol/L Final         Passed - Patient is not pregnant      Passed - Last BP in normal range    BP Readings from Last 1 Encounters:  08/24/24 124/70         Passed - Valid encounter within last 6 months    Recent Outpatient Visits           1 month ago Type 2 diabetes mellitus with other specified complication, with long-term current use of insulin  [facility])   [place] [facility] [place] Marsa PARAS, DO   2 months ago Chronic left-sided low back pain with left-sided sciatica   [facility] [place] Marsa PARAS, DO   7 months ago Annual physical exam   [place] [facility] Edman Marsa PARAS, DO       Future Appointments             In 2 weeks Stoioff, Glendia BROCKS, MD [facility] [place]   In 5 months Hester Alm BROCKS, MD [facility] [place] [facility]

## 2024-10-17 ENCOUNTER — Telehealth: Payer: Self-pay

## 2024-10-17 NOTE — Telephone Encounter (Signed)
 PAP: Patient assistance application for Novolog  and Tresiba  through [employer] has been mailed to pt's home address on file. Provider portion of application will be faxed to provider's office. Patient portion e-filed.

## 2024-10-18 NOTE — Telephone Encounter (Signed)
 PAP: Application for Novolog  and Tresiba  has been submitted to [employer], via fax

## 2024-10-22 ENCOUNTER — Other Ambulatory Visit: Payer: Self-pay | Admitting: Family Medicine

## 2024-10-22 DIAGNOSIS — N401 Enlarged prostate with lower urinary tract symptoms: Secondary | ICD-10-CM

## 2024-10-24 NOTE — Telephone Encounter (Signed)
 Requested Prescriptions  Pending Prescriptions Disp Refills   finasteride  (PROSCAR ) 5 MG tablet [Pharmacy Med Name: FINASTERIDE  5MG  TABLETS] 90 tablet 3    Sig: TAKE 1 TABLET(5 MG) BY MOUTH EVERY EVENING     Urology: 5-alpha Reductase Inhibitors Passed - 10/24/2024  3:05 PM      Passed - PSA in normal range and within 360 days    PSA  Date Value Ref Range Status  02/14/2024 0.13 < OR = 4.00 ng/mL Final    Comment:    The total PSA value from this assay system is  standardized against the WHO standard. The test  result will be approximately 20% lower when compared  to the equimolar-standardized total PSA (Beckman  Coulter). Comparison of serial PSA results should be  interpreted with this fact in mind. . This test was performed using the Siemens  chemiluminescent method. Values obtained from  different assay methods cannot be used interchangeably. PSA levels, regardless of value, should not be interpreted as absolute evidence of the presence or absence of disease.          Passed - Valid encounter within last 12 months    Recent Outpatient Visits           2 months ago Type 2 diabetes mellitus with other specified complication, with long-term current use of insulin  [facility])   [place] [facility] [place], Marsa PARAS, DO   3 months ago Chronic left-sided low back pain with left-sided sciatica   [place] [facility] Ed[place] Marsa PARAS, DO   8 months ago Annual physical exam   [place] Marsa PARAS, DO       Future Appointments             In 1 week Stoioff, Glendia BROCKS, MD [facility] Urology [employer]   In 5 months Hester Alm BROCKS, MD [facility] [place] [facility]

## 2024-10-25 ENCOUNTER — Other Ambulatory Visit: Payer: Medicare (Managed Care) | Admitting: Pharmacist

## 2024-10-25 DIAGNOSIS — Z7985 Long-term (current) use of injectable non-insulin antidiabetic drugs: Secondary | ICD-10-CM

## 2024-10-25 DIAGNOSIS — E1169 Type 2 diabetes mellitus with other specified complication: Secondary | ICD-10-CM

## 2024-10-25 NOTE — Progress Notes (Signed)
 10/25/2024 Name: Eric Andrews MRN: [id number] DOB: 1951/07/25  Chief Complaint  Patient presents with   Medication Assistance   Medication Management    Eric Andrews is a 73 y.o. year old male who presented for a telephone visit.   They were referred to the pharmacist by their PCP for assistance in managing medication access.      Subjective:   Care Team: Primary Care Provider: Edman Marsa PARAS, DO; Next Scheduled Visit: 02/27/2025 Urology: Twylla Glendia BROCKS, MD; Next Scheduled Visit: 11/01/2024 Neurology: Lane Arthea Locus, MD [facility] Pulmonology: Tamea Dedra CROME, MD  Medication Access/Adherence  Current Pharmacy:  [facility] [phone], [place] - [address] AT [facility] OF [address] [place] [address] Phone: [phone] Fax: [phone]  [facility] #[id number], [place] - [address] AT [facility] OF [address] [facility] [place] [address] Phone: [phone] Fax: [phone]  [facility] - [place], [place] - [address] [place] [address] Phone: [phone] Fax: [phone]   Patient reports affordability concerns with their medications: No  Patient reports access/transportation concerns to their pharmacy: No  Patient reports adherence concerns with their medications:  No     Today reports that he has enrolled in a Medicare prescription plan for 2026 with [employer] (previously called [employer]), but does not yet have card/recall name of specific plan today  Diabetes:   Current medications:  Tresiba  50 units daily in evening Novolog  10 units three times daily with meals.  Patient self-adjusted down if needed based on blood sugar  Metformin  ER 500 mg - 2 tablets (1000 mg) twice daily  Farxiga  10 mg daily  Ozempic  1 mg weekly   Confirms stopped pioglitazone  as planned ~3 weeks ago  Patient now using  Freestyle Libre 3 Plus Continuous Glucose. Connect today via Libreview and review results:   Date of Download: 10/25/24 % Time CGM is active: 98% Average Glucose: 156 mg/dL Glucose Management Indicator: 7.0%  Glucose Variability: 16.5% (goal <36%) Time in Goal:  - Time in range 70-180: 82% - Time above range: 18% - Time below range: 0%       Patient denies hypoglycemic s/sx including dizziness, shakiness, sweating - Reports carries glucose tablets in case needed to treat low blood sugar   Statin: atorvastatin  20 mg daily    Current medication access support: Patient enrolled in assistance programs from [employer] for Ozempic , Novolog  and Tresiba  and from [employer] for Farxiga  through 12/06/2024 - [employer] has announced that they will no longer offer Ozempic  to Medicare beneficiaries through their Patient Assistance Program in 2026.  - [facility] has received [employer] re-enrollment application back from patient and prescriber and faxed to [employer] on 10/13/2024 - [facility] faxed [employer] re-enrollment application to program on 10/18/2024   Objective:  Lab Results  Component Value Date   HGBA1C 7.1 (A) 08/24/2024    Lab Results  Component Value Date   CREATININE 0.85 02/14/2024   BUN 25 02/14/2024   NA 142 02/14/2024   K 4.0 02/14/2024   CL 101 02/14/2024   CO2 34 (H) 02/14/2024    Lab Results  Component Value Date   CHOL 116 02/14/2024   HDL 35 (L) 02/14/2024   LDLCALC 56 02/14/2024   TRIG 177 (H) 02/14/2024   CHOLHDL 3.3 02/14/2024    Medications Reviewed  Today     Reviewed by Alana Sharyle LABOR, RPH-CPP (Pharmacist) on 10/25/24 at 1712  Med List Status: <None>   Medication Order Taking? Sig Documenting Provider Last Dose Status Informant  Ascorbic Acid  (VITAMIN C) 1000 MG tablet [id number]  Take 1,000 mg by mouth daily. [provider]  Active Self  aspirin  EC 81 MG tablet [id number]  Take 81 mg by mouth daily. Swallow whole. [provider]  Active    atorvastatin  (LIPITOR) 20 MG tablet [id number]  Take 1 tablet (20 mg total) by mouth daily. Karamalegos, Marsa PARAS, DO  Active   busPIRone  (BUSPAR ) 15 MG tablet [id number]  Take 15 mg by mouth daily. [provider]  Active   celecoxib  (CELEBREX ) 200 MG capsule [id number]  TAKE 1 CAPSULE(200 MG) BY MOUTH TWICE DAILY Karamalegos, Marsa PARAS, DO  Active   cetirizine  (ZYRTEC ) 10 MG tablet [id number]  TAKE 1 TABLET(10 MG) BY MOUTH AT BEDTIME Edman Marsa PARAS, DO  Active   chlorthalidone  (HYGROTON ) 25 MG tablet [id number]  TAKE 1 TABLET(25 MG) BY MOUTH DAILY Karamalegos, Marsa PARAS, DO  Active   cholecalciferol  (VITAMIN D3) 25 MCG (1000 UNIT) tablet [id number]  Take 1,000 Units by mouth in the morning and at bedtime. [provider]  Active Self  Continuous Glucose Sensor (FREESTYLE LIBRE 3 PLUS SENSOR) MISC [id number]  Place 1 sensor on the skin every 15 days. Use to check glucose continuously Edman Marsa PARAS, DO  Active   dicyclomine  (BENTYL ) 10 MG capsule [id number]  TAKE 1 CAPSULE(10 MG) BY MOUTH FOUR TIMES DAILY BEFORE MEALS AND AT BEDTIME AS NEEDED Edman Marsa PARAS, DO  Active   FARXIGA  10 MG TABS tablet [id number] Yes Take 1 tablet (10 mg total) by mouth daily before breakfast. Edman Marsa PARAS, DO  Active   fenofibrate  micronized (LOFIBRA) 134 MG capsule [id number]  TAKE 1 CAPSULE(134 MG) BY MOUTH DAILY BEFORE BREAKFAST Karamalegos, Marsa PARAS, DO  Active   finasteride  (PROSCAR ) 5 MG tablet [id number]  TAKE 1 TABLET(5 MG) BY MOUTH EVERY EVENING Karamalegos, Marsa PARAS, DO  Active   fluticasone  (FLONASE ) 50 MCG/ACT nasal spray [id number]  SHAKE LIQUID AND USE 2 SPRAYS IN EACH NOSTRIL EVERY DAY Karamalegos, Marsa PARAS, DO  Active   gabapentin  (NEURONTIN ) 600 MG tablet [id number]  Take 600 mg by mouth in the morning, at noon, in the evening, and at bedtime. Taking in morning, evening and middle of the night  Patient taking differently: Take 600 mg by mouth  3 (three) times daily. Taking in morning, evening and middle of the night   [provider]  Expired 10/14/24 2359 Self           Med Note SOILA LYLE BROCKS   Tue Apr 07, 2022  5:10 PM)    hydrocortisone  2.5 % cream [id number]  APPLY TOPICALLY TO THE AFFECTED AREA OF FACE AND EARS 3 DAYS A WEEK ON MONDAYS, WEDNESDAYS, FRIDAYS Kowalski, David C, MD  Active   insulin  degludec (TRESIBA  FLEXTOUCH) 100 UNIT/ML FlexTouch Pen [id number]  Inject 68 Units into the skin at bedtime.  Patient taking differently: Inject 50 Units into the skin every evening.   Edman Marsa PARAS, DO  Active Self           Med Note [place], [facility] A   Fri Oct 15, 2023 11:20 AM)    Insulin  Pen Needle 31G X 5 MM MISC [id number]  Use with insulin  to inject into skin 3 times daily as directed Edman Marsa PARAS, DO  Active   ketoconazole  (NIZORAL ) 2 % cream [id number]  APPLY EXTERNALLY TO THE AFFECTED AREA OF FACE AND EARS 3 days a week on TUESDAY, THURSDAY, AND SATURDAY Hester Alm BROCKS, MD  Active   ketoconazole  (NIZORAL ) 2 % shampoo [id number]  apply 2 to 3 times per week, massage into scalp and leave in for 5 minutes before rinsing out Hester Alm BROCKS, MD  Active   losartan  (COZAAR ) 50 MG tablet [id number]  TAKE 1 TABLET(50 MG) BY MOUTH DAILY Edman, Marsa PARAS, DO  Active   metFORMIN  (GLUCOPHAGE -XR) 500 MG 24 hr tablet [id number] Yes Take 2 tablets (1,000 mg total) by mouth 2 (two) times daily with a meal. Edman, Marsa PARAS, DO  Active   Multiple Vitamin (MULTIVITAMIN) capsule [id number]  Take 1 capsule by mouth daily. [provider]  Active Self  NOVOLOG  FLEXPEN 100 UNIT/ML FlexPen [id number] Yes Inject 12-14 Units into the skin 2 (two) times daily. Sliding scale  Patient taking differently: Inject 10 Units into the skin 3 (three) times daily with meals. Sliding scale   Edman Marsa PARAS, DO  Active Self  Omega-3 Fatty Acids (FISH OIL) 1200 MG CAPS [id number]  Take 1,200 mg by mouth  in the morning and at bedtime. [provider]  Active Self  omeprazole  (PRILOSEC) 20 MG capsule [id number]  TAKE 1 CAPSULE BY MOUTH DAILY BEFORE BREAKFAST Karamalegos, Marsa PARAS, DO  Active   OZEMPIC , 1 MG/DOSE, 4 MG/3ML SOPN [id number] Yes Inject 1 mg into the skin once a week. Edman Marsa PARAS, DO  Active Self  pregabalin (LYRICA) 50 MG capsule [id number]  Take 50 mg by mouth in the morning, at noon, in the evening, and at bedtime. [provider]  Active   QUEtiapine (SEROQUEL) 25 MG tablet [id number]  Take 25 mg by mouth at bedtime. [provider]  Active   tamsulosin  (FLOMAX ) 0.4 MG CAPS capsule [id number]  TAKE 1 CAPSULE BY MOUTH EVERY NIGHT AT BEDTIME FOR URINE RETENTION Edman Marsa PARAS, DO  Active   traMADol  (ULTRAM ) 50 MG tablet [id number]  Take 50 mg by mouth 3 (three) times daily as needed. [provider]  Active            Med Note [place], [facility] A   Fri Oct 15, 2023 10:58 AM)    traZODone  (DESYREL ) 100 MG tablet [id number]  Take 1.5 tablets (150 mg total) by mouth at bedtime. Edman Marsa PARAS, DO  Active   venlafaxine  XR (EFFEXOR -XR) 150 MG 24 hr capsule [id number]  TAKE 1 CAPSULE(150 MG) BY MOUTH DAILY WITH BREAKFAST Karamalegos, Marsa PARAS, DO  Active               Assessment/Plan:   Diabetes: - Currently controlled - Reviewed dietary modifications including importance of having regular well-balanced meals and snacks throughout the day, while controlling carbohydrate portion sizes   Discuss ideas for balanced night time snacks - Recommend to continue to use Freestyle Libre 3 CGM to monitor blood sugar/as feedback on dietary choices Recommend to check glucose with fingerstick check when needed for symptoms and as back up to CGM.  Patient to contact office if needed for readings outside of established parameters or symptoms - Patient to follow up with [employer] patient assistance as needed for refills of Farxiga  and  with [employer] patient assistance as needed for refills of Novolog , Tresiba  and pen needles - Collaborating with CPhT and PCP to assist patient with re-enrollment for [employer] patient assistance for Farxiga  and with [employer] patient assistance for Novolog , Tresiba  and pen needles  Follow Up Plan:  Clinical Pharmacist will follow up with patient by telephone on 11/27/2024 at 10:30 AM    Sharyle Sia, PharmD, JAQUELINE, CPP Clinical Pharmacist [facility] [phone]

## 2024-10-25 NOTE — Patient Instructions (Signed)
 Goals Addressed             This Visit's Progress    Pharmacy Goals       If you need to reach out to patient assistance programs regarding refills or to find out the status of your application, you can do so by calling:  [employer] at [phone] [employer] at [phone]  Our goal A1c is less than 7%. This corresponds with fasting sugars less than 130 and 2 hour after meal sugars less than 180. Please check your blood sugar and keep a log of the results  Please check your home blood pressure, keep a log of the results and bring this with you to your medical appointments.  Our goal bad cholesterol, or LDL, is less than 70 . This is why it is important to continue taking your atorvastatin.  Estelle Grumbles, PharmD, [facility] Clinical Pharmacist  [facility] [phone]

## 2024-10-26 ENCOUNTER — Telehealth: Payer: Self-pay

## 2024-10-26 NOTE — Telephone Encounter (Signed)
 Completed refill reorder form for Ozempic  and faxed to providers office for review and signature.

## 2024-10-30 ENCOUNTER — Encounter: Payer: Self-pay | Admitting: Family Medicine

## 2024-11-01 ENCOUNTER — Ambulatory Visit: Payer: Medicare (Managed Care) | Admitting: Urology

## 2024-11-03 ENCOUNTER — Ambulatory Visit: Payer: Self-pay | Admitting: Urology

## 2024-11-16 ENCOUNTER — Ambulatory Visit: Payer: Medicare (Managed Care) | Attending: Neurology | Admitting: Physical Therapy

## 2024-11-16 ENCOUNTER — Other Ambulatory Visit: Payer: Self-pay

## 2024-11-16 DIAGNOSIS — R200 Anesthesia of skin: Secondary | ICD-10-CM

## 2024-11-16 DIAGNOSIS — R260 Ataxic gait: Secondary | ICD-10-CM | POA: Diagnosis present

## 2024-11-16 DIAGNOSIS — R293 Abnormal posture: Secondary | ICD-10-CM

## 2024-11-16 DIAGNOSIS — Z9181 History of falling: Secondary | ICD-10-CM | POA: Insufficient documentation

## 2024-11-16 DIAGNOSIS — R2681 Unsteadiness on feet: Secondary | ICD-10-CM

## 2024-11-16 DIAGNOSIS — M5415 Radiculopathy, thoracolumbar region: Secondary | ICD-10-CM

## 2024-11-16 NOTE — Therapy (Addendum)
 " OUTPATIENT PHYSICAL THERAPY NEURO EVALUATION   Patient Name: Eric Andrews MRN: [id number] DOB:January 07, 1951, 73 y.o., male Today's Date: 11/16/2024   PCP: Edman Marsa PARAS, DO  REFERRING PROVIDER: Lane Arthea BRAVO, MD   END OF SESSION:  PT End of Session - 11/16/24 1119     Visit Number 1    Number of Visits 24    Re-cert date  05/10/7972   PT Start Time 1145    PT Stop Time 1230    PT Time Calculation (min) 45 min    Equipment Utilized During Treatment Gait belt    Activity Tolerance Patient tolerated treatment well    Behavior During Therapy WFL for tasks assessed/performed          Past Medical History:  Diagnosis Date   ADD (attention deficit disorder)    Anemia    Anxiety    Arthritis    Depression    Diabetes mellitus without complication (HCC)    Enlarged prostate    GERD (gastroesophageal reflux disease)    History of kidney stones    Hypertension    Knee pain    Neuromuscular disorder (HCC)    lower legs- neuropathy   Sleep apnea    Past Surgical History:  Procedure Laterality Date   APPENDECTOMY     CARPEL TUNEL Bilateral    COLONOSCOPY W/ POLYPECTOMY     COLONOSCOPY WITH PROPOFOL  N/A 03/22/2023   Procedure: COLONOSCOPY WITH PROPOFOL ;  Surgeon: Therisa Bi, MD;  Location: [facility] ENDOSCOPY;  Service: Gastroenterology;  Laterality: N/A;   ESOPHAGOGASTRODUODENOSCOPY (EGD) WITH PROPOFOL  N/A 08/18/2023   Procedure: ESOPHAGOGASTRODUODENOSCOPY (EGD) WITH PROPOFOL ;  Surgeon: Therisa Bi, MD;  Location: [facility] ENDOSCOPY;  Service: Gastroenterology;  Laterality: N/A;   HERNIA REPAIR     Left   JOINT REPLACEMENT     KNEE ARTHROSCOPY     Left   RIGHT WRIST SX X2     TOTAL KNEE ARTHROPLASTY Left 04/21/2022   Procedure: TOTAL KNEE ARTHROPLASTY;  Surgeon: Kathlynn Sharper, MD;  Location: [facility] ORS;  Service: Orthopedics;  Laterality: Left;   TOTAL SHOULDER ARTHROPLASTY Left 01/05/2023   Procedure: Left total shoulder arthroplasty, biceps tenodesis;  Surgeon: Tobie Priest, MD;  Location: [facility] ORS;  Service: Orthopedics;  Laterality: Left;   vascetomy     Patient Active Problem List   Diagnosis Date Noted   Atherosclerosis of aorta 01/03/2024   OSA on CPAP 10/29/2023   Abdominal pain, epigastric 08/18/2023   Colon cancer screening 03/22/2023   Adenomatous polyp of colon 03/22/2023   Osteoarthritis of glenohumeral joint, left 01/20/2023   Status post total replacement of left shoulder 01/20/2023   S/P TKR (total knee replacement) using cement, left 04/21/2022   Bilateral primary osteoarthritis of knee 12/17/2021   Gastroesophageal reflux disease without esophagitis 07/08/2021   Environmental and seasonal allergies 02/27/2021   Hyperlipidemia associated with type 2 diabetes mellitus (HCC) 02/27/2021   Morbid obesity with BMI of 45.0-49.9, adult (HCC) 06/26/2020   Major depressive disorder, recurrent, moderate (HCC) 06/25/2020   GAD (generalized anxiety disorder) 06/25/2020   Type 2 diabetes mellitus with other specified complication (HCC) 05/27/2020   Chronic pain of both knees 05/27/2020   Primary osteoarthritis involving multiple joints 05/27/2020   Cataract associated with type 2 diabetes mellitus (HCC) 05/27/2020   History of torn meniscus of left knee 05/27/2020   DDD (degenerative disc disease), lumbar 05/27/2020    ONSET DATE: about 3 years ago, one year after he retired from working as a psychiatrist  manager  REFERRING DIAG:   R26.89 (ICD-10-CM) - Imbalance    THERAPY DIAG:  Abnormal posture  Ataxic gait  Anesthesia of skin  History of falling  Unsteadiness on feet  Radiculopathy, thoracolumbar region  Rationale for Evaluation and Treatment: Rehabilitation  SUBJECTIVE:                                                                                                                                                                                             SUBJECTIVE STATEMENT: Arrive to therapy noting difficulty with  balance, endurance, LE coordination, and lumbar spine and B hip pain. Reports his B lower back pain is controlled by the medications, usually a 6/10 pain, going into his hips. States his ambulation distance is limited, where he can only walk so much before everything falls apart, stating his legs don't have the strength to walk anymore. Notes most pain in the morning, which is slightly relieved by walking, but when he sits down after around 10-15 minutes of walking, the pain is worsened and his legs lose coordination and don't listen to his brain. Reports shaking in his left arm following activity occasionally, and only minor neck pain. Says no tingling or numbness in the LE anymore. Reports compression stockings cause skin of B LE to be extremely painful to the touch.   Pt accompanied by: self  PERTINENT HISTORY: From [facility]B referral: Patient presenting with lower extremity pain, 6/10 and difficulty walking long distances. Ambulating with cane/ No neck pain or difficulty with cervical rotation. Hand pain is intermittent, wearing wrist braces. Increased unstable gait, imbalance. No recent falls. Difficulty ambulating long distances. Referral to PT with gait and balance deficits.  PAIN:  Are you having pain? Yes: NPRS scale: 3/10 Pain location: B hips Pain description: NA, hard to describe Aggravating factors: walking longer distances, increased activity Relieving factors: during walking, medications, distractions  PRECAUTIONS: None  RED FLAGS: None   WEIGHT BEARING RESTRICTIONS: No  FALLS: Has patient fallen in last 6 months? 3 or 4 near falls in the 6 months, due to his legs giving out  LIVING ENVIRONMENT: Lives with: lives with their family and great grandson, granddaughter, and 3 daughters Lives in: House/apartment Stairs: Yes: Internal: one flight, only uses if he needs to do a job on second floor, but able to use stairs slowly steps; on left going up; one side of the house has a  ramp which he mostly uses, and one side with steps Has following equipment at home: Single point cane, Walker - 2 wheeled, and Walker - 4 wheeled, used the walkers in the past following a knee replacement  PLOF: Independent and was able to independently do yard work easier specifically  PATIENT GOALS: Improve balance, walking, LE pain, and back pain  OBJECTIVE:  Note: Objective measures were completed at Evaluation unless otherwise noted.  DIAGNOSTIC FINDINGS: From MRI of lumbar spine 02/13/21: Disc levels:   T12-L1: Mild degenerative change.  Negative for stenosis   L1-2: Mild degenerative change.  Negative for stenosis   L2-3: Moderate disc degeneration with disc space narrowing and spurring asymmetric to the left. Mild facet degeneration. Mild spinal stenosis. Mild subarticular stenosis bilaterally. Left lateral osteophyte formation. Left foramen mildly narrowed.   L3-4: Disc degeneration with disc space narrowing and spurring asymmetric to the right. Moderate facet hypertrophy bilaterally. Mild to moderate spinal stenosis. Mild to moderate subarticular and foraminal stenosis on the right. Mild left subarticular stenosis.   L4-5: Disc degeneration with diffuse bulging of the disc and small central disc protrusion. Moderate facet hypertrophy. Mild to moderate spinal stenosis. Moderate subarticular and foraminal stenosis bilaterally.   L5-S1: Disc degeneration with diffuse bulging of the disc. Moderate to advanced facet hypertrophy bilaterally. Moderate subarticular stenosis on the left and mild subarticular stenosis on the right.  IMPRESSION: Multilevel degenerative change throughout the lumbar spine. Multilevel spinal and foraminal stenosis as described above  COGNITION: Overall cognitive status: Within functional limits for tasks assessed   SENSATION: Light touch: Impaired  Tingling on whole L LE  COORDINATION: H<>S: WFL, but limited range  EDEMA: mild swelling  in B ankles, wears compression socks which usually cause pain  POSTURE: rounded shoulders, forward head, and flexed trunk   LOWER EXTREMITY MMT:    MMT Right Eval Left Eval  Hip flexion 4- 41  Hip extension    Hip abduction 4- 4-  Hip adduction 5 5  Hip internal rotation    Hip external rotation    Knee flexion 5 5  Knee extension 5 5  Ankle dorsiflexion 5 5  Ankle plantarflexion    Ankle inversion    Ankle eversion    (Blank rows = not tested)   -Lumbar ROM: Flexion: fingertips to mid shin, no pain Rotation: around 50* B, no pain Extension: WFL    RAMP:  Findings: uses ramp at home, says it easier than steps   STAIRS: Not tested GAIT: Findings: Gait Characteristics: decreased arm swing- Right, decreased arm swing- Left, decreased step length- Right, decreased step length- Left, lateral hip instability, and wide BOS and Assistive device utilized:Single point cane  FUNCTIONAL TESTS:   Pt performed 5 time sit<>stand (5xSTS): 16.59 sec (>15 sec indicates increased fall risk)    PT instructed pt in TUG: 8.86s sec (average of 3 trials; >13.5 sec indicates increased fall risk)   PATIENT SURVEYS:   *complete ABC at next session*                                                                                                                              TREATMENT DATE: 11/16/2024  PATIENT EDUCATION: Education details: purpose of PT, possible causes of LE sensation change and weakness due to pt lumbar spine degenerative changes Person educated: Patient Education method: Explanation Education comprehension: verbalized understanding  HOME EXERCISE PROGRAM: *administer at follow up*  GOALS: Goals reviewed with patient? No   SHORT TERM GOALS: Target date: 12/14/2024   Patient will be independent in home exercise program to improve strength/mobility for better functional independence with ADLs. Baseline: Goal status: INITIAL   LONG TERM GOALS:  02/08/2025   Patient will increase ABC scale score >80% to demonstrate better functional mobility and better confidence with ADLs.  Baseline: *next session* Goal status: INITIAL  2.  Patient (> 93 years old) will complete five times sit to stand test in < 15 seconds indicating an increased LE strength and improved balance. Baseline: 16.59s Goal status: INITIAL  3.  Patient will increase Berg Balance score by > 6 points to demonstrate decreased fall risk during functional activities Baseline: *next session* Goal status: INITIAL  4.  Patient will increase 10 meter walk test to >1.35m/s as to improve gait speed for better community ambulation and to reduce fall risk. Baseline: *next session* Goal status: INITIAL  5.  Patient will reduce timed up and go to <11 seconds to reduce fall risk and demonstrate improved transfer/gait ability. Baseline: 8.86s Goal status: INITIAL   6. Pt will increase distance of by 150 ft in order to increase ambulation tolerance in community and public environments.  Baseline: *next session*  Goal status: INITIAL   ASSESSMENT:  CLINICAL IMPRESSION: Patient is a 73 y.o. male who was seen today for physical therapy evaluation and treatment for imbalance and symptoms of lumbar spine radiculopathy and neuropathy. Pt shows decreased LE strength in L LE, impaired sensation in L LE causing pain to the touch and tingling, and mild B LE swelling. Pt ambulates using SPC, and reports pain in lumbar spine and B LE, causing decreased ability to ambulate longer distances and causing him to trip over his feet when they stop listening to his brain. Pt TUG score shows increased risk for falls. Pt shows good performance during 5STS, performing in just under 9 seconds using no UE support. Pt denies cervical pain, reporting only minor UE shaking occasionally, wearing braces on B hands. Only minor dizziness noted on initial stand, but states he doe not get dizzy very often. Pt  lumbar ROM all WFL, with no increase in pain or symptoms. Next session continue to explore balance deificts and walking endurance. Pt will continue to benefit from skilled therapy to address remaining deficits in order to improve overall QoL and return to PLOF.    OBJECTIVE IMPAIRMENTS: Abnormal gait, decreased balance, decreased coordination, decreased endurance, decreased mobility, difficulty walking, decreased strength, dizziness, increased edema, impaired flexibility, impaired sensation, and pain.   ACTIVITY LIMITATIONS: carrying, lifting, bending, stairs, and transfers  PARTICIPATION LIMITATIONS: cleaning, driving, and yard work  PERSONAL FACTORS: Age, Past/current experiences, Time since onset of injury/illness/exacerbation, and 3+ comorbidities: HTN, anemia, neuropathy, depression, past joint replacement, B LE neuropathy, etc. are also affecting patient's functional outcome.   REHAB POTENTIAL: Good  CLINICAL DECISION MAKING: Evolving/moderate complexity  EVALUATION COMPLEXITY: Moderate  PLAN:  PT FREQUENCY: 2x/week  PT DURATION: 12 weeks  PLANNED INTERVENTIONS: [id number]- PT Re-evaluation, 97750- Physical Performance Testing, 97110-Therapeutic exercises, 97530- Therapeutic activity, [id number]- Neuromuscular re-education, 97535- Self Care, [id number]- Manual therapy, [id number]- Gait training, [id number]- Orthotic Initial, [id number]- Orthotic/Prosthetic subsequent, [phone]- Canalith repositioning, [id number]- Aquatic Therapy, [id number]- Electrical stimulation (unattended), [phone] (  1-2 muscles), [id number] (3+ muscles)- Dry Needling, Patient/Family education, Balance training, Stair training, Joint mobilization, Joint manipulation, Spinal mobilization, Vestibular training, DME instructions, Wheelchair mobility training, Cryotherapy, and Moist heat  PLAN FOR NEXT SESSION:  -administer BERG - - -administer HEP  Renna Helling, SPT 11/16/2024, 5:03 PM        "

## 2024-11-17 ENCOUNTER — Other Ambulatory Visit: Payer: Self-pay | Admitting: Family Medicine

## 2024-11-17 DIAGNOSIS — E1169 Type 2 diabetes mellitus with other specified complication: Secondary | ICD-10-CM

## 2024-11-19 ENCOUNTER — Other Ambulatory Visit: Payer: Self-pay | Admitting: Family Medicine

## 2024-11-19 DIAGNOSIS — M51369 Other intervertebral disc degeneration, lumbar region without mention of lumbar back pain or lower extremity pain: Secondary | ICD-10-CM

## 2024-11-20 NOTE — Telephone Encounter (Signed)
 Requested Prescriptions  Refused Prescriptions Disp Refills   pioglitazone  (ACTOS ) 15 MG tablet [Pharmacy Med Name: PIOGLITAZONE  15MG  TABLETS] 90 tablet 0    Sig: TAKE 1 TABLET(15 MG) BY MOUTH DAILY     Endocrinology:  Diabetes - Glitazones - pioglitazone  Passed - 11/20/2024  2:27 PM      Passed - HBA1C is between 0 and 7.9 and within 180 days    Hemoglobin A1C  Date Value Ref Range Status  08/24/2024 7.1 (A) 4.0 - 5.6 % Final   Hgb A1c MFr Bld  Date Value Ref Range Status  02/14/2024 6.8 (H) <5.7 % of total Hgb Final    Comment:    For someone without known diabetes, a hemoglobin A1c value of 6.5% or greater indicates that they may have  diabetes and this should be confirmed with a follow-up  test. . For someone with known diabetes, a value <7% indicates  that their diabetes is well controlled and a value  greater than or equal to 7% indicates suboptimal  control. A1c targets should be individualized based on  duration of diabetes, age, comorbid conditions, and  other considerations. . Currently, no consensus exists regarding use of hemoglobin A1c for diagnosis of diabetes for children. SABRA Amy - Valid encounter within last 6 months    Recent Outpatient Visits           2 months ago Type 2 diabetes mellitus with other specified complication, with long-term current use of insulin  [facility]   [place] [facility] [place], Marsa PARAS, DO   4 months ago Chronic left-sided low back pain with left-sided sciatica   [facility] Edman Marsa PARAS, DO   9 months ago Annual physical exam   [facility] Edman Marsa PARAS, DO       Future Appointments             In 4 months Hester Alm BROCKS, MD [facility]   In 5 months Stoioff, Glendia BROCKS, MD [facility]

## 2024-11-21 ENCOUNTER — Ambulatory Visit: Payer: Medicare (Managed Care)

## 2024-11-21 DIAGNOSIS — R2681 Unsteadiness on feet: Secondary | ICD-10-CM

## 2024-11-21 DIAGNOSIS — R200 Anesthesia of skin: Secondary | ICD-10-CM

## 2024-11-21 DIAGNOSIS — R293 Abnormal posture: Secondary | ICD-10-CM | POA: Diagnosis not present

## 2024-11-21 DIAGNOSIS — M5415 Radiculopathy, thoracolumbar region: Secondary | ICD-10-CM

## 2024-11-21 DIAGNOSIS — Z9181 History of falling: Secondary | ICD-10-CM

## 2024-11-21 DIAGNOSIS — R260 Ataxic gait: Secondary | ICD-10-CM

## 2024-11-21 NOTE — Therapy (Signed)
 OUTPATIENT PHYSICAL THERAPY NEURO EVALUATION   Patient Name: Eric Andrews MRN: [id number] DOB:12/29/1950, 73 y.o., male Today's Date: 11/21/2024   PCP: Edman Marsa PARAS, DO  REFERRING PROVIDER: Lane Arthea BRAVO, MD   END OF SESSION:  PT End of Session - 11/21/24 1100     Visit Number 2    Number of Visits 24    PT Start Time 1100    PT Stop Time 1143    PT Time Calculation (min) 43 min    Equipment Utilized During Treatment Gait belt    Activity Tolerance Patient tolerated treatment well    Behavior During Therapy WFL for tasks assessed/performed          Past Medical History:  Diagnosis Date   ADD (attention deficit disorder)    Anemia    Anxiety    Arthritis    Depression    Diabetes mellitus without complication (HCC)    Enlarged prostate    GERD (gastroesophageal reflux disease)    History of kidney stones    Hypertension    Knee pain    Neuromuscular disorder (HCC)    lower legs- neuropathy   Sleep apnea    Past Surgical History:  Procedure Laterality Date   APPENDECTOMY     CARPEL TUNEL Bilateral    COLONOSCOPY W/ POLYPECTOMY     COLONOSCOPY WITH PROPOFOL  N/A 03/22/2023   Procedure: COLONOSCOPY WITH PROPOFOL ;  Surgeon: Therisa Bi, MD;  Location: [facility] ENDOSCOPY;  Service: Gastroenterology;  Laterality: N/A;   ESOPHAGOGASTRODUODENOSCOPY (EGD) WITH PROPOFOL  N/A 08/18/2023   Procedure: ESOPHAGOGASTRODUODENOSCOPY (EGD) WITH PROPOFOL ;  Surgeon: Therisa Bi, MD;  Location: [facility] ENDOSCOPY;  Service: Gastroenterology;  Laterality: N/A;   HERNIA REPAIR     Left   JOINT REPLACEMENT     KNEE ARTHROSCOPY     Left   RIGHT WRIST SX X2     TOTAL KNEE ARTHROPLASTY Left 04/21/2022   Procedure: TOTAL KNEE ARTHROPLASTY;  Surgeon: Kathlynn Sharper, MD;  Location: [facility] ORS;  Service: Orthopedics;  Laterality: Left;   TOTAL SHOULDER ARTHROPLASTY Left 01/05/2023   Procedure: Left total shoulder arthroplasty, biceps tenodesis;  Surgeon: Tobie Priest, MD;  Location:  [facility] ORS;  Service: Orthopedics;  Laterality: Left;   vascetomy     Patient Active Problem List   Diagnosis Date Noted   Atherosclerosis of aorta 01/03/2024   OSA on CPAP 10/29/2023   Abdominal pain, epigastric 08/18/2023   Colon cancer screening 03/22/2023   Adenomatous polyp of colon 03/22/2023   Osteoarthritis of glenohumeral joint, left 01/20/2023   Status post total replacement of left shoulder 01/20/2023   S/P TKR (total knee replacement) using cement, left 04/21/2022   Bilateral primary osteoarthritis of knee 12/17/2021   Gastroesophageal reflux disease without esophagitis 07/08/2021   Environmental and seasonal allergies 02/27/2021   Hyperlipidemia associated with type 2 diabetes mellitus (HCC) 02/27/2021   Morbid obesity with BMI of 45.0-49.9, adult (HCC) 06/26/2020   Major depressive disorder, recurrent, moderate (HCC) 06/25/2020   GAD (generalized anxiety disorder) 06/25/2020   Type 2 diabetes mellitus with other specified complication (HCC) 05/27/2020   Chronic pain of both knees 05/27/2020   Primary osteoarthritis involving multiple joints 05/27/2020   Cataract associated with type 2 diabetes mellitus (HCC) 05/27/2020   History of torn meniscus of left knee 05/27/2020   DDD (degenerative disc disease), lumbar 05/27/2020    ONSET DATE: about 3 years ago, one year after he retired from working as a heavy research scientist (physical sciences)  REFERRING DIAG:   R26.89 (  ICD-10-CM) - Imbalance    THERAPY DIAG:  Abnormal posture  Ataxic gait  Anesthesia of skin  History of falling  Unsteadiness on feet  Radiculopathy, thoracolumbar region  Rationale for Evaluation and Treatment: Rehabilitation  SUBJECTIVE:                                                                                                                                                                                             SUBJECTIVE STATEMENT: Patient reports feeling fairly good today. He reports he was okay  following the initial evaluation. He reports that his main goal with PT is to walk further distances. He arrived today with SPC.   Pt accompanied by: self  PERTINENT HISTORY: From AMB referral: Patient presenting with lower extremity pain, 6/10 and difficulty walking long distances. Ambulating with cane/ No neck pain or difficulty with cervical rotation. Hand pain is intermittent, wearing wrist braces. Increased unstable gait, imbalance. No recent falls. Difficulty ambulating long distances. Referral to PT with gait and balance deficits.  PAIN:  Are you having pain? Yes: NPRS scale: 3/10 Pain location: B hips Pain description: NA, hard to describe Aggravating factors: walking longer distances, increased activity Relieving factors: during walking, medications, distractions  PRECAUTIONS: None  RED FLAGS: None   WEIGHT BEARING RESTRICTIONS: No  FALLS: Has patient fallen in last 6 months? 3 or 4 near falls in the 6 months, due to his legs giving out  LIVING ENVIRONMENT: Lives with: lives with their family and great grandson, granddaughter, and 3 daughters Lives in: House/apartment Stairs: Yes: Internal: one flight, only uses if he needs to do a job on second floor, but able to use stairs slowly steps; on left going up; one side of the house has a ramp which he mostly uses, and one side with steps Has following equipment at home: Single point cane, Walker - 2 wheeled, and [employer] - 4 wheeled, used the walkers in the past following a knee replacement  PLOF: Independent and was able to independently do yard work easier specifically  PATIENT GOALS: Improve balance, walking, LE pain, and back pain  OBJECTIVE:  Note: Objective measures were completed at Evaluation unless otherwise noted.  DIAGNOSTIC FINDINGS: From MRI of lumbar spine 02/13/21: Disc levels:   T12-L1: Mild degenerative change.  Negative for stenosis   L1-2: Mild degenerative change.  Negative for stenosis   L2-3:  Moderate disc degeneration with disc space narrowing and spurring asymmetric to the left. Mild facet degeneration. Mild spinal stenosis. Mild subarticular stenosis bilaterally. Left lateral osteophyte formation. Left foramen mildly narrowed.   L3-4: Disc degeneration with disc space narrowing and spurring  asymmetric to the right. Moderate facet hypertrophy bilaterally. Mild to moderate spinal stenosis. Mild to moderate subarticular and foraminal stenosis on the right. Mild left subarticular stenosis.   L4-5: Disc degeneration with diffuse bulging of the disc and small central disc protrusion. Moderate facet hypertrophy. Mild to moderate spinal stenosis. Moderate subarticular and foraminal stenosis bilaterally.   L5-S1: Disc degeneration with diffuse bulging of the disc. Moderate to advanced facet hypertrophy bilaterally. Moderate subarticular stenosis on the left and mild subarticular stenosis on the right.  IMPRESSION: Multilevel degenerative change throughout the lumbar spine. Multilevel spinal and foraminal stenosis as described above  COGNITION: Overall cognitive status: Within functional limits for tasks assessed   SENSATION: Light touch: Impaired  Tingling on whole L LE  COORDINATION: H<>S: WFL, but limited range  EDEMA: mild swelling in B ankles, wears compression socks which usually cause pain  POSTURE: rounded shoulders, forward head, and flexed trunk   LOWER EXTREMITY MMT:    MMT Right Eval Left Eval  Hip flexion 4- 41  Hip extension    Hip abduction 4- 4-  Hip adduction 5 5  Hip internal rotation    Hip external rotation    Knee flexion 5 5  Knee extension 5 5  Ankle dorsiflexion 5 5  Ankle plantarflexion    Ankle inversion    Ankle eversion    (Blank rows = not tested)   -Lumbar ROM: Flexion: fingertips to mid shin, no pain Rotation: around 50* B, no pain Extension: WFL    RAMP:  Findings: uses ramp at home, says it easier than  steps   STAIRS: Not tested GAIT: Findings: Gait Characteristics: decreased arm swing- Right, decreased arm swing- Left, decreased step length- Right, decreased step length- Left, lateral hip instability, and wide BOS and Assistive device utilized:Single point cane  FUNCTIONAL TESTS:   Pt performed 5 time sit<>stand (5xSTS): 16.59 sec (>15 sec indicates increased fall risk)    PT instructed pt in TUG: 8.86s sec (average of 3 trials; >13.5 sec indicates increased fall risk)     PATIENT SURVEYS:   ABC Scale: 46.25%                                                                                                                              TREATMENT DATE: 11/21/2024  -ABC Scale - 46.25%   BERG: 50/56 : 700' with SPC, SBA : 0.75 m/s with SPC    -Step ups x10 each on 6'' step.  -Sit to stand from chair holding 3 kg ball. 2x10   -Airex - Romberg with EC 3x30''   -Gait: 1 lap using SPC near end of treatment session.    PATIENT EDUCATION: Education details: purpose of PT, possible causes of LE sensation change and weakness due to pt lumbar spine degenerative changes Person educated: Patient Education method: Explanation Education comprehension: verbalized understanding  HOME EXERCISE PROGRAM: *administer at follow up*  GOALS: Goals reviewed with patient? No  SHORT TERM GOALS: Target date: 12/14/2024   Patient will be independent in home exercise program to improve strength/mobility for better functional independence with ADLs. Baseline: Goal status: INITIAL   LONG TERM GOALS: 02/08/2025   Patient will increase ABC scale score >80% to demonstrate better functional mobility and better confidence with ADLs.  Baseline: 46.25% Goal status: INITIAL  2.  Patient (> 37 years old) will complete five times sit to stand test in < 15 seconds indicating an increased LE strength and improved balance. Baseline: 16.59s Goal status: INITIAL  3.  Patient will  increase Berg Balance score by > 6 points to demonstrate decreased fall risk during functional activities Baseline: 50/56 Goal status: INITIAL  4.  Patient will increase 10 meter walk test to >1.19m/s as to improve gait speed for better community ambulation and to reduce fall risk. Baseline: 0.75 m/s with SPC Goal status: INITIAL  5.  Patient will reduce timed up and go to <11 seconds to reduce fall risk and demonstrate improved transfer/gait ability. Baseline: 8.86s Goal status: INITIAL   6. Pt will increase distance of by 150 ft in order to increase ambulation tolerance in community and public environments.  Baseline: 700' with SPC  Goal status: INITIAL   ASSESSMENT:  CLINICAL IMPRESSION: Patient began today's session with various outcome measures to get BASELINE measurements following evaluation. Pt was noted to have decreased gait speed during today. He also had decreased endurance and gait speed during the today ambulating 700'. He was noted to have mild balance deficits with BERG scoring a 50/56 on the outcome measure. Following outcome assessments patient performed LE strengthening and balance exercises that included sit to stands, step-ups, and standing on Airex. He finished treatment with ambulating 1 lap (150'). Patient requested a chair to be transported back to the front door as he was worried that he was too fatigued to walk back upstairs. Overall, patient continues to be motivated to improve with PT treatment. He continues to show decreased LE strength, decreased activity tolerance, and decreased balance. Pt will benefit from continuing PT treatments at this tim.    OBJECTIVE IMPAIRMENTS: Abnormal gait, decreased balance, decreased coordination, decreased endurance, decreased mobility, difficulty walking, decreased strength, dizziness, increased edema, impaired flexibility, impaired sensation, and pain.   ACTIVITY LIMITATIONS: carrying, lifting, bending, stairs,  and transfers  PARTICIPATION LIMITATIONS: cleaning, driving, and yard work  PERSONAL FACTORS: Age, Past/current experiences, Time since onset of injury/illness/exacerbation, and 3+ comorbidities: HTN, anemia, neuropathy, depression, past joint replacement, B LE neuropathy, etc. are also affecting patient's functional outcome.   REHAB POTENTIAL: Good  CLINICAL DECISION MAKING: Evolving/moderate complexity  EVALUATION COMPLEXITY: Moderate  PLAN:  PT FREQUENCY: 2x/week  PT DURATION: 12 weeks  PLANNED INTERVENTIONS: 97164- PT Re-evaluation, 97750- Physical Performance Testing, 97110-Therapeutic exercises, 97530- Therapeutic activity, 97112- Neuromuscular re-education, 97535- Self Care, [id number]- Manual therapy, [phone]- Gait training, [phone]- Orthotic Initial, [phone]- Orthotic/Prosthetic subsequent, [phone]- Canalith repositioning, [id number]- Aquatic Therapy, [phone]- Electrical stimulation (unattended), [phone] (1-2 muscles), [phone] (3+ muscles)- Dry Needling, Patient/Family education, Balance training, Stair training, Joint mobilization, Joint manipulation, Spinal mobilization, Vestibular training, DME instructions, Wheelchair mobility training, Cryotherapy, and Moist heat  PLAN FOR NEXT SESSION:  -Administer HEP  Norman Sharps, PT, DPT Physical Therapist - [facility]  11/21/2024, 12:08 PM

## 2024-11-22 NOTE — Telephone Encounter (Signed)
 PAP: Patient assistance application for Novolog  and Tresiba  has been approved by PAP Companies: [employer] from 12/07/2024 to 12/06/2025. Medication should be delivered to PAP Delivery: Provider's office. For further shipping updates, please contact [employer] at [phone]. Patient ID is: [id number].

## 2024-11-22 NOTE — Telephone Encounter (Signed)
 Requested Prescriptions  Pending Prescriptions Disp Refills   celecoxib  (CELEBREX ) 200 MG capsule [Pharmacy Med Name: CELECOXIB  200MG  CAPSULES] 180 capsule 0    Sig: TAKE 1 CAPSULE(200 MG) BY MOUTH TWICE DAILY     Analgesics:  COX2 Inhibitors Failed - 11/22/2024  8:49 AM      Failed - Manual Review: Labs are only required if the patient has taken medication for more than 8 weeks.      Failed - AST in normal range and within 360 days    AST  Date Value Ref Range Status  02/14/2024 37 (H) 10 - 35 U/L Final         Failed - ALT in normal range and within 360 days    ALT  Date Value Ref Range Status  02/14/2024 64 (H) 9 - 46 U/L Final         Passed - HGB in normal range and within 360 days    Hemoglobin  Date Value Ref Range Status  02/14/2024 16.0 13.2 - 17.1 g/dL Final  93/87/7975 84.9 13.0 - 17.7 g/dL Final         Passed - Cr in normal range and within 360 days    Creat  Date Value Ref Range Status  02/14/2024 0.85 0.70 - 1.28 mg/dL Final         Passed - HCT in normal range and within 360 days    HCT  Date Value Ref Range Status  02/14/2024 48.7 38.5 - 50.0 % Final   Hematocrit  Date Value Ref Range Status  05/19/2023 46.9 37.5 - 51.0 % Final         Passed - eGFR is 30 or above and within 360 days    GFR, Estimated  Date Value Ref Range Status  12/22/2022 >60 >60 mL/min Final    Comment:    (NOTE) Calculated using the CKD-EPI Creatinine Equation (2021)    eGFR  Date Value Ref Range Status  02/14/2024 93 > OR = 60 mL/min/1.62m2 Final  05/19/2023 80 >59 mL/min/1.73 Final         Passed - Patient is not pregnant      Passed - Valid encounter within last 12 months    Recent Outpatient Visits           3 months ago Type 2 diabetes mellitus with other specified complication, with long-term current use of insulin  [facility])   [facility] Stephens, Marsa PARAS, DO   4 months ago Chronic left-sided low back pain with left-sided  sciatica   [facility] Edman Marsa PARAS, DO   9 months ago Annual physical exam   [facility] Edman Marsa PARAS, DO       Future Appointments             In 4 months Hester Alm BROCKS, MD [facility]   In 5 months Stoioff, Glendia BROCKS, MD [facility] [place]

## 2024-11-23 ENCOUNTER — Ambulatory Visit: Payer: Medicare (Managed Care)

## 2024-11-23 DIAGNOSIS — Z9181 History of falling: Secondary | ICD-10-CM

## 2024-11-23 DIAGNOSIS — R293 Abnormal posture: Secondary | ICD-10-CM | POA: Diagnosis not present

## 2024-11-23 DIAGNOSIS — R2681 Unsteadiness on feet: Secondary | ICD-10-CM

## 2024-11-23 DIAGNOSIS — M5415 Radiculopathy, thoracolumbar region: Secondary | ICD-10-CM

## 2024-11-23 DIAGNOSIS — R200 Anesthesia of skin: Secondary | ICD-10-CM

## 2024-11-23 DIAGNOSIS — R260 Ataxic gait: Secondary | ICD-10-CM

## 2024-11-23 NOTE — Progress Notes (Signed)
 Eric Andrews                                          MRN: [id number]   11/23/2024   The [facility] Quality Team Specialist reviewed this patient medical record for the purposes of chart review for care gap closure. The following were reviewed: chart review for care gap closure-kidney health evaluation for diabetes:eGFR  and uACR.    [facility] Quality Team

## 2024-11-23 NOTE — Therapy (Signed)
 OUTPATIENT PHYSICAL THERAPY NEURO EVALUATION   Patient Name: Eric Andrews MRN: [id number] DOB:Jul 20, 1951, 73 y.o., male Today's Date: 11/23/2024   PCP: Edman Marsa PARAS, DO  REFERRING PROVIDER: Lane Arthea BRAVO, MD   END OF SESSION:  PT End of Session - 11/23/24 1306     Visit Number 3    Number of Visits 24    PT Start Time 1017    PT Stop Time 1057    PT Time Calculation (min) 40 min    Equipment Utilized During Treatment Gait belt    Activity Tolerance Patient tolerated treatment well    Behavior During Therapy WFL for tasks assessed/performed           Past Medical History:  Diagnosis Date   ADD (attention deficit disorder)    Anemia    Anxiety    Arthritis    Depression    Diabetes mellitus without complication (HCC)    Enlarged prostate    GERD (gastroesophageal reflux disease)    History of kidney stones    Hypertension    Knee pain    Neuromuscular disorder (HCC)    lower legs- neuropathy   Sleep apnea    Past Surgical History:  Procedure Laterality Date   APPENDECTOMY     CARPEL TUNEL Bilateral    COLONOSCOPY W/ POLYPECTOMY     COLONOSCOPY WITH PROPOFOL  N/A 03/22/2023   Procedure: COLONOSCOPY WITH PROPOFOL ;  Surgeon: Therisa Bi, MD;  Location: [facility] ENDOSCOPY;  Service: Gastroenterology;  Laterality: N/A;   ESOPHAGOGASTRODUODENOSCOPY (EGD) WITH PROPOFOL  N/A 08/18/2023   Procedure: ESOPHAGOGASTRODUODENOSCOPY (EGD) WITH PROPOFOL ;  Surgeon: Therisa Bi, MD;  Location: [facility] ENDOSCOPY;  Service: Gastroenterology;  Laterality: N/A;   HERNIA REPAIR     Left   JOINT REPLACEMENT     KNEE ARTHROSCOPY     Left   RIGHT WRIST SX X2     TOTAL KNEE ARTHROPLASTY Left 04/21/2022   Procedure: TOTAL KNEE ARTHROPLASTY;  Surgeon: Kathlynn Sharper, MD;  Location: [facility] ORS;  Service: Orthopedics;  Laterality: Left;   TOTAL SHOULDER ARTHROPLASTY Left 01/05/2023   Procedure: Left total shoulder arthroplasty, biceps tenodesis;  Surgeon: Tobie Priest, MD;  Location:  [facility] ORS;  Service: Orthopedics;  Laterality: Left;   vascetomy     Patient Active Problem List   Diagnosis Date Noted   Atherosclerosis of aorta 01/03/2024   OSA on CPAP 10/29/2023   Abdominal pain, epigastric 08/18/2023   Colon cancer screening 03/22/2023   Adenomatous polyp of colon 03/22/2023   Osteoarthritis of glenohumeral joint, left 01/20/2023   Status post total replacement of left shoulder 01/20/2023   S/P TKR (total knee replacement) using cement, left 04/21/2022   Bilateral primary osteoarthritis of knee 12/17/2021   Gastroesophageal reflux disease without esophagitis 07/08/2021   Environmental and seasonal allergies 02/27/2021   Hyperlipidemia associated with type 2 diabetes mellitus (HCC) 02/27/2021   Morbid obesity with BMI of 45.0-49.9, adult (HCC) 06/26/2020   Major depressive disorder, recurrent, moderate (HCC) 06/25/2020   GAD (generalized anxiety disorder) 06/25/2020   Type 2 diabetes mellitus with other specified complication (HCC) 05/27/2020   Chronic pain of both knees 05/27/2020   Primary osteoarthritis involving multiple joints 05/27/2020   Cataract associated with type 2 diabetes mellitus (HCC) 05/27/2020   History of torn meniscus of left knee 05/27/2020   DDD (degenerative disc disease), lumbar 05/27/2020    ONSET DATE: about 3 years ago, one year after he retired from working as a heavy research scientist (physical sciences)  REFERRING DIAG:  R26.89 (ICD-10-CM) - Imbalance    THERAPY DIAG:  Abnormal posture  Ataxic gait  Anesthesia of skin  History of falling  Unsteadiness on feet  Radiculopathy, thoracolumbar region  Rationale for Evaluation and Treatment: Rehabilitation  SUBJECTIVE:                                                                                                                                                                                             SUBJECTIVE STATEMENT: Patient arrived using [facility] for ambulation. He reports that he is  doing okay today, but his legs are not wanting to work. He reports he had mild soreness following previous treatment, but it did go away.   Pt accompanied by: self  PERTINENT HISTORY: From AMB referral: Patient presenting with lower extremity pain, 6/10 and difficulty walking long distances. Ambulating with cane/ No neck pain or difficulty with cervical rotation. Hand pain is intermittent, wearing wrist braces. Increased unstable gait, imbalance. No recent falls. Difficulty ambulating long distances. Referral to PT with gait and balance deficits.  PAIN:  Are you having pain? Yes: NPRS scale: 3/10 Pain location: B hips Pain description: NA, hard to describe Aggravating factors: walking longer distances, increased activity Relieving factors: during walking, medications, distractions  PRECAUTIONS: None  RED FLAGS: None   WEIGHT BEARING RESTRICTIONS: No  FALLS: Has patient fallen in last 6 months? 3 or 4 near falls in the 6 months, due to his legs giving out  LIVING ENVIRONMENT: Lives with: lives with their family and great grandson, granddaughter, and 3 daughters Lives in: House/apartment Stairs: Yes: Internal: one flight, only uses if he needs to do a job on second floor, but able to use stairs slowly steps; on left going up; one side of the house has a ramp which he mostly uses, and one side with steps Has following equipment at home: Single point cane, Walker - 2 wheeled, and [employer] - 4 wheeled, used the walkers in the past following a knee replacement  PLOF: Independent and was able to independently do yard work easier specifically  PATIENT GOALS: Improve balance, walking, LE pain, and back pain  OBJECTIVE:  Note: Objective measures were completed at Evaluation unless otherwise noted.  DIAGNOSTIC FINDINGS: From MRI of lumbar spine 02/13/21: Disc levels:   T12-L1: Mild degenerative change.  Negative for stenosis   L1-2: Mild degenerative change.  Negative for stenosis    L2-3: Moderate disc degeneration with disc space narrowing and spurring asymmetric to the left. Mild facet degeneration. Mild spinal stenosis. Mild subarticular stenosis bilaterally. Left lateral osteophyte formation. Left foramen mildly narrowed.   L3-4: Disc degeneration with disc  space narrowing and spurring asymmetric to the right. Moderate facet hypertrophy bilaterally. Mild to moderate spinal stenosis. Mild to moderate subarticular and foraminal stenosis on the right. Mild left subarticular stenosis.   L4-5: Disc degeneration with diffuse bulging of the disc and small central disc protrusion. Moderate facet hypertrophy. Mild to moderate spinal stenosis. Moderate subarticular and foraminal stenosis bilaterally.   L5-S1: Disc degeneration with diffuse bulging of the disc. Moderate to advanced facet hypertrophy bilaterally. Moderate subarticular stenosis on the left and mild subarticular stenosis on the right.  IMPRESSION: Multilevel degenerative change throughout the lumbar spine. Multilevel spinal and foraminal stenosis as described above  COGNITION: Overall cognitive status: Within functional limits for tasks assessed   SENSATION: Light touch: Impaired  Tingling on whole L LE  COORDINATION: H<>S: WFL, but limited range  EDEMA: mild swelling in B ankles, wears compression socks which usually cause pain  POSTURE: rounded shoulders, forward head, and flexed trunk   LOWER EXTREMITY MMT:    MMT Right Eval Left Eval  Hip flexion 4- 41  Hip extension    Hip abduction 4- 4-  Hip adduction 5 5  Hip internal rotation    Hip external rotation    Knee flexion 5 5  Knee extension 5 5  Ankle dorsiflexion 5 5  Ankle plantarflexion    Ankle inversion    Ankle eversion    (Blank rows = not tested)   -Lumbar ROM: Flexion: fingertips to mid shin, no pain Rotation: around 50* B, no pain Extension: WFL    RAMP:  Findings: uses ramp at home, says it easier than  steps   STAIRS: Not tested GAIT: Findings: Gait Characteristics: decreased arm swing- Right, decreased arm swing- Left, decreased step length- Right, decreased step length- Left, lateral hip instability, and wide BOS and Assistive device utilized:Single point cane  FUNCTIONAL TESTS:   Pt performed 5 time sit<>stand (5xSTS): 16.59 sec (>15 sec indicates increased fall risk)    PT instructed pt in TUG: 8.86s sec (average of 3 trials; >13.5 sec indicates increased fall risk)     PATIENT SURVEYS:   ABC Scale: 46.25%                                                                                                                              TREATMENT DATE: 11/23/2024  TA:  -NuStep: [place] - Levels 3-6 x6'.  -Sit to stand from chair holding 3 kg ball:  3x10  -Step ups x15 each on 6'' step.  -Gait with 2.5 lb. AW donned: 450'   -NMR: -Airex - Romberg with EC 3x30''  -Semi-tandem stance: 2x30'' each.  -Toe taps on 6'' step: 15x each.  -Marching on Airex: x15 each.      PATIENT EDUCATION: Education details: purpose of PT, possible causes of LE sensation change and weakness due to pt lumbar spine degenerative changes Person educated: Patient Education method: Explanation Education comprehension: verbalized understanding  HOME EXERCISE PROGRAM: *administer at  follow up*  GOALS: Goals reviewed with patient? No   SHORT TERM GOALS: Target date: 12/14/2024   Patient will be independent in home exercise program to improve strength/mobility for better functional independence with ADLs. Baseline: Goal status: INITIAL   LONG TERM GOALS: 02/08/2025   Patient will increase ABC scale score >80% to demonstrate better functional mobility and better confidence with ADLs.  Baseline: 46.25% Goal status: INITIAL  2.  Patient (> 76 years old) will complete five times sit to stand test in < 15 seconds indicating an increased LE strength and improved balance. Baseline: 16.59s Goal  status: INITIAL  3.  Patient will increase Berg Balance score by > 6 points to demonstrate decreased fall risk during functional activities Baseline: 50/56 Goal status: INITIAL  4.  Patient will increase 10 meter walk test to >1.18m/s as to improve gait speed for better community ambulation and to reduce fall risk. Baseline: 0.75 m/s with SPC Goal status: INITIAL  5.  Patient will reduce timed up and go to <11 seconds to reduce fall risk and demonstrate improved transfer/gait ability. Baseline: 8.86s Goal status: INITIAL   6. Pt will increase distance of by 150 ft in order to increase ambulation tolerance in community and public environments.  Baseline: 700' with SPC  Goal status: INITIAL   ASSESSMENT:  CLINICAL IMPRESSION: Patient began today's session with Nustep machine to improve cardiovascular response to exercise and activity tolerance. He then performed various balance exercises at balance station using airex for increased difficulty. Sit to stands with weighted ball and gait with ankle weights were also performed to improve activity tolerance. Patient continues to show decreased balance with balance activities requiring min. A. To prevent LOB at times. He also continues to get [facility] and is easily fatigued requiring increased rest breaks. Pt will benefit from continuing PT at this time to improve on deficits listed above.   OBJECTIVE IMPAIRMENTS: Abnormal gait, decreased balance, decreased coordination, decreased endurance, decreased mobility, difficulty walking, decreased strength, dizziness, increased edema, impaired flexibility, impaired sensation, and pain.   ACTIVITY LIMITATIONS: carrying, lifting, bending, stairs, and transfers  PARTICIPATION LIMITATIONS: cleaning, driving, and yard work  PERSONAL FACTORS: Age, Past/current experiences, Time since onset of injury/illness/exacerbation, and 3+ comorbidities: HTN, anemia, neuropathy, depression, past joint replacement, B  LE neuropathy, etc. are also affecting patient's functional outcome.   REHAB POTENTIAL: Good  CLINICAL DECISION MAKING: Evolving/moderate complexity  EVALUATION COMPLEXITY: Moderate  PLAN:  PT FREQUENCY: 2x/week  PT DURATION: 12 weeks  PLANNED INTERVENTIONS: [id number]- PT Re-evaluation, 97750- Physical Performance Testing, 97110-Therapeutic exercises, 97530- Therapeutic activity, [id number]- Neuromuscular re-education, 97535- Self Care, [id number]- Manual therapy, [id number]- Gait training, [phone]- Orthotic Initial, [phone]- Orthotic/Prosthetic subsequent, [phone]- Canalith repositioning, [id number]- Aquatic Therapy, [phone]- Electrical stimulation (unattended), [phone] (1-2 muscles), [id number] (3+ muscles)- Dry Needling, Patient/Family education, Balance training, Stair training, Joint mobilization, Joint manipulation, Spinal mobilization, Vestibular training, DME instructions, Wheelchair mobility training, Cryotherapy, and Moist heat  PLAN FOR NEXT SESSION:  -Continue working to improve activity tolerance, gait distance, and balance with progressive exercises to patient's tolerance level.   Norman Sharps, PT, DPT Physical Therapist - [facility]  11/23/2024, 1:09 PM

## 2024-11-27 ENCOUNTER — Ambulatory Visit: Payer: Medicare (Managed Care) | Admitting: Physical Therapy

## 2024-11-27 ENCOUNTER — Other Ambulatory Visit: Payer: Medicare (Managed Care)

## 2024-11-27 DIAGNOSIS — Z9181 History of falling: Secondary | ICD-10-CM

## 2024-11-27 DIAGNOSIS — R293 Abnormal posture: Secondary | ICD-10-CM | POA: Diagnosis not present

## 2024-11-27 DIAGNOSIS — R2681 Unsteadiness on feet: Secondary | ICD-10-CM

## 2024-11-27 DIAGNOSIS — M5415 Radiculopathy, thoracolumbar region: Secondary | ICD-10-CM

## 2024-11-27 DIAGNOSIS — R260 Ataxic gait: Secondary | ICD-10-CM

## 2024-11-27 NOTE — Therapy (Signed)
 " OUTPATIENT PHYSICAL THERAPY NEURO EVALUATION   Patient Name: Eric Andrews MRN: [id number] DOB:01/01/1951, 73 y.o., male Today's Date: 11/27/2024   PCP: Edman Marsa PARAS, DO  REFERRING PROVIDER: Lane Arthea BRAVO, MD   END OF SESSION:  PT End of Session - 11/27/24 1050     Visit Number 4    Number of Visits 24    PT Start Time 1055    PT Stop Time 1135    PT Time Calculation (min) 40 min    Equipment Utilized During Treatment Gait belt    Activity Tolerance Patient tolerated treatment well    Behavior During Therapy WFL for tasks assessed/performed           Past Medical History:  Diagnosis Date   ADD (attention deficit disorder)    Anemia    Anxiety    Arthritis    Depression    Diabetes mellitus without complication (HCC)    Enlarged prostate    GERD (gastroesophageal reflux disease)    History of kidney stones    Hypertension    Knee pain    Neuromuscular disorder (HCC)    lower legs- neuropathy   Sleep apnea    Past Surgical History:  Procedure Laterality Date   APPENDECTOMY     CARPEL TUNEL Bilateral    COLONOSCOPY W/ POLYPECTOMY     COLONOSCOPY WITH PROPOFOL  N/A 03/22/2023   Procedure: COLONOSCOPY WITH PROPOFOL ;  Surgeon: Therisa Bi, MD;  Location: [facility] ENDOSCOPY;  Service: Gastroenterology;  Laterality: N/A;   ESOPHAGOGASTRODUODENOSCOPY (EGD) WITH PROPOFOL  N/A 08/18/2023   Procedure: ESOPHAGOGASTRODUODENOSCOPY (EGD) WITH PROPOFOL ;  Surgeon: Therisa Bi, MD;  Location: [facility] ENDOSCOPY;  Service: Gastroenterology;  Laterality: N/A;   HERNIA REPAIR     Left   JOINT REPLACEMENT     KNEE ARTHROSCOPY     Left   RIGHT WRIST SX X2     TOTAL KNEE ARTHROPLASTY Left 04/21/2022   Procedure: TOTAL KNEE ARTHROPLASTY;  Surgeon: Kathlynn Sharper, MD;  Location: [facility] ORS;  Service: Orthopedics;  Laterality: Left;   TOTAL SHOULDER ARTHROPLASTY Left 01/05/2023   Procedure: Left total shoulder arthroplasty, biceps tenodesis;  Surgeon: Tobie Priest, MD;  Location:  [facility] ORS;  Service: Orthopedics;  Laterality: Left;   vascetomy     Patient Active Problem List   Diagnosis Date Noted   Atherosclerosis of aorta 01/03/2024   OSA on CPAP 10/29/2023   Abdominal pain, epigastric 08/18/2023   Colon cancer screening 03/22/2023   Adenomatous polyp of colon 03/22/2023   Osteoarthritis of glenohumeral joint, left 01/20/2023   Status post total replacement of left shoulder 01/20/2023   S/P TKR (total knee replacement) using cement, left 04/21/2022   Bilateral primary osteoarthritis of knee 12/17/2021   Gastroesophageal reflux disease without esophagitis 07/08/2021   Environmental and seasonal allergies 02/27/2021   Hyperlipidemia associated with type 2 diabetes mellitus (HCC) 02/27/2021   Morbid obesity with BMI of 45.0-49.9, adult (HCC) 06/26/2020   Major depressive disorder, recurrent, moderate (HCC) 06/25/2020   GAD (generalized anxiety disorder) 06/25/2020   Type 2 diabetes mellitus with other specified complication (HCC) 05/27/2020   Chronic pain of both knees 05/27/2020   Primary osteoarthritis involving multiple joints 05/27/2020   Cataract associated with type 2 diabetes mellitus (HCC) 05/27/2020   History of torn meniscus of left knee 05/27/2020   DDD (degenerative disc disease), lumbar 05/27/2020    ONSET DATE: about 3 years ago, one year after he retired from working as a heavy research scientist (physical sciences)  REFERRING DIAG:  R26.89 (ICD-10-CM) - Imbalance    THERAPY DIAG:  Abnormal posture  Ataxic gait  History of falling  Unsteadiness on feet  Radiculopathy, thoracolumbar region  Rationale for Evaluation and Treatment: Rehabilitation  SUBJECTIVE:                                                                                                                                                                                             SUBJECTIVE STATEMENT: Patient arrived using [facility] for ambulation. He reports that he is doing okay today; but  states that his back Is sore this AM. States that his back is sore following PT for a day or 2, but with rest it recovers.  Pt accompanied by: self  PERTINENT HISTORY: From AMB referral: Patient presenting with lower extremity pain, 6/10 and difficulty walking long distances. Ambulating with cane/ No neck pain or difficulty with cervical rotation. Hand pain is intermittent, wearing wrist braces. Increased unstable gait, imbalance. No recent falls. Difficulty ambulating long distances. Referral to PT with gait and balance deficits.  PAIN:  Are you having pain? Yes: NPRS scale: 3/10 Pain location: B hips Pain description: NA, hard to describe Aggravating factors: walking longer distances, increased activity Relieving factors: during walking, medications, distractions  PRECAUTIONS: None  RED FLAGS: None   WEIGHT BEARING RESTRICTIONS: No  FALLS: Has patient fallen in last 6 months? 3 or 4 near falls in the 6 months, due to his legs giving out  LIVING ENVIRONMENT: Lives with: lives with their family and great grandson, granddaughter, and 3 daughters Lives in: House/apartment Stairs: Yes: Internal: one flight, only uses if he needs to do a job on second floor, but able to use stairs slowly steps; on left going up; one side of the house has a ramp which he mostly uses, and one side with steps Has following equipment at home: Single point cane, Walker - 2 wheeled, and [employer] - 4 wheeled, used the walkers in the past following a knee replacement  PLOF: Independent and was able to independently do yard work easier specifically  PATIENT GOALS: Improve balance, walking, LE pain, and back pain  OBJECTIVE:  Note: Objective measures were completed at Evaluation unless otherwise noted.  DIAGNOSTIC FINDINGS: From MRI of lumbar spine 02/13/21: Disc levels:   T12-L1: Mild degenerative change.  Negative for stenosis   L1-2: Mild degenerative change.  Negative for stenosis   L2-3: Moderate  disc degeneration with disc space narrowing and spurring asymmetric to the left. Mild facet degeneration. Mild spinal stenosis. Mild subarticular stenosis bilaterally. Left lateral osteophyte formation. Left foramen mildly narrowed.   L3-4: Disc degeneration with disc  space narrowing and spurring asymmetric to the right. Moderate facet hypertrophy bilaterally. Mild to moderate spinal stenosis. Mild to moderate subarticular and foraminal stenosis on the right. Mild left subarticular stenosis.   L4-5: Disc degeneration with diffuse bulging of the disc and small central disc protrusion. Moderate facet hypertrophy. Mild to moderate spinal stenosis. Moderate subarticular and foraminal stenosis bilaterally.   L5-S1: Disc degeneration with diffuse bulging of the disc. Moderate to advanced facet hypertrophy bilaterally. Moderate subarticular stenosis on the left and mild subarticular stenosis on the right.  IMPRESSION: Multilevel degenerative change throughout the lumbar spine. Multilevel spinal and foraminal stenosis as described above  COGNITION: Overall cognitive status: Within functional limits for tasks assessed   SENSATION: Light touch: Impaired  Tingling on whole L LE  COORDINATION: H<>S: WFL, but limited range  EDEMA: mild swelling in B ankles, wears compression socks which usually cause pain  POSTURE: rounded shoulders, forward head, and flexed trunk   LOWER EXTREMITY MMT:    MMT Right Eval Left Eval  Hip flexion 4- 41  Hip extension    Hip abduction 4- 4-  Hip adduction 5 5  Hip internal rotation    Hip external rotation    Knee flexion 5 5  Knee extension 5 5  Ankle dorsiflexion 5 5  Ankle plantarflexion    Ankle inversion    Ankle eversion    (Blank rows = not tested)   -Lumbar ROM: Flexion: fingertips to mid shin, no pain Rotation: around 50* B, no pain Extension: WFL    RAMP:  Findings: uses ramp at home, says it easier than  steps   STAIRS: Not tested GAIT: Findings: Gait Characteristics: decreased arm swing- Right, decreased arm swing- Left, decreased step length- Right, decreased step length- Left, lateral hip instability, and wide BOS and Assistive device utilized:Single point cane  FUNCTIONAL TESTS:   Pt performed 5 time sit<>stand (5xSTS): 16.59 sec (>15 sec indicates increased fall risk)    PT instructed pt in TUG: 8.86s sec (average of 3 trials; >13.5 sec indicates increased fall risk)     PATIENT SURVEYS:   ABC Scale: 46.25%                                                                                                                              TREATMENT DATE: 11/27/2024  TA and TE for improved strengtheing, ROM and safety with functional daily activities:   -NuStep: [place] - Levels 3-6 x6'. Hot pack in place throughout entire 6 min.   Seated piriformis stretch 2 x 20 sec bil  Seated HS stretch 2 x 20 sec bil  Standing hip flexor stretch on second step 2 x 15 sec bil   Side stepping ar rail with GTB distal to knees x 5 bil across 67ft rail.   Sit<>stand with 3 KG ball press overhead 2x 8 prolonged therapeutic rest break between bouts.   Seated hip abduction GTB x 10.   Standing with  1 foot up on airex pad on 6inch step 3 x 15 sec bil. UE support only for switching BLE   With gait within gym, PT noted hip trendelenburg R>L.      PATIENT EDUCATION: Education details: purpose of PT, possible causes of LE sensation change and weakness due to pt lumbar spine degenerative changes Person educated: Patient Education method: Explanation Education comprehension: verbalized understanding  HOME EXERCISE PROGRAM: Access Code: [email or web address] URL: [email or web address] Date: 11/27/2024 Prepared by: Massie Dollar  Exercises - Seated Piriformis Stretch  - 1 x daily - 7 x weekly - 3 sets - 10 reps - 20 sec  hold - Seated Hamstring Stretch  - 1 x daily - 7 x weekly - 3 sets - 10  reps - 20 sec hold - Standing Hip Flexor Stretch  - 1 x daily - 7 x weekly - 3 sets - 10 reps - 20 sec  hold - Side Stepping with Resistance at Thighs and Counter Support  - 1 x daily - 4 x weekly - 3 sets - 5 reps - Seated Hip Abduction with Resistance  - 1 x daily - 7 x weekly - 3 sets - 10 reps  GOALS: Goals reviewed with patient? No   SHORT TERM GOALS: Target date: 12/14/2024   Patient will be independent in home exercise program to improve strength/mobility for better functional independence with ADLs. Baseline: Goal status: INITIAL   LONG TERM GOALS: 02/08/2025   Patient will increase ABC scale score >80% to demonstrate better functional mobility and better confidence with ADLs.  Baseline: 46.25% Goal status: INITIAL  2.  Patient (> 60 years old) will complete five times sit to stand test in < 15 seconds indicating an increased LE strength and improved balance. Baseline: 16.59s Goal status: INITIAL  3.  Patient will increase Berg Balance score by > 6 points to demonstrate decreased fall risk during functional activities Baseline: 50/56 Goal status: INITIAL  4.  Patient will increase 10 meter walk test to >1.35m/s as to improve gait speed for better community ambulation and to reduce fall risk. Baseline: 0.75 m/s with SPC Goal status: INITIAL  5.  Patient will reduce timed up and go to <11 seconds to reduce fall risk and demonstrate improved transfer/gait ability. Baseline: 8.86s Goal status: INITIAL   6. Pt will increase distance of by 150 ft in order to increase ambulation tolerance in community and public environments.  Baseline: 700' with SPC  Goal status: INITIAL   ASSESSMENT:  CLINICAL IMPRESSION: Patient began today's session with Nustep machine to improve cardiovascular response to exercise and activity tolerance; appreciated heat pad while on nustep. HEP provided to improve ROM in hip structures and reduce tension on low back with hand out provided.  Trendelenburg gait pattern noted L>R, therefore intervention selected to functionally improved glute med activation with HE provided. Will continue to address weakness and balance deficits to reduce fall risk and improve function with ADLs. Pt will benefit from continuing PT at this time to improve on deficits listed above.   OBJECTIVE IMPAIRMENTS: Abnormal gait, decreased balance, decreased coordination, decreased endurance, decreased mobility, difficulty walking, decreased strength, dizziness, increased edema, impaired flexibility, impaired sensation, and pain.   ACTIVITY LIMITATIONS: carrying, lifting, bending, stairs, and transfers  PARTICIPATION LIMITATIONS: cleaning, driving, and yard work  PERSONAL FACTORS: Age, Past/current experiences, Time since onset of injury/illness/exacerbation, and 3+ comorbidities: HTN, anemia, neuropathy, depression, past joint replacement, B LE neuropathy, etc. are also affecting patient's functional outcome.   REHAB  POTENTIAL: Good  CLINICAL DECISION MAKING: Evolving/moderate complexity  EVALUATION COMPLEXITY: Moderate  PLAN:  PT FREQUENCY: 2x/week  PT DURATION: 12 weeks  PLANNED INTERVENTIONS: [id number]- PT Re-evaluation, 97750- Physical Performance Testing, 97110-Therapeutic exercises, 97530- Therapeutic activity, [id number]- Neuromuscular re-education, 97535- Self Care, [id number]- Manual therapy, [id number]- Gait training, [phone]- Orthotic Initial, [phone]- Orthotic/Prosthetic subsequent, [phone]- Canalith repositioning, [id number]- Aquatic Therapy, [phone]- Electrical stimulation (unattended), [phone] (1-2 muscles), [id number] (3+ muscles)- Dry Needling, Patient/Family education, Balance training, Stair training, Joint mobilization, Joint manipulation, Spinal mobilization, Vestibular training, DME instructions, Wheelchair mobility training, Cryotherapy, and Moist heat  PLAN FOR NEXT SESSION:   -Continue working to improve activity tolerance, gait distance, and balance with progressive  exercises to patient's tolerance level.      Massie Dollar PT, DPT  Physical Therapist - [facility]  11:50 AM 11/27/2024    11/27/2024, 10:51 AM        "

## 2024-11-29 ENCOUNTER — Ambulatory Visit: Payer: Medicare (Managed Care) | Admitting: Physical Therapy

## 2024-11-29 DIAGNOSIS — M5415 Radiculopathy, thoracolumbar region: Secondary | ICD-10-CM

## 2024-11-29 DIAGNOSIS — R260 Ataxic gait: Secondary | ICD-10-CM

## 2024-11-29 DIAGNOSIS — R293 Abnormal posture: Secondary | ICD-10-CM

## 2024-11-29 DIAGNOSIS — R2681 Unsteadiness on feet: Secondary | ICD-10-CM

## 2024-11-29 DIAGNOSIS — Z9181 History of falling: Secondary | ICD-10-CM

## 2024-11-29 DIAGNOSIS — R200 Anesthesia of skin: Secondary | ICD-10-CM

## 2024-11-29 NOTE — Therapy (Signed)
 " OUTPATIENT PHYSICAL THERAPY NEURO EVALUATION   Patient Name: Eric Andrews MRN: [id number] DOB:24-Feb-1951, 73 y.o., male Today's Date: 11/29/2024   PCP: Edman Marsa PARAS, DO  REFERRING PROVIDER: Lane Arthea BRAVO, MD   END OF SESSION:  PT End of Session - 11/29/24 0942     Visit Number 5    Number of Visits 24    PT Start Time [phone]    PT Stop Time 1016    PT Time Calculation (min) 39 min    Equipment Utilized During Treatment Gait belt    Activity Tolerance Patient tolerated treatment well    Behavior During Therapy WFL for tasks assessed/performed           Past Medical History:  Diagnosis Date   ADD (attention deficit disorder)    Anemia    Anxiety    Arthritis    Depression    Diabetes mellitus without complication (HCC)    Enlarged prostate    GERD (gastroesophageal reflux disease)    History of kidney stones    Hypertension    Knee pain    Neuromuscular disorder (HCC)    lower legs- neuropathy   Sleep apnea    Past Surgical History:  Procedure Laterality Date   APPENDECTOMY     CARPEL TUNEL Bilateral    COLONOSCOPY W/ POLYPECTOMY     COLONOSCOPY WITH PROPOFOL  N/A 03/22/2023   Procedure: COLONOSCOPY WITH PROPOFOL ;  Surgeon: Therisa Bi, MD;  Location: [facility] ENDOSCOPY;  Service: Gastroenterology;  Laterality: N/A;   ESOPHAGOGASTRODUODENOSCOPY (EGD) WITH PROPOFOL  N/A 08/18/2023   Procedure: ESOPHAGOGASTRODUODENOSCOPY (EGD) WITH PROPOFOL ;  Surgeon: Therisa Bi, MD;  Location: [facility] ENDOSCOPY;  Service: Gastroenterology;  Laterality: N/A;   HERNIA REPAIR     Left   JOINT REPLACEMENT     KNEE ARTHROSCOPY     Left   RIGHT WRIST SX X2     TOTAL KNEE ARTHROPLASTY Left 04/21/2022   Procedure: TOTAL KNEE ARTHROPLASTY;  Surgeon: Kathlynn Sharper, MD;  Location: [facility] ORS;  Service: Orthopedics;  Laterality: Left;   TOTAL SHOULDER ARTHROPLASTY Left 01/05/2023   Procedure: Left total shoulder arthroplasty, biceps tenodesis;  Surgeon: Tobie Priest, MD;  Location:  [facility] ORS;  Service: Orthopedics;  Laterality: Left;   vascetomy     Patient Active Problem List   Diagnosis Date Noted   Atherosclerosis of aorta 01/03/2024   OSA on CPAP 10/29/2023   Abdominal pain, epigastric 08/18/2023   Colon cancer screening 03/22/2023   Adenomatous polyp of colon 03/22/2023   Osteoarthritis of glenohumeral joint, left 01/20/2023   Status post total replacement of left shoulder 01/20/2023   S/P TKR (total knee replacement) using cement, left 04/21/2022   Bilateral primary osteoarthritis of knee 12/17/2021   Gastroesophageal reflux disease without esophagitis 07/08/2021   Environmental and seasonal allergies 02/27/2021   Hyperlipidemia associated with type 2 diabetes mellitus (HCC) 02/27/2021   Morbid obesity with BMI of 45.0-49.9, adult (HCC) 06/26/2020   Major depressive disorder, recurrent, moderate (HCC) 06/25/2020   GAD (generalized anxiety disorder) 06/25/2020   Type 2 diabetes mellitus with other specified complication (HCC) 05/27/2020   Chronic pain of both knees 05/27/2020   Primary osteoarthritis involving multiple joints 05/27/2020   Cataract associated with type 2 diabetes mellitus (HCC) 05/27/2020   History of torn meniscus of left knee 05/27/2020   DDD (degenerative disc disease), lumbar 05/27/2020    ONSET DATE: about 3 years ago, one year after he retired from working as a heavy research scientist (physical sciences)  REFERRING DIAG:  R26.89 (ICD-10-CM) - Imbalance    THERAPY DIAG:  Abnormal posture  Ataxic gait  History of falling  Unsteadiness on feet  Radiculopathy, thoracolumbar region  Anesthesia of skin  Rationale for Evaluation and Treatment: Rehabilitation  SUBJECTIVE:                                                                                                                                                                                             SUBJECTIVE STATEMENT: Patient arrived using [facility] for ambulation. States that he feels  like this is working. Will be traveling to [place]  over the weekend.   Pt accompanied by: self  PERTINENT HISTORY: From AMB referral: Patient presenting with lower extremity pain, 6/10 and difficulty walking long distances. Ambulating with cane/ No neck pain or difficulty with cervical rotation. Hand pain is intermittent, wearing wrist braces. Increased unstable gait, imbalance. No recent falls. Difficulty ambulating long distances. Referral to PT with gait and balance deficits.  PAIN:  Are you having pain? Yes: NPRS scale: 2/10 Pain location: B hips Pain description: NA, hard to describe Aggravating factors: walking longer distances, increased activity Relieving factors: during walking, medications, distractions  PRECAUTIONS: None  RED FLAGS: None   WEIGHT BEARING RESTRICTIONS: No  FALLS: Has patient fallen in last 6 months? 3 or 4 near falls in the 6 months, due to his legs giving out  LIVING ENVIRONMENT: Lives with: lives with their family and great grandson, granddaughter, and 3 daughters Lives in: House/apartment Stairs: Yes: Internal: one flight, only uses if he needs to do a job on second floor, but able to use stairs slowly steps; on left going up; one side of the house has a ramp which he mostly uses, and one side with steps Has following equipment at home: Single point cane, Walker - 2 wheeled, and [employer] - 4 wheeled, used the walkers in the past following a knee replacement  PLOF: Independent and was able to independently do yard work easier specifically  PATIENT GOALS: Improve balance, walking, LE pain, and back pain  OBJECTIVE:  Note: Objective measures were completed at Evaluation unless otherwise noted.  DIAGNOSTIC FINDINGS: From MRI of lumbar spine 02/13/21: Disc levels:   T12-L1: Mild degenerative change.  Negative for stenosis   L1-2: Mild degenerative change.  Negative for stenosis   L2-3: Moderate disc degeneration with disc space  narrowing and spurring asymmetric to the left. Mild facet degeneration. Mild spinal stenosis. Mild subarticular stenosis bilaterally. Left lateral osteophyte formation. Left foramen mildly narrowed.   L3-4: Disc degeneration with disc space narrowing and spurring asymmetric to the right. Moderate facet hypertrophy bilaterally. Mild  to moderate spinal stenosis. Mild to moderate subarticular and foraminal stenosis on the right. Mild left subarticular stenosis.   L4-5: Disc degeneration with diffuse bulging of the disc and small central disc protrusion. Moderate facet hypertrophy. Mild to moderate spinal stenosis. Moderate subarticular and foraminal stenosis bilaterally.   L5-S1: Disc degeneration with diffuse bulging of the disc. Moderate to advanced facet hypertrophy bilaterally. Moderate subarticular stenosis on the left and mild subarticular stenosis on the right.  IMPRESSION: Multilevel degenerative change throughout the lumbar spine. Multilevel spinal and foraminal stenosis as described above  COGNITION: Overall cognitive status: Within functional limits for tasks assessed   SENSATION: Light touch: Impaired  Tingling on whole L LE  COORDINATION: H<>S: WFL, but limited range  EDEMA: mild swelling in B ankles, wears compression socks which usually cause pain  POSTURE: rounded shoulders, forward head, and flexed trunk   LOWER EXTREMITY MMT:    MMT Right Eval Left Eval  Hip flexion 4- 41  Hip extension    Hip abduction 4- 4-  Hip adduction 5 5  Hip internal rotation    Hip external rotation    Knee flexion 5 5  Knee extension 5 5  Ankle dorsiflexion 5 5  Ankle plantarflexion    Ankle inversion    Ankle eversion    (Blank rows = not tested)   -Lumbar ROM: Flexion: fingertips to mid shin, no pain Rotation: around 50* B, no pain Extension: WFL    RAMP:  Findings: uses ramp at home, says it easier than steps   STAIRS: Not tested GAIT: Findings: Gait  Characteristics: decreased arm swing- Right, decreased arm swing- Left, decreased step length- Right, decreased step length- Left, lateral hip instability, and wide BOS and Assistive device utilized:Single point cane  FUNCTIONAL TESTS:   Pt performed 5 time sit<>stand (5xSTS): 16.59 sec (>15 sec indicates increased fall risk)    PT instructed pt in TUG: 8.86s sec (average of 3 trials; >13.5 sec indicates increased fall risk)     PATIENT SURVEYS:   ABC Scale: 46.25%                                                                                                                              TREATMENT DATE: 11/29/2024  TA and TE for improved strengtheing, ROM and safety with functional daily activities:   -NuStep: rolling [place] - Levels 3-6 x6'. Hot pack in place throughout entire 6 min.   Seated piriformis stretch 2 x 20 sec bil  Seated HS stretch 2 x 20 sec bil  Standing hip flexor stretch on second step 2 x 15 sec bil   Standing truck extension x   Reciprocal foot tap on 6 inch step without UE support x 12 bil   Side stepping up/down 6inch step x 10 bil with light UE support at rail   Seated hip abduction GTB 2x 12  Sit<>stand with 15# kettle bell 2x 10 .   Unilateral Suitcase  carry with 15# kettle x 144ft; performed with kettle bell in each UE.     PATIENT EDUCATION: Education details: purpose of PT, possible causes of LE sensation change and weakness due to pt lumbar spine degenerative changes Person educated: Patient Education method: Explanation Education comprehension: verbalized understanding  HOME EXERCISE PROGRAM: Access Code: [email or web address] URL: [email or web address] Date: 11/27/2024 Prepared by: Massie Dollar  Exercises - Seated Piriformis Stretch  - 1 x daily - 7 x weekly - 3 sets - 10 reps - 20 sec  hold - Seated Hamstring Stretch  - 1 x daily - 7 x weekly - 3 sets - 10 reps - 20 sec hold - Standing Hip Flexor Stretch  - 1 x daily - 7 x weekly  - 3 sets - 10 reps - 20 sec  hold - Side Stepping with Resistance at Thighs and Counter Support  - 1 x daily - 4 x weekly - 3 sets - 5 reps - Seated Hip Abduction with Resistance  - 1 x daily - 7 x weekly - 3 sets - 10 reps  GOALS: Goals reviewed with patient? No   SHORT TERM GOALS: Target date: 12/14/2024   Patient will be independent in home exercise program to improve strength/mobility for better functional independence with ADLs. Baseline: Goal status: INITIAL   LONG TERM GOALS: 02/08/2025   Patient will increase ABC scale score >80% to demonstrate better functional mobility and better confidence with ADLs.  Baseline: 46.25% Goal status: INITIAL  2.  Patient (> 59 years old) will complete five times sit to stand test in < 15 seconds indicating an increased LE strength and improved balance. Baseline: 16.59s Goal status: INITIAL  3.  Patient will increase Berg Balance score by > 6 points to demonstrate decreased fall risk during functional activities Baseline: 50/56 Goal status: INITIAL  4.  Patient will increase 10 meter walk test to >1.48m/s as to improve gait speed for better community ambulation and to reduce fall risk. Baseline: 0.75 m/s with SPC Goal status: INITIAL  5.  Patient will reduce timed up and go to <11 seconds to reduce fall risk and demonstrate improved transfer/gait ability. Baseline: 8.86s Goal status: INITIAL   6. Pt will increase distance of by 150 ft in order to increase ambulation tolerance in community and public environments.  Baseline: 700' with SPC  Goal status: INITIAL   ASSESSMENT:  CLINICAL IMPRESSION: Patient began today's session with Nustep machine to improve cardiovascular response to exercise and activity tolerance; PT treatment reviewed hip complex stretches with plans to travel to [place]  over the weekend, and pt worried about pain a stiffness through long car ride. Continued to address gluteal and core weakness with functional  movement training including weighted and and sit to stands, as well as SLS and lateral step ups. Tolerated all interventions well with no complaints of increased pain throughout session.  Will continue to address weakness and balance deficits to reduce fall risk and improve function with ADLs. Pt will benefit from continuing PT at this time to improve on deficits listed above.   OBJECTIVE IMPAIRMENTS: Abnormal gait, decreased balance, decreased coordination, decreased endurance, decreased mobility, difficulty walking, decreased strength, dizziness, increased edema, impaired flexibility, impaired sensation, and pain.   ACTIVITY LIMITATIONS: carrying, lifting, bending, stairs, and transfers  PARTICIPATION LIMITATIONS: cleaning, driving, and yard work  PERSONAL FACTORS: Age, Past/current experiences, Time since onset of injury/illness/exacerbation, and 3+ comorbidities: HTN, anemia, neuropathy, depression, past joint replacement, B LE neuropathy, etc. are also affecting patient's  functional outcome.   REHAB POTENTIAL: Good  CLINICAL DECISION MAKING: Evolving/moderate complexity  EVALUATION COMPLEXITY: Moderate  PLAN:  PT FREQUENCY: 2x/week  PT DURATION: 12 weeks  PLANNED INTERVENTIONS: 97164- PT Re-evaluation, 97750- Physical Performance Testing, 97110-Therapeutic exercises, 97530- Therapeutic activity, 97112- Neuromuscular re-education, 97535- Self Care, [id number]- Manual therapy, [phone]- Gait training, [phone]- Orthotic Initial, [phone]- Orthotic/Prosthetic subsequent, [phone]- Canalith repositioning, [id number]- Aquatic Therapy, [phone]- Electrical stimulation (unattended), [phone] (1-2 muscles), [phone] (3+ muscles)- Dry Needling, Patient/Family education, Balance training, Stair training, Joint mobilization, Joint manipulation, Spinal mobilization, Vestibular training, DME instructions, Wheelchair mobility training, Cryotherapy, and Moist heat  PLAN FOR NEXT SESSION:   -Continue working to improve activity  tolerance, gait distance, and balance with progressive exercises to patient's tolerance level.  - address gluteal and core weakness    Massie Dollar PT, DPT  Physical Therapist - [facility]  9:47 AM 11/29/2024    11/29/2024, 9:47 AM        "

## 2024-12-03 DIAGNOSIS — G4733 Obstructive sleep apnea (adult) (pediatric): Secondary | ICD-10-CM | POA: Diagnosis not present

## 2024-12-04 ENCOUNTER — Other Ambulatory Visit: Payer: Medicare (Managed Care) | Admitting: Pharmacist

## 2024-12-04 DIAGNOSIS — E1169 Type 2 diabetes mellitus with other specified complication: Secondary | ICD-10-CM

## 2024-12-04 DIAGNOSIS — Z7985 Long-term (current) use of injectable non-insulin antidiabetic drugs: Secondary | ICD-10-CM

## 2024-12-04 DIAGNOSIS — Z794 Long term (current) use of insulin: Secondary | ICD-10-CM

## 2024-12-04 NOTE — Progress Notes (Addendum)
 "  12/04/2024 Name: Eric Andrews MRN: [id number] DOB: 02/25/1951  Chief Complaint  Patient presents with   Medication Management   Medication Assistance    Eric Andrews is a 73 y.o. year old male who presented for a telephone visit.   They were referred to the pharmacist by their PCP for assistance in managing medication access.      Subjective:   Care Team: Primary Care Provider: Edman Marsa PARAS, DO; Next Scheduled Visit: 02/27/2025 Urology: Twylla Glendia BROCKS, MD Neurology: Lane Arthea Locus, MD [facility] Pulmonology: Tamea Dedra CROME, MD  Medication Access/Adherence  Current Pharmacy:  [facility] [phone] [place], [place] - [address] AT [facility] [address] [place] [address] [place] [address] Phone: [phone] Fax: [phone]  [id number] [place] - [address] AT [facility] OF [address] [facility] [place] [address] Phone: [phone] Fax: [phone]  [facility] - [place], [place] - [address] [place] [address] Phone: [phone] Fax: [phone]  Patient reports affordability concerns with their medications: No  Patient reports access/transportation concerns to their pharmacy: No  Patient reports adherence concerns with their medications:  No    Reports signed up for [employer] Medicare plan for 2026   Diabetes:   Current medications:  Tresiba  50 units daily in evening Novolog  10 units three times daily with meals.  Patient self-adjusted down if needed based on blood sugar  Metformin  ER 500 mg - 2 tablets (1000 mg) twice daily  Farxiga  10 mg daily  Ozempic  1 mg weekly    Patient using Freestyle Libre 3 Plus Continuous Glucose. Connect today via Libreview and review results:   Date of Download: 12/04/2024 % Time CGM is active: 97% Average Glucose: 160 mg/dL Glucose Management  Indicator: 7.1%  Glucose Variability: 19.2% (goal <36%) Time in Goal:  - Time in range 70-180: 77% - Time above range: 23% - Time below range: 0%        Patient denies hypoglycemic s/sx including dizziness, shakiness, sweating - Reports carries glucose tablets in case needed to treat low blood sugar   Notes that he needs to cut back on desserts/sweets   Statin: atorvastatin  20 mg daily    Current medication access support:  Patient enrolled in assistance program from [employer] for Novolog , Tresiba  and pen needles through 12/06/2025 [employer] will no longer offer Ozempic  to Medicare beneficiaries through their Patient Assistance Program in 2026.  Reports has ~1 month supply remaining Patient collaborating with CPhT Suzen Mall for assistant with applying for re-enrollment in patient assistance for Farxiga  from [employer] for 2026 Plans to follow up with CPhT as thinks that there is a mismatch between his household size/income in way that he completed current application Reports currently has >6 month supply of Farxiga  remaining   Objective:  Lab Results  Component Value Date   HGBA1C 7.1 (A) 08/24/2024    Lab Results  Component Value Date   CREATININE 0.85 02/14/2024   BUN 25 02/14/2024   NA 142 02/14/2024   K 4.0 02/14/2024   CL 101 02/14/2024   CO2 34 (H) 02/14/2024    Lab Results  Component Value Date   CHOL 116 02/14/2024   HDL 35 (L) 02/14/2024   LDLCALC 56 02/14/2024   TRIG 177 (H) 02/14/2024   CHOLHDL 3.3 02/14/2024  Medications Reviewed Today     Reviewed by Alana Sharyle LABOR, RPH-CPP (Pharmacist) on 12/04/24 at 1412  Med List Status: <None>   Medication Order Taking? Sig Documenting Provider Last Dose Status Informant  Ascorbic Acid  (VITAMIN C) 1000 MG tablet [id number]  Take 1,000 mg by mouth daily. [provider]  Active Self  aspirin  EC 81 MG tablet [id number]  Take 81 mg by mouth daily. Swallow whole. [provider]   Active   atorvastatin  (LIPITOR) 20 MG tablet [id number]  Take 1 tablet (20 mg total) by mouth daily. Edman Marsa PARAS, DO  Active   busPIRone  (BUSPAR ) 15 MG tablet [id number]  Take 15 mg by mouth daily. [provider]  Active   celecoxib  (CELEBREX ) 200 MG capsule [id number]  TAKE 1 CAPSULE(200 MG) BY MOUTH TWICE DAILY Karamalegos, Marsa PARAS, DO  Active   cetirizine  (ZYRTEC ) 10 MG tablet [id number]  TAKE 1 TABLET(10 MG) BY MOUTH AT BEDTIME Edman Marsa PARAS, DO  Active   chlorthalidone  (HYGROTON ) 25 MG tablet [id number]  TAKE 1 TABLET(25 MG) BY MOUTH DAILY Karamalegos, Marsa PARAS, DO  Active   cholecalciferol  (VITAMIN D3) 25 MCG (1000 UNIT) tablet [id number]  Take 1,000 Units by mouth in the morning and at bedtime. [provider]  Active Self  Continuous Glucose Sensor (FREESTYLE LIBRE 3 PLUS SENSOR) MISC [id number]  Place 1 sensor on the skin every 15 days. Use to check glucose continuously Edman Marsa PARAS, DO  Active   dicyclomine  (BENTYL ) 10 MG capsule [id number]  TAKE 1 CAPSULE(10 MG) BY MOUTH FOUR TIMES DAILY BEFORE MEALS AND AT BEDTIME AS NEEDED Edman Marsa PARAS, DO  Active   FARXIGA  10 MG TABS tablet [id number] Yes Take 1 tablet (10 mg total) by mouth daily before breakfast. Edman Marsa PARAS, DO  Active   fenofibrate  micronized (LOFIBRA) 134 MG capsule [id number]  TAKE 1 CAPSULE(134 MG) BY MOUTH DAILY BEFORE BREAKFAST Karamalegos, Marsa PARAS, DO  Active   finasteride  (PROSCAR ) 5 MG tablet [id number]  TAKE 1 TABLET(5 MG) BY MOUTH EVERY EVENING Karamalegos, Marsa PARAS, DO  Active   fluticasone  (FLONASE ) 50 MCG/ACT nasal spray [id number]  SHAKE LIQUID AND USE 2 SPRAYS IN EACH NOSTRIL EVERY DAY Karamalegos, Marsa PARAS, DO  Active   gabapentin  (NEURONTIN ) 600 MG tablet [id number]  Take 600 mg by mouth in the morning, at noon, in the evening, and at bedtime. Taking in morning, evening and middle of the night  Patient taking differently: Take 600 mg  by mouth 3 (three) times daily. Taking in morning, evening and middle of the night   [provider]  Expired 10/14/24 2359 Self           Med Note SOILA LYLE BROCKS   Tue Apr 07, 2022  5:10 PM)    hydrocortisone  2.5 % cream [id number]  APPLY TOPICALLY TO THE AFFECTED AREA OF FACE AND EARS 3 DAYS A WEEK ON MONDAYS, WEDNESDAYS, FRIDAYS Kowalski, David C, MD  Active   insulin  degludec (TRESIBA  FLEXTOUCH) 100 UNIT/ML FlexTouch Pen [id number]  Inject 68 Units into the skin at bedtime.  Patient taking differently: Inject 50 Units into the skin every evening.   Edman Marsa PARAS, DO  Active Self           Med Note [place], Jessilynn Taft A   Fri Oct 15, 2023 11:20 AM)    Insulin  Pen Needle 31G X 5 MM MISC [id number]  Use with insulin  to inject into skin 3 times daily as directed Edman Marsa PARAS,  DO  Active   ketoconazole  (NIZORAL ) 2 % cream [id number]  APPLY EXTERNALLY TO THE AFFECTED AREA OF FACE AND EARS 3 days a week on TUESDAY, THURSDAY, AND SATURDAY Hester Alm BROCKS, MD  Active   ketoconazole  (NIZORAL ) 2 % shampoo [id number]  apply 2 to 3 times per week, massage into scalp and leave in for 5 minutes before rinsing out Hester Alm BROCKS, MD  Active   losartan  (COZAAR ) 50 MG tablet [id number]  TAKE 1 TABLET(50 MG) BY MOUTH DAILY Edman, Marsa PARAS, DO  Active   metFORMIN  (GLUCOPHAGE -XR) 500 MG 24 hr tablet [id number] Yes Take 2 tablets (1,000 mg total) by mouth 2 (two) times daily with a meal. Edman, Marsa PARAS, DO  Active   Multiple Vitamin (MULTIVITAMIN) capsule [id number]  Take 1 capsule by mouth daily. [provider]  Active Self  NOVOLOG  FLEXPEN 100 UNIT/ML FlexPen [id number]  Inject 12-14 Units into the skin 2 (two) times daily. Sliding scale  Patient taking differently: Inject 10 Units into the skin 3 (three) times daily with meals. Sliding scale   Edman Marsa PARAS, DO  Active Self  Omega-3 Fatty Acids (FISH OIL) 1200 MG CAPS [id number]  Take 1,200 mg by  mouth in the morning and at bedtime. [provider]  Active Self  omeprazole  (PRILOSEC) 20 MG capsule [id number]  TAKE 1 CAPSULE BY MOUTH DAILY BEFORE BREAKFAST Karamalegos, Marsa PARAS, DO  Active   OZEMPIC , 1 MG/DOSE, 4 MG/3ML SOPN [id number] Yes Inject 1 mg into the skin once a week. Edman Marsa PARAS, DO  Active Self  pregabalin (LYRICA) 50 MG capsule [id number]  Take 50 mg by mouth in the morning, at noon, in the evening, and at bedtime. [provider]  Active   QUEtiapine (SEROQUEL) 25 MG tablet [id number]  Take 25 mg by mouth at bedtime. [provider]  Active   tamsulosin  (FLOMAX ) 0.4 MG CAPS capsule [id number]  TAKE 1 CAPSULE BY MOUTH EVERY NIGHT AT BEDTIME FOR URINE RETENTION Edman Marsa PARAS, DO  Active   traMADol  (ULTRAM ) 50 MG tablet [id number]  Take 50 mg by mouth 3 (three) times daily as needed. [provider]  Active            Med Note [place], [facility] A   Fri Oct 15, 2023 10:58 AM)    traZODone  (DESYREL ) 100 MG tablet [id number]  Take 1.5 tablets (150 mg total) by mouth at bedtime. Edman Marsa PARAS, DO  Active   venlafaxine  XR (EFFEXOR -XR) 150 MG 24 hr capsule [id number]  TAKE 1 CAPSULE(150 MG) BY MOUTH DAILY WITH BREAKFAST Karamalegos, Marsa PARAS, DO  Active               Assessment/Plan:   Review with patient that per [employer] website, the [employer] HMO plan has a $295 annual prescription deductible (impacting tiers 3-5) and tier 3 copayment of $47/month (for medications such as Ozempic ) in 2026. Note out of pocket prescription drug cost in 2026 is capped at $2,100.  Diabetes: - Currently controlled - Reviewed dietary modifications including importance of having regular well-balanced meals and snacks throughout the day, while controlling carbohydrate portion sizes              Discuss ideas for balanced night time snacks - Recommend to continue to use Freestyle Libre 3 CGM to  monitor blood sugar/as feedback on dietary choices Recommend to check glucose with fingerstick check when needed for symptoms and as back up to CGM.  Patient to  contact office if needed for readings outside of established parameters or symptoms - Patient to follow up with [employer] patient assistance as needed for refills of Farxiga  and with [employer] patient assistance as needed for refills of Novolog , Tresiba  and pen needles - Patient collaborating with CPhT Suzen Mall for assistant with applying for re-enrollment in patient assistance for Farxiga  from [employer] for 2026 Plans to follow up with CPhT as thinks that there is a mismatch between his household size/income on current application   Follow Up Plan:  Clinical Pharmacist will follow up with patient by telephone on 01/01/2025 at 1:30 PM    Sharyle Sia, PharmD, JAQUELINE, CPP Clinical Pharmacist [facility] [phone]   "

## 2024-12-04 NOTE — Patient Instructions (Signed)
 Goals Addressed             This Visit's Progress    Pharmacy Goals       If you need to reach out to patient assistance programs regarding refills or to find out the status of your application, you can do so by calling:  [employer] at [phone] [employer] at [phone]  Our goal A1c is less than 7%. This corresponds with fasting sugars less than 130 and 2 hour after meal sugars less than 180. Please check your blood sugar and keep a log of the results  Please check your home blood pressure, keep a log of the results and bring this with you to your medical appointments.  Our goal bad cholesterol, or LDL, is less than 70 . This is why it is important to continue taking your atorvastatin.  Estelle Grumbles, PharmD, [facility] Clinical Pharmacist  [facility] [phone]

## 2024-12-05 ENCOUNTER — Ambulatory Visit: Payer: Medicare (Managed Care)

## 2024-12-05 DIAGNOSIS — R260 Ataxic gait: Secondary | ICD-10-CM

## 2024-12-05 DIAGNOSIS — Z9181 History of falling: Secondary | ICD-10-CM

## 2024-12-05 DIAGNOSIS — R2681 Unsteadiness on feet: Secondary | ICD-10-CM

## 2024-12-05 DIAGNOSIS — R293 Abnormal posture: Secondary | ICD-10-CM | POA: Diagnosis not present

## 2024-12-05 NOTE — Addendum Note (Signed)
 Addended by: Almeda Ezra E on: 12/05/2024 09:41 AM   Modules accepted: Orders

## 2024-12-05 NOTE — Therapy (Signed)
 " OUTPATIENT PHYSICAL THERAPY TREATMENT  Patient Name: Eric Andrews MRN: [id number] DOB:1951-10-03, 73 y.o., male Today's Date: 12/05/2024   PCP: Edman Marsa PARAS, DO  REFERRING PROVIDER: Lane Arthea BRAVO, MD   END OF SESSION:  PT End of Session - 12/05/24 0932     Visit Number 6    Number of Visits 24    Date for Recertification  02/08/25    Authorization Type [employer]    Authorization Time Period 11/16/24-02/08/2025    Progress Note Due on Visit 10    PT Start Time 0929    PT Stop Time 1010    PT Time Calculation (min) 41 min    Equipment Utilized During Treatment Gait belt    Activity Tolerance Patient tolerated treatment well;No increased pain    Behavior During Therapy WFL for tasks assessed/performed           Past Medical History:  Diagnosis Date   ADD (attention deficit disorder)    Anemia    Anxiety    Arthritis    Depression    Diabetes mellitus without complication (HCC)    Enlarged prostate    GERD (gastroesophageal reflux disease)    History of kidney stones    Hypertension    Knee pain    Neuromuscular disorder (HCC)    lower legs- neuropathy   Sleep apnea    Past Surgical History:  Procedure Laterality Date   APPENDECTOMY     CARPEL TUNEL Bilateral    COLONOSCOPY W/ POLYPECTOMY     COLONOSCOPY WITH PROPOFOL  N/A 03/22/2023   Procedure: COLONOSCOPY WITH PROPOFOL ;  Surgeon: Therisa Bi, MD;  Location: [facility] ENDOSCOPY;  Service: Gastroenterology;  Laterality: N/A;   ESOPHAGOGASTRODUODENOSCOPY (EGD) WITH PROPOFOL  N/A 08/18/2023   Procedure: ESOPHAGOGASTRODUODENOSCOPY (EGD) WITH PROPOFOL ;  Surgeon: Therisa Bi, MD;  Location: [facility] ENDOSCOPY;  Service: Gastroenterology;  Laterality: N/A;   HERNIA REPAIR     Left   JOINT REPLACEMENT     KNEE ARTHROSCOPY     Left   RIGHT WRIST SX X2     TOTAL KNEE ARTHROPLASTY Left 04/21/2022   Procedure: TOTAL KNEE ARTHROPLASTY;  Surgeon: Kathlynn Sharper, MD;  Location: [facility] ORS;  Service: Orthopedics;   Laterality: Left;   TOTAL SHOULDER ARTHROPLASTY Left 01/05/2023   Procedure: Left total shoulder arthroplasty, biceps tenodesis;  Surgeon: Tobie Priest, MD;  Location: [facility] ORS;  Service: Orthopedics;  Laterality: Left;   vascetomy     Patient Active Problem List   Diagnosis Date Noted   Atherosclerosis of aorta 01/03/2024   OSA on CPAP 10/29/2023   Abdominal pain, epigastric 08/18/2023   Colon cancer screening 03/22/2023   Adenomatous polyp of colon 03/22/2023   Osteoarthritis of glenohumeral joint, left 01/20/2023   Status post total replacement of left shoulder 01/20/2023   S/P TKR (total knee replacement) using cement, left 04/21/2022   Bilateral primary osteoarthritis of knee 12/17/2021   Gastroesophageal reflux disease without esophagitis 07/08/2021   Environmental and seasonal allergies 02/27/2021   Hyperlipidemia associated with type 2 diabetes mellitus (HCC) 02/27/2021   Morbid obesity with BMI of 45.0-49.9, adult (HCC) 06/26/2020   Major depressive disorder, recurrent, moderate (HCC) 06/25/2020   GAD (generalized anxiety disorder) 06/25/2020   Type 2 diabetes mellitus with other specified complication (HCC) 05/27/2020   Chronic pain of both knees 05/27/2020   Primary osteoarthritis involving multiple joints 05/27/2020   Cataract associated with type 2 diabetes mellitus (HCC) 05/27/2020   History of torn meniscus of left knee 05/27/2020  DDD (degenerative disc disease), lumbar 05/27/2020    ONSET DATE: about 3 years ago, one year after he retired from working as a heavy research scientist (physical sciences)  REFERRING DIAG:   R26.89 (ICD-10-CM) - Imbalance    THERAPY DIAG:  Abnormal posture  Ataxic gait  History of falling  Unsteadiness on feet  Rationale for Evaluation and Treatment: Rehabilitation  SUBJECTIVE:                                                                                                                                                                                              SUBJECTIVE STATEMENT: Pt drove ~20 hours in 3 days last week, was very stiff, elevated pain.  PERTINENT HISTORY: From AMB referral: Patient presenting with lower extremity pain, 6/10 and difficulty walking long distances. Ambulating with cane/ No neck pain or difficulty with cervical rotation. Hand pain is intermittent, wearing wrist braces. Increased unstable gait, imbalance. No recent falls. Difficulty ambulating long distances. Referral to PT with gait and balance deficits.  PAIN:  Are you having pain? 3-4/10   PRECAUTIONS: None . RED FLAGS: None   WEIGHT BEARING RESTRICTIONS: No  FALLS: Has patient fallen in last 6 months? 3 or 4 near falls in the 6 months, due to his legs giving out  LIVING ENVIRONMENT: Lives with: lives with their family and great grandson, granddaughter, and 3 daughters Lives in: House/apartment Stairs: Yes: Internal: one flight, only uses if he needs to do a job on second floor, but able to use stairs slowly steps; on left going up; one side of the house has a ramp which he mostly uses, and one side with steps Has following equipment at home: Single point cane, Walker - 2 wheeled, and [employer] - 4 wheeled, used the walkers in the past following a knee replacement  PLOF: Independent and was able to independently do yard work easier specifically  PATIENT GOALS: Improve balance, walking, LE pain, and back pain  OBJECTIVE:  Note: Objective measures were completed at Evaluation unless otherwise noted.  DIAGNOSTIC FINDINGS: From MRI of lumbar spine 02/13/21: Disc levels:   T12-L1: Mild degenerative change.  Negative for stenosis   L1-2: Mild degenerative change.  Negative for stenosis   L2-3: Moderate disc degeneration with disc space narrowing and spurring asymmetric to the left. Mild facet degeneration. Mild spinal stenosis. Mild subarticular stenosis bilaterally. Left lateral osteophyte formation. Left foramen mildly narrowed.    L3-4: Disc degeneration with disc space narrowing and spurring asymmetric to the right. Moderate facet hypertrophy bilaterally. Mild to moderate spinal stenosis. Mild to moderate subarticular and foraminal stenosis on the right. Mild left  subarticular stenosis.   L4-5: Disc degeneration with diffuse bulging of the disc and small central disc protrusion. Moderate facet hypertrophy. Mild to moderate spinal stenosis. Moderate subarticular and foraminal stenosis bilaterally.   L5-S1: Disc degeneration with diffuse bulging of the disc. Moderate to advanced facet hypertrophy bilaterally. Moderate subarticular stenosis on the left and mild subarticular stenosis on the right.  IMPRESSION: Multilevel degenerative change throughout the lumbar spine. Multilevel spinal and foraminal stenosis as described above  COGNITION: Overall cognitive status: Within functional limits for tasks assessed   SENSATION: Light touch: Impaired  Tingling on whole L LE  COORDINATION: H<>S: WFL, but limited range  EDEMA: mild swelling in B ankles, wears compression socks which usually cause pain  POSTURE: rounded shoulders, forward head, and flexed trunk   LOWER EXTREMITY MMT:    MMT Right Eval Left Eval  Hip flexion 4- 41  Hip abduction 4- 4-  Hip adduction 5 5  Hip internal rotation    Hip external rotation    Knee flexion 5 5  Knee extension 5 5  Ankle dorsiflexion 5 5  Ankle plantarflexion    Ankle inversion    Ankle eversion    (Blank rows = not tested)  -Lumbar ROM: Flexion: fingertips to mid shin, no pain Rotation: around 50* B, no pain Extension: WFL   RAMP:  Findings: uses ramp at home, says it easier than steps  STAIRS: Not tested GAIT: Findings: Gait Characteristics: decreased arm swing- Right, decreased arm swing- Left, decreased step length- Right, decreased step length- Left, lateral hip instability, and wide BOS and Assistive device utilized:Single point  cane  FUNCTIONAL TESTS:  Pt performed 5 time sit<>stand (5xSTS): 16.59 sec (>15 sec indicates increased fall risk)   PT instructed pt in TUG: 8.86s sec (average of 3 trials; >13.5 sec indicates increased fall risk)    PATIENT SURVEYS:  ABC Scale: 46.25%                                                                                                                             TREATMENT DATE: 12/05/2024 -NuStep: [place] - Levels 3-6 x6'. Hot pack in place throughout entire 6 min.  -Seated piriformis stretch 2 x 20 sec bil  -Seated HS stretch 2 x 20 sec bil  -Standing hip flexor stretch on second step 3 x 15 sec bil  -repeat back extension 10x5secH -reciprocal foot tap on 6 inch step without UE support x 12 bil  -side stepping in // bars with UE support, 4x86ft bilat with aerobic 4 step long ways -seated GTB calm 1x12  -sit<>stand with 15# kettle bell 1x10  -side stepping in // bars with UE support, 4x72ft bilat with aerobic 6 step long ways -seated GTB calm 1x12  -sit<>stand with 15# kettle bell 1x10    PATIENT EDUCATION: Education details: purpose of PT, possible causes of LE sensation change and weakness due to pt lumbar spine degenerative changes Person educated: Patient Education method:  Explanation Education comprehension: verbalized understanding  HOME EXERCISE PROGRAM: Access Code: [email or web address] URL: [email or web address] Date: 11/27/2024 Prepared by: Massie Dollar  Exercises - Seated Piriformis Stretch  - 1 x daily - 7 x weekly - 3 sets - 10 reps - 20 sec  hold - Seated Hamstring Stretch  - 1 x daily - 7 x weekly - 3 sets - 10 reps - 20 sec hold - Standing Hip Flexor Stretch  - 1 x daily - 7 x weekly - 3 sets - 10 reps - 20 sec  hold - Side Stepping with Resistance at Thighs and Counter Support  - 1 x daily - 4 x weekly - 3 sets - 5 reps - Seated Hip Abduction with Resistance  - 1 x daily - 7 x weekly - 3 sets - 10 reps  GOALS: Goals reviewed with  patient? No   SHORT TERM GOALS: Target date: 12/14/2024  Patient will be independent in home exercise program to improve strength/mobility for better functional independence with ADLs. Baseline: Goal status: INITIAL  LONG TERM GOALS: 02/08/2025  Patient will increase ABC scale score >80% to demonstrate better functional mobility and better confidence with ADLs.  Baseline: 46.25% Goal status: INITIAL  2.  Patient (> 37 years old) will complete five times sit to stand test in < 15 seconds indicating an increased LE strength and improved balance. Baseline: 16.59s Goal status: INITIAL  3.  Patient will increase Berg Balance score by > 6 points to demonstrate decreased fall risk during functional activities Baseline: 50/56 Goal status: INITIAL  4.  Patient will increase 10 meter walk test to >1.42m/s as to improve gait speed for better community ambulation and to reduce fall risk. Baseline: 0.75 m/s with SPC Goal status: INITIAL  5.  Patient will reduce timed up and go to <11 seconds to reduce fall risk and demonstrate improved transfer/gait ability. Baseline: 8.86s Goal status: INITIAL   6. Pt will increase distance of by 150 ft in order to increase ambulation tolerance in community and public environments.  Baseline: 700' with SPC  Goal status: INITIAL  ASSESSMENT:  CLINICAL IMPRESSION: Continued with moderate exercises, ROM, muscle lengthening. Pt has reduced pain during session. Patient will benefit from skilled physical therapy intervention to reduce deficits and impairments identified in evaluation, in order to reduce pain, improve quality of life, and maximize activity tolerance for ADL, IADL, and leisure/fitness. Physical therapy will help pt achieve long and short term goals of care.   OBJECTIVE IMPAIRMENTS: Abnormal gait, decreased balance, decreased coordination, decreased endurance, decreased mobility, difficulty walking, decreased strength, dizziness, increased edema,  impaired flexibility, impaired sensation, and pain.   ACTIVITY LIMITATIONS: carrying, lifting, bending, stairs, and transfers  PARTICIPATION LIMITATIONS: cleaning, driving, and yard work  PERSONAL FACTORS: Age, Past/current experiences, Time since onset of injury/illness/exacerbation, and 3+ comorbidities: HTN, anemia, neuropathy, depression, past joint replacement, B LE neuropathy, etc. are also affecting patient's functional outcome.   REHAB POTENTIAL: Good  CLINICAL DECISION MAKING: Evolving/moderate complexity  EVALUATION COMPLEXITY: Moderate  PLAN:  PT FREQUENCY: 2x/week  PT DURATION: 12 weeks  PLANNED INTERVENTIONS: [id number]- PT Re-evaluation, 97750- Physical Performance Testing, 97110-Therapeutic exercises, 97530- Therapeutic activity, 97112- Neuromuscular re-education, 97535- Self Care, [id number]- Manual therapy, [phone]- Gait training, [phone]- Orthotic Initial, [phone]- Orthotic/Prosthetic subsequent, [phone]- Canalith repositioning, [id number]- Aquatic Therapy, [phone]- Electrical stimulation (unattended), [phone] (1-2 muscles), [phone] (3+ muscles)- Dry Needling, Patient/Family education, Balance training, Stair training, Joint mobilization, Joint manipulation, Spinal mobilization, Vestibular training, DME instructions, Wheelchair mobility training, Cryotherapy, and Moist  heat  PLAN FOR NEXT SESSION:   -Continue working to improve activity tolerance, gait distance, and balance with progressive exercises to patient's tolerance level.  - address gluteal and core weakness  9:42 AM, 12/05/2024 Peggye JAYSON Linear, PT, DPT Physical Therapist - [facility]  Outpatient Physical Therapy- Main Campus [phone]            "

## 2024-12-08 ENCOUNTER — Ambulatory Visit: Payer: Medicare (Managed Care) | Attending: Neurology | Admitting: Physical Therapy

## 2024-12-08 ENCOUNTER — Ambulatory Visit: Payer: Medicare (Managed Care)

## 2024-12-08 DIAGNOSIS — M5415 Radiculopathy, thoracolumbar region: Secondary | ICD-10-CM | POA: Diagnosis present

## 2024-12-08 DIAGNOSIS — R293 Abnormal posture: Secondary | ICD-10-CM | POA: Insufficient documentation

## 2024-12-08 DIAGNOSIS — Z9181 History of falling: Secondary | ICD-10-CM | POA: Insufficient documentation

## 2024-12-08 DIAGNOSIS — R200 Anesthesia of skin: Secondary | ICD-10-CM | POA: Diagnosis present

## 2024-12-08 DIAGNOSIS — R2681 Unsteadiness on feet: Secondary | ICD-10-CM | POA: Insufficient documentation

## 2024-12-08 DIAGNOSIS — R260 Ataxic gait: Secondary | ICD-10-CM | POA: Diagnosis present

## 2024-12-08 NOTE — Therapy (Signed)
 " OUTPATIENT PHYSICAL THERAPY TREATMENT  Patient Name: Eric Andrews MRN: [id number] DOB:March 15, 1951, 74 y.o., male Today's Date: 12/08/2024   PCP: Edman Marsa PARAS, DO  REFERRING PROVIDER: Lane Arthea BRAVO, MD   END OF SESSION:  PT End of Session - 12/08/24 1021     Visit Number 7    Number of Visits 24    Date for Recertification  02/08/25    Authorization Type [place] [employer]    Authorization Time Period 11/16/24-02/08/2025    Progress Note Due on Visit 10    PT Start Time 1021    PT Stop Time 1100    PT Time Calculation (min) 39 min    Equipment Utilized During Treatment Gait belt    Activity Tolerance Patient tolerated treatment well;No increased pain    Behavior During Therapy WFL for tasks assessed/performed           Past Medical History:  Diagnosis Date   ADD (attention deficit disorder)    Anemia    Anxiety    Arthritis    Depression    Diabetes mellitus without complication (HCC)    Enlarged prostate    GERD (gastroesophageal reflux disease)    History of kidney stones    Hypertension    Knee pain    Neuromuscular disorder (HCC)    lower legs- neuropathy   Sleep apnea    Past Surgical History:  Procedure Laterality Date   APPENDECTOMY     CARPEL TUNEL Bilateral    COLONOSCOPY W/ POLYPECTOMY     COLONOSCOPY WITH PROPOFOL  N/A 03/22/2023   Procedure: COLONOSCOPY WITH PROPOFOL ;  Surgeon: Therisa Bi, MD;  Location: [facility] ENDOSCOPY;  Service: Gastroenterology;  Laterality: N/A;   ESOPHAGOGASTRODUODENOSCOPY (EGD) WITH PROPOFOL  N/A 08/18/2023   Procedure: ESOPHAGOGASTRODUODENOSCOPY (EGD) WITH PROPOFOL ;  Surgeon: Therisa Bi, MD;  Location: [facility] ENDOSCOPY;  Service: Gastroenterology;  Laterality: N/A;   HERNIA REPAIR     Left   JOINT REPLACEMENT     KNEE ARTHROSCOPY     Left   RIGHT WRIST SX X2     TOTAL KNEE ARTHROPLASTY Left 04/21/2022   Procedure: TOTAL KNEE ARTHROPLASTY;  Surgeon: Kathlynn Sharper, MD;  Location: [facility] ORS;  Service: Orthopedics;   Laterality: Left;   TOTAL SHOULDER ARTHROPLASTY Left 01/05/2023   Procedure: Left total shoulder arthroplasty, biceps tenodesis;  Surgeon: Tobie Priest, MD;  Location: [facility] ORS;  Service: Orthopedics;  Laterality: Left;   vascetomy     Patient Active Problem List   Diagnosis Date Noted   Atherosclerosis of aorta 01/03/2024   OSA on CPAP 10/29/2023   Abdominal pain, epigastric 08/18/2023   Colon cancer screening 03/22/2023   Adenomatous polyp of colon 03/22/2023   Osteoarthritis of glenohumeral joint, left 01/20/2023   Status post total replacement of left shoulder 01/20/2023   S/P TKR (total knee replacement) using cement, left 04/21/2022   Bilateral primary osteoarthritis of knee 12/17/2021   Gastroesophageal reflux disease without esophagitis 07/08/2021   Environmental and seasonal allergies 02/27/2021   Hyperlipidemia associated with type 2 diabetes mellitus (HCC) 02/27/2021   Morbid obesity with BMI of 45.0-49.9, adult (HCC) 06/26/2020   Major depressive disorder, recurrent, moderate (HCC) 06/25/2020   GAD (generalized anxiety disorder) 06/25/2020   Type 2 diabetes mellitus with other specified complication (HCC) 05/27/2020   Chronic pain of both knees 05/27/2020   Primary osteoarthritis involving multiple joints 05/27/2020   Cataract associated with type 2 diabetes mellitus (HCC) 05/27/2020   History of torn meniscus of left knee 05/27/2020  DDD (degenerative disc disease), lumbar 05/27/2020    ONSET DATE: about 3 years ago, one year after he retired from working as a heavy research scientist (physical sciences)  REFERRING DIAG:   R26.89 (ICD-10-CM) - Imbalance    THERAPY DIAG:  Abnormal posture  Ataxic gait  History of falling  Unsteadiness on feet  Radiculopathy, thoracolumbar region  Anesthesia of skin  Rationale for Evaluation and Treatment: Rehabilitation  SUBJECTIVE:                                                                                                                                                                                              SUBJECTIVE STATEMENT: Pt reports that he is doing well. A little stiff from working on car yesterday, but no other complaints.   PERTINENT HISTORY: From AMB referral: Patient presenting with lower extremity pain, 6/10 and difficulty walking long distances. Ambulating with cane/ No neck pain or difficulty with cervical rotation. Hand pain is intermittent, wearing wrist braces. Increased unstable gait, imbalance. No recent falls. Difficulty ambulating long distances. Referral to PT with gait and balance deficits.  PAIN:  Are you having pain? 3-4/10   PRECAUTIONS: None . RED FLAGS: None   WEIGHT BEARING RESTRICTIONS: No  FALLS: Has patient fallen in last 6 months? 3 or 4 near falls in the 6 months, due to his legs giving out  LIVING ENVIRONMENT: Lives with: lives with their family and great grandson, granddaughter, and 3 daughters Lives in: House/apartment Stairs: Yes: Internal: one flight, only uses if he needs to do a job on second floor, but able to use stairs slowly steps; on left going up; one side of the house has a ramp which he mostly uses, and one side with steps Has following equipment at home: Single point cane, Walker - 2 wheeled, and [employer] - 4 wheeled, used the walkers in the past following a knee replacement  PLOF: Independent and was able to independently do yard work easier specifically  PATIENT GOALS: Improve balance, walking, LE pain, and back pain  OBJECTIVE:  Note: Objective measures were completed at Evaluation unless otherwise noted.  DIAGNOSTIC FINDINGS: From MRI of lumbar spine 02/13/21: Disc levels:   T12-L1: Mild degenerative change.  Negative for stenosis   L1-2: Mild degenerative change.  Negative for stenosis   L2-3: Moderate disc degeneration with disc space narrowing and spurring asymmetric to the left. Mild facet degeneration. Mild spinal stenosis. Mild subarticular  stenosis bilaterally. Left lateral osteophyte formation. Left foramen mildly narrowed.   L3-4: Disc degeneration with disc space narrowing and spurring asymmetric to the right. Moderate facet hypertrophy bilaterally. Mild to moderate  spinal stenosis. Mild to moderate subarticular and foraminal stenosis on the right. Mild left subarticular stenosis.   L4-5: Disc degeneration with diffuse bulging of the disc and small central disc protrusion. Moderate facet hypertrophy. Mild to moderate spinal stenosis. Moderate subarticular and foraminal stenosis bilaterally.   L5-S1: Disc degeneration with diffuse bulging of the disc. Moderate to advanced facet hypertrophy bilaterally. Moderate subarticular stenosis on the left and mild subarticular stenosis on the right.  IMPRESSION: Multilevel degenerative change throughout the lumbar spine. Multilevel spinal and foraminal stenosis as described above  COGNITION: Overall cognitive status: Within functional limits for tasks assessed   SENSATION: Light touch: Impaired  Tingling on whole L LE  COORDINATION: H<>S: WFL, but limited range  EDEMA: mild swelling in B ankles, wears compression socks which usually cause pain  POSTURE: rounded shoulders, forward head, and flexed trunk   LOWER EXTREMITY MMT:    MMT Right Eval Left Eval  Hip flexion 4- 41  Hip abduction 4- 4-  Hip adduction 5 5  Hip internal rotation    Hip external rotation    Knee flexion 5 5  Knee extension 5 5  Ankle dorsiflexion 5 5  Ankle plantarflexion    Ankle inversion    Ankle eversion    (Blank rows = not tested)  -Lumbar ROM: Flexion: fingertips to mid shin, no pain Rotation: around 50* B, no pain Extension: WFL   RAMP:  Findings: uses ramp at home, says it easier than steps  STAIRS: Not tested GAIT: Findings: Gait Characteristics: decreased arm swing- Right, decreased arm swing- Left, decreased step length- Right, decreased step length- Left, lateral  hip instability, and wide BOS and Assistive device utilized:Single point cane  FUNCTIONAL TESTS:  Pt performed 5 time sit<>stand (5xSTS): 16.59 sec (>15 sec indicates increased fall risk)   PT instructed pt in TUG: 8.86s sec (average of 3 trials; >13.5 sec indicates increased fall risk)    PATIENT SURVEYS:  ABC Scale: 46.25%                                                                                                                             TREATMENT DATE: 12/08/2024 - NuStep: rolling [place] - Levels 3-6 x6'. Hot pack in place throughout entire 6 min.  - Seated piriformis stretch 2 x 20 sec bil  - Seated HS stretch 2 x 20 sec bil  - Standing hip flexor stretch 2 x 12 sec bil  - repeat back extension 10x5secH - reciprocal foot tap on 6 inch step without UE support x 12 bil  - side stepping in // bars with matrix cable resistance 12.5# x 3 bil  - Single limb step over half bolsters 4# AW x 12  - BLE step over half bolster x 12 bil 4# AW  - Standing hip extension x 15 bil 4# AW   -Sit<>stand with 15# kettle bell 2 x 15    PATIENT EDUCATION: Education details: purpose of PT,  possible causes of LE sensation change and weakness due to pt lumbar spine degenerative changes Person educated: Patient Education method: Explanation Education comprehension: verbalized understanding  HOME EXERCISE PROGRAM: Access Code: [email or web address] URL: [email or web address] Date: 11/27/2024 Prepared by: Massie Dollar  Exercises - Seated Piriformis Stretch  - 1 x daily - 7 x weekly - 3 sets - 10 reps - 20 sec  hold - Seated Hamstring Stretch  - 1 x daily - 7 x weekly - 3 sets - 10 reps - 20 sec hold - Standing Hip Flexor Stretch  - 1 x daily - 7 x weekly - 3 sets - 10 reps - 20 sec  hold - Side Stepping with Resistance at Thighs and Counter Support  - 1 x daily - 4 x weekly - 3 sets - 5 reps - Seated Hip Abduction with Resistance  - 1 x daily - 7 x weekly - 3 sets - 10 reps  GOALS: Goals  reviewed with patient? No   SHORT TERM GOALS: Target date: 12/14/2024  Patient will be independent in home exercise program to improve strength/mobility for better functional independence with ADLs. Baseline: Goal status: INITIAL  LONG TERM GOALS: 02/08/2025  Patient will increase ABC scale score >80% to demonstrate better functional mobility and better confidence with ADLs.  Baseline: 46.25% Goal status: INITIAL  2.  Patient (> 95 years old) will complete five times sit to stand test in < 15 seconds indicating an increased LE strength and improved balance. Baseline: 16.59s Goal status: INITIAL  3.  Patient will increase Berg Balance score by > 6 points to demonstrate decreased fall risk during functional activities Baseline: 50/56 Goal status: INITIAL  4.  Patient will increase 10 meter walk test to >1.78m/s as to improve gait speed for better community ambulation and to reduce fall risk. Baseline: 0.75 m/s with SPC Goal status: INITIAL  5.  Patient will reduce timed up and go to <11 seconds to reduce fall risk and demonstrate improved transfer/gait ability. Baseline: 8.86s Goal status: INITIAL   6. Pt will increase distance of by 150 ft in order to increase ambulation tolerance in community and public environments.  Baseline: 700' with SPC  Goal status: INITIAL  ASSESSMENT:  CLINICAL IMPRESSION:  Continued with moderate exercises, ROM, muscle lengthening. Pt has reduced pain during session. Patient will benefit from skilled physical therapy intervention to reduce deficits and impairments identified in evaluation, in order to reduce pain, improve quality of life, and maximize activity tolerance for ADL, IADL, and leisure/fitness. Physical therapy will help pt achieve long and short term goals of care.   OBJECTIVE IMPAIRMENTS: Abnormal gait, decreased balance, decreased coordination, decreased endurance, decreased mobility, difficulty walking, decreased strength, dizziness,  increased edema, impaired flexibility, impaired sensation, and pain.   ACTIVITY LIMITATIONS: carrying, lifting, bending, stairs, and transfers  PARTICIPATION LIMITATIONS: cleaning, driving, and yard work  PERSONAL FACTORS: Age, Past/current experiences, Time since onset of injury/illness/exacerbation, and 3+ comorbidities: HTN, anemia, neuropathy, depression, past joint replacement, B LE neuropathy, etc. are also affecting patient's functional outcome.   REHAB POTENTIAL: Good  CLINICAL DECISION MAKING: Evolving/moderate complexity  EVALUATION COMPLEXITY: Moderate  PLAN:  PT FREQUENCY: 2x/week  PT DURATION: 12 weeks  PLANNED INTERVENTIONS: [id number]- PT Re-evaluation, 97750- Physical Performance Testing, 97110-Therapeutic exercises, 97530- Therapeutic activity, [id number]- Neuromuscular re-education, 97535- Self Care, [id number]- Manual therapy, [id number]- Gait training, [id number]- Orthotic Initial, [id number]- Orthotic/Prosthetic subsequent, [phone]- Canalith repositioning, [id number]- Aquatic Therapy, [id number]- Electrical stimulation (unattended), [id number] (1-2 muscles), [id number] (3+ muscles)- Dry Needling, Patient/Family  education, Location manager, Stair training, Joint mobilization, Joint manipulation, Spinal mobilization, Vestibular training, DME instructions, Wheelchair mobility training, Cryotherapy, and Moist heat  PLAN FOR NEXT SESSION:   -Continue working to improve activity tolerance, gait distance, and balance with progressive exercises to patient's tolerance level.  - address gluteal and core weakness    Massie Dollar PT, DPT  Physical Therapist - [facility]  10:21 AM 12/08/2024          "

## 2024-12-11 ENCOUNTER — Other Ambulatory Visit (HOSPITAL_COMMUNITY): Payer: Self-pay

## 2024-12-12 ENCOUNTER — Ambulatory Visit: Payer: Medicare (Managed Care)

## 2024-12-14 ENCOUNTER — Ambulatory Visit: Payer: Medicare (Managed Care)

## 2024-12-14 DIAGNOSIS — Z9181 History of falling: Secondary | ICD-10-CM

## 2024-12-14 DIAGNOSIS — R260 Ataxic gait: Secondary | ICD-10-CM

## 2024-12-14 DIAGNOSIS — R293 Abnormal posture: Secondary | ICD-10-CM

## 2024-12-14 DIAGNOSIS — R2681 Unsteadiness on feet: Secondary | ICD-10-CM

## 2024-12-14 NOTE — Therapy (Signed)
 " OUTPATIENT PHYSICAL THERAPY TREATMENT  Patient Name: Eric Andrews MRN: [id number] DOB:[birth year], 74 y.o., male Today's Date: 12/14/2024   PCP: Edman Marsa PARAS, DO  REFERRING PROVIDER: Lane Arthea BRAVO, MD   END OF SESSION:  PT End of Session - 12/14/24 1019     Visit Number 8    Number of Visits 24    Date for Recertification  02/08/25    Authorization Type [employer]    Authorization Time Period 11/16/24-02/08/2025    Progress Note Due on Visit 10    PT Start Time 1016    PT Stop Time 1056    PT Time Calculation (Eric) 40 Eric    Activity Tolerance Patient tolerated treatment well;No increased pain    Behavior During Therapy WFL for tasks assessed/performed           Past Medical History:  Diagnosis Date   ADD (attention deficit disorder)    Anemia    Anxiety    Arthritis    Depression    Diabetes mellitus without complication (HCC)    Enlarged prostate    GERD (gastroesophageal reflux disease)    History of kidney stones    Hypertension    Knee pain    Neuromuscular disorder (HCC)    lower legs- neuropathy   Sleep apnea    Past Surgical History:  Procedure Laterality Date   APPENDECTOMY     CARPEL TUNEL Bilateral    COLONOSCOPY W/ POLYPECTOMY     COLONOSCOPY WITH PROPOFOL  N/A 03/22/2023   Procedure: COLONOSCOPY WITH PROPOFOL ;  Surgeon: Therisa Bi, MD;  Location: [facility] ENDOSCOPY;  Service: Gastroenterology;  Laterality: N/A;   ESOPHAGOGASTRODUODENOSCOPY (EGD) WITH PROPOFOL  N/A 08/18/2023   Procedure: ESOPHAGOGASTRODUODENOSCOPY (EGD) WITH PROPOFOL ;  Surgeon: Therisa Bi, MD;  Location: [facility] ENDOSCOPY;  Service: Gastroenterology;  Laterality: N/A;   HERNIA REPAIR     Left   JOINT REPLACEMENT     KNEE ARTHROSCOPY     Left   RIGHT WRIST SX X2     TOTAL KNEE ARTHROPLASTY Left 04/21/2022   Procedure: TOTAL KNEE ARTHROPLASTY;  Surgeon: Kathlynn Sharper, MD;  Location: [facility] ORS;  Service: Orthopedics;  Laterality: Left;   TOTAL SHOULDER ARTHROPLASTY Left  01/05/2023   Procedure: Left total shoulder arthroplasty, biceps tenodesis;  Surgeon: Tobie Priest, MD;  Location: [facility] ORS;  Service: Orthopedics;  Laterality: Left;   vascetomy     Patient Active Problem List   Diagnosis Date Noted   Atherosclerosis of aorta 01/03/2024   OSA on CPAP 10/29/2023   Abdominal pain, epigastric 08/18/2023   Colon cancer screening 03/22/2023   Adenomatous polyp of colon 03/22/2023   Osteoarthritis of glenohumeral joint, left 01/20/2023   Status post total replacement of left shoulder 01/20/2023   S/P TKR (total knee replacement) using cement, left 04/21/2022   Bilateral primary osteoarthritis of knee 12/17/2021   Gastroesophageal reflux disease without esophagitis 07/08/2021   Environmental and seasonal allergies 02/27/2021   Hyperlipidemia associated with type 2 diabetes mellitus (HCC) 02/27/2021   Morbid obesity with BMI of 45.0-49.9, adult (HCC) 06/26/2020   Major depressive disorder, recurrent, moderate (HCC) 06/25/2020   GAD (generalized anxiety disorder) 06/25/2020   Type 2 diabetes mellitus with other specified complication (HCC) 05/27/2020   Chronic pain of both knees 05/27/2020   Primary osteoarthritis involving multiple joints 05/27/2020   Cataract associated with type 2 diabetes mellitus (HCC) 05/27/2020   History of torn meniscus of left knee 05/27/2020   DDD (degenerative disc disease), lumbar 05/27/2020  ONSET DATE: about 3 years ago, 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 A little stiff from working on projects at home.  PERTINENT HISTORY: From AMB referral: Patient presenting with lower extremity pain, 6/10 and difficulty walking long distances. Ambulating with cane/ No neck pain or difficulty with cervical rotation. Hand pain is intermittent, wearing wrist braces. Increased unstable gait, imbalance. No recent falls. Difficulty ambulating long distances. Referral to PT with gait and balance deficits.  PAIN:  Are you having pain? 3-4/10   PRECAUTIONS: None  WEIGHT BEARING RESTRICTIONS: No  FALLS: Has patient fallen in last 6 months? 3 or 4 near falls in the 6 months, due to his legs giving out  LIVING ENVIRONMENT: Lives with: lives with their family and great grandson, granddaughter, and 3 daughters Lives in: House/apartment Stairs: Yes: Internal: one flight, only uses if he needs to do a job on second floor, but able to use stairs slowly steps; on left going up; one side of the house has a ramp which he mostly uses, and one side with steps Has following equipment at home: Single point cane, Walker - 2 wheeled, and [employer] - 4 wheeled, used the walkers in the past following a knee replacement  PLOF: Independent and was able to independently do yard work easier specifically  PATIENT GOALS: Improve balance, walking, LE pain, and back pain  OBJECTIVE:  Note: Objective measures were completed at Evaluation unless otherwise noted.  DIAGNOSTIC FINDINGS: From MRI of lumbar spine 02/13/21: Disc levels:   T12-L1: Mild degenerative change.  Negative for stenosis   L1-2: Mild degenerative change.  Negative for stenosis   L2-3: Moderate disc degeneration with disc space narrowing and spurring asymmetric to the left. Mild facet degeneration. Mild spinal stenosis. Mild subarticular stenosis bilaterally. Left lateral osteophyte formation. Left foramen mildly narrowed.   L3-4: Disc degeneration with disc space narrowing and  spurring asymmetric to the right. Moderate facet hypertrophy bilaterally. Mild to moderate spinal stenosis. Mild to moderate subarticular and foraminal stenosis on the right. Mild left subarticular stenosis.   L4-5: Disc degeneration with diffuse bulging of the  disc and small central disc protrusion. Moderate facet hypertrophy. Mild to moderate spinal stenosis. Moderate subarticular and foraminal stenosis bilaterally.   L5-S1: Disc degeneration with diffuse bulging of the disc. Moderate to advanced facet hypertrophy bilaterally. Moderate subarticular stenosis on the left and mild subarticular stenosis on the right.  IMPRESSION: Multilevel degenerative change throughout the lumbar spine. Multilevel spinal and foraminal stenosis as described above  COGNITION: Overall cognitive status: Within functional limits for tasks assessed   SENSATION: Light touch: Impaired  Tingling on whole L LE  COORDINATION: H<>S: WFL, but limited range  EDEMA: mild swelling in B ankles, wears compression socks which usually cause pain  POSTURE: rounded shoulders, forward head, and flexed trunk   LOWER EXTREMITY MMT:    MMT Right Eval Left Eval  Hip flexion 4- 4  Hip abduction 4- 4-  Hip adduction 5 5  Hip internal rotation    Hip external rotation    Knee flexion 5 5  Knee extension 5 5  Ankle dorsiflexion 5 5  Ankle plantarflexion    Ankle inversion    Ankle eversion    (Blank rows = not tested)  -Lumbar ROM: Flexion: fingertips to mid shin, no pain Rotation: around 50* B, no pain Extension: WFL   RAMP:  Findings: uses ramp at home, says it easier than steps  STAIRS: Not tested GAIT: Findings: Gait Characteristics: decreased arm swing- Right, decreased arm swing- Left, decreased step length- Right, decreased step length- Left, lateral hip instability, and wide BOS and Assistive device utilized:Single point cane  FUNCTIONAL TESTS:  Pt performed 5 time sit<>stand (5xSTS): 16.59  sec (>15 sec indicates increased fall risk)   PT instructed pt in TUG: 8.86s sec (average of 3 trials; >13.5 sec indicates increased fall risk)    PATIENT SURVEYS:  ABC Scale: 46.25%                                                                                                                             TREATMENT DATE: 12/14/2024 - NuStep: rolling [place] - Levels 3-6 x6'. Hot pack in place throughout entire 6 Eric.  - repeat back extension over bar x15 - reciprocal foot tap on 6 inch step without UE support x 12 bil  - side stepping in // bars with matrix cable resistance 12.5# x 3x bil  *sit break  -Sit<>stand with 15# kettle bell 1 x 15 *sit break  -cable resisted backward walking 4x71ft in // bars 17.5lb, then 4x71ft  17.5lb, *sit break  -Sit<>stand with 15# kettle bell 1 x 15 -AMB overground 430ft 4lb AW bilat, no device; 91m8sec *legs feeling fatigued by final 31ft   PATIENT EDUCATION: Education details: purpose of PT, possible causes of LE sensation change and weakness due to pt lumbar spine degenerative changes Person educated: Patient Education method: Explanation Education comprehension: verbalized understanding  HOME EXERCISE PROGRAM: Access Code: [email or web address] URL: [email or web address] Date: 11/27/2024 Prepared by: Massie Dollar  Exercises - Seated Piriformis Stretch  -  1 x daily - 7 x weekly - 3 sets - 10 reps - 20 sec  hold - Seated Hamstring Stretch  - 1 x daily - 7 x weekly - 3 sets - 10 reps - 20 sec hold - Standing Hip Flexor Stretch  - 1 x daily - 7 x weekly - 3 sets - 10 reps - 20 sec  hold - Side Stepping with Resistance at Thighs and Counter Support  - 1 x daily - 4 x weekly - 3 sets - 5 reps - Seated Hip Abduction with Resistance  - 1 x daily - 7 x weekly - 3 sets - 10 reps  GOALS: Goals reviewed with patient? No   SHORT TERM GOALS: Target date: 12/14/2024  Patient will be independent in home exercise program to improve strength/mobility for  better functional independence with ADLs. Baseline: Goal status: INITIAL  LONG TERM GOALS: 02/08/2025  Patient will increase ABC scale score >80% to demonstrate better functional mobility and better confidence with ADLs.  Baseline: 46.25% Goal status: INITIAL  2.  Patient (> 58 years old) will complete five times sit to stand test in < 15 seconds indicating an increased LE strength and improved balance. Baseline: 16.59s Goal status: INITIAL  3.  Patient will increase Berg Balance score by > 6 points to demonstrate decreased fall risk during functional activities Baseline: 50/56 Goal status: INITIAL  4.  Patient will increase 10 meter walk test to >1.8m/s as to improve gait speed for better community ambulation and to reduce fall risk. Baseline: 0.75 m/s with SPC Goal status: INITIAL  5.  Patient will reduce timed up and go to <11 seconds to reduce fall risk and demonstrate improved transfer/gait ability. Baseline: 8.86s Goal status: INITIAL   6. Pt will increase distance of by 150 ft in order to increase ambulation tolerance in community and public environments.  Baseline: 700' with SPC  Goal status: INITIAL  ASSESSMENT:  CLINICAL IMPRESSION:  Continued with moderate exercises, ROM, muscle lengthening. Aded in more cable resisted walking and longer overground walking without device. Pt has reduced pain during session. Patient will benefit from skilled physical therapy intervention to reduce deficits and impairments identified in evaluation, in order to reduce pain, improve quality of life, and maximize activity tolerance for ADL, IADL, and leisure/fitness. Physical therapy will help pt achieve long and short term goals of care.   OBJECTIVE IMPAIRMENTS: Abnormal gait, decreased balance, decreased coordination, decreased endurance, decreased mobility, difficulty walking, decreased strength, dizziness, increased edema, impaired flexibility, impaired sensation, and pain.    ACTIVITY LIMITATIONS: carrying, lifting, bending, stairs, and transfers  PARTICIPATION LIMITATIONS: cleaning, driving, and yard work  PERSONAL FACTORS: Age, Past/current experiences, Time since onset of injury/illness/exacerbation, and 3+ comorbidities: HTN, anemia, neuropathy, depression, past joint replacement, B LE neuropathy, etc. are also affecting patient's functional outcome.   REHAB POTENTIAL: Good  CLINICAL DECISION MAKING: Evolving/moderate complexity  EVALUATION COMPLEXITY: Moderate  PLAN:  PT FREQUENCY: 2x/week  PT DURATION: 12 weeks  PLANNED INTERVENTIONS: [id number]- PT Re-evaluation, 97750- Physical Performance Testing, 97110-Therapeutic exercises, 97530- Therapeutic activity, [id number]- Neuromuscular re-education, 97535- Self Care, [id number]- Manual therapy, [phone]- Gait training, [phone]- Orthotic Initial, [phone]- Orthotic/Prosthetic subsequent, [phone]- Canalith repositioning, [id number]- Aquatic Therapy, [phone]- Electrical stimulation (unattended), [phone] (1-2 muscles), [id number] (3+ muscles)- Dry Needling, Patient/Family education, Balance training, Stair training, Joint mobilization, Joint manipulation, Spinal mobilization, Vestibular training, DME instructions, Wheelchair mobility training, Cryotherapy, and Moist heat  PLAN FOR NEXT SESSION:  -Continue working to improve activity tolerance, gait distance,  and balance with progressive exercises to patient's tolerance level.  - address gluteal and core weakness   10:22 AM, 12/14/2024 Peggye JAYSON Linear, PT, DPT Physical Therapist - [facility]  Outpatient Physical Therapy- Main Campus [phone]           "

## 2024-12-18 ENCOUNTER — Ambulatory Visit: Payer: Medicare (Managed Care)

## 2024-12-18 DIAGNOSIS — R293 Abnormal posture: Secondary | ICD-10-CM

## 2024-12-18 DIAGNOSIS — R260 Ataxic gait: Secondary | ICD-10-CM

## 2024-12-18 DIAGNOSIS — R2681 Unsteadiness on feet: Secondary | ICD-10-CM

## 2024-12-18 DIAGNOSIS — Z9181 History of falling: Secondary | ICD-10-CM

## 2024-12-18 NOTE — Therapy (Signed)
 " OUTPATIENT PHYSICAL THERAPY TREATMENT  Patient Name: Eric Andrews MRN: [id number] DOB:1951-02-17, 74 y.o., male Today's Date: 12/18/2024   PCP: Edman Marsa PARAS, DO  REFERRING PROVIDER: Lane Arthea BRAVO, MD   END OF SESSION:  PT End of Session - 12/18/24 1152     Visit Number 9    Number of Visits 24    Date for Recertification  02/08/25    Authorization Type [employer]    Authorization Time Period 11/16/24-02/08/2025    Progress Note Due on Visit 10    PT Start Time 1146    PT Stop Time 1226    PT Time Calculation (min) 40 min    Activity Tolerance Patient tolerated treatment well;No increased pain    Behavior During Therapy WFL for tasks assessed/performed           Past Medical History:  Diagnosis Date   ADD (attention deficit disorder)    Anemia    Anxiety    Arthritis    Depression    Diabetes mellitus without complication (HCC)    Enlarged prostate    GERD (gastroesophageal reflux disease)    History of kidney stones    Hypertension    Knee pain    Neuromuscular disorder (HCC)    lower legs- neuropathy   Sleep apnea    Past Surgical History:  Procedure Laterality Date   APPENDECTOMY     CARPEL TUNEL Bilateral    COLONOSCOPY W/ POLYPECTOMY     COLONOSCOPY WITH PROPOFOL  N/A 03/22/2023   Procedure: COLONOSCOPY WITH PROPOFOL ;  Surgeon: Therisa Bi, MD;  Location: [facility] ENDOSCOPY;  Service: Gastroenterology;  Laterality: N/A;   ESOPHAGOGASTRODUODENOSCOPY (EGD) WITH PROPOFOL  N/A 08/18/2023   Procedure: ESOPHAGOGASTRODUODENOSCOPY (EGD) WITH PROPOFOL ;  Surgeon: Therisa Bi, MD;  Location: [facility] ENDOSCOPY;  Service: Gastroenterology;  Laterality: N/A;   HERNIA REPAIR     Left   JOINT REPLACEMENT     KNEE ARTHROSCOPY     Left   RIGHT WRIST SX X2     TOTAL KNEE ARTHROPLASTY Left 04/21/2022   Procedure: TOTAL KNEE ARTHROPLASTY;  Surgeon: Kathlynn Sharper, MD;  Location: [facility] ORS;  Service: Orthopedics;  Laterality: Left;   TOTAL SHOULDER ARTHROPLASTY Left  01/05/2023   Procedure: Left total shoulder arthroplasty, biceps tenodesis;  Surgeon: Tobie Priest, MD;  Location: [facility] ORS;  Service: Orthopedics;  Laterality: Left;   vascetomy     Patient Active Problem List   Diagnosis Date Noted   Atherosclerosis of aorta 01/03/2024   OSA on CPAP 10/29/2023   Abdominal pain, epigastric 08/18/2023   Colon cancer screening 03/22/2023   Adenomatous polyp of colon 03/22/2023   Osteoarthritis of glenohumeral joint, left 01/20/2023   Status post total replacement of left shoulder 01/20/2023   S/P TKR (total knee replacement) using cement, left 04/21/2022   Bilateral primary osteoarthritis of knee 12/17/2021   Gastroesophageal reflux disease without esophagitis 07/08/2021   Environmental and seasonal allergies 02/27/2021   Hyperlipidemia associated with type 2 diabetes mellitus (HCC) 02/27/2021   Morbid obesity with BMI of 45.0-49.9, adult (HCC) 06/26/2020   Major depressive disorder, recurrent, moderate (HCC) 06/25/2020   GAD (generalized anxiety disorder) 06/25/2020   Type 2 diabetes mellitus with other specified complication (HCC) 05/27/2020   Chronic pain of both knees 05/27/2020   Primary osteoarthritis involving multiple joints 05/27/2020   Cataract associated with type 2 diabetes mellitus (HCC) 05/27/2020   History of torn meniscus of left knee 05/27/2020   DDD (degenerative disc disease), lumbar 05/27/2020  ONSET DATE: about 3 years ago, 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 Good weekend. Pain was bad yesterday, but good today.   PERTINENT HISTORY:  From AMB referral: Patient presenting with lower extremity pain, 6/10 and difficulty walking long distances. Ambulating with cane/ No neck pain or difficulty with cervical rotation. Hand pain is intermittent, wearing wrist braces. Increased unstable gait, imbalance. No recent falls. Difficulty ambulating long distances. Referral to PT with gait and balance deficits.  PAIN:  Are you having pain? 2/10   PRECAUTIONS: None  WEIGHT BEARING RESTRICTIONS: No  FALLS: Has patient fallen in last 6 months? 3 or 4 near falls in the 6 months, due to his legs giving out  LIVING ENVIRONMENT: Lives with: lives with their family and great grandson, granddaughter, and 3 daughters Lives in: House/apartment Stairs: Yes: Internal: one flight, only uses if he needs to do a job on second floor, but able to use stairs slowly steps; on left going up; one side of the house has a ramp which he mostly uses, and one side with steps Has following equipment at home: Single point cane, Walker - 2 wheeled, and [employer] - 4 wheeled, used the walkers in the past following a knee replacement  PLOF: Independent and was able to independently do yard work easier specifically  PATIENT GOALS: Improve balance, walking, LE pain, and back pain  OBJECTIVE:  Note: Objective measures were completed at Evaluation unless otherwise noted.  DIAGNOSTIC FINDINGS: From MRI of lumbar spine 02/13/21: Disc levels:   T12-L1: Mild degenerative change.  Negative for stenosis   L1-2: Mild degenerative change.  Negative for stenosis   L2-3: Moderate disc degeneration with disc space narrowing and spurring asymmetric to the left. Mild facet degeneration. Mild spinal stenosis. Mild subarticular stenosis bilaterally. Left lateral osteophyte formation. Left foramen mildly narrowed.   L3-4: Disc degeneration with disc space narrowing and  spurring asymmetric to the right. Moderate facet hypertrophy bilaterally. Mild to moderate spinal stenosis. Mild to moderate subarticular and foraminal stenosis on the right. Mild left subarticular stenosis.   L4-5: Disc degeneration with diffuse bulging  of the disc and small central disc protrusion. Moderate facet hypertrophy. Mild to moderate spinal stenosis. Moderate subarticular and foraminal stenosis bilaterally.   L5-S1: Disc degeneration with diffuse bulging of the disc. Moderate to advanced facet hypertrophy bilaterally. Moderate subarticular stenosis on the left and mild subarticular stenosis on the right.  IMPRESSION: Multilevel degenerative change throughout the lumbar spine. Multilevel spinal and foraminal stenosis as described above  COGNITION: Overall cognitive status: Within functional limits for tasks assessed   SENSATION: Light touch: Impaired  Tingling on whole L LE  LOWER EXTREMITY MMT:    MMT Right Eval Left Eval  Hip flexion 4- 4  Hip abduction 4- 4-  Hip adduction 5 5  Hip internal rotation    Hip external rotation    Knee flexion 5 5  Knee extension 5 5  Ankle dorsiflexion 5 5  Ankle plantarflexion    Ankle inversion    Ankle eversion    (Blank rows = not tested)  -Lumbar ROM: Flexion: fingertips to mid shin, no pain Rotation: around 50* B, no pain Extension: WFL   RAMP:  Findings: uses ramp at home, says it easier than steps  STAIRS: Not tested GAIT: Findings: Gait Characteristics: decreased arm swing- Right, decreased arm swing- Left, decreased step length- Right, decreased step length- Left, lateral hip instability, and wide BOS and Assistive device utilized:Single point cane  FUNCTIONAL TESTS:  Pt performed 5 time sit<>stand (5xSTS): 16.59 sec (>15 sec indicates increased fall risk)   PT instructed pt in TUG: 8.86s sec (average of 3 trials; >13.5 sec indicates increased fall risk)    PATIENT SURVEYS:  ABC Scale: 46.25%;  12/18/24: 50.6%                                                                                                                             TREATMENT DATE: 12/18/2024 - NuStep: Levels 4 x6'. Hot pack in place throughout entire 6 min.  -5x STS: 12.43sec -ABC Scale:  - side stepping in // bars with matrix cable resistance 12.5# x 3x bil  *sit break  -cable resisted backward walking 4x2ft in // bars 17.5lb, then 4x67ft  17.5lb (6 step halfway)  *sit break  - reciprocal foot tap on 6 inch step without UE support x 15 bil  -alternate 360 degree turns, hands free x10 (5x each way) legs feeling weaker. *sit break  -narrow stance on foam rectangle eyes closed 12x2secH  *sit break  - side stepping in // bars with matrix cable resistance 14.5# x 3x bil     PATIENT EDUCATION: Education details: purpose of PT, possible causes of LE sensation change and weakness due to pt lumbar spine degenerative changes Person educated: Patient Education method: Explanation Education comprehension: verbalized understanding  HOME EXERCISE PROGRAM: Access Code: [email or web address] URL: [email or web address] Date: 11/27/2024 Prepared by: Massie Dollar  Exercises - Seated Piriformis Stretch  - 1 x daily - 7 x weekly - 3 sets - 10 reps - 20  sec  hold - Seated Hamstring Stretch  - 1 x daily - 7 x weekly - 3 sets - 10 reps - 20 sec hold - Standing Hip Flexor Stretch  - 1 x daily - 7 x weekly - 3 sets - 10 reps - 20 sec  hold - Side Stepping with Resistance at Thighs and Counter Support  - 1 x daily - 4 x weekly - 3 sets - 5 reps - Seated Hip Abduction with Resistance  - 1 x daily - 7 x weekly - 3 sets - 10 reps  GOALS: Goals reviewed with patient? No   SHORT TERM GOALS: Target date: 12/14/2024  Patient will be independent in home exercise program to improve strength/mobility for better functional independence with ADLs. Baseline: 12/18/24: he's got it Goal status: MET  LONG TERM GOALS:  02/08/2025  Patient will increase ABC scale score >80% to demonstrate better functional mobility and better confidence with ADLs.  Baseline: 46.25%; 12/18/24: 50.6% Goal status: PROGRESSING  2.  Patient (> 74 years old) will complete five times sit to stand test in < 15 seconds indicating an increased LE strength and improved balance. Baseline: 16.59s; 12/18/24: 12.43sec Goal status: PROGRESSING  3.  Patient will increase Berg Balance score by > 6 points to demonstrate decreased fall risk during functional activities Baseline: 50/56 Goal status: INITIAL  4.  Patient will increase 10 meter walk test to >1.43m/s as to improve gait speed for better community ambulation and to reduce fall risk. Baseline: 0.75 m/s with SPC Goal status: INITIAL  5.  Patient will reduce timed up and go to <11 seconds to reduce fall risk and demonstrate improved transfer/gait ability. Baseline: 8.86s Goal status: INITIAL   6. Pt will increase distance of by 150 ft in order to increase ambulation tolerance in community and public environments.  Baseline: 700' with SPC  Goal status: INITIAL  ASSESSMENT:  CLINICAL IMPRESSION:  Continued with moderate exercises, ROM, muscle lengthening. Aded in more cable resisted walking and longer overground walking without device. Able to advance cable resistance today. Retested 5xSTS and ABC Scale, both of which show improvement since evaluation. Pt has reduced pain during session. Patient will benefit from skilled physical therapy intervention to reduce deficits and impairments identified in evaluation, in order to reduce pain, improve quality of life, and maximize activity tolerance for ADL, IADL, and leisure/fitness. Physical therapy will help pt achieve long and short term goals of care.   OBJECTIVE IMPAIRMENTS: Abnormal gait, decreased balance, decreased coordination, decreased endurance, decreased mobility, difficulty walking, decreased strength, dizziness, increased  edema, impaired flexibility, impaired sensation, and pain.   ACTIVITY LIMITATIONS: carrying, lifting, bending, stairs, and transfers  PARTICIPATION LIMITATIONS: cleaning, driving, and yard work  PERSONAL FACTORS: Age, Past/current experiences, Time since onset of injury/illness/exacerbation, and 3+ comorbidities: HTN, anemia, neuropathy, depression, past joint replacement, B LE neuropathy, etc. are also affecting patient's functional outcome.   REHAB POTENTIAL: Good  CLINICAL DECISION MAKING: Evolving/moderate complexity  EVALUATION COMPLEXITY: Moderate  PLAN:  PT FREQUENCY: 2x/week  PT DURATION: 12 weeks  PLANNED INTERVENTIONS: [id number]- PT Re-evaluation, 97750- Physical Performance Testing, 97110-Therapeutic exercises, 97530- Therapeutic activity, 97112- Neuromuscular re-education, 97535- Self Care, [id number]- Manual therapy, [phone]- Gait training, [phone]- Orthotic Initial, [phone]- Orthotic/Prosthetic subsequent, [phone]- Canalith repositioning, [id number]- Aquatic Therapy, [phone]- Electrical stimulation (unattended), [phone] (1-2 muscles), [phone] (3+ muscles)- Dry Needling, Patient/Family education, Balance training, Stair training, Joint mobilization, Joint manipulation, Spinal mobilization, Vestibular training, DME instructions, Wheelchair mobility training, Cryotherapy, and Moist heat  PLAN FOR  NEXT SESSION:  -Continue working to improve activity tolerance, gait distance, and balance with progressive exercises to patient's tolerance level.  - address gluteal and core weakness   11:53 AM, 12/18/2024 Peggye JAYSON Linear, PT, DPT Physical Therapist - [facility]  Outpatient Physical Therapy- Main Campus [phone]           "

## 2024-12-21 ENCOUNTER — Ambulatory Visit: Payer: Medicare (Managed Care)

## 2024-12-21 DIAGNOSIS — R260 Ataxic gait: Secondary | ICD-10-CM

## 2024-12-21 DIAGNOSIS — R2681 Unsteadiness on feet: Secondary | ICD-10-CM

## 2024-12-21 DIAGNOSIS — R293 Abnormal posture: Secondary | ICD-10-CM | POA: Diagnosis not present

## 2024-12-21 DIAGNOSIS — Z9181 History of falling: Secondary | ICD-10-CM

## 2024-12-21 NOTE — Therapy (Signed)
 " OUTPATIENT PHYSICAL THERAPY TREATMENT/Physical Therapy Progress Note   Dates of reporting period  11/16/2024   to   12/21/2024   Patient Name: Eric Andrews MRN: [id number] DOB:02-28-1951, 74 y.o., male Today's Date: 12/22/2024   PCP: Edman Marsa PARAS, DO  REFERRING PROVIDER: Lane Arthea BRAVO, MD   END OF SESSION:  PT End of Session - 12/21/24 0848     Visit Number 10    Number of Visits 24    Date for Recertification  02/08/25    Authorization Type [employer]    Authorization Time Period 11/16/24-02/08/2025    Progress Note Due on Visit 20    PT Start Time 0848    PT Stop Time 0929    PT Time Calculation (min) 41 min    Activity Tolerance Patient tolerated treatment well;No increased pain    Behavior During Therapy WFL for tasks assessed/performed           Past Medical History:  Diagnosis Date   ADD (attention deficit disorder)    Anemia    Anxiety    Arthritis    Depression    Diabetes mellitus without complication (HCC)    Enlarged prostate    GERD (gastroesophageal reflux disease)    History of kidney stones    Hypertension    Knee pain    Neuromuscular disorder (HCC)    lower legs- neuropathy   Sleep apnea    Past Surgical History:  Procedure Laterality Date   APPENDECTOMY     CARPEL TUNEL Bilateral    COLONOSCOPY W/ POLYPECTOMY     COLONOSCOPY WITH PROPOFOL  N/A 03/22/2023   Procedure: COLONOSCOPY WITH PROPOFOL ;  Surgeon: Therisa Bi, MD;  Location: [facility] ENDOSCOPY;  Service: Gastroenterology;  Laterality: N/A;   ESOPHAGOGASTRODUODENOSCOPY (EGD) WITH PROPOFOL  N/A 08/18/2023   Procedure: ESOPHAGOGASTRODUODENOSCOPY (EGD) WITH PROPOFOL ;  Surgeon: Therisa Bi, MD;  Location: [facility] ENDOSCOPY;  Service: Gastroenterology;  Laterality: N/A;   HERNIA REPAIR     Left   JOINT REPLACEMENT     KNEE ARTHROSCOPY     Left   RIGHT WRIST SX X2     TOTAL KNEE ARTHROPLASTY Left 04/21/2022   Procedure: TOTAL KNEE ARTHROPLASTY;  Surgeon: Kathlynn Sharper, MD;   Location: [facility] ORS;  Service: Orthopedics;  Laterality: Left;   TOTAL SHOULDER ARTHROPLASTY Left 01/05/2023   Procedure: Left total shoulder arthroplasty, biceps tenodesis;  Surgeon: Tobie Priest, MD;  Location: [facility] ORS;  Service: Orthopedics;  Laterality: Left;   vascetomy     Patient Active Problem List   Diagnosis Date Noted   Atherosclerosis of aorta 01/03/2024   OSA on CPAP 10/29/2023   Abdominal pain, epigastric 08/18/2023   Colon cancer screening 03/22/2023   Adenomatous polyp of colon 03/22/2023   Osteoarthritis of glenohumeral joint, left 01/20/2023   Status post total replacement of left shoulder 01/20/2023   S/P TKR (total knee replacement) using cement, left 04/21/2022   Bilateral primary osteoarthritis of knee 12/17/2021   Gastroesophageal reflux disease without esophagitis 07/08/2021   Environmental and seasonal allergies 02/27/2021   Hyperlipidemia associated with type 2 diabetes mellitus (HCC) 02/27/2021   Morbid obesity with BMI of 45.0-49.9, adult (HCC) 06/26/2020   Major depressive disorder, recurrent, moderate (HCC) 06/25/2020   GAD (generalized anxiety disorder) 06/25/2020   Type 2 diabetes mellitus with other specified complication (HCC) 05/27/2020   Chronic pain of both knees 05/27/2020   Primary osteoarthritis involving multiple joints 05/27/2020   Cataract associated with type 2 diabetes mellitus (HCC) 05/27/2020   History  of torn meniscus of left knee 05/27/2020   DDD (degenerative disc disease), lumbar 05/27/2020    ONSET DATE: about 3 years ago, one year after he retired from working as a heavy research scientist (physical sciences)  REFERRING DIAG:   R26.89 (ICD-10-CM) - Imbalance    THERAPY DIAG:  Abnormal posture  Ataxic gait  History of falling  Unsteadiness on feet  Rationale for Evaluation and Treatment: Rehabilitation  SUBJECTIVE:                                                                                                                                                                                              SUBJECTIVE STATEMENT: Pt reports doing okay and having good and bad days. Reports balance is still off but feels the PT is helping.  PERTINENT HISTORY:  From AMB referral: Patient presenting with lower extremity pain, 6/10 and difficulty walking long distances. Ambulating with cane/ No neck pain or difficulty with cervical rotation. Hand pain is intermittent, wearing wrist braces. Increased unstable gait, imbalance. No recent falls. Difficulty ambulating long distances. Referral to PT with gait and balance deficits.  PAIN:  Are you having pain? 2/10   PRECAUTIONS: None  WEIGHT BEARING RESTRICTIONS: No  FALLS: Has patient fallen in last 6 months? 3 or 4 near falls in the 6 months, due to his legs giving out  LIVING ENVIRONMENT: Lives with: lives with their family and great grandson, granddaughter, and 3 daughters Lives in: House/apartment Stairs: Yes: Internal: one flight, only uses if he needs to do a job on second floor, but able to use stairs slowly steps; on left going up; one side of the house has a ramp which he mostly uses, and one side with steps Has following equipment at home: Single point cane, Walker - 2 wheeled, and [employer] - 4 wheeled, used the walkers in the past following a knee replacement  PLOF: Independent and was able to independently do yard work easier specifically  PATIENT GOALS: Improve balance, walking, LE pain, and back pain  OBJECTIVE:  Note: Objective measures were completed at Evaluation unless otherwise noted.  DIAGNOSTIC FINDINGS: From MRI of lumbar spine 02/13/21: Disc levels:   T12-L1: Mild degenerative change.  Negative for stenosis   L1-2: Mild degenerative change.  Negative for stenosis   L2-3: Moderate disc degeneration with disc space narrowing and spurring asymmetric to the left. Mild facet degeneration. Mild spinal stenosis. Mild subarticular stenosis bilaterally.  Left lateral osteophyte formation. Left foramen mildly narrowed.   L3-4: Disc degeneration with disc space narrowing and spurring asymmetric to the right. Moderate facet hypertrophy bilaterally. Mild to moderate spinal stenosis.  Mild to moderate subarticular and foraminal stenosis on the right. Mild left subarticular stenosis.   L4-5: Disc degeneration with diffuse bulging of the disc and small central disc protrusion. Moderate facet hypertrophy. Mild to moderate spinal stenosis. Moderate subarticular and foraminal stenosis bilaterally.   L5-S1: Disc degeneration with diffuse bulging of the disc. Moderate to advanced facet hypertrophy bilaterally. Moderate subarticular stenosis on the left and mild subarticular stenosis on the right.  IMPRESSION: Multilevel degenerative change throughout the lumbar spine. Multilevel spinal and foraminal stenosis as described above  COGNITION: Overall cognitive status: Within functional limits for tasks assessed   SENSATION: Light touch: Impaired  Tingling on whole L LE  LOWER EXTREMITY MMT:    MMT Right Eval Left Eval  Hip flexion 4- 4  Hip abduction 4- 4-  Hip adduction 5 5  Hip internal rotation    Hip external rotation    Knee flexion 5 5  Knee extension 5 5  Ankle dorsiflexion 5 5  Ankle plantarflexion    Ankle inversion    Ankle eversion    (Blank rows = not tested)  -Lumbar ROM: Flexion: fingertips to mid shin, no pain Rotation: around 50* B, no pain Extension: WFL   RAMP:  Findings: uses ramp at home, says it easier than steps  STAIRS: Not tested GAIT: Findings: Gait Characteristics: decreased arm swing- Right, decreased arm swing- Left, decreased step length- Right, decreased step length- Left, lateral hip instability, and wide BOS and Assistive device utilized:Single point cane  FUNCTIONAL TESTS:  Pt performed 5 time sit<>stand (5xSTS): 16.59 sec (>15 sec indicates increased fall risk)   PT instructed pt in TUG:  8.86s sec (average of 3 trials; >13.5 sec indicates increased fall risk)    PATIENT SURVEYS:  ABC Scale: 46.25%; 12/18/24: 50.6%                                                                                                                             TREATMENT DATE: 12/22/2024  Physical therapy treatment session today consisted of completing assessment of goals and administration of testing as demonstrated and documented in flow sheet, treatment, and goals section of this note. Addition treatments may be found below.    [facility] PT Assessment - 12/21/24 0906       Standardized Balance Assessment   Standardized Balance Assessment Berg Balance Test      Berg Balance Test   Sit to Stand Able to stand without using hands and stabilize independently    Standing Unsupported Able to stand safely 2 minutes    Sitting with Back Unsupported but Feet Supported on Floor or Stool Able to sit safely and securely 2 minutes    Stand to Sit Sits safely with minimal use of hands    Transfers Able to transfer safely, minor use of hands    Standing Unsupported with Eyes Closed Able to stand 10 seconds safely    Standing Unsupported with Feet Together Able to place  feet together independently and stand 1 minute safely    From Standing, Reach Forward with Outstretched Arm Can reach confidently >25 cm (10)    From Standing Position, Pick up Object from Floor Able to pick up shoe safely and easily    From Standing Position, Turn to Look Behind Over each Shoulder Looks behind from both sides and weight shifts well    Turn 360 Degrees Able to turn 360 degrees safely in 4 seconds or less    Standing Unsupported, Alternately Place Feet on Step/Stool Able to stand independently and safely and complete 8 steps in 20 seconds    Standing Unsupported, One Foot in Front Able to plae foot ahead of the other independently and hold 30 seconds    Standing on One Leg Able to lift leg independently and hold equal to or more than  3 seconds    Total Score 53          10 Meter Walk Test: Patient instructed to walk 10 meters (32.8 ft) as quickly and as safely as possible at their normal speed x2 and at a fast speed x2. Time measured from 2 meter mark to 8 meter mark to accommodate ramp-up and ramp-down.  Normal speed 1: 1.1 m/s Normal speed 2: 1.1 m/s Average Normal speed: 1.1 m/s  Cut off scores: <0.4 m/s = household Ambulator, 0.4-0.8 m/s = limited community Ambulator, >0.8 m/s = community Ambulator, >1.2 m/s = crossing a street, <1.0 = increased fall risk MCID 0.05 m/s (small), 0.13 m/s (moderate), 0.06 m/s (significant)  (ANPTA Core Set of Outcome Measures for Adults with Neurologic Conditions, 2018)   Self care-  Added tandem standing - Hold 30 sec x 3 Added SLS (instructed to hold as long as possible up to 20 sec ea LE) only able to hold a few sec on L but up to 10 sec on RLE.  Brief verbal review of previous provided HEP- Discussion of compliance for best outcome  PATIENT EDUCATION: Education details: Edu in HEP Person educated: Patient Education method: Explanation Education comprehension: verbalized understanding  HOME EXERCISE PROGRAM: Access Code: [email or web address] URL: [email or web address] Date: 12/22/2024 Prepared by: Reyes London  Exercises - Single Leg Stance  - 2 x weekly - 3 sets - as long as possible hold - Tandem Stance  - 2 x weekly - 3 sets - 30 sec hold   Access Code: [email or web address] URL: [email or web address] Date: 11/27/2024 Prepared by: Massie Dollar  Exercises - Seated Piriformis Stretch  - 1 x daily - 7 x weekly - 3 sets - 10 reps - 20 sec  hold - Seated Hamstring Stretch  - 1 x daily - 7 x weekly - 3 sets - 10 reps - 20 sec hold - Standing Hip Flexor Stretch  - 1 x daily - 7 x weekly - 3 sets - 10 reps - 20 sec  hold - Side Stepping with Resistance at Thighs and Counter Support  - 1 x daily - 4 x weekly - 3 sets - 5 reps - Seated Hip Abduction with  Resistance  - 1 x daily - 7 x weekly - 3 sets - 10 reps  GOALS: Goals reviewed with patient? No   SHORT TERM GOALS: Target date: 12/14/2024  Patient will be independent in home exercise program to improve strength/mobility for better functional independence with ADLs. Baseline: 12/18/24: he's got it Goal status: MET  LONG TERM GOALS: 02/08/2025  Patient will increase ABC scale score >80% to demonstrate better functional mobility and  better confidence with ADLs.  Baseline: 46.25%; 12/18/24: 50.6% Goal status: PROGRESSING  2.  Patient (> 71 years old) will complete five times sit to stand test in < 15 seconds indicating an increased LE strength and improved balance. Baseline: 16.59s; 12/18/24: 12.43sec Goal status: PROGRESSING  3.  Patient will increase Berg Balance score by > 6 points to demonstrate decreased fall risk during functional activities Baseline: 50/56; 12/21/2024=53/56 Goal status: PROGRESSING  4.  Patient will increase 10 meter walk test to >1.7m/s as to improve gait speed for better community ambulation and to reduce fall risk. Baseline: 0.75 m/s with SPC; 12/21/2024= 1.1 m/s Goal status: INITIAL  5.  Patient will reduce timed up and go to <11 seconds to reduce fall risk and demonstrate improved transfer/gait ability. Baseline: 8.86s; 12/21/2024= 13.03 sec without AD Goal status: ONGOING   6. Pt will increase distance of by 150 ft in order to increase ambulation tolerance in community and public environments.  Baseline: 700' with SPC; 12/21/2024= 785' in 4 min 53 sec with SPC- Stopped due to some Left LBP and weakness.  Goal status: ONGOING  ASSESSMENT:  CLINICAL IMPRESSION:  Progress report visit today and patient well motivated to demonstrate progress. He continued to show progress as seen in 6 min walk test and BERG without significant issues. Concentrated on adding some balance activities in the areas that most need improvement including SLS and tandem. Patient's  condition has the potential to improve in response to therapy. Maximum improvement is yet to be obtained. The anticipated improvement is attainable and reasonable in a generally predictable time. Patient will benefit from skilled physical therapy intervention to reduce deficits and impairments identified in evaluation, in order to reduce pain, improve quality of life, and maximize activity tolerance for ADL, IADL, and leisure/fitness. Physical therapy will help pt achieve long and short term goals of care.   OBJECTIVE IMPAIRMENTS: Abnormal gait, decreased balance, decreased coordination, decreased endurance, decreased mobility, difficulty walking, decreased strength, dizziness, increased edema, impaired flexibility, impaired sensation, and pain.   ACTIVITY LIMITATIONS: carrying, lifting, bending, stairs, and transfers  PARTICIPATION LIMITATIONS: cleaning, driving, and yard work  PERSONAL FACTORS: Age, Past/current experiences, Time since onset of injury/illness/exacerbation, and 3+ comorbidities: HTN, anemia, neuropathy, depression, past joint replacement, B LE neuropathy, etc. are also affecting patient's functional outcome.   REHAB POTENTIAL: Good  CLINICAL DECISION MAKING: Evolving/moderate complexity  EVALUATION COMPLEXITY: Moderate  PLAN:  PT FREQUENCY: 2x/week  PT DURATION: 12 weeks  PLANNED INTERVENTIONS: [id number]- PT Re-evaluation, 97750- Physical Performance Testing, 97110-Therapeutic exercises, 97530- Therapeutic activity, 97112- Neuromuscular re-education, 97535- Self Care, [id number]- Manual therapy, [phone]- Gait training, [phone]- Orthotic Initial, [phone]- Orthotic/Prosthetic subsequent, [phone]- Canalith repositioning, [id number]- Aquatic Therapy, [phone]- Electrical stimulation (unattended), [phone] (1-2 muscles), [phone] (3+ muscles)- Dry Needling, Patient/Family education, Balance training, Stair training, Joint mobilization, Joint manipulation, Spinal mobilization, Vestibular training, DME  instructions, Wheelchair mobility training, Cryotherapy, and Moist heat  PLAN FOR NEXT SESSION:  -Continue working to improve activity tolerance, gait distance, and balance with progressive exercises to patient's tolerance level.  - address gluteal and core weakness   9:57 AM, 12/22/24 Chyrl London, PT Physical Therapist - [facility]  Outpatient Physical Therapy- Main Campus [phone]           "

## 2024-12-26 ENCOUNTER — Ambulatory Visit: Payer: Medicare (Managed Care)

## 2024-12-26 DIAGNOSIS — R260 Ataxic gait: Secondary | ICD-10-CM

## 2024-12-26 DIAGNOSIS — R293 Abnormal posture: Secondary | ICD-10-CM

## 2024-12-26 DIAGNOSIS — R2681 Unsteadiness on feet: Secondary | ICD-10-CM

## 2024-12-26 DIAGNOSIS — Z9181 History of falling: Secondary | ICD-10-CM

## 2024-12-26 NOTE — Therapy (Signed)
 " OUTPATIENT PHYSICAL THERAPY TREATMENT   Patient Name: Eric Andrews MRN: [id number] DOB:1951-06-26, 74 y.o., male Today's Date: 12/26/2024   PCP: Edman Marsa PARAS, DO  REFERRING PROVIDER: Lane Arthea BRAVO, MD   END OF SESSION:  PT End of Session - 12/26/24 1311     Visit Number 11    Number of Visits 24    Date for Recertification  02/08/25    Authorization Type [employer]    Authorization Time Period 11/16/24-02/08/2025    Progress Note Due on Visit 20    PT Start Time 1315    PT Stop Time 1359    PT Time Calculation (min) 44 min    Activity Tolerance Patient tolerated treatment well;No increased pain    Behavior During Therapy WFL for tasks assessed/performed            Past Medical History:  Diagnosis Date   ADD (attention deficit disorder)    Anemia    Anxiety    Arthritis    Depression    Diabetes mellitus without complication (HCC)    Enlarged prostate    GERD (gastroesophageal reflux disease)    History of kidney stones    Hypertension    Knee pain    Neuromuscular disorder (HCC)    lower legs- neuropathy   Sleep apnea    Past Surgical History:  Procedure Laterality Date   APPENDECTOMY     CARPEL TUNEL Bilateral    COLONOSCOPY W/ POLYPECTOMY     COLONOSCOPY WITH PROPOFOL  N/A 03/22/2023   Procedure: COLONOSCOPY WITH PROPOFOL ;  Surgeon: Therisa Bi, MD;  Location: [facility] ENDOSCOPY;  Service: Gastroenterology;  Laterality: N/A;   ESOPHAGOGASTRODUODENOSCOPY (EGD) WITH PROPOFOL  N/A 08/18/2023   Procedure: ESOPHAGOGASTRODUODENOSCOPY (EGD) WITH PROPOFOL ;  Surgeon: Therisa Bi, MD;  Location: [facility] ENDOSCOPY;  Service: Gastroenterology;  Laterality: N/A;   HERNIA REPAIR     Left   JOINT REPLACEMENT     KNEE ARTHROSCOPY     Left   RIGHT WRIST SX X2     TOTAL KNEE ARTHROPLASTY Left 04/21/2022   Procedure: TOTAL KNEE ARTHROPLASTY;  Surgeon: Kathlynn Sharper, MD;  Location: [facility] ORS;  Service: Orthopedics;  Laterality: Left;   TOTAL SHOULDER ARTHROPLASTY  Left 01/05/2023   Procedure: Left total shoulder arthroplasty, biceps tenodesis;  Surgeon: Tobie Priest, MD;  Location: [facility] ORS;  Service: Orthopedics;  Laterality: Left;   vascetomy     Patient Active Problem List   Diagnosis Date Noted   Atherosclerosis of aorta 01/03/2024   OSA on CPAP 10/29/2023   Abdominal pain, epigastric 08/18/2023   Colon cancer screening 03/22/2023   Adenomatous polyp of colon 03/22/2023   Osteoarthritis of glenohumeral joint, left 01/20/2023   Status post total replacement of left shoulder 01/20/2023   S/P TKR (total knee replacement) using cement, left 04/21/2022   Bilateral primary osteoarthritis of knee 12/17/2021   Gastroesophageal reflux disease without esophagitis 07/08/2021   Environmental and seasonal allergies 02/27/2021   Hyperlipidemia associated with type 2 diabetes mellitus (HCC) 02/27/2021   Morbid obesity with BMI of 45.0-49.9, adult (HCC) 06/26/2020   Major depressive disorder, recurrent, moderate (HCC) 06/25/2020   GAD (generalized anxiety disorder) 06/25/2020   Type 2 diabetes mellitus with other specified complication (HCC) 05/27/2020   Chronic pain of both knees 05/27/2020   Primary osteoarthritis involving multiple joints 05/27/2020   Cataract associated with type 2 diabetes mellitus (HCC) 05/27/2020   History of torn meniscus of left knee 05/27/2020   DDD (degenerative disc disease), lumbar 05/27/2020  ONSET DATE: about 3 years ago, 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 Reports his back and legs are bothering him.   PERTINENT HISTORY:  From AMB referral: Patient presenting with lower extremity pain, 6/10 and difficulty walking long distances. Ambulating with cane/ No neck pain or difficulty with cervical rotation. Hand pain is intermittent, wearing wrist braces. Increased unstable gait, imbalance. No recent falls. Difficulty ambulating long distances. Referral to PT with gait and balance deficits.  PAIN:  Are you having pain? 2/10   PRECAUTIONS: None  WEIGHT BEARING RESTRICTIONS: No  FALLS: Has patient fallen in last 6 months? 3 or 4 near falls in the 6 months, due to his legs giving out  LIVING ENVIRONMENT: Lives with: lives with their family and great grandson, granddaughter, and 3 daughters Lives in: House/apartment Stairs: Yes: Internal: one flight, only uses if he needs to do a job on second floor, but able to use stairs slowly steps; on left going up; one side of the house has a ramp which he mostly uses, and one side with steps Has following equipment at home: Single point cane, Walker - 2 wheeled, and [employer] - 4 wheeled, used the walkers in the past following a knee replacement  PLOF: Independent and was able to independently do yard work easier specifically  PATIENT GOALS: Improve balance, walking, LE pain, and back pain  OBJECTIVE:  Note: Objective measures were completed at Evaluation unless otherwise noted.  DIAGNOSTIC FINDINGS: From MRI of lumbar spine 02/13/21: Disc levels:   T12-L1: Mild degenerative change.  Negative for stenosis   L1-2: Mild degenerative change.  Negative for stenosis   L2-3: Moderate disc degeneration with disc space narrowing and spurring asymmetric to the left. Mild facet degeneration. Mild spinal stenosis. Mild subarticular stenosis bilaterally. Left lateral osteophyte formation. Left foramen mildly  narrowed.   L3-4: Disc degeneration with disc space narrowing and spurring asymmetric to the right. Moderate facet hypertrophy bilaterally. Mild to moderate spinal stenosis. Mild to moderate subarticular and foraminal stenosis on the right. Mild left subarticular stenosis.   L4-5:  Disc degeneration with diffuse bulging of the disc and small central disc protrusion. Moderate facet hypertrophy. Mild to moderate spinal stenosis. Moderate subarticular and foraminal stenosis bilaterally.   L5-S1: Disc degeneration with diffuse bulging of the disc. Moderate to advanced facet hypertrophy bilaterally. Moderate subarticular stenosis on the left and mild subarticular stenosis on the right.  IMPRESSION: Multilevel degenerative change throughout the lumbar spine. Multilevel spinal and foraminal stenosis as described above  COGNITION: Overall cognitive status: Within functional limits for tasks assessed   SENSATION: Light touch: Impaired  Tingling on whole L LE  LOWER EXTREMITY MMT:    MMT Right Eval Left Eval  Hip flexion 4- 4  Hip abduction 4- 4-  Hip adduction 5 5  Hip internal rotation    Hip external rotation    Knee flexion 5 5  Knee extension 5 5  Ankle dorsiflexion 5 5  Ankle plantarflexion    Ankle inversion    Ankle eversion    (Blank rows = not tested)  -Lumbar ROM: Flexion: fingertips to mid shin, no pain Rotation: around 50* B, no pain Extension: WFL   RAMP:  Findings: uses ramp at home, says it easier than steps  STAIRS: Not tested GAIT: Findings: Gait Characteristics: decreased arm swing- Right, decreased arm swing- Left, decreased step length- Right, decreased step length- Left, lateral hip instability, and wide BOS and Assistive device utilized:Single point cane  FUNCTIONAL TESTS:  Pt performed 5 time sit<>stand (5xSTS): 16.59 sec (>15 sec indicates increased fall risk)   PT instructed pt in TUG: 8.86s sec (average of 3 trials; >13.5 sec indicates  increased fall risk)    PATIENT SURVEYS:  ABC Scale: 46.25%; 12/18/24: 50.6%                                                                                                                             TREATMENT DATE: 12/26/2024 TA- To improve functional movements patterns for everyday tasks  -Sit<>stand with 15# kettle bell 2x 15 Seated TrA activation with green swiss ball 15x 5 second holds  GTB march with df 15x each LE  Seated adduction 15x, progressed to adduction with ER/IR 15x    NMR: To facilitate reeducation of movement, balance, posture, coordination, and/or proprioception/kinesthetic sense. Standing with CGA next to support surface:  Airex pad: static stand 30 seconds x 2 trials, noticeable trembling of ankles/LE's with fatigue and challenge to maintain stability Airex pad: horizontal head turns 30 seconds scanning room 10x ; cueing for arc of motion  Airex pad: vertical head turns 30 seconds, cueing for arc of motion, noticeable sway with upward gaze increasing demand on ankle righting reaction musculature Airex pad: eyes closed 30 seconds Airex pad: one foot on 6 step one foot on airex pad, hold position for 30 seconds, switch legs, 2x each LE; Airex pad 6 step toe taps 10x each LE  Airex pad: 5lb bar: chest press 10x, overhead press 10x    PATIENT EDUCATION: Education  details: Edu in HEP Person educated: Patient Education method: Explanation Education comprehension: verbalized understanding  HOME EXERCISE PROGRAM: Access Code: [email or web address] URL: [email or web address] Date: 12/22/2024 Prepared by: Reyes London  Exercises - Single Leg Stance  - 2 x weekly - 3 sets - as long as possible hold - Tandem Stance  - 2 x weekly - 3 sets - 30 sec hold   Access Code: [email or web address] URL: [email or web address] Date: 11/27/2024 Prepared by: Massie Dollar  Exercises - Seated Piriformis Stretch  - 1 x daily - 7 x weekly - 3 sets - 10 reps - 20 sec   hold - Seated Hamstring Stretch  - 1 x daily - 7 x weekly - 3 sets - 10 reps - 20 sec hold - Standing Hip Flexor Stretch  - 1 x daily - 7 x weekly - 3 sets - 10 reps - 20 sec  hold - Side Stepping with Resistance at Thighs and Counter Support  - 1 x daily - 4 x weekly - 3 sets - 5 reps - Seated Hip Abduction with Resistance  - 1 x daily - 7 x weekly - 3 sets - 10 reps  GOALS: Goals reviewed with patient? No   SHORT TERM GOALS: Target date: 12/14/2024  Patient will be independent in home exercise program to improve strength/mobility for better functional independence with ADLs. Baseline: 12/18/24: he's got it Goal status: MET  LONG TERM GOALS: 02/08/2025  Patient will increase ABC scale score >80% to demonstrate better functional mobility and better confidence with ADLs.  Baseline: 46.25%; 12/18/24: 50.6% Goal status: PROGRESSING  2.  Patient (> 86 years old) will complete five times sit to stand test in < 15 seconds indicating an increased LE strength and improved balance. Baseline: 16.59s; 12/18/24: 12.43sec Goal status: PROGRESSING  3.  Patient will increase Berg Balance score by > 6 points to demonstrate decreased fall risk during functional activities Baseline: 50/56; 12/21/2024=53/56 Goal status: PROGRESSING  4.  Patient will increase 10 meter walk test to >1.39m/s as to improve gait speed for better community ambulation and to reduce fall risk. Baseline: 0.75 m/s with SPC; 12/21/2024= 1.1 m/s Goal status: INITIAL  5.  Patient will reduce timed up and go to <11 seconds to reduce fall risk and demonstrate improved transfer/gait ability. Baseline: 8.86s; 12/21/2024= 13.03 sec without AD Goal status: ONGOING   6. Pt will increase distance of by 150 ft in order to increase ambulation tolerance in community and public environments.  Baseline: 700' with SPC; 12/21/2024= 785' in 4 min 53 sec with SPC- Stopped due to some Left LBP and weakness.  Goal status:  ONGOING  ASSESSMENT:  CLINICAL IMPRESSION:  Patient is highly motivated throughout session. Education on core strengthening tolerated well. Single leg stance on unstable surfaces of LLE challenging and an area of continued focus. Patient eager to add UE movement to session to challenge balance. Patient will benefit from skilled physical therapy intervention to reduce deficits and impairments identified in evaluation, in order to reduce pain, improve quality of life, and maximize activity tolerance for ADL, IADL, and leisure/fitness. Physical therapy will help pt achieve long and short term goals of care.   OBJECTIVE IMPAIRMENTS: Abnormal gait, decreased balance, decreased coordination, decreased endurance, decreased mobility, difficulty walking, decreased strength, dizziness, increased edema, impaired flexibility, impaired sensation, and pain.   ACTIVITY LIMITATIONS: carrying, lifting, bending, stairs, and transfers  PARTICIPATION LIMITATIONS: cleaning, driving, and yard work  PERSONAL FACTORS: Age, Past/current experiences, Time since onset of injury/illness/exacerbation, and 3+  comorbidities: HTN, anemia, neuropathy, depression, past joint replacement, B LE neuropathy, etc. are also affecting patient's functional outcome.   REHAB POTENTIAL: Good  CLINICAL DECISION MAKING: Evolving/moderate complexity  EVALUATION COMPLEXITY: Moderate  PLAN:  PT FREQUENCY: 2x/week  PT DURATION: 12 weeks  PLANNED INTERVENTIONS: 97164- PT Re-evaluation, 97750- Physical Performance Testing, 97110-Therapeutic exercises, 97530- Therapeutic activity, 97112- Neuromuscular re-education, 97535- Self Care, [id number]- Manual therapy, [phone]- Gait training, [phone]- Orthotic Initial, [phone]- Orthotic/Prosthetic subsequent, [phone]- Canalith repositioning, [id number]- Aquatic Therapy, [phone]- Electrical stimulation (unattended), [phone] (1-2 muscles), [phone] (3+ muscles)- Dry Needling, Patient/Family education, Balance training, Stair  training, Joint mobilization, Joint manipulation, Spinal mobilization, Vestibular training, DME instructions, Wheelchair mobility training, Cryotherapy, and Moist heat  PLAN FOR NEXT SESSION:  -Continue working to improve activity tolerance, gait distance, and balance with progressive exercises to patient's tolerance level.  - address gluteal and core weakness   2:47 PM, 12/26/24 Antion Andres  Leopoldo, PT, DPT Physical Therapist - [facility]  Outpatient Physical Therapy- Main Campus [phone]           "

## 2024-12-28 ENCOUNTER — Ambulatory Visit: Payer: Medicare (Managed Care) | Admitting: Physical Therapy

## 2024-12-28 DIAGNOSIS — R293 Abnormal posture: Secondary | ICD-10-CM | POA: Diagnosis not present

## 2024-12-28 DIAGNOSIS — R200 Anesthesia of skin: Secondary | ICD-10-CM

## 2024-12-28 DIAGNOSIS — Z9181 History of falling: Secondary | ICD-10-CM

## 2024-12-28 DIAGNOSIS — M5415 Radiculopathy, thoracolumbar region: Secondary | ICD-10-CM

## 2024-12-28 DIAGNOSIS — R2681 Unsteadiness on feet: Secondary | ICD-10-CM

## 2024-12-28 DIAGNOSIS — R260 Ataxic gait: Secondary | ICD-10-CM

## 2024-12-28 NOTE — Therapy (Signed)
 " OUTPATIENT PHYSICAL THERAPY TREATMENT   Patient Name: Eric Andrews MRN: [id number] DOB:1951/04/18, 74 y.o., male Today's Date: 12/28/2024   PCP: Edman Marsa PARAS, DO  REFERRING PROVIDER: Lane Arthea BRAVO, MD   END OF SESSION:  PT End of Session - 12/28/24 1307     Visit Number 12    Number of Visits 24    Date for Recertification  02/08/25    Authorization Type [employer]    Authorization Time Period 11/16/24-02/08/2025    Progress Note Due on Visit 20    PT Start Time 1315    PT Stop Time 1355    PT Time Calculation (min) 40 min    Activity Tolerance Patient tolerated treatment well;No increased pain    Behavior During Therapy WFL for tasks assessed/performed            Past Medical History:  Diagnosis Date   ADD (attention deficit disorder)    Anemia    Anxiety    Arthritis    Depression    Diabetes mellitus without complication (HCC)    Enlarged prostate    GERD (gastroesophageal reflux disease)    History of kidney stones    Hypertension    Knee pain    Neuromuscular disorder (HCC)    lower legs- neuropathy   Sleep apnea    Past Surgical History:  Procedure Laterality Date   APPENDECTOMY     CARPEL TUNEL Bilateral    COLONOSCOPY W/ POLYPECTOMY     COLONOSCOPY WITH PROPOFOL  N/A 03/22/2023   Procedure: COLONOSCOPY WITH PROPOFOL ;  Surgeon: Therisa Bi, MD;  Location: [facility] ENDOSCOPY;  Service: Gastroenterology;  Laterality: N/A;   ESOPHAGOGASTRODUODENOSCOPY (EGD) WITH PROPOFOL  N/A 08/18/2023   Procedure: ESOPHAGOGASTRODUODENOSCOPY (EGD) WITH PROPOFOL ;  Surgeon: Therisa Bi, MD;  Location: [facility] ENDOSCOPY;  Service: Gastroenterology;  Laterality: N/A;   HERNIA REPAIR     Left   JOINT REPLACEMENT     KNEE ARTHROSCOPY     Left   RIGHT WRIST SX X2     TOTAL KNEE ARTHROPLASTY Left 04/21/2022   Procedure: TOTAL KNEE ARTHROPLASTY;  Surgeon: Kathlynn Sharper, MD;  Location: [facility] ORS;  Service: Orthopedics;  Laterality: Left;   TOTAL SHOULDER ARTHROPLASTY  Left 01/05/2023   Procedure: Left total shoulder arthroplasty, biceps tenodesis;  Surgeon: Tobie Priest, MD;  Location: [facility] ORS;  Service: Orthopedics;  Laterality: Left;   vascetomy     Patient Active Problem List   Diagnosis Date Noted   Atherosclerosis of aorta 01/03/2024   OSA on CPAP 10/29/2023   Abdominal pain, epigastric 08/18/2023   Colon cancer screening 03/22/2023   Adenomatous polyp of colon 03/22/2023   Osteoarthritis of glenohumeral joint, left 01/20/2023   Status post total replacement of left shoulder 01/20/2023   S/P TKR (total knee replacement) using cement, left 04/21/2022   Bilateral primary osteoarthritis of knee 12/17/2021   Gastroesophageal reflux disease without esophagitis 07/08/2021   Environmental and seasonal allergies 02/27/2021   Hyperlipidemia associated with type 2 diabetes mellitus (HCC) 02/27/2021   Morbid obesity with BMI of 45.0-49.9, adult (HCC) 06/26/2020   Major depressive disorder, recurrent, moderate (HCC) 06/25/2020   GAD (generalized anxiety disorder) 06/25/2020   Type 2 diabetes mellitus with other specified complication (HCC) 05/27/2020   Chronic pain of both knees 05/27/2020   Primary osteoarthritis involving multiple joints 05/27/2020   Cataract associated with type 2 diabetes mellitus (HCC) 05/27/2020   History of torn meniscus of left knee 05/27/2020   DDD (degenerative disc disease), lumbar 05/27/2020  ONSET DATE: about 3 years ago, 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 Just a normal day. Pt reports that he has restarted pain medications, and now has no pain while at rest. Still having increased pain with mobility, but it is management.    PERTINENT HISTORY:  From AMB referral: Patient presenting with lower extremity pain, 6/10 and difficulty walking long distances. Ambulating with cane/ No neck pain or difficulty with cervical rotation. Hand pain is intermittent, wearing wrist braces. Increased unstable gait, imbalance. No recent falls. Difficulty ambulating long distances. Referral to PT with gait and balance deficits.  PAIN:  Are you having pain? 2/10   PRECAUTIONS: None  WEIGHT BEARING RESTRICTIONS: No  FALLS: Has patient fallen in last 6 months? 3 or 4 near falls in the 6 months, due to his legs giving out  LIVING ENVIRONMENT: Lives with: lives with their family and great grandson, granddaughter, and 3 daughters Lives in: House/apartment Stairs: Yes: Internal: one flight, only uses if he needs to do a job on second floor, but able to use stairs slowly steps; on left going up; one side of the house has a ramp which he mostly uses, and one side with steps Has following equipment at home: Single point cane, Walker - 2 wheeled, and [employer] - 4 wheeled, used the walkers in the past following a knee replacement  PLOF: Independent and was able to independently do yard work easier specifically  PATIENT GOALS: Improve balance, walking, LE pain, and back pain  OBJECTIVE:  Note: Objective measures were completed at Evaluation unless otherwise noted.  DIAGNOSTIC FINDINGS: From MRI of lumbar spine 02/13/21: Disc levels:   T12-L1: Mild degenerative change.  Negative for stenosis   L1-2: Mild degenerative change.  Negative for stenosis   L2-3: Moderate disc degeneration with disc space narrowing and spurring asymmetric to the left. Mild facet degeneration.  Mild spinal stenosis. Mild subarticular stenosis bilaterally. Left lateral osteophyte formation. Left foramen mildly narrowed.   L3-4: Disc degeneration with disc space narrowing and spurring asymmetric to the right. Moderate  facet hypertrophy bilaterally. Mild to moderate spinal stenosis. Mild to moderate subarticular and foraminal stenosis on the right. Mild left subarticular stenosis.   L4-5: Disc degeneration with diffuse bulging of the disc and small central disc protrusion. Moderate facet hypertrophy. Mild to moderate spinal stenosis. Moderate subarticular and foraminal stenosis bilaterally.   L5-S1: Disc degeneration with diffuse bulging of the disc. Moderate to advanced facet hypertrophy bilaterally. Moderate subarticular stenosis on the left and mild subarticular stenosis on the right.  IMPRESSION: Multilevel degenerative change throughout the lumbar spine. Multilevel spinal and foraminal stenosis as described above  COGNITION: Overall cognitive status: Within functional limits for tasks assessed   SENSATION: Light touch: Impaired  Tingling on whole L LE  LOWER EXTREMITY MMT:    MMT Right Eval Left Eval  Hip flexion 4- 4  Hip abduction 4- 4-  Hip adduction 5 5  Hip internal rotation    Hip external rotation    Knee flexion 5 5  Knee extension 5 5  Ankle dorsiflexion 5 5  Ankle plantarflexion    Ankle inversion    Ankle eversion    (Blank rows = not tested)  -Lumbar ROM: Flexion: fingertips to mid shin, no pain Rotation: around 50* B, no pain Extension: WFL   RAMP:  Findings: uses ramp at home, says it easier than steps  STAIRS: Not tested GAIT: Findings: Gait Characteristics: decreased arm swing- Right, decreased arm swing- Left, decreased step length- Right, decreased step length- Left, lateral hip instability, and wide BOS and Assistive device utilized:Single point cane  FUNCTIONAL TESTS:  Pt performed 5 time sit<>stand (5xSTS): 16.59 sec (>15  sec indicates increased fall risk)   PT instructed pt in TUG: 8.86s sec (average of 3 trials; >13.5 sec indicates increased fall risk)    PATIENT SURVEYS:  ABC Scale: 46.25%; 12/18/24: 50.6%                                                                                                                             TREATMENT DATE: 12/28/2024 TA- To improve functional movements patterns for everyday tasks  -Sit<>stand with 15# kettle bell 2x 15 Therapy ball rollout x 15 GTB march with df 15x 2each LE  Seated adduction 15x, 2 adduction with ER/IR 15x 2   NMR: To facilitate reeducation of movement, balance, posture, coordination, and/or proprioception/kinesthetic sense. Standing with CGA next to support surface:  -Airex pad:  static stand 30 seconds x 2 trials, -1 foot on airex pad, 1 foot elevated on 6inch step,  Static hold x 30 sec bil  - standing on airex pad:  Single limb tap on 1 of 2 hedge hogs on top of step x 8 each performed bil. X2 bout - 1 foot on airex pad, 1 foot elevated on airex pad on top of step:  static hold x 15 sec bil  Cross body reach 2 x 8 bil  CGA for safety with dynamic balance on airex pad unless otherwise  noted. Intermittent UE support for safety.   PATIENT EDUCATION: Education details: Edu in HEP Pt educated throughout session about proper posture and technique with exercises. Improved exercise technique, movement at target joints, use of target muscles after min to mod verbal, visual, tactile cues.  Person educated: Patient Education method: Explanation Education comprehension: verbalized understanding  HOME EXERCISE PROGRAM: Access Code: [email or web address] URL: [email or web address] Date: 12/22/2024 Prepared by: Reyes London  Exercises - Single Leg Stance  - 2 x weekly - 3 sets - as long as possible hold - Tandem Stance  - 2 x weekly - 3 sets - 30 sec hold   Access Code: [email or web address] URL: [email or web address] Date:  11/27/2024 Prepared by: Massie Dollar  Exercises - Seated Piriformis Stretch  - 1 x daily - 7 x weekly - 3 sets - 10 reps - 20 sec  hold - Seated Hamstring Stretch  - 1 x daily - 7 x weekly - 3 sets - 10 reps - 20 sec hold - Standing Hip Flexor Stretch  - 1 x daily - 7 x weekly - 3 sets - 10 reps - 20 sec  hold - Side Stepping with Resistance at Thighs and Counter Support  - 1 x daily - 4 x weekly - 3 sets - 5 reps - Seated Hip Abduction with Resistance  - 1 x daily - 7 x weekly - 3 sets - 10 reps  GOALS: Goals reviewed with patient? No   SHORT TERM GOALS: Target date: 12/14/2024  Patient will be independent in home exercise program to improve strength/mobility for better functional independence with ADLs. Baseline: 12/18/24: he's got it Goal status: MET  LONG TERM GOALS: 02/08/2025  Patient will increase ABC scale score >80% to demonstrate better functional mobility and better confidence with ADLs.  Baseline: 46.25%; 12/18/24: 50.6% Goal status: PROGRESSING  2.  Patient (> 58 years old) will complete five times sit to stand test in < 15 seconds indicating an increased LE strength and improved balance. Baseline: 16.59s; 12/18/24: 12.43sec Goal status: PROGRESSING  3.  Patient will increase Berg Balance score by > 6 points to demonstrate decreased fall risk during functional activities Baseline: 50/56; 12/21/2024=53/56 Goal status: PROGRESSING  4.  Patient will increase 10 meter walk test to >1.47m/s as to improve gait speed for better community ambulation and to reduce fall risk. Baseline: 0.75 m/s with SPC; 12/21/2024= 1.1 m/s Goal status: INITIAL  5.  Patient will reduce timed up and go to <11 seconds to reduce fall risk and demonstrate improved transfer/gait ability. Baseline: 8.86s; 12/21/2024= 13.03 sec without AD Goal status: ONGOING   6. Pt will increase distance of by 150 ft in order to increase ambulation tolerance in community and public environments.  Baseline: 700'  with SPC; 12/21/2024= 785' in 4 min 53 sec with SPC- Stopped due to some Left LBP and weakness.  Goal status: ONGOING  ASSESSMENT:  CLINICAL IMPRESSION:  Patient is highly motivated throughout session. Education on core strengthening tolerated well. Increased SLS and dynamic balance requirements with dynamic stepping and reaching from unstable surface. Increased hip and ankle stability requirements compared to prior sessions, with good adaptability between interventions. Patient will benefit from skilled physical therapy intervention to reduce deficits and impairments identified in evaluation, in order to reduce pain, improve quality of life, and maximize activity tolerance for ADL, IADL, and leisure/fitness. Physical therapy will help pt achieve long and short term goals of care.   OBJECTIVE IMPAIRMENTS: Abnormal gait, decreased balance, decreased coordination, decreased endurance, decreased  mobility, difficulty walking, decreased strength, dizziness, increased edema, impaired flexibility, impaired sensation, and pain.   ACTIVITY LIMITATIONS: carrying, lifting, bending, stairs, and transfers  PARTICIPATION LIMITATIONS: cleaning, driving, and yard work  PERSONAL FACTORS: Age, Past/current experiences, Time since onset of injury/illness/exacerbation, and 3+ comorbidities: HTN, anemia, neuropathy, depression, past joint replacement, B LE neuropathy, etc. are also affecting patient's functional outcome.   REHAB POTENTIAL: Good  CLINICAL DECISION MAKING: Evolving/moderate complexity  EVALUATION COMPLEXITY: Moderate  PLAN:  PT FREQUENCY: 2x/week  PT DURATION: 12 weeks  PLANNED INTERVENTIONS: [id number]- PT Re-evaluation, 97750- Physical Performance Testing, 97110-Therapeutic exercises, 97530- Therapeutic activity, 97112- Neuromuscular re-education, 97535- Self Care, [id number]- Manual therapy, [phone]- Gait training, [phone]- Orthotic Initial, [phone]- Orthotic/Prosthetic subsequent, [phone]- Canalith  repositioning, [id number]- Aquatic Therapy, [phone]- Electrical stimulation (unattended), [phone] (1-2 muscles), [id number] (3+ muscles)- Dry Needling, Patient/Family education, Balance training, Stair training, Joint mobilization, Joint manipulation, Spinal mobilization, Vestibular training, DME instructions, Wheelchair mobility training, Cryotherapy, and Moist heat  PLAN FOR NEXT SESSION:    -Continue working to improve activity tolerance, gait distance, and balance with progressive exercises to patient's tolerance level.  - address gluteal and core weakness   Massie Dollar PT, DPT  Physical Therapist - [facility]  2:02 PM 12/28/24          "

## 2025-01-01 ENCOUNTER — Other Ambulatory Visit: Payer: Medicare (Managed Care)

## 2025-01-02 ENCOUNTER — Other Ambulatory Visit: Payer: Medicare (Managed Care) | Admitting: Pharmacist

## 2025-01-02 ENCOUNTER — Ambulatory Visit: Payer: Medicare (Managed Care)

## 2025-01-02 DIAGNOSIS — R260 Ataxic gait: Secondary | ICD-10-CM

## 2025-01-02 DIAGNOSIS — Z7985 Long-term (current) use of injectable non-insulin antidiabetic drugs: Secondary | ICD-10-CM

## 2025-01-02 DIAGNOSIS — R2681 Unsteadiness on feet: Secondary | ICD-10-CM

## 2025-01-02 DIAGNOSIS — E1169 Type 2 diabetes mellitus with other specified complication: Secondary | ICD-10-CM

## 2025-01-02 DIAGNOSIS — Z794 Long term (current) use of insulin: Secondary | ICD-10-CM

## 2025-01-02 DIAGNOSIS — R293 Abnormal posture: Secondary | ICD-10-CM | POA: Diagnosis not present

## 2025-01-02 DIAGNOSIS — Z9181 History of falling: Secondary | ICD-10-CM

## 2025-01-02 MED ORDER — OZEMPIC (1 MG/DOSE) 4 MG/3ML ~~LOC~~ SOPN: 1.0000 mg | PEN_INJECTOR | SUBCUTANEOUS | 2 refills | Status: AC

## 2025-01-02 NOTE — Therapy (Signed)
 " OUTPATIENT PHYSICAL THERAPY TREATMENT   Patient Name: Eric Andrews MRN: [id number] DOB:1951/03/09, 74 y.o., male Today's Date: 01/02/2025   PCP: Edman Marsa PARAS, DO  REFERRING PROVIDER: Lane Arthea BRAVO, MD   END OF SESSION:  PT End of Session - 01/02/25 1312     Visit Number 13    Number of Visits 24    Date for Recertification  02/08/25    Authorization Type [employer]    Authorization Time Period 11/16/24-02/08/2025    Progress Note Due on Visit 20    PT Start Time 1315    PT Stop Time 1359    PT Time Calculation (min) 44 min    Activity Tolerance Patient tolerated treatment well;No increased pain    Behavior During Therapy WFL for tasks assessed/performed             Past Medical History:  Diagnosis Date   ADD (attention deficit disorder)    Anemia    Anxiety    Arthritis    Depression    Diabetes mellitus without complication (HCC)    Enlarged prostate    GERD (gastroesophageal reflux disease)    History of kidney stones    Hypertension    Knee pain    Neuromuscular disorder (HCC)    lower legs- neuropathy   Sleep apnea    Past Surgical History:  Procedure Laterality Date   APPENDECTOMY     CARPEL TUNEL Bilateral    COLONOSCOPY W/ POLYPECTOMY     COLONOSCOPY WITH PROPOFOL  N/A 03/22/2023   Procedure: COLONOSCOPY WITH PROPOFOL ;  Surgeon: Therisa Bi, MD;  Location: [facility] ENDOSCOPY;  Service: Gastroenterology;  Laterality: N/A;   ESOPHAGOGASTRODUODENOSCOPY (EGD) WITH PROPOFOL  N/A 08/18/2023   Procedure: ESOPHAGOGASTRODUODENOSCOPY (EGD) WITH PROPOFOL ;  Surgeon: Therisa Bi, MD;  Location: [facility] ENDOSCOPY;  Service: Gastroenterology;  Laterality: N/A;   HERNIA REPAIR     Left   JOINT REPLACEMENT     KNEE ARTHROSCOPY     Left   RIGHT WRIST SX X2     TOTAL KNEE ARTHROPLASTY Left 04/21/2022   Procedure: TOTAL KNEE ARTHROPLASTY;  Surgeon: Kathlynn Sharper, MD;  Location: [facility] ORS;  Service: Orthopedics;  Laterality: Left;   TOTAL SHOULDER ARTHROPLASTY  Left 01/05/2023   Procedure: Left total shoulder arthroplasty, biceps tenodesis;  Surgeon: Tobie Priest, MD;  Location: [facility] ORS;  Service: Orthopedics;  Laterality: Left;   vascetomy     Patient Active Problem List   Diagnosis Date Noted   Atherosclerosis of aorta 01/03/2024   OSA on CPAP 10/29/2023   Abdominal pain, epigastric 08/18/2023   Colon cancer screening 03/22/2023   Adenomatous polyp of colon 03/22/2023   Osteoarthritis of glenohumeral joint, left 01/20/2023   Status post total replacement of left shoulder 01/20/2023   S/P TKR (total knee replacement) using cement, left 04/21/2022   Bilateral primary osteoarthritis of knee 12/17/2021   Gastroesophageal reflux disease without esophagitis 07/08/2021   Environmental and seasonal allergies 02/27/2021   Hyperlipidemia associated with type 2 diabetes mellitus (HCC) 02/27/2021   Morbid obesity with BMI of 45.0-49.9, adult (HCC) 06/26/2020   Major depressive disorder, recurrent, moderate (HCC) 06/25/2020   GAD (generalized anxiety disorder) 06/25/2020   Type 2 diabetes mellitus with other specified complication (HCC) 05/27/2020   Chronic pain of both knees 05/27/2020   Primary osteoarthritis involving multiple joints 05/27/2020   Cataract associated with type 2 diabetes mellitus (HCC) 05/27/2020   History of torn meniscus of left knee 05/27/2020   DDD (degenerative disc disease), lumbar 05/27/2020  ONSET DATE: about 3 years ago, 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    PERTINENT HISTORY:  From [facility] referral: Patient presenting with lower extremity pain, 6/10 and difficulty walking long distances. Ambulating with cane/ No neck pain or difficulty with cervical rotation. Hand pain is intermittent, wearing wrist braces. Increased unstable gait, imbalance. No recent falls. Difficulty ambulating long distances. Referral to PT with gait and balance deficits.  PAIN:  Are you having pain? 2/10   PRECAUTIONS: None  WEIGHT BEARING RESTRICTIONS: No  FALLS: Has patient fallen in last 6 months? 3 or 4 near falls in the 6 months, due to his legs giving out  LIVING ENVIRONMENT: Lives with: lives with their family and great grandson, granddaughter, and 3 daughters Lives in: House/apartment Stairs: Yes: Internal: one flight, only uses if he needs to do a job on second floor, but able to use stairs slowly steps; on left going up; one side of the house has a ramp which he mostly uses, and one side with steps Has following equipment at home: Single point cane, Walker - 2 wheeled, and [employer] - 4 wheeled, used the walkers in the past following a knee replacement  PLOF: Independent and was able to independently do yard work easier specifically  PATIENT GOALS: Improve balance, walking, LE pain, and back pain  OBJECTIVE:  Note: Objective measures were completed at Evaluation unless otherwise noted.  DIAGNOSTIC FINDINGS: From MRI of lumbar spine 02/13/21: Disc levels:   T12-L1: Mild degenerative change.  Negative for stenosis   L1-2: Mild degenerative change.  Negative for stenosis   L2-3: Moderate disc degeneration with disc space narrowing and spurring asymmetric to the left. Mild facet degeneration. Mild spinal stenosis. Mild subarticular stenosis bilaterally. Left lateral osteophyte formation. Left foramen mildly narrowed.   L3-4: Disc  degeneration with disc space narrowing and spurring asymmetric to the right. Moderate facet hypertrophy bilaterally. Mild to moderate spinal stenosis. Mild to moderate subarticular and foraminal stenosis on the right. Mild left subarticular stenosis.   L4-5: Disc degeneration with  diffuse bulging of the disc and small central disc protrusion. Moderate facet hypertrophy. Mild to moderate spinal stenosis. Moderate subarticular and foraminal stenosis bilaterally.   L5-S1: Disc degeneration with diffuse bulging of the disc. Moderate to advanced facet hypertrophy bilaterally. Moderate subarticular stenosis on the left and mild subarticular stenosis on the right.  IMPRESSION: Multilevel degenerative change throughout the lumbar spine. Multilevel spinal and foraminal stenosis as described above  COGNITION: Overall cognitive status: Within functional limits for tasks assessed   SENSATION: Light touch: Impaired  Tingling on whole L LE  LOWER EXTREMITY MMT:    MMT Right Eval Left Eval  Hip flexion 4- 4  Hip abduction 4- 4-  Hip adduction 5 5  Hip internal rotation    Hip external rotation    Knee flexion 5 5  Knee extension 5 5  Ankle dorsiflexion 5 5  Ankle plantarflexion    Ankle inversion    Ankle eversion    (Blank rows = not tested)  -Lumbar ROM: Flexion: fingertips to mid shin, no pain Rotation: around 50* B, no pain Extension: WFL   RAMP:  Findings: uses ramp at home, says it easier than steps  STAIRS: Not tested GAIT: Findings: Gait Characteristics: decreased arm swing- Right, decreased arm swing- Left, decreased step length- Right, decreased step length- Left, lateral hip instability, and wide BOS and Assistive device utilized:Single point cane  FUNCTIONAL TESTS:  Pt performed 5 time sit<>stand (5xSTS): 16.59 sec (>15 sec indicates increased fall risk)   PT instructed pt in TUG: 8.86s sec (average of 3 trials; >13.5 sec indicates increased fall risk)     PATIENT SURVEYS:  ABC Scale: 46.25%; 12/18/24: 50.6%                                                                                                                             TREATMENT DATE: 01/02/2025 TA- To improve functional movements patterns for everyday tasks  -Sit<>stand 15x Therapy ball rollout x 15   5lb AW 3 way hip hike 10x each direction   Bosu ball: round side up: -step up 10x each LE; UE support  -lateral step up; 10x each side; UE support   NMR: To facilitate reeducation of movement, balance, posture, coordination, and/or proprioception/kinesthetic sense.  Standing with CGA next to support surface:  Airex pad: static stand with eyes closed 30 seconds x 2 trials, noticeable trembling of ankles/LE's with fatigue and challenge to maintain stability Airex pad: horizontal head turns 30 seconds scanning room 10x ; cueing for arc of motion  Airex pad: vertical head turns 30 seconds, cueing for arc of motion, noticeable sway with upward gaze increasing demand on ankle righting reaction musculature Airex pad: one foot on 6 step one foot on airex pad, hold position for 30 seconds, switch legs, 2x each LE; Airex: dual task with letters x multiple minutes with focus on reaching and visual scans   PATIENT EDUCATION: Education details: Edu in HEP Pt educated throughout  session about proper posture and technique with exercises. Improved exercise technique, movement at target joints, use of target muscles after min to mod verbal, visual, tactile cues.  Person educated: Patient Education method: Explanation Education comprehension: verbalized understanding  HOME EXERCISE PROGRAM: Access Code: [email or web address] URL: [email or web address] Date: 12/22/2024 Prepared by: Reyes London  Exercises - Single Leg Stance  - 2 x weekly - 3 sets - as long as possible hold - Tandem Stance  - 2 x weekly - 3 sets - 30 sec hold   Access Code: [email or web address] URL:  [email or web address] Date: 11/27/2024 Prepared by: Massie Dollar  Exercises - Seated Piriformis Stretch  - 1 x daily - 7 x weekly - 3 sets - 10 reps - 20 sec  hold - Seated Hamstring Stretch  - 1 x daily - 7 x weekly - 3 sets - 10 reps - 20 sec hold - Standing Hip Flexor Stretch  - 1 x daily - 7 x weekly - 3 sets - 10 reps - 20 sec  hold - Side Stepping with Resistance at Thighs and Counter Support  - 1 x daily - 4 x weekly - 3 sets - 5 reps - Seated Hip Abduction with Resistance  - 1 x daily - 7 x weekly - 3 sets - 10 reps  GOALS: Goals reviewed with patient? No   SHORT TERM GOALS: Target date: 12/14/2024  Patient will be independent in home exercise program to improve strength/mobility for better functional independence with ADLs. Baseline: 12/18/24: he's got it Goal status: MET  LONG TERM GOALS: 02/08/2025  Patient will increase ABC scale score >80% to demonstrate better functional mobility and better confidence with ADLs.  Baseline: 46.25%; 12/18/24: 50.6% Goal status: PROGRESSING  2.  Patient (> 72 years old) will complete five times sit to stand test in < 15 seconds indicating an increased LE strength and improved balance. Baseline: 16.59s; 12/18/24: 12.43sec Goal status: PROGRESSING  3.  Patient will increase Berg Balance score by > 6 points to demonstrate decreased fall risk during functional activities Baseline: 50/56; 12/21/2024=53/56 Goal status: PROGRESSING  4.  Patient will increase 10 meter walk test to >1.31m/s as to improve gait speed for better community ambulation and to reduce fall risk. Baseline: 0.75 m/s with SPC; 12/21/2024= 1.1 m/s Goal status: INITIAL  5.  Patient will reduce timed up and go to <11 seconds to reduce fall risk and demonstrate improved transfer/gait ability. Baseline: 8.86s; 12/21/2024= 13.03 sec without AD Goal status: ONGOING   6. Pt will increase distance of by 150 ft in order to increase ambulation tolerance in community  and public environments.  Baseline: 700' with SPC; 12/21/2024= 785' in 4 min 53 sec with SPC- Stopped due to some Left LBP and weakness.  Goal status: ONGOING  ASSESSMENT:  CLINICAL IMPRESSION:  Patient introduced to bosu ball with round side up. He is challenged with this new interventions requiring UE support from support bar. He is highly  motivated throughout session. Eyes closed on airex pad is very challenging with increased trembling of LE's.  Patient will benefit from skilled physical therapy intervention to reduce deficits and impairments identified in evaluation, in order to reduce pain, improve quality of life, and maximize activity tolerance for ADL, IADL, and leisure/fitness. Physical therapy will help pt achieve long and short term goals of care.   OBJECTIVE IMPAIRMENTS: Abnormal gait, decreased balance, decreased coordination, decreased endurance, decreased mobility, difficulty walking, decreased strength, dizziness, increased edema, impaired flexibility, impaired sensation, and pain.  ACTIVITY LIMITATIONS: carrying, lifting, bending, stairs, and transfers  PARTICIPATION LIMITATIONS: cleaning, driving, and yard work  PERSONAL FACTORS: Age, Past/current experiences, Time since onset of injury/illness/exacerbation, and 3+ comorbidities: HTN, anemia, neuropathy, depression, past joint replacement, B LE neuropathy, etc. are also affecting patient's functional outcome.   REHAB POTENTIAL: Good  CLINICAL DECISION MAKING: Evolving/moderate complexity  EVALUATION COMPLEXITY: Moderate  PLAN:  PT FREQUENCY: 2x/week  PT DURATION: 12 weeks  PLANNED INTERVENTIONS: 97164- PT Re-evaluation, 97750- Physical Performance Testing, 97110-Therapeutic exercises, 97530- Therapeutic activity, 97112- Neuromuscular re-education, 97535- Self Care, [id number]- Manual therapy, [phone]- Gait training, [phone]- Orthotic Initial, [phone]- Orthotic/Prosthetic subsequent, [phone]- Canalith repositioning, [id number]- Aquatic  Therapy, [phone]- Electrical stimulation (unattended), [phone] (1-2 muscles), [id number] (3+ muscles)- Dry Needling, Patient/Family education, Balance training, Stair training, Joint mobilization, Joint manipulation, Spinal mobilization, Vestibular training, DME instructions, Wheelchair mobility training, Cryotherapy, and Moist heat  PLAN FOR NEXT SESSION:    -Continue working to improve activity tolerance, gait distance, and balance with progressive exercises to patient's tolerance level.  - address gluteal and core weakness  Khale Nigh  Leopoldo, PT, DPT Physical Therapist - [facility] - [facility]  Outpatient Physical Therapy- Main Campus [phone]    2:46 PM 01/02/25          "

## 2025-01-02 NOTE — Progress Notes (Signed)
 "  01/02/2025 Name: Eric Andrews MRN: [id number] DOB: 01/04/1951  Chief Complaint  Patient presents with   Medication Management   Medication Assistance    Sincere Edelen is a 74 y.o. year old male who presented for a telephone visit.   They were referred to the pharmacist by their PCP for assistance in managing medication access.      Subjective:   Care Team: Primary Care Provider: Edman Marsa PARAS, DO; Next Scheduled Visit: 02/27/2025 Urology: Twylla Glendia BROCKS, MD Neurology: Lane Arthea Locus, MD [facility] Pulmonology: Tamea Dedra CROME, MD  Medication Access/Adherence  Current Pharmacy:  [facility] [id number] [place], [place] - [address] AT [facility] [address] [place] [address] [place] [address] Phone: [phone] Fax: [phone]  [facility] - [place], [place] - [address] [place] [address] Phone: [phone] Fax: [phone]   Patient reports affordability concerns with their medications: No  Patient reports access/transportation concerns to their pharmacy: No  Patient reports adherence concerns with their medications:  No       Diabetes:   Current medications:  Tresiba  50 units daily in evening Novolog  10-12 units three times daily with meals. Currently 10 units with breakfast & lunch and 12 units with supper Metformin  ER 500 mg - 2 tablets (1000 mg) twice daily  Farxiga  10 mg daily  Ozempic  1 mg weekly    Patient using Freestyle Libre 3 Plus Continuous Glucose. Connect today via Libreview   Patient notes that his last sensor ended last night and does not yet have a new sensor due to the weather. Patient is monitoring with glucometer for now, but plans to start a new sensor as soon is able to pick up - Reports morning fasting blood sugar today: 106  Review recent results for 12/19/2024 to 01/01/2025:   Date of Download: 01/02/25 % Time CGM is active:  100% Average Glucose: 166 mg/dL Glucose Management Indicator: 7.3%  Glucose Variability: 17.2% (goal <36%) Time in Goal:  - Time in range 70-180: 69% - Time above range: 31% - Time below range: 0%          Patient denies hypoglycemic s/sx including dizziness, shakiness, sweating - Reports carries glucose tablets in case needed to treat low blood sugar    Notices that he is snacking more when inside due to weather/harder to keep busy. Strategies: working with tools outdoors.     Statin: atorvastatin  20 mg daily    Current medication access support:  Patient enrolled in assistance program from [employer] for Novolog , Tresiba  and pen needles through 12/06/2025 [employer] will no longer offer Ozempic  to Medicare beneficiaries through their Patient Assistance Program in 2026.  Patient collaborating with CPhT Suzen Mall for assistant with applying for re-enrollment in patient assistance for Farxiga  from [employer] for 2026    Objective:  Lab Results  Component Value Date   HGBA1C 7.1 (A) 08/24/2024    Lab Results  Component Value Date   CREATININE 0.85 02/14/2024   BUN 25 02/14/2024   NA 142 02/14/2024   K 4.0 02/14/2024   CL 101 02/14/2024   CO2 34 (H) 02/14/2024    Lab Results  Component Value Date   CHOL 116 02/14/2024   HDL 35 (L) 02/14/2024   LDLCALC 56 02/14/2024   TRIG 177 (H) 02/14/2024   CHOLHDL 3.3 02/14/2024    Medications Reviewed Today  Reviewed by Alana Sharyle LABOR, RPH-CPP (Pharmacist) on 01/02/25 at 1738  Med List Status: <None>   Medication Order Taking? Sig Documenting Provider Last Dose Status Informant  Ascorbic Acid  (VITAMIN C) 1000 MG tablet [id number]  Take 1,000 mg by mouth daily. [provider]  Active Self  aspirin  EC 81 MG tablet [id number]  Take 81 mg by mouth daily. Swallow whole. [provider]  Active   atorvastatin  (LIPITOR) 20 MG tablet [id number]  Take 1 tablet (20 mg total) by mouth daily.  Edman Marsa PARAS, DO  Active   busPIRone  (BUSPAR ) 15 MG tablet [id number]  Take 15 mg by mouth daily. [provider]  Active   celecoxib  (CELEBREX ) 200 MG capsule [id number]  TAKE 1 CAPSULE(200 MG) BY MOUTH TWICE DAILY Edman Marsa PARAS, DO  Active   cetirizine  (ZYRTEC ) 10 MG tablet [id number]  TAKE 1 TABLET(10 MG) BY MOUTH AT BEDTIME Edman Marsa PARAS, DO  Active   chlorthalidone  (HYGROTON ) 25 MG tablet [id number]  TAKE 1 TABLET(25 MG) BY MOUTH DAILY Karamalegos, Marsa PARAS, DO  Active   cholecalciferol  (VITAMIN D3) 25 MCG (1000 UNIT) tablet [id number]  Take 1,000 Units by mouth in the morning and at bedtime. [provider]  Active Self  Continuous Glucose Sensor (FREESTYLE LIBRE 3 PLUS SENSOR) MISC [id number]  Place 1 sensor on the skin every 15 days. Use to check glucose continuously Edman Marsa PARAS, DO  Active   dicyclomine  (BENTYL ) 10 MG capsule [id number]  TAKE 1 CAPSULE(10 MG) BY MOUTH FOUR TIMES DAILY BEFORE MEALS AND AT BEDTIME AS NEEDED Edman Marsa PARAS, DO  Active   FARXIGA  10 MG TABS tablet [id number] Yes Take 1 tablet (10 mg total) by mouth daily before breakfast. Edman Marsa PARAS, DO  Active   fenofibrate  micronized (LOFIBRA) 134 MG capsule [id number]  TAKE 1 CAPSULE(134 MG) BY MOUTH DAILY BEFORE BREAKFAST Karamalegos, Marsa PARAS, DO  Active   finasteride  (PROSCAR ) 5 MG tablet [id number]  TAKE 1 TABLET(5 MG) BY MOUTH EVERY EVENING Karamalegos, Marsa PARAS, DO  Active   fluticasone  (FLONASE ) 50 MCG/ACT nasal spray [id number]  SHAKE LIQUID AND USE 2 SPRAYS IN EACH NOSTRIL EVERY DAY Karamalegos, Marsa PARAS, DO  Active   gabapentin  (NEURONTIN ) 600 MG tablet [id number]  Take 600 mg by mouth in the morning, at noon, in the evening, and at bedtime. Taking in morning, evening and middle of the night  Patient taking differently: Take 600 mg by mouth 3 (three) times daily. Taking in morning, evening and middle of the night   [provider]  Expired 10/14/24 2359 Self           Med Note SOILA LYLE BROCKS   Tue Apr 07, 2022  5:10 PM)    hydrocortisone  2.5 % cream [id number]  APPLY TOPICALLY TO THE AFFECTED AREA OF FACE AND EARS 3 DAYS A WEEK ON MONDAYS, WEDNESDAYS, FRIDAYS Kowalski, David C, MD  Active   insulin  degludec (TRESIBA  FLEXTOUCH) 100 UNIT/ML FlexTouch Pen [id number]  Inject 68 Units into the skin at bedtime.  Patient taking differently: Inject 50 Units into the skin every evening.   Edman Marsa PARAS, DO  Active Self           Med Note [place], Lasheika Ortloff A   Fri Oct 15, 2023 11:20 AM)    Insulin  Pen Needle 31G X 5 MM MISC [id number]  Use with insulin  to inject into skin 3 times daily as directed Edman Marsa PARAS, DO  Active   ketoconazole  (NIZORAL )  2 % cream [id number]  APPLY EXTERNALLY TO THE AFFECTED AREA OF FACE AND EARS 3 days a week on TUESDAY, THURSDAY, AND SATURDAY Hester Alm BROCKS, MD  Active   ketoconazole  (NIZORAL ) 2 % shampoo [id number]  apply 2 to 3 times per week, massage into scalp and leave in for 5 minutes before rinsing out Hester Alm BROCKS, MD  Active   losartan  (COZAAR ) 50 MG tablet [id number]  TAKE 1 TABLET(50 MG) BY MOUTH DAILY Edman, Marsa PARAS, DO  Active   metFORMIN  (GLUCOPHAGE -XR) 500 MG 24 hr tablet [id number] Yes Take 2 tablets (1,000 mg total) by mouth 2 (two) times daily with a meal. Edman, Marsa PARAS, DO  Active   Multiple Vitamin (MULTIVITAMIN) capsule [id number]  Take 1 capsule by mouth daily. [provider]  Active Self  NOVOLOG  FLEXPEN 100 UNIT/ML FlexPen [id number] Yes Inject 12-14 Units into the skin 2 (two) times daily. Sliding scale  Patient taking differently: Inject 10-12 Units into the skin 3 (three) times daily with meals. Sliding scale   Edman Marsa PARAS, DO  Active Self  Omega-3 Fatty Acids (FISH OIL) 1200 MG CAPS [id number]  Take 1,200 mg by mouth in the morning and at bedtime. [provider]  Active Self  omeprazole   (PRILOSEC) 20 MG capsule [id number]  TAKE 1 CAPSULE BY MOUTH DAILY BEFORE BREAKFAST Karamalegos, Marsa PARAS, DO  Active   OZEMPIC , 1 MG/DOSE, 4 MG/3ML SOPN [id number] Yes Inject 1 mg into the skin once a week. Edman Marsa PARAS, DO  Active Self  pregabalin (LYRICA) 50 MG capsule [id number]  Take 50 mg by mouth in the morning, at noon, in the evening, and at bedtime. [provider]  Active   QUEtiapine (SEROQUEL) 25 MG tablet [id number]  Take 25 mg by mouth at bedtime. [provider]  Active   tamsulosin  (FLOMAX ) 0.4 MG CAPS capsule [id number]  TAKE 1 CAPSULE BY MOUTH EVERY NIGHT AT BEDTIME FOR URINE RETENTION Edman Marsa PARAS, DO  Active   traMADol  (ULTRAM ) 50 MG tablet [id number]  Take 50 mg by mouth 3 (three) times daily as needed. [provider]  Active            Med Note [place], [facility] A   Fri Oct 15, 2023 10:58 AM)    traZODone  (DESYREL ) 100 MG tablet [id number]  Take 1.5 tablets (150 mg total) by mouth at bedtime. Edman Marsa PARAS, DO  Active   venlafaxine  XR (EFFEXOR -XR) 150 MG 24 hr capsule [id number]  TAKE 1 CAPSULE(150 MG) BY MOUTH DAILY WITH BREAKFAST Karamalegos, Marsa PARAS, DO  Active               Assessment/Plan:   Review with patient that per [employer] website, the [employer] plan has a $295 annual prescription deductible (impacting tiers 3-5) and tier 3 copayment of $47/month (for medications such as Ozempic ) in 2026. Note out of pocket prescription drug cost in 2026 is capped at $2,100. - Will send renewal of Ozempic  1 mg weekly prescription to [facility] for patient as requested as patient plans to purchase this through his prescription coverage once runs out of current supply from patient assistance program      Diabetes: - Currently controlled - Reviewed dietary modifications including importance of having regular well-balanced meals and snacks throughout the day, while  controlling carbohydrate portion sizes  Discuss strategies to aid with dietary habits while spending more time indoors, including keeping busy with indoor puzzles. Also discuss having balanced snacks  available and limiting portion sizes. - Recommend to restart Freestyle Libre 3 Plus CGM to monitor blood sugar/as feedback on dietary choices Recommend to check glucose with fingerstick check when needed for symptoms and as back up to CGM.  Patient to contact office if needed for readings outside of established parameters or symptoms - Review importance of monitoring for signs of hypoglycemia  Encourage patient to continue to carry glucose tablets with him - Patient to follow up with [employer] patient assistance as needed for refills of Farxiga  and with [employer] patient assistance as needed for refills of Novolog , Tresiba  and pen needles - Patient collaborating with CPhT Suzen Mall for assistant with applying for re-enrollment in patient assistance for Farxiga  from [employer] for 2026 Plans shares that he mailed requested income documents back to CPhT last week   Follow Up Plan:  Clinical Pharmacist will follow up with patient by telephone on 04/23/2025 at 10:30 AM     Sharyle Sia, PharmD, JAQUELINE, CPP Clinical Pharmacist [facility] [phone]   "

## 2025-01-02 NOTE — Patient Instructions (Signed)
 Goals Addressed             This Visit's Progress    Pharmacy Goals       If you need to reach out to patient assistance programs regarding refills or to find out the status of your application, you can do so by calling:  [employer] at [phone] [employer] at [phone]  Our goal A1c is less than 7%. This corresponds with fasting sugars less than 130 and 2 hour after meal sugars less than 180. Please check your blood sugar and keep a log of the results  Please check your home blood pressure, keep a log of the results and bring this with you to your medical appointments.  Our goal bad cholesterol, or LDL, is less than 70 . This is why it is important to continue taking your atorvastatin.  Estelle Grumbles, PharmD, [facility] Clinical Pharmacist  [facility] [phone]

## 2025-01-04 ENCOUNTER — Ambulatory Visit: Payer: Medicare (Managed Care) | Admitting: Physical Therapy

## 2025-01-04 DIAGNOSIS — M5415 Radiculopathy, thoracolumbar region: Secondary | ICD-10-CM

## 2025-01-04 DIAGNOSIS — R260 Ataxic gait: Secondary | ICD-10-CM

## 2025-01-04 DIAGNOSIS — Z9181 History of falling: Secondary | ICD-10-CM

## 2025-01-04 DIAGNOSIS — R293 Abnormal posture: Secondary | ICD-10-CM | POA: Diagnosis not present

## 2025-01-04 DIAGNOSIS — R2681 Unsteadiness on feet: Secondary | ICD-10-CM

## 2025-01-04 NOTE — Therapy (Signed)
 " OUTPATIENT PHYSICAL THERAPY TREATMENT   Patient Name: Eric Andrews MRN: [id number] DOB:03-09-1951, 74 y.o., male Today's Date: 01/04/2025   PCP: Edman Marsa PARAS, DO  REFERRING PROVIDER: Lane Arthea BRAVO, MD   END OF SESSION:  PT End of Session - 01/04/25 1315     Visit Number 14    Number of Visits 24    Date for Recertification  02/08/25    Authorization Type [employer]    Authorization Time Period 11/16/24-02/08/2025    Progress Note Due on Visit 20    PT Start Time 1315    PT Stop Time 1355    PT Time Calculation (min) 40 min    Activity Tolerance Patient tolerated treatment well;No increased pain    Behavior During Therapy WFL for tasks assessed/performed             Past Medical History:  Diagnosis Date   ADD (attention deficit disorder)    Anemia    Anxiety    Arthritis    Depression    Diabetes mellitus without complication (HCC)    Enlarged prostate    GERD (gastroesophageal reflux disease)    History of kidney stones    Hypertension    Knee pain    Neuromuscular disorder (HCC)    lower legs- neuropathy   Sleep apnea    Past Surgical History:  Procedure Laterality Date   APPENDECTOMY     CARPEL TUNEL Bilateral    COLONOSCOPY W/ POLYPECTOMY     COLONOSCOPY WITH PROPOFOL  N/A 03/22/2023   Procedure: COLONOSCOPY WITH PROPOFOL ;  Surgeon: Therisa Bi, MD;  Location: [facility] ENDOSCOPY;  Service: Gastroenterology;  Laterality: N/A;   ESOPHAGOGASTRODUODENOSCOPY (EGD) WITH PROPOFOL  N/A 08/18/2023   Procedure: ESOPHAGOGASTRODUODENOSCOPY (EGD) WITH PROPOFOL ;  Surgeon: Therisa Bi, MD;  Location: [facility] ENDOSCOPY;  Service: Gastroenterology;  Laterality: N/A;   HERNIA REPAIR     Left   JOINT REPLACEMENT     KNEE ARTHROSCOPY     Left   RIGHT WRIST SX X2     TOTAL KNEE ARTHROPLASTY Left 04/21/2022   Procedure: TOTAL KNEE ARTHROPLASTY;  Surgeon: Kathlynn Sharper, MD;  Location: [facility] ORS;  Service: Orthopedics;  Laterality: Left;   TOTAL SHOULDER ARTHROPLASTY  Left 01/05/2023   Procedure: Left total shoulder arthroplasty, biceps tenodesis;  Surgeon: Tobie Priest, MD;  Location: [facility] ORS;  Service: Orthopedics;  Laterality: Left;   vascetomy     Patient Active Problem List   Diagnosis Date Noted   Atherosclerosis of aorta 01/03/2024   OSA on CPAP 10/29/2023   Abdominal pain, epigastric 08/18/2023   Colon cancer screening 03/22/2023   Adenomatous polyp of colon 03/22/2023   Osteoarthritis of glenohumeral joint, left 01/20/2023   Status post total replacement of left shoulder 01/20/2023   S/P TKR (total knee replacement) using cement, left 04/21/2022   Bilateral primary osteoarthritis of knee 12/17/2021   Gastroesophageal reflux disease without esophagitis 07/08/2021   Environmental and seasonal allergies 02/27/2021   Hyperlipidemia associated with type 2 diabetes mellitus (HCC) 02/27/2021   Morbid obesity with BMI of 45.0-49.9, adult (HCC) 06/26/2020   Major depressive disorder, recurrent, moderate (HCC) 06/25/2020   GAD (generalized anxiety disorder) 06/25/2020   Type 2 diabetes mellitus with other specified complication (HCC) 05/27/2020   Chronic pain of both knees 05/27/2020   Primary osteoarthritis involving multiple joints 05/27/2020   Cataract associated with type 2 diabetes mellitus (HCC) 05/27/2020   History of torn meniscus of left knee 05/27/2020   DDD (degenerative disc disease), lumbar 05/27/2020  ONSET DATE: about 3 years ago, 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 States that the back has been sore this week from lifting he performed over the weekend. Just feels like he is moving a little slow today. Like maybe I am a cup of coffee short   PERTINENT HISTORY:  From AMB referral: Patient presenting with lower extremity pain, 6/10 and difficulty walking long distances. Ambulating with cane/ No neck pain or difficulty with cervical rotation. Hand pain is intermittent, wearing wrist braces. Increased unstable gait, imbalance. No recent falls. Difficulty ambulating long distances. Referral to PT with gait and balance deficits.  PAIN:  Are you having pain? 2/10   PRECAUTIONS: None  WEIGHT BEARING RESTRICTIONS: No  FALLS: Has patient fallen in last 6 months? 3 or 4 near falls in the 6 months, due to his legs giving out  LIVING ENVIRONMENT: Lives with: lives with their family and great grandson, granddaughter, and 3 daughters Lives in: House/apartment Stairs: Yes: Internal: one flight, only uses if he needs to do a job on second floor, but able to use stairs slowly steps; on left going up; one side of the house has a ramp which he mostly uses, and one side with steps Has following equipment at home: Single point cane, Walker - 2 wheeled, and [employer] - 4 wheeled, used the walkers in the past following a knee replacement  PLOF: Independent and was able to independently do yard work easier specifically  PATIENT GOALS: Improve balance, walking, LE pain, and back pain  OBJECTIVE:  Note: Objective measures were completed at Evaluation unless otherwise noted.  DIAGNOSTIC FINDINGS: From MRI of lumbar spine 02/13/21: Disc levels:   T12-L1: Mild degenerative change.  Negative for stenosis   L1-2: Mild degenerative change.  Negative for stenosis   L2-3: Moderate disc degeneration with disc space narrowing and spurring asymmetric to the left. Mild facet  degeneration. Mild spinal stenosis. Mild subarticular stenosis bilaterally. Left lateral osteophyte formation. Left foramen mildly narrowed.   L3-4: Disc degeneration with disc space narrowing and  spurring asymmetric to the right. Moderate facet hypertrophy bilaterally. Mild to moderate spinal stenosis. Mild to moderate subarticular and foraminal stenosis on the right. Mild left subarticular stenosis.   L4-5: Disc degeneration with diffuse bulging of the disc and small central disc protrusion. Moderate facet hypertrophy. Mild to moderate spinal stenosis. Moderate subarticular and foraminal stenosis bilaterally.   L5-S1: Disc degeneration with diffuse bulging of the disc. Moderate to advanced facet hypertrophy bilaterally. Moderate subarticular stenosis on the left and mild subarticular stenosis on the right.  IMPRESSION: Multilevel degenerative change throughout the lumbar spine. Multilevel spinal and foraminal stenosis as described above  COGNITION: Overall cognitive status: Within functional limits for tasks assessed   SENSATION: Light touch: Impaired  Tingling on whole L LE  LOWER EXTREMITY MMT:    MMT Right Eval Left Eval  Hip flexion 4- 4  Hip abduction 4- 4-  Hip adduction 5 5  Hip internal rotation    Hip external rotation    Knee flexion 5 5  Knee extension 5 5  Ankle dorsiflexion 5 5  Ankle plantarflexion    Ankle inversion    Ankle eversion    (Blank rows = not tested)  -Lumbar ROM: Flexion: fingertips to mid shin, no pain Rotation: around 50* B, no pain Extension: WFL   RAMP:  Findings: uses ramp at home, says it easier than steps  STAIRS: Not tested GAIT: Findings: Gait Characteristics: decreased arm swing- Right, decreased arm swing- Left, decreased step length- Right, decreased step length- Left, lateral hip instability, and wide BOS and Assistive device utilized:Single point cane  FUNCTIONAL TESTS:  Pt performed 5 time sit<>stand (5xSTS):  16.59 sec (>15 sec indicates increased fall risk)   PT instructed pt in TUG: 8.86s sec (average of 3 trials; >13.5 sec indicates increased fall risk)    PATIENT SURVEYS:  ABC Scale: 46.25%; 12/18/24: 50.6%                                                                                                                             TREATMENT DATE: 01/04/2025  Standing on airex beam x  .   Side stepping on airex beam x 5 laps  Tandem gait on airex beam x 5 laps   Obstacle course navigation to walk across wide airex beam, over 3 half bolsters, over 6 inch step, over 4 inch step and weave through 4 cones. Performed 3 laps.   Then performed obstacle course to step over bolsters, up/down 4 and 6 inch steps and around 4 cones listed above carrying 15# kettle bell 2 laps with RUE and 2 laps with LUE.   Standing on airex beam cross body reach to place and remove wooden shapes on pole.  SLS from level surface to tap on 2 cones.   Side stepping on airex beam without UE support   Throughout session PT provided CGA to supervision assist for safety except on tandem gait on airex beam requiring occasional min assist.  PATIENT EDUCATION: Education details: Edu in HEP Pt educated throughout session about proper posture and technique with exercises. Improved exercise technique, movement at target joints, use of target muscles after min to mod verbal, visual, tactile cues.  Person educated: Patient Education method: Explanation Education comprehension: verbalized understanding  HOME EXERCISE PROGRAM: Access Code: [email or web address] URL: [email or web address] Date: 12/22/2024 Prepared by: Reyes London  Exercises - Single Leg Stance  - 2 x weekly - 3 sets - as long as possible hold - Tandem Stance  - 2 x weekly - 3 sets - 30 sec hold   Access Code: [email or web address] URL: [email or web address] Date: 11/27/2024 Prepared by: Massie Dollar  Exercises - Seated Piriformis Stretch   - 1 x daily - 7 x weekly - 3 sets - 10 reps - 20 sec  hold - Seated Hamstring Stretch  - 1 x daily - 7 x weekly - 3 sets - 10 reps - 20 sec hold - Standing Hip Flexor Stretch  - 1 x daily - 7 x weekly - 3 sets - 10 reps - 20 sec  hold - Side Stepping with Resistance at Thighs and Counter Support  - 1 x daily - 4 x weekly - 3 sets - 5 reps - Seated Hip Abduction with Resistance  - 1 x daily - 7 x weekly - 3 sets - 10 reps  GOALS: Goals reviewed with patient? No   SHORT TERM GOALS: Target date: 12/14/2024  Patient will be independent in home exercise program to improve strength/mobility for better functional independence with ADLs. Baseline: 12/18/24: he's got it Goal status: MET  LONG TERM GOALS: 02/08/2025  Patient will increase ABC scale score >80% to demonstrate better functional mobility and better confidence with ADLs.  Baseline: 46.25%; 12/18/24: 50.6% Goal status: PROGRESSING  2.  Patient (> 74 years old) will complete five times sit to stand test in < 15 seconds indicating an increased LE strength and improved balance. Baseline: 16.59s; 12/18/24: 12.43sec Goal status: PROGRESSING  3.  Patient will increase Berg Balance score by > 6 points to demonstrate decreased fall risk during functional activities Baseline: 50/56; 12/21/2024=53/56 Goal status: PROGRESSING  4.  Patient will increase 10 meter walk test to >1.33m/s as to improve gait speed for better community ambulation and to reduce fall risk. Baseline: 0.75 m/s with SPC; 12/21/2024= 1.1 m/s Goal status: INITIAL  5.  Patient will reduce timed up and go to <11 seconds to reduce fall risk and demonstrate improved transfer/gait ability. Baseline: 8.86s; 12/21/2024= 13.03 sec without AD Goal status: ONGOING   6. Pt will increase distance of by 150 ft in order to increase ambulation tolerance in community and public environments.  Baseline: 700' with SPC; 12/21/2024= 785' in 4 min 53 sec with SPC- Stopped due to some Left LBP  and weakness.  Goal status: ONGOING  ASSESSMENT:  CLINICAL IMPRESSION:  PT treatment focused on improved dynamic balance and righting reactions. Reports that he is feeling a little off today, like I didn't have enough coffee. But put forth great effort to all interventions.  Greatest difficulty with tandem gait on airex beam requiring min assist from PT to prevent lateral loss of balance. Is noted to have reduced ankle stability on the LLE and with standing on airex pad and in SLS to perform foot tap to unstable surface, but Improved use of ankle  strategy for righting reactions with SLS and on unlevel surface with increased practice and repetitions.  Patient will benefit from skilled physical therapy intervention  to reduce deficits and impairments identified in evaluation, in order to reduce pain, improve quality of life, and maximize activity tolerance for ADL, IADL, and leisure/fitness. Physical therapy will help pt achieve long and short term goals of care.   OBJECTIVE IMPAIRMENTS: Abnormal gait, decreased balance, decreased coordination, decreased endurance, decreased mobility, difficulty walking, decreased strength, dizziness, increased edema, impaired flexibility, impaired sensation, and pain.   ACTIVITY LIMITATIONS: carrying, lifting, bending, stairs, and transfers  PARTICIPATION LIMITATIONS: cleaning, driving, and yard work  PERSONAL FACTORS: Age, Past/current experiences, Time since onset of injury/illness/exacerbation, and 3+ comorbidities: HTN, anemia, neuropathy, depression, past joint replacement, B LE neuropathy, etc. are also affecting patient's functional outcome.   REHAB POTENTIAL: Good  CLINICAL DECISION MAKING: Evolving/moderate complexity  EVALUATION COMPLEXITY: Moderate  PLAN:  PT FREQUENCY: 2x/week  PT DURATION: 12 weeks  PLANNED INTERVENTIONS: 97164- PT Re-evaluation, 97750- Physical Performance Testing, 97110-Therapeutic exercises, 97530- Therapeutic activity,  97112- Neuromuscular re-education, 97535- Self Care, [id number]- Manual therapy, [phone]- Gait training, [phone]- Orthotic Initial, [phone]- Orthotic/Prosthetic subsequent, [phone]- Canalith repositioning, [id number]- Aquatic Therapy, [phone]- Electrical stimulation (unattended), [phone] (1-2 muscles), [phone] (3+ muscles)- Dry Needling, Patient/Family education, Balance training, Stair training, Joint mobilization, Joint manipulation, Spinal mobilization, Vestibular training, DME instructions, Wheelchair mobility training, Cryotherapy, and Moist heat  PLAN FOR NEXT SESSION:    -Continue working to improve activity tolerance, gait distance, and balance with progressive exercises to patient's tolerance level.  - address gluteal and core weakness     Massie Dollar PT, DPT  Physical Therapist - [facility]  4:06 PM 01/04/25    1:16 PM 01/04/25          "

## 2025-01-08 ENCOUNTER — Other Ambulatory Visit: Payer: Self-pay | Admitting: Family Medicine

## 2025-01-08 DIAGNOSIS — J3089 Other allergic rhinitis: Secondary | ICD-10-CM

## 2025-01-09 ENCOUNTER — Ambulatory Visit: Payer: Medicare (Managed Care) | Admitting: Physical Therapy

## 2025-01-09 DIAGNOSIS — Z9181 History of falling: Secondary | ICD-10-CM

## 2025-01-09 DIAGNOSIS — R2681 Unsteadiness on feet: Secondary | ICD-10-CM

## 2025-01-09 DIAGNOSIS — R293 Abnormal posture: Secondary | ICD-10-CM

## 2025-01-09 DIAGNOSIS — R260 Ataxic gait: Secondary | ICD-10-CM

## 2025-01-09 NOTE — Telephone Encounter (Signed)
 Requested Prescriptions  Pending Prescriptions Disp Refills   cetirizine  (ZYRTEC ) 10 MG tablet [Pharmacy Med Name: CETIRIZINE  10MG  TABLETS] 90 tablet 0    Sig: TAKE 1 TABLET(10 MG) BY MOUTH AT BEDTIME     Ear, Nose, and Throat:  Antihistamines 2 Passed - 01/09/2025 12:43 PM      Passed - Cr in normal range and within 360 days    Creat  Date Value Ref Range Status  02/14/2024 0.85 0.70 - 1.28 mg/dL Final         Passed - Valid encounter within last 12 months    Recent Outpatient Visits           4 months ago Type 2 diabetes mellitus with other specified complication, with long-term current use of insulin  [facility])   [facility] [place], Marsa PARAS, DO   5 months ago Chronic left-sided low back pain with left-sided sciatica   [facility] Edman Marsa PARAS, DO   10 months ago Annual physical exam   [facility] Edman Marsa PARAS, DO       Future Appointments             In 2 months Hester Alm BROCKS, MD [facility]   In 3 months Stoioff, Glendia BROCKS, MD [facility] [place]

## 2025-01-10 ENCOUNTER — Other Ambulatory Visit: Payer: Self-pay

## 2025-01-10 DIAGNOSIS — E1169 Type 2 diabetes mellitus with other specified complication: Secondary | ICD-10-CM

## 2025-01-10 MED ORDER — INSULIN PEN NEEDLE 31G X 5 MM MISC: 3 refills | Status: AC

## 2025-01-11 ENCOUNTER — Other Ambulatory Visit: Payer: Self-pay | Admitting: Family Medicine

## 2025-01-11 ENCOUNTER — Other Ambulatory Visit (HOSPITAL_COMMUNITY): Payer: Self-pay

## 2025-01-11 ENCOUNTER — Ambulatory Visit: Payer: Medicare (Managed Care)

## 2025-01-11 ENCOUNTER — Telehealth: Payer: Self-pay

## 2025-01-11 DIAGNOSIS — R293 Abnormal posture: Secondary | ICD-10-CM

## 2025-01-11 DIAGNOSIS — I10 Essential (primary) hypertension: Secondary | ICD-10-CM

## 2025-01-11 DIAGNOSIS — R2681 Unsteadiness on feet: Secondary | ICD-10-CM

## 2025-01-11 DIAGNOSIS — R260 Ataxic gait: Secondary | ICD-10-CM

## 2025-01-11 DIAGNOSIS — Z9181 History of falling: Secondary | ICD-10-CM

## 2025-01-11 NOTE — Therapy (Signed)
 " OUTPATIENT PHYSICAL THERAPY TREATMENT   Patient Name: Eric Andrews MRN: [id number] DOB:[birth year], 74 y.o., male Today's Date: 01/11/2025   PCP: Edman Marsa PARAS, DO  REFERRING PROVIDER: Lane Arthea BRAVO, MD   END OF SESSION:  PT End of Session - 01/11/25 1048     Visit Number 16    Number of Visits 24    Date for Recertification  02/08/25    Authorization Type [employer]    Authorization Time Period 11/16/24-02/08/2025    Progress Note Due on Visit 20    PT Start Time 1100    PT Stop Time 1144    PT Time Calculation (min) 44 min    Equipment Utilized During Treatment Gait belt    Activity Tolerance Patient tolerated treatment well;No increased pain    Behavior During Therapy WFL for tasks assessed/performed              Past Medical History:  Diagnosis Date   ADD (attention deficit disorder)    Anemia    Anxiety    Arthritis    Depression    Diabetes mellitus without complication (HCC)    Enlarged prostate    GERD (gastroesophageal reflux disease)    History of kidney stones    Hypertension    Knee pain    Neuromuscular disorder (HCC)    lower legs- neuropathy   Sleep apnea    Past Surgical History:  Procedure Laterality Date   APPENDECTOMY     CARPEL TUNEL Bilateral    COLONOSCOPY W/ POLYPECTOMY     COLONOSCOPY WITH PROPOFOL  N/A 03/22/2023   Procedure: COLONOSCOPY WITH PROPOFOL ;  Surgeon: Therisa Bi, MD;  Location: [facility] ENDOSCOPY;  Service: Gastroenterology;  Laterality: N/A;   ESOPHAGOGASTRODUODENOSCOPY (EGD) WITH PROPOFOL  N/A 08/18/2023   Procedure: ESOPHAGOGASTRODUODENOSCOPY (EGD) WITH PROPOFOL ;  Surgeon: Therisa Bi, MD;  Location: [facility] ENDOSCOPY;  Service: Gastroenterology;  Laterality: N/A;   HERNIA REPAIR     Left   JOINT REPLACEMENT     KNEE ARTHROSCOPY     Left   RIGHT WRIST SX X2     TOTAL KNEE ARTHROPLASTY Left 04/21/2022   Procedure: TOTAL KNEE ARTHROPLASTY;  Surgeon: Kathlynn Sharper, MD;  Location: [facility] ORS;  Service:  Orthopedics;  Laterality: Left;   TOTAL SHOULDER ARTHROPLASTY Left 01/05/2023   Procedure: Left total shoulder arthroplasty, biceps tenodesis;  Surgeon: Tobie Priest, MD;  Location: [facility] ORS;  Service: Orthopedics;  Laterality: Left;   vascetomy     Patient Active Problem List   Diagnosis Date Noted   Atherosclerosis of aorta 01/03/2024   OSA on CPAP 10/29/2023   Abdominal pain, epigastric 08/18/2023   Colon cancer screening 03/22/2023   Adenomatous polyp of colon 03/22/2023   Osteoarthritis of glenohumeral joint, left 01/20/2023   Status post total replacement of left shoulder 01/20/2023   S/P TKR (total knee replacement) using cement, left 04/21/2022   Bilateral primary osteoarthritis of knee 12/17/2021   Gastroesophageal reflux disease without esophagitis 07/08/2021   Environmental and seasonal allergies 02/27/2021   Hyperlipidemia associated with type 2 diabetes mellitus (HCC) 02/27/2021   Morbid obesity with BMI of 45.0-49.9, adult (HCC) 06/26/2020   Major depressive disorder, recurrent, moderate (HCC) 06/25/2020   GAD (generalized anxiety disorder) 06/25/2020   Type 2 diabetes mellitus with other specified complication (HCC) 05/27/2020   Chronic pain of both knees 05/27/2020   Primary osteoarthritis involving multiple joints 05/27/2020   Cataract associated with type 2 diabetes mellitus (HCC) 05/27/2020   History of torn meniscus of left  knee 05/27/2020   DDD (degenerative disc disease), lumbar 05/27/2020    ONSET DATE: about 3 years ago, one year after he retired from working as a heavy research scientist (physical sciences)  REFERRING DIAG:   R26.89 (ICD-10-CM) - Imbalance    THERAPY DIAG:  Abnormal posture  History of falling  Ataxic gait  Unsteadiness on feet  Rationale for Evaluation and Treatment: Rehabilitation  SUBJECTIVE:                                                                                                                                                                                              SUBJECTIVE STATEMENT: Patient came at wrong time, feels physically ok.     PERTINENT HISTORY:  From AMB referral: Patient presenting with lower extremity pain, 6/10 and difficulty walking long distances. Ambulating with cane/ No neck pain or difficulty with cervical rotation. Hand pain is intermittent, wearing wrist braces. Increased unstable gait, imbalance. No recent falls. Difficulty ambulating long distances. Referral to PT with gait and balance deficits.  PAIN:  Are you having pain? 2/10   PRECAUTIONS: None  WEIGHT BEARING RESTRICTIONS: No  FALLS: Has patient fallen in last 6 months? 3 or 4 near falls in the 6 months, due to his legs giving out  LIVING ENVIRONMENT: Lives with: lives with their family and great grandson, granddaughter, and 3 daughters Lives in: House/apartment Stairs: Yes: Internal: one flight, only uses if he needs to do a job on second floor, but able to use stairs slowly steps; on left going up; one side of the house has a ramp which he mostly uses, and one side with steps Has following equipment at home: Single point cane, Walker - 2 wheeled, and [employer] - 4 wheeled, used the walkers in the past following a knee replacement  PLOF: Independent and was able to independently do yard work easier specifically  PATIENT GOALS: Improve balance, walking, LE pain, and back pain  OBJECTIVE:  Note: Objective measures were completed at Evaluation unless otherwise noted.  DIAGNOSTIC FINDINGS: From MRI of lumbar spine 02/13/21: Disc levels:   T12-L1: Mild degenerative change.  Negative for stenosis   L1-2: Mild degenerative change.  Negative for stenosis   L2-3: Moderate disc degeneration with disc space narrowing and spurring asymmetric to the left. Mild facet degeneration. Mild spinal stenosis. Mild subarticular stenosis bilaterally. Left lateral osteophyte formation. Left foramen mildly narrowed.   L3-4: Disc degeneration with  disc space narrowing and spurring asymmetric to the right. Moderate facet hypertrophy bilaterally. Mild to moderate spinal stenosis. Mild to moderate subarticular and foraminal stenosis on the right. Mild left subarticular stenosis.  L4-5: Disc degeneration with diffuse bulging of the disc and small central disc protrusion. Moderate facet hypertrophy. Mild to moderate spinal stenosis. Moderate subarticular and foraminal stenosis bilaterally.   L5-S1: Disc degeneration with diffuse bulging of the disc. Moderate to advanced facet hypertrophy bilaterally. Moderate subarticular stenosis on the left and mild subarticular stenosis on the right.  IMPRESSION: Multilevel degenerative change throughout the lumbar spine. Multilevel spinal and foraminal stenosis as described above  COGNITION: Overall cognitive status: Within functional limits for tasks assessed   SENSATION: Light touch: Impaired  Tingling on whole L LE  LOWER EXTREMITY MMT:    MMT Right Eval Left Eval  Hip flexion 4- 4  Hip abduction 4- 4-  Hip adduction 5 5  Hip internal rotation    Hip external rotation    Knee flexion 5 5  Knee extension 5 5  Ankle dorsiflexion 5 5  Ankle plantarflexion    Ankle inversion    Ankle eversion    (Blank rows = not tested)  -Lumbar ROM: Flexion: fingertips to mid shin, no pain Rotation: around 50* B, no pain Extension: WFL   RAMP:  Findings: uses ramp at home, says it easier than steps  STAIRS: Not tested GAIT: Findings: Gait Characteristics: decreased arm swing- Right, decreased arm swing- Left, decreased step length- Right, decreased step length- Left, lateral hip instability, and wide BOS and Assistive device utilized:Single point cane  FUNCTIONAL TESTS:  Pt performed 5 time sit<>stand (5xSTS): 16.59 sec (>15 sec indicates increased fall risk)   PT instructed pt in TUG: 8.86s sec (average of 3 trials; >13.5 sec indicates increased fall risk)    PATIENT SURVEYS:   ABC Scale: 46.25%; 12/18/24: 50.6%                                                                                                                             TREATMENT DATE: 01/11/2025 TA- To improve functional movements patterns for everyday tasks  Airex pad 6 step airex pad: lateral step up/down 10 each direction Airex pad 6 step airex pad sandwhich, forward step up/down 10x each LE -LLE more challenging than RLE 6 step eccentric step down 10 each LE -very challenging Airex pad sit to stand 10x   Ambulate in hallway: -self toss ball 2 x 86 ft  -ball bounce to self 2x 86 ft; reports leg and back are tired and painful   NMR: To facilitate reeducation of movement, balance, posture, coordination, and/or proprioception/kinesthetic sense. Airex pad dynadisc: tandem stance 2x30 seconds Modified single leg stance with opp LE on soccer ball 2 30 seconds  TE- To improve strength, endurance, mobility, and function of specific targeted muscle groups or improve joint range of motion or improve muscle flexibility Adduction squeeze 10x5 second holds  Adduction with ER/IR 10x each LE  GTB march with band around toes for DF; 10x  Single leg abduction with knee extension 10x each LE Posture pectoral stretch 10x   Throughout session PT  provided CGA to supervision assist for safety except on tandem gait on airex beam requiring occasional min assist.   PATIENT EDUCATION: Education details: Edu in HEP Pt educated throughout session about proper posture and technique with exercises. Improved exercise technique, movement at target joints, use of target muscles after min to mod verbal, visual, tactile cues.  Person educated: Patient Education method: Explanation Education comprehension: verbalized understanding  HOME EXERCISE PROGRAM: Access Code: [email or web address] URL: [email or web address] Date: 12/22/2024 Prepared by: Reyes London  Exercises - Single Leg Stance  - 2 x weekly - 3  sets - as long as possible hold - Tandem Stance  - 2 x weekly - 3 sets - 30 sec hold   Access Code: [email or web address] URL: [email or web address] Date: 11/27/2024 Prepared by: Massie Dollar  Exercises - Seated Piriformis Stretch  - 1 x daily - 7 x weekly - 3 sets - 10 reps - 20 sec  hold - Seated Hamstring Stretch  - 1 x daily - 7 x weekly - 3 sets - 10 reps - 20 sec hold - Standing Hip Flexor Stretch  - 1 x daily - 7 x weekly - 3 sets - 10 reps - 20 sec  hold - Side Stepping with Resistance at Thighs and Counter Support  - 1 x daily - 4 x weekly - 3 sets - 5 reps - Seated Hip Abduction with Resistance  - 1 x daily - 7 x weekly - 3 sets - 10 reps  GOALS: Goals reviewed with patient? No   SHORT TERM GOALS: Target date: 12/14/2024  Patient will be independent in home exercise program to improve strength/mobility for better functional independence with ADLs. Baseline: 12/18/24: he's got it Goal status: MET  LONG TERM GOALS: 02/08/2025  Patient will increase ABC scale score >80% to demonstrate better functional mobility and better confidence with ADLs.  Baseline: 46.25%; 12/18/24: 50.6% Goal status: PROGRESSING  2.  Patient (> 74 years old) will complete five times sit to stand test in < 15 seconds indicating an increased LE strength and improved balance. Baseline: 16.59s; 12/18/24: 12.43sec Goal status: MET  3.  Patient will increase Berg Balance score by 6 points to demonstrate decreased fall risk during functional activities Baseline: 50/56; 12/21/2024=53/56 Goal status: PROGRESSING  4.  Patient will increase 10 meter walk test to >1.41m/s as to improve gait speed for better community ambulation and to reduce fall risk. Baseline: 0.75 m/s with SPC; 12/21/2024= 1.1 m/s Goal status: MET  5.  Patient will reduce timed up and go to <11 seconds to reduce fall risk and demonstrate improved transfer/gait ability. Baseline: 8.86s; 12/21/2024= 13.03 sec without AD Goal status: ONGOING    6. Pt will increase distance of by 150 ft in order to increase ambulation tolerance in community and public environments.  Baseline: 700' with SPC; 12/21/2024= 785' in 4 min 53 sec with SPC- Stopped due to some Left LBP and weakness.  Goal status: ONGOING  ASSESSMENT:  CLINICAL IMPRESSION:  Eccentric heel taps are very challenging for patient with LLE more challenged than RLE. Noticeable strength difference between R and LLE noted with steps and single leg stance throughout session. Patient highly motivated throughout session, is challenged with ambulation with dual task and will continue to benefit form high level stability exercise. Physical therapy will help pt achieve long and short term goals of care.   OBJECTIVE IMPAIRMENTS: Abnormal gait, decreased balance, decreased coordination, decreased endurance, decreased mobility, difficulty walking, decreased strength, dizziness, increased edema, impaired flexibility, impaired sensation,  and pain.   ACTIVITY LIMITATIONS: carrying, lifting, bending, stairs, and transfers  PARTICIPATION LIMITATIONS: cleaning, driving, and yard work  PERSONAL FACTORS: Age, Past/current experiences, Time since onset of injury/illness/exacerbation, and 3+ comorbidities: HTN, anemia, neuropathy, depression, past joint replacement, B LE neuropathy, etc. are also affecting patient's functional outcome.   REHAB POTENTIAL: Good  CLINICAL DECISION MAKING: Evolving/moderate complexity  EVALUATION COMPLEXITY: Moderate  PLAN:  PT FREQUENCY: 2x/week  PT DURATION: 12 weeks  PLANNED INTERVENTIONS: 97164- PT Re-evaluation, 97750- Physical Performance Testing, 97110-Therapeutic exercises, 97530- Therapeutic activity, 97112- Neuromuscular re-education, 97535- Self Care, [id number]- Manual therapy, [phone]- Gait training, [phone]- Orthotic Initial, [phone]- Orthotic/Prosthetic subsequent, [phone]- Canalith repositioning, [id number]- Aquatic Therapy, [phone]- Electrical stimulation  (unattended), [phone] (1-2 muscles), [phone] (3+ muscles)- Dry Needling, Patient/Family education, Balance training, Stair training, Joint mobilization, Joint manipulation, Spinal mobilization, Vestibular training, DME instructions, Wheelchair mobility training, Cryotherapy, and Moist heat  PLAN FOR NEXT SESSION:    -Continue working to improve activity tolerance, gait distance, and balance with progressive exercises to patient's tolerance level.  - address gluteal and core weakness   Tris Howell  Leopoldo PT ,DPT Physical Therapist- [place]  [facility]   11:45 AM 01/11/25            "

## 2025-01-11 NOTE — Telephone Encounter (Signed)
 Pharmacy Patient Advocate Encounter   Received notification from [facility] KEY that prior authorization for Droplet pen needles 31G x 5 mm is required/requested.   Insurance verification completed.   The patient is insured through [employer].   Per test claim: PA required; PA submitted to above mentioned insurance via Latent Key/confirmation #/EOC [id number] Status is pending

## 2025-01-12 NOTE — Telephone Encounter (Signed)
 Requested medication (s) are due for refill today: yes  Requested medication (s) are on the active medication list: yes  Last refill:  09/21/24 #90  Future visit scheduled: no  Notes to clinic:  overdue lab work   Requested Prescriptions  Pending Prescriptions Disp Refills   chlorthalidone  (HYGROTON ) 25 MG tablet [Pharmacy Med Name: CHLORTHALIDONE  25MG  TABLETS] 90 tablet 0    Sig: TAKE 1 TABLET(25 MG) BY MOUTH DAILY     Cardiovascular: Diuretics - Thiazide Failed - 01/12/2025  3:58 PM      Failed - Cr in normal range and within 180 days    Creat  Date Value Ref Range Status  02/14/2024 0.85 0.70 - 1.28 mg/dL Final         Failed - K in normal range and within 180 days    Potassium  Date Value Ref Range Status  02/14/2024 4.0 3.5 - 5.3 mmol/L Final         Failed - Na in normal range and within 180 days    Sodium  Date Value Ref Range Status  02/14/2024 142 135 - 146 mmol/L Final  05/19/2023 141 134 - 144 mmol/L Final         Passed - Last BP in normal range    BP Readings from Last 1 Encounters:  08/24/24 124/70         Passed - Valid encounter within last 6 months    Recent Outpatient Visits           4 months ago Type 2 diabetes mellitus with other specified complication, with long-term current use of insulin  [facility])   [place] [facility] Edman Marsa PARAS, DO   5 months ago Chronic left-sided low back pain with left-sided sciatica   [place] [facility] Edman Marsa PARAS, DO   10 months ago Annual physical exam   [place] [facility] Edman Marsa PARAS, DO       Future Appointments             In 2 months Hester Alm BROCKS, MD [facility]   In 3 months Stoioff, Glendia BROCKS, MD [facility] [place]

## 2025-01-16 ENCOUNTER — Ambulatory Visit: Payer: Medicare (Managed Care) | Admitting: Physical Therapy

## 2025-01-18 ENCOUNTER — Ambulatory Visit: Payer: Medicare (Managed Care)

## 2025-01-23 ENCOUNTER — Ambulatory Visit: Payer: Medicare (Managed Care)

## 2025-01-25 ENCOUNTER — Ambulatory Visit: Payer: Medicare (Managed Care) | Admitting: Physical Therapy

## 2025-01-26 ENCOUNTER — Ambulatory Visit: Payer: Medicare (Managed Care) | Admitting: Physical Therapy

## 2025-01-30 ENCOUNTER — Ambulatory Visit: Payer: Medicare (Managed Care)

## 2025-02-01 ENCOUNTER — Ambulatory Visit: Payer: Medicare (Managed Care) | Admitting: Physical Therapy

## 2025-02-06 ENCOUNTER — Ambulatory Visit: Payer: Medicare (Managed Care)

## 2025-02-08 ENCOUNTER — Ambulatory Visit: Payer: Medicare (Managed Care) | Admitting: Physical Therapy

## 2025-02-13 ENCOUNTER — Ambulatory Visit: Payer: Medicare (Managed Care)

## 2025-02-15 ENCOUNTER — Ambulatory Visit: Payer: Medicare (Managed Care) | Admitting: Physical Therapy

## 2025-02-20 ENCOUNTER — Ambulatory Visit: Payer: Medicare (Managed Care)

## 2025-02-21 ENCOUNTER — Other Ambulatory Visit: Payer: Medicare (Managed Care)

## 2025-02-21 ENCOUNTER — Ambulatory Visit: Payer: Medicare (Managed Care)

## 2025-02-22 ENCOUNTER — Ambulatory Visit: Payer: Medicare (Managed Care)

## 2025-02-27 ENCOUNTER — Encounter: Payer: Medicare (Managed Care) | Admitting: Family Medicine

## 2025-02-27 ENCOUNTER — Ambulatory Visit: Payer: Medicare (Managed Care)

## 2025-03-01 ENCOUNTER — Ambulatory Visit: Payer: Medicare (Managed Care)

## 2025-04-03 ENCOUNTER — Ambulatory Visit: Payer: Medicare (Managed Care) | Admitting: Dermatology

## 2025-04-23 ENCOUNTER — Other Ambulatory Visit: Payer: Medicare (Managed Care)

## 2025-05-01 ENCOUNTER — Ambulatory Visit: Payer: Medicare (Managed Care) | Admitting: Urology
